# Patient Record
Sex: Male | Born: 1960
Health system: Southern US, Community
[De-identification: ages and names within clinical notes are randomized; demographics above are authoritative.]

## PROBLEM LIST (undated history)

## (undated) DIAGNOSIS — R188 Other ascites: Secondary | ICD-10-CM

## (undated) DIAGNOSIS — Z8619 Personal history of other infectious and parasitic diseases: Secondary | ICD-10-CM

## (undated) DIAGNOSIS — K449 Diaphragmatic hernia without obstruction or gangrene: Secondary | ICD-10-CM

## (undated) DIAGNOSIS — R0600 Dyspnea, unspecified: Secondary | ICD-10-CM

## (undated) DIAGNOSIS — I8500 Esophageal varices without bleeding: Secondary | ICD-10-CM

## (undated) DIAGNOSIS — K4090 Unilateral inguinal hernia, without obstruction or gangrene, not specified as recurrent: Secondary | ICD-10-CM

## (undated) DIAGNOSIS — F1010 Alcohol abuse, uncomplicated: Secondary | ICD-10-CM

## (undated) DIAGNOSIS — F1910 Other psychoactive substance abuse, uncomplicated: Secondary | ICD-10-CM

## (undated) DIAGNOSIS — F419 Anxiety disorder, unspecified: Secondary | ICD-10-CM

## (undated) DIAGNOSIS — M1990 Unspecified osteoarthritis, unspecified site: Secondary | ICD-10-CM

## (undated) DIAGNOSIS — F32A Depression, unspecified: Secondary | ICD-10-CM

## (undated) DIAGNOSIS — F329 Major depressive disorder, single episode, unspecified: Secondary | ICD-10-CM

## (undated) DIAGNOSIS — K3189 Other diseases of stomach and duodenum: Secondary | ICD-10-CM

## (undated) DIAGNOSIS — D696 Thrombocytopenia, unspecified: Secondary | ICD-10-CM

## (undated) DIAGNOSIS — K7460 Unspecified cirrhosis of liver: Secondary | ICD-10-CM

## (undated) DIAGNOSIS — K766 Portal hypertension: Secondary | ICD-10-CM

## (undated) DIAGNOSIS — I8290 Acute embolism and thrombosis of unspecified vein: Secondary | ICD-10-CM

## (undated) DIAGNOSIS — K922 Gastrointestinal hemorrhage, unspecified: Secondary | ICD-10-CM

## (undated) HISTORY — DX: Acute embolism and thrombosis of unspecified vein: I82.90

## (undated) HISTORY — DX: Major depressive disorder, single episode, unspecified: F32.9

## (undated) HISTORY — DX: Depression, unspecified: F32.A

## (undated) HISTORY — DX: Unspecified cirrhosis of liver: K74.60

## (undated) HISTORY — DX: Anxiety disorder, unspecified: F41.9

---
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
## 1999-01-10 ENCOUNTER — Encounter (HOSPITAL_COMMUNITY): Admission: RE | Admit: 1999-01-10 | Discharge: 1999-02-11 | Payer: Self-pay | Admitting: Psychiatry

## 2003-10-20 ENCOUNTER — Emergency Department (HOSPITAL_COMMUNITY): Admission: EM | Admit: 2003-10-20 | Discharge: 2003-10-20 | Payer: Self-pay | Admitting: *Deleted

## 2006-03-01 ENCOUNTER — Emergency Department (HOSPITAL_COMMUNITY): Admission: EM | Admit: 2006-03-01 | Discharge: 2006-03-02 | Payer: Self-pay | Admitting: Emergency Medicine

## 2006-03-02 ENCOUNTER — Emergency Department (HOSPITAL_COMMUNITY): Admission: EM | Admit: 2006-03-02 | Discharge: 2006-03-02 | Payer: Self-pay | Admitting: Emergency Medicine

## 2006-03-02 ENCOUNTER — Inpatient Hospital Stay (HOSPITAL_COMMUNITY): Admission: RE | Admit: 2006-03-02 | Discharge: 2006-03-06 | Payer: Self-pay | Admitting: Psychiatry

## 2006-03-03 ENCOUNTER — Ambulatory Visit: Payer: Self-pay | Admitting: Psychiatry

## 2009-04-08 ENCOUNTER — Inpatient Hospital Stay (HOSPITAL_COMMUNITY): Admission: EM | Admit: 2009-04-08 | Discharge: 2009-04-14 | Payer: Self-pay | Admitting: Internal Medicine

## 2009-04-08 ENCOUNTER — Ambulatory Visit: Payer: Self-pay | Admitting: Interventional Radiology

## 2009-04-08 ENCOUNTER — Encounter: Payer: Self-pay | Admitting: Emergency Medicine

## 2009-04-08 ENCOUNTER — Encounter: Payer: Self-pay | Admitting: Infectious Disease

## 2009-05-16 ENCOUNTER — Ambulatory Visit: Payer: Self-pay | Admitting: Internal Medicine

## 2011-02-03 LAB — URINE MICROSCOPIC-ADD ON

## 2011-02-03 LAB — COMPREHENSIVE METABOLIC PANEL
ALT: 195 U/L — ABNORMAL HIGH (ref 0–53)
ALT: 290 U/L — ABNORMAL HIGH (ref 0–53)
AST: 171 U/L — ABNORMAL HIGH (ref 0–37)
AST: 272 U/L — ABNORMAL HIGH (ref 0–37)
Albumin: 2.7 g/dL — ABNORMAL LOW (ref 3.5–5.2)
Albumin: 3 g/dL — ABNORMAL LOW (ref 3.5–5.2)
Alkaline Phosphatase: 278 U/L — ABNORMAL HIGH (ref 39–117)
Alkaline Phosphatase: 421 U/L — ABNORMAL HIGH (ref 39–117)
BUN: 9 mg/dL (ref 6–23)
BUN: 12 mg/dL (ref 6–23)
CO2: 22 mEq/L (ref 19–32)
Calcium: 8.3 mg/dL — ABNORMAL LOW (ref 8.4–10.5)
Calcium: 8.5 mg/dL (ref 8.4–10.5)
Calcium: 8.7 mg/dL (ref 8.4–10.5)
Chloride: 101 mEq/L (ref 96–112)
Creatinine, Ser: 0.36 mg/dL — ABNORMAL LOW (ref 0.4–1.5)
Creatinine, Ser: 0.42 mg/dL (ref 0.4–1.5)
Creatinine, Ser: 0.4 mg/dL (ref 0.4–1.5)
GFR calc Af Amer: >60 mL/min (ref >60)
GFR calc Af Amer: >60 mL/min (ref >60)
GFR calc non Af Amer: >60 mL/min (ref >60)
Glucose, Bld: 90 mg/dL (ref 70–99)
Glucose, Bld: 88 mg/dL (ref 70–99)
Potassium: 3.5 mEq/L (ref 3.5–5.1)
Potassium: 3.3 mEq/L — ABNORMAL LOW (ref 3.5–5.1)
Sodium: 132 mEq/L — ABNORMAL LOW (ref 135–145)
Sodium: 136 mEq/L (ref 135–145)
Total Bilirubin: 2.5 mg/dL — ABNORMAL HIGH (ref 0.3–1.2)
Total Protein: 6.6 g/dL (ref 6.0–8.3)
Total Protein: 7.5 g/dL (ref 6.0–8.3)
Total Protein: 8 g/dL (ref 6.0–8.3)

## 2011-02-03 LAB — BODY FLUID CELL COUNT WITH DIFFERENTIAL
Eos, Fluid: 1 %
Lymphs, Fluid: 25 %
Monocyte-Macrophage-Serous Fluid: 48 % — ABNORMAL LOW (ref 50–90)
Neutrophil Count, Fluid: 26 % — ABNORMAL HIGH (ref 0–25)
Total Nucleated Cell Count, Fluid: 84 cu mm (ref 0–1000)

## 2011-02-03 LAB — ANAEROBIC CULTURE

## 2011-02-03 LAB — GRAM STAIN

## 2011-02-03 LAB — DIFFERENTIAL
Basophils Absolute: 0 10*3/uL (ref 0.0–0.1)
Basophils Relative: 0 % (ref 0–1)
Basophils Relative: 0 % (ref 0–1)
Eosinophils Absolute: 0 10*3/uL (ref 0.0–0.7)
Eosinophils Relative: 0 % (ref 0–5)
Lymphocytes Relative: 7 % — ABNORMAL LOW (ref 12–46)
Lymphocytes Relative: 11 % — ABNORMAL LOW (ref 12–46)
Lymphs Abs: 0.8 10*3/uL (ref 0.7–4.0)
Monocytes Absolute: 1.4 10*3/uL — ABNORMAL HIGH (ref 0.1–1.0)
Monocytes Relative: 12 % (ref 3–12)
Monocytes Relative: 12 % (ref 3–12)
Neutro Abs: 9.5 10*3/uL — ABNORMAL HIGH (ref 1.7–7.7)
Neutro Abs: 5.1 10*3/uL (ref 1.7–7.7)
Neutrophils Relative %: 81 % — ABNORMAL HIGH (ref 43–77)
Neutrophils Relative %: 76 % (ref 43–77)

## 2011-02-03 LAB — PROTIME-INR
INR: 1.7 — ABNORMAL HIGH (ref 0.00–1.49)
Prothrombin Time: 20.3 seconds — ABNORMAL HIGH (ref 11.6–15.2)

## 2011-02-03 LAB — ALBUMIN, FLUID (OTHER): Albumin, Fluid: <1 g/dL

## 2011-02-03 LAB — FUNGUS CULTURE W SMEAR: Fungal Smear: NONE SEEN

## 2011-02-03 LAB — CBC
HCT: 39.8 % (ref 39.0–52.0)
HCT: 42.5 % (ref 39.0–52.0)
Hemoglobin: 13.6 g/dL (ref 13.0–17.0)
Hemoglobin: 14.3 g/dL (ref 13.0–17.0)
MCHC: 34.1 g/dL (ref 30.0–36.0)
MCHC: 34.1 g/dL (ref 30.0–36.0)
MCHC: 33.6 g/dL (ref 30.0–36.0)
MCV: 93.1 fL (ref 78.0–100.0)
MCV: 94.1 fL (ref 78.0–100.0)
MCV: 93.1 fL (ref 78.0–100.0)
Platelets: 138 10*3/uL — ABNORMAL LOW (ref 150–400)
Platelets: 96 10*3/uL — ABNORMAL LOW (ref 150–400)
RBC: 4.28 MIL/uL (ref 4.22–5.81)
RDW: 17.1 % — ABNORMAL HIGH (ref 11.5–15.5)
RDW: 16.3 % — ABNORMAL HIGH (ref 11.5–15.5)
WBC: 11.7 10*3/uL — ABNORMAL HIGH (ref 4.0–10.5)
WBC: 6.1 10*3/uL (ref 4.0–10.5)

## 2011-02-03 LAB — AFB CULTURE WITH SMEAR (NOT AT ARMC): Acid Fast Smear: NONE SEEN

## 2011-02-03 LAB — APTT: aPTT: 44 seconds — ABNORMAL HIGH (ref 24–37)

## 2011-02-03 LAB — BODY FLUID CULTURE: Culture: NO GROWTH

## 2011-02-03 LAB — URINALYSIS, ROUTINE W REFLEX MICROSCOPIC
Hgb urine dipstick: NEGATIVE
Nitrite: NEGATIVE
Specific Gravity, Urine: 1.032 — ABNORMAL HIGH (ref 1.005–1.030)
Urobilinogen, UA: 1 mg/dL (ref 0.0–1.0)
pH: 6 (ref 5.0–8.0)

## 2011-02-03 LAB — PROTEIN, BODY FLUID: Total protein, fluid: <3 g/dL

## 2011-02-03 LAB — PATHOLOGIST SMEAR REVIEW

## 2011-02-03 LAB — LACTATE DEHYDROGENASE, PLEURAL OR PERITONEAL FLUID: LD, Fluid: 59 U/L — ABNORMAL HIGH (ref 3–23)

## 2011-02-03 LAB — HEPATITIS PANEL, ACUTE
HCV Ab: NEGATIVE
Hep A IgM: NEGATIVE
Hep B C IgM: NEGATIVE
Hepatitis B Surface Ag: NEGATIVE

## 2011-02-03 LAB — AFP TUMOR MARKER: AFP-Tumor Marker: 3.8 ng/mL (ref 0.0–8.0)

## 2011-02-03 LAB — AMMONIA: Ammonia: 35 umol/L (ref 11–35)

## 2011-02-03 LAB — GLUCOSE, SEROUS FLUID: Glucose, Fluid: 94 mg/dL

## 2011-02-03 LAB — HIV ANTIBODY (ROUTINE TESTING W REFLEX): HIV: NONREACTIVE

## 2011-03-11 NOTE — Discharge Summary (Signed)
NAMETYLON, KEMMERLING NO.:  [id number]   MEDICAL RECORD NO.:  [id number]          PATIENT TYPE:  INP   LOCATION:  5125                         FACILITY:  [facility]   PHYSICIAN:  Ruthy Dick, MD    DATE OF BIRTH:  [birth year]   DATE OF ADMISSION:  04/08/2009  DATE OF DISCHARGE:  04/14/2009                               DISCHARGE SUMMARY   REASON FOR ADMISSION:  Abdominal distention and weakness.   FINAL DISCHARGE DIAGNOSES:  1. Symptomatic ascites secondary to cirrhosis.  2. Cirrhosis of the liver, decompensated.  3. Alcohol abuse.  4. Tobacco abuse.  5. Depression.  6. Grade 2 esophageal varices.   CONSULT DURING THIS ADMISSION:  Gastroenterology consult with Dr. Loreta Ave.   PROCEDURES DONE DURING THIS ADMISSION:  1. Abdominal paracentesis x3 with a total of 14.5 L removed on 3      different occasions.  No evidence of spontaneous bacterial      peritonitis noted in the fluid.  2. Abdominal ultrasound, which was read as having showing cirrhosis      with a primary evaluation not being able to be excluded in any      case, alpha-fetoprotein was obtained, which showed no increase.      Gallbladder wall thickening was said to be related to cirrhosis.      No stones seen.  3. Second-degree varices was noted on EGD.   BRIEF HISTORY OF PRESENT ILLNESS AND HOSPITAL COURSE:  This is a 50-year-  old Caucasian gentleman with a history of significant alcoholism, who  came into the hospital with generalized weakness and abdominal  distention.  He was noted to be hypoxic on room air, requiring 4 L via  nasal cannula to keep his oxygen well.  Because of this, he was admitted  and managed with therapeutic paracentesis.  As noted above on 3  different occasions, this was done on the night of admission, he does  have a liter taken out by the admitting physician.  Later on, on June  14, he had about 8 liters removed and on June 15, a repeat paracentesis  yielded 6 L.  After  this procedure, the patient had significant relief,  and his abdomen became soft.  Discharging the patient became a problem  as the patient required assistance at home or in the skilled nursing  facility.  The patient did not qualify for skilled nursing facility  placement, and the family is very reluctant to take him back.  After  going on for a few days, the mother and father came over today and told  me that they are willing to take him back home without the help of home  health.  The patient stated that he is doing very well.  He is able to  get around with physical therapy and with minimal assistance.  He says  he has no complaint whatsoever today, no chest pain, no shortness of  breath, no abdominal pain, no nausea, no vomiting, no diarrhea, and no  constipation.  We are going to decrease the dose of diuretic a little  bit since his blood pressure was 92/62 this morning.   PHYSICAL EXAMINATION:  In a nutshell his vitals today are temperature  98.1, pulse 76, respirations 18, blood pressure 92/52, and saturating  97% on room air.  CHEST:  Clear to auscultation bilaterally.  ABDOMEN:  Soft and nontender.  EXTREMITIES:  No clubbing, no cyanosis, and no edema.  CARDIOVASCULAR:  Some first and second heart sounds only.  CENTRAL NERVOUS SYSTEM:  Nonfocal.   The patient has been advised strongly to quit both alcohol and tobacco  abuse, and he voices understanding.   DISCHARGE MEDICATIONS:  1. Zoloft 100 mg p.o. daily.  2. Lasix 40 mg p.o. daily.  3. Lactulose 30 mL p.o. daily.  4. Nadolol 20 mg p.o. daily.  5. Aldactone 50 mg p.o. daily.   Followup for this patient is being arranged by this Social Service.  Because of this, we would not been able to make any free calls to the  nurse, to [facility], or to [facility].  The patient's  preference actually is to go to [facility], but they are  closed during this weekend and that call cannot be made.   Because of  this, we are going to provide the patient with the phone number to both  clinics, and we will try and see if our social workers would follow up  with also trying to make calls for this patient.  But the primary  assignment at this time as discussed with the patient and as instructed  is for the patient to call both [facility] or the [facility] to  make appointments on Monday.  The appointment in either place should be  in 2-3 weeks.  It is recommended that the patient go home with home  health with physical therapy, occupational therapy, and a registered  nurse, who will check the patient's blood pressures and evaluate him  generally.   Time used for discharge planning is greater than 30 minutes.      Ruthy Dick, MD  Electronically Signed     GU/MEDQ  D:  04/14/2009  T:  04/15/2009  Job:  [id number]   cc:   [facility]

## 2011-03-11 NOTE — H&P (Signed)
Kenneth [name], Kenneth [name] NO.:  [id number]   MEDICAL RECORD NO.:  [id number]          [name] TYPE:  INP   LOCATION:  5125                         FACILITY:  [facility]   PHYSICIAN:  Acey Lav, MD  DATE OF BIRTH:  April 05, 1961   DATE OF ADMISSION:  04/08/2009  DATE OF DISCHARGE:                              HISTORY & PHYSICAL   CHIEF COMPLAINTS:  Abdominal distention and weakness.   HISTORY OF PRESENT ILLNESS:  Kenneth [name] is a 50 year old Caucasian  gentleman with a past medical history significant for alcoholism and  cirrhosis of Kenneth liver having been followed by a physician in an Urgent  Care.  He has had abdominal distention now for several years he states.  He has never had fluid removed for analysis and never had spontaneous  bacterial peritonitis as far as he knows.  He did stop drinking alcohol  approximately 8 days ago and apparently went through alcohol withdrawal  having suffered some hallucinations.  He claims not to have drunk  recently.  He was too weak to get out of his bed today in his house  where he lives with his wife and children and was brought with family's  encouragement to [facility] emergency room.  In Kenneth  emergency room he was found to be hypoxic on room air requiring 4 liters  via nasal cannula to keep him oxygenating well.  He had massive ascites  and his chest x-ray showed low lung volumes with bilateral atelectasis  and large ascites and likely also pleural fluids.  His labs were  reviewed and are discussed below.  Arrangements were made to have him  transferred to [facility] to be admitted to [facility] for management of his massive ascites and cirrhosis of Kenneth  liver.   PAST MEDICAL HISTORY:  1. Alcoholism.  2. Cirrhosis secondary to alcoholism.  3. Depression.   PAST SURGICAL HISTORY:  None.   FAMILY HISTORY:  Noncontributory.   SOCIAL HISTORY:  Kenneth [name] was a heavy drinker, only  quit drinking  approximately 7 days ago.  He also smokes tobacco.  He denies other  recreational drug use.  He is married, has a child.   REVIEW OF SYSTEMS:  Described in Kenneth history of present illness,  otherwise 10-point review of systems negative.  Specifically negative  for fevers, chills and weight loss.  He has a weight gain as described  above.   PHYSICAL EXAMINATION:  Blood pressure on arrival at [facility] floor  was 123/80, respirations 18, pulse 98, temperature 98, pulse ox was 91%  on 4 liters via nasal cannula.  Weight was 73 kg.  GENERAL:  Kenneth [name] has wasting of his facial muscles and of muscles  of his arms and legs.  He has massively distended abdomen obvious on  exam.  He is alert and oriented x4.  HEENT: As mentioned he has loss of muscles and loss of fat in his face.  Pupils equal, round, react to light.  Sclerae anicteric.  Oropharynx:  His tongue has slight coating  to it.  There is no erythema in Kenneth  posterior oropharynx.  NECK:  Supple.  CARDIOVASCULAR:  Regular rate and rhythm.  No murmurs, gallops or rubs.  He was slight tachycardia.  LUNGS:  Diminished breath sounds at Kenneth bases.  ABDOMEN:  Massively distended.  Shifting dullness to percussion with a  fluid wave.  LOWER EXTREMITIES:  With 2+ peripheral edema.  NEUROLOGIC EXAM:  Cranial nerves were grossly intact.  His strength in  upper extremities 5/5, lower extremities 4/5.   LABORATORY DATA:  Chest x-ray showing atelectasis bilaterally suspicious  for pleural effusion, also likely due to massive ascites.  Ammonia level 35, PTT 46.  PT 19.5.  Urinalysis:  Specific gravity  1.032, glucose negative, bilirubin moderate, 40+ ketones, negative  blood, 30 protein, negative nitrite and leukocyte esterase.  CMP:  Sodium 137, potassium 3.3, chloride 103, bicarb 28, BUN and creatinine  were 12 and 0.4, glucose 88, bilirubin 2.2, alkaline phosphatase 421,  AST, ALT were 468 and 428.  Albumin was 3.4.  CBC  with differential:  White count 6.7, hemoglobin 14.3, platelets 158, ANC was 5.1.   ASSESSMENT/PLAN:  This is a 50 year old Caucasian gentleman with  alcoholic cirrhosis of Kenneth liver with massive ascites.  1. Massive ascites:  I performed a paracentesis at Kenneth bedside as      described below in procedure note.  Fluid is being sent for cell      count, differential, LDH, albumin, protein, stat Gram stain,      culture, anaerobic culture, AFB culture, fungal culture, cytology.      Should he have evidence of SBP by members, I will start him on      ceftriaxone.  I have talked to Dr. Aurther Loft with Gastroenterology and      Dr. Elnoria Howard will see Kenneth [name] tomorrow.  Kenneth [name] will likely be      started on Aldactone and Lasix but I will not do this tonight nor      tomorrow until after I have gotten some fluid off him.  I am going      to give him 4 grams of albumin since I removed approximately 500 mL      of fluid.  See below.  2. Cirrhosis of Kenneth liver.  I will check an alpha fetoprotein.  We      will check an ultrasound of Kenneth abdomen.  It is essential Kenneth      [name] stay clean from alcohol.  3. Alcoholism.  I will put him on CIWA protocol for possible      withdrawal.  I will check an alcohol level.  I will give him a      banana bag of vitamins.  I may give them orally actually since he      can swallow fine.  4. Depression.  I will continue him on Kenneth sertraline.  5. Prophylax Kenneth [name] on heparin.   PROCEDURE NOTE:  Procedure:  Informed consent was performed.  Risks and  benefits of Kenneth procedure were explained to Kenneth [name].  Abdomen was  percussed to determine area of ascitic fluid after positioning [name]  at 45 degrees and then put up even to about 60 degrees seated.  Area was  prepped and draped in Kenneth usual sterile manner.  Lidocaine was  infiltrated into Kenneth skin using small needle, then larger needle used to  infiltrate lidocaine deeper.  Small 18-gauge syringe used to  aspirate  for fluid and ascitic  fluid was obtained and sent for cell count  differential and Kenneth above-listed studies.  Initially hooked up this  tubing to vacuum to try and remove fluid but had difficulty removing  more than 250 mL in Kenneth first draw.  Then used larger Finder needle to  obtain further fluid and obtained approximately in total 500 mL of fluid  with three different approaches in Kenneth midline area just approximately 4  or 5 cm below Kenneth umbilicus.  Estimated blood loss:  Minimal.  Complications:  None.  Fluid was sent for Kenneth studies described above.     Acey Lav, MD  Electronically Signed    CV/MEDQ  D:  04/08/2009  T:  04/08/2009  Job:  [id number]   cc:   Jordan Hawks. Elnoria Howard, MD

## 2011-03-11 NOTE — Consult Note (Signed)
NAMEEASON, HOUSMAN NO.:  [id number]   MEDICAL RECORD NO.:  [id number]          PATIENT TYPE:  INP   LOCATION:  5125                         FACILITY:  [facility]   PHYSICIAN:  Anselmo Rod, M.D.  DATE OF BIRTH:  [birth year]   DATE OF CONSULTATION:  04/09/2009  DATE OF DISCHARGE:                                 CONSULTATION   Patient is unassigned.   REASON FOR CONSULTATION:  Ascites, ?Hepatic encephalopathy.   ASSESSMENT:  1. End-stage liver disease secondary to alcohol abuse with hepatic      encephalopathy, which seems to be improving.  2. Massive ascites, status post paracentesis of 80 liters of fluid      today.  3. Depression.  4. Malnutrition.  5. Fatty liver.  6. Gallbladder/sludge versus stones.   RECOMMENDATIONS:  1. Check pre-albumin levels.  2. Alcohol rehab.  3. EGD to rule out varices.  4. A low 2 gm sodium.  5. Diuretics to be titrated to the patient's response.   DISCUSSION:  Mr. Prudencio Velazco is a 50 year old white male admitted to  [facility] to the [facility] on 04/08/09 with  generalized abdominal distention and weakness.  He has a longstanding  history of alcoholism with cirrhosis of the liver referred by a  physician at the urgent care.  He had worsening abdominal distention for  several years but as his symptoms deteriorated over the last week, he  presented to the hospital over the weekend for further evaluation.  He  was seen at [facility] and then transferred to [facility] for treatment and followup.   PAST MEDICAL HISTORY:  See list above.   PAST SURGICAL HISTORY:  Patient has not had any surgeries in the past.   ALLERGIES:  No known drug allergies.   MEDICATIONS IN THE HOSPITAL:  1. Lactulose.  2. Ativan p.r.n., which is presently on hold.  3. KCL supplements.  4. Aldactone.  5. Vitamin B1.  6. Multivitamins.   SOCIAL HISTORY:  He is unemployed.  He has been drinking  heavily.  He  cannot quantify for me as to the amount of alcohol he has had in the  recent past.  He stopped drinking approximately 7 days ago.  He also  smokes cigarettes.  He denies the use of recreational drugs.  He is  married with 1 child.  He is presently unemployed.  He used to work as a  [employer]al architect.   FAMILY HISTORY:  Noncontributory.   REVIEW OF SYSTEMS:  1. Depression.  2. Abdominal distention.  3. Weakness and lethargy.  4. No nausea, vomiting, diarrhea, or constipation.   PHYSICAL EXAMINATION:  A middle-aged white male who looks older than his  stated age in no acute distress, sitting on the side of the bed, over  his dinner plate, not eating much.  Temperature 98.6, blood pressure 105/59, respiratory rate 20 per minute,  pulse 99 per minute.  HEENT:  Atraumatic and normocephalic head.  NECK:  Supple.  CHEST:  Clear to auscultation.  HEART:  S1 and S2  regular.  ABDOMEN:  Soft, nondistended, nontender, with decreased bowel sounds.  No hepatosplenomegaly.  There is mild edema of the extremities.   Laboratory evaluation revealed white count of 11.7 with a hemoglobin of  13.6 and platelet count of 138.  PT is elevated at 1.7 with a PTT of  20.3.  Sodium 132, potassium 3.5, chloride 101, CO2 22, glucose 90, BUN  9, creatinine 0.4, total bilirubin 2.5, alkaline phosphatase 278.  AST  272.  ALT 290.  Total protein 7.5.  Albumin 2.7.  Calcium 8.3.  The  sciatic fluid consists of a few benign mesothelial cells and  inflammatory background.  AFB studies are negative.  There is no yeast  or fungus noted.  Alpha fetoprotein level is 3.8 ng/ml with normal being  from 0 to 8.0.  Ammonia level drawn yesterday was normal at 35 mm/liter.   Ultrasound of the abdomen done earlier today revealed some sludge in the  gallbladder versus stones and splenomegaly.  There is a moderate amount  of ascites.  There is gallbladder thickening noted.   PLAN:  As above.  Further recommendations  to be made in followup.      Anselmo Rod, M.D.  Electronically Signed     JNM/MEDQ  D:  04/09/2009  T:  04/09/2009  Job:  [id number]

## 2011-03-14 NOTE — Discharge Summary (Signed)
[name], Kenneth NO.:  [id number]   MEDICAL RECORD NO.:  [id number]          PATIENT TYPE:  IPS   LOCATION:  0305                          [facility]ACILITY[facility]  [facility]   PHYSICIAN:  Anselm Jungling, MD  DATE OF BIRTH:  Dec 17, 1960   DATE OF ADMISSION:  03/02/2006  DATE OF DISCHARGE:  03/06/2006                                 DISCHARGE SUMMARY   IDENTIFYING DATA/REASON FOR ADMISSION:  The patient is a 50 year old  separated white male admitted for alcohol detoxification.  He had had  inpatient chemical dependency treatment at [facility] in the year 2000,  but remained sober only five weeks.  At this time, his primary care  physician urged him into alcohol detoxification and rehabilitation again.  He came to [place] without any significant psychiatric history.  Please refer to  the admission note for further details pertaining to the symptoms,  circumstances and history that led to his hospitalization.   INITIAL DIAGNOSTIC IMPRESSION:  He was given an initial AXIS I diagnosis of  alcohol dependence.   MEDICAL/LABORATORY:  The patient was medically and physically assessed by  the psychiatric nurse practitioner.  For symptoms of GERD, he was given  Protonix 40 mg daily.  There were no significant medical issues during this  brief inpatient psychiatric stay.   HOSPITAL COURSE:  The patient was admitted to the adult inpatient  psychiatric service and placed on a Librium withdrawal protocol.  He  tolerated this well, and had minimal symptoms during his stay.  He was a  good participant in the treatment program and attended therapeutic groups  and activities, including AA meetings.   A family meeting was offered the patient, but he indicated that he preferred  not to do this, as he saw his family as being the root of my problems.   At the time of discharge, the patient was not tremulous, was on minimal  doses of Librium, and indicated a strong commitment to attending AA  regular  meetings.   AFTERCARE:  The patient was to follow-up with Alcoholics Anonymous.  He had  met individuals from a local [place] [facility] and had made  contacts with them during his inpatient stay.   DISCHARGE MEDICATIONS:  He had been started on a trial of Campral 666 mg  t.i.d. which was well-tolerated.  He was given a 30-day prescription for  this.   FOLLOW UP:  He was to follow-up with his usual medical physician, his  primary care doctor regarding continued follow-up of Campral therapy.   DISCHARGE DIAGNOSES:  AXIS I:  Alcohol dependence, early remission.  AXIS II:  Deferred.  AXIS III:  History of gastroesophageal reflux disease.  AXIS IV:  Stressors:  Severe.  AXIS V:  GAF on discharge 65.           ______________________________  Anselm Jungling, MD  Electronically Signed     SPB/MEDQ  D:  03/06/2006  T:  03/07/2006  Job:  [id number]

## 2011-11-21 ENCOUNTER — Ambulatory Visit: Payer: Self-pay

## 2011-11-21 DIAGNOSIS — I10 Essential (primary) hypertension: Secondary | ICD-10-CM

## 2011-11-21 DIAGNOSIS — F1010 Alcohol abuse, uncomplicated: Secondary | ICD-10-CM

## 2011-12-23 ENCOUNTER — Encounter: Payer: Self-pay | Admitting: Physician Assistant

## 2011-12-23 ENCOUNTER — Telehealth: Payer: Self-pay

## 2011-12-23 NOTE — Telephone Encounter (Signed)
Called pt who stated letter should be fine for company instead of release form. Letter had been written and left to be faxed earlier today, but re-faxed letter and received confirmation that went through.

## 2011-12-23 NOTE — Telephone Encounter (Signed)
Below is an Armed forces technical officer the patient sent through our website.  The following email was submitted to your website from [place] a. 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  [phone].  This is for employment with [employer], [place], [place].  This needs to be done by 12pm on tuesday feb.26, 2013 to secure my employment.  Please call me early a.m. if there is any problem or delay with this request.  thank you, Kenneth [name]. [phone] Date of birth: 1961/04/20

## 2011-12-23 NOTE — Telephone Encounter (Signed)
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 Needs release ASAP, works for [employer] and is getting assignments at [employer].

## 2011-12-23 NOTE — Telephone Encounter (Signed)
Benny Lennert wrote letter, will fax

## 2012-01-15 ENCOUNTER — Ambulatory Visit: Payer: Self-pay | Admitting: Emergency Medicine

## 2012-01-15 VITALS — BP 133/84 | HR 60 | Temp 98.8°F | Resp 16 | Ht 67.0 in | Wt 186.0 lb

## 2012-01-15 DIAGNOSIS — R5383 Other fatigue: Secondary | ICD-10-CM

## 2012-01-15 DIAGNOSIS — R5381 Other malaise: Secondary | ICD-10-CM

## 2012-01-15 DIAGNOSIS — G4710 Hypersomnia, unspecified: Secondary | ICD-10-CM

## 2012-01-15 LAB — POCT CBC
MCH, POC: 31.2 pg (ref 27–31.2)
MCV: 93.7 fL (ref 80–97)
MID (cbc): 0.8 (ref 0–0.9)
POC LYMPH PERCENT: 16.9 %L (ref 10–50)
Platelet Count, POC: 106 10*3/uL — AB (ref 142–424)
RDW, POC: 15 %
WBC: 8.7 10*3/uL (ref 4.6–10.2)
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
## 2012-01-16 ENCOUNTER — Telehealth: Payer: Self-pay

## 2012-01-16 LAB — COMPREHENSIVE METABOLIC PANEL
CO2: 27 mEq/L (ref 19–32)
Creat: 0.76 mg/dL (ref 0.50–1.35)
Glucose, Bld: 89 mg/dL (ref 70–99)
Total Bilirubin: 0.7 mg/dL (ref 0.3–1.2)
Total Protein: 6.9 g/dL (ref 6.0–8.3)

## 2012-01-16 NOTE — Telephone Encounter (Signed)
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 WOULD LIKE TO HAVE [place] FAX TO [phone] ATTN: LYNN AND YOU MAY REACH PT AT [phone]

## 2012-01-17 NOTE — Telephone Encounter (Signed)
Advised pt that letter was faxed

## 2012-01-21 ENCOUNTER — Telehealth: Payer: Self-pay | Admitting: Family Medicine

## 2012-01-21 NOTE — Telephone Encounter (Signed)
Called patient per Dr. Deforest Hoyles request and [facility].    He would like patient to come in for EKG only at no charge.  Dr. Cleta Alberts wanted to rule out cause of his symptoms being related to Celexa dose.  Please let Dr. Cleta Alberts know if patient is willing to come in and he will put in order.

## 2012-01-23 NOTE — Telephone Encounter (Signed)
LMOM to CB. 

## 2012-01-26 NOTE — Telephone Encounter (Signed)
LMOM to RTC for EKG per Dr. Deforest Hoyles request

## 2012-01-28 ENCOUNTER — Ambulatory Visit (INDEPENDENT_AMBULATORY_CARE_PROVIDER_SITE_OTHER): Payer: Self-pay | Admitting: Emergency Medicine

## 2012-01-28 VITALS — BP 112/73 | HR 65 | Temp 98.0°F | Resp 16 | Ht 67.0 in | Wt 182.4 lb

## 2012-01-28 DIAGNOSIS — G4710 Hypersomnia, unspecified: Secondary | ICD-10-CM

## 2012-01-28 NOTE — Progress Notes (Signed)
  Subjective:    Patient ID: Kenneth [name], male    DOB: 1961/01/21, 51 y.o.   MRN: [id number]  HPI patient called and requested to come in for an EKG. He was asked to come in because he is on Celexa and has been having narcolepsy-type symptoms    Review of Systems     Objective:   Physical Exam  EKG shows a normal sinus rhythm with no evidence of QT prolongation      Assessment & Plan:  Advised to proceed with workup for narcolepsy

## 2012-03-06 ENCOUNTER — Telehealth: Payer: Self-pay

## 2012-03-06 NOTE — Telephone Encounter (Signed)
THIS MESSAGE PROBABLY NEEDS TO GO TO MAUDIA  PT SAYS THAT PPW WAS SENT OVER FROM HIS JOB TO COMPLETE FOR HIS BEING OUT OF WORK. HE CAME BY AND SIGNED A RELEASE. HE WANTS TO KNOW THE STATUS OF HIS PPW. HE WORKS FOR [employer].   [phone]

## 2012-05-27 ENCOUNTER — Other Ambulatory Visit: Payer: Self-pay | Admitting: Family Medicine

## 2012-09-17 ENCOUNTER — Telehealth: Payer: Self-pay

## 2012-09-17 NOTE — Telephone Encounter (Signed)
He has to see the sleep specialist first.

## 2012-09-17 NOTE — Telephone Encounter (Signed)
PT WOULD LIKE TO KNOW IF DR.DAUB WOULD APPROVE FOR HIM TO HAVE A 3 MONTH DOT CARD INSTEAD OF DENYING IT ALL TOGETHER. BEST# [phone]

## 2012-09-18 NOTE — Telephone Encounter (Signed)
Left message for patient to return call.

## 2012-09-20 NOTE — Telephone Encounter (Signed)
Pt called looking for status, left message in billing voicemail in error. Forwarding to clinical pool

## 2012-09-20 NOTE — Telephone Encounter (Signed)
LMOM to CB. 

## 2012-09-21 NOTE — Telephone Encounter (Signed)
Patient advised.

## 2012-12-14 ENCOUNTER — Other Ambulatory Visit: Payer: Self-pay | Admitting: Family Medicine

## 2014-08-31 ENCOUNTER — Emergency Department (HOSPITAL_COMMUNITY): Payer: Self-pay

## 2014-08-31 ENCOUNTER — Inpatient Hospital Stay (HOSPITAL_COMMUNITY)
Admission: EM | Admit: 2014-08-31 | Discharge: 2014-09-03 | DRG: 369 | Disposition: A | Discharge status: Home / Self Care | Payer: Self-pay | Attending: Internal Medicine | Admitting: Internal Medicine

## 2014-08-31 ENCOUNTER — Encounter (HOSPITAL_COMMUNITY): Payer: Self-pay | Admitting: *Deleted

## 2014-08-31 DIAGNOSIS — D72819 Decreased white blood cell count, unspecified: Secondary | ICD-10-CM | POA: Diagnosis present

## 2014-08-31 DIAGNOSIS — R7989 Other specified abnormal findings of blood chemistry: Secondary | ICD-10-CM | POA: Diagnosis present

## 2014-08-31 DIAGNOSIS — I8501 Esophageal varices with bleeding: Principal | ICD-10-CM | POA: Diagnosis present

## 2014-08-31 DIAGNOSIS — F17210 Nicotine dependence, cigarettes, uncomplicated: Secondary | ICD-10-CM | POA: Diagnosis present

## 2014-08-31 DIAGNOSIS — K7030 Alcoholic cirrhosis of liver without ascites: Secondary | ICD-10-CM

## 2014-08-31 DIAGNOSIS — R000 Tachycardia, unspecified: Secondary | ICD-10-CM | POA: Diagnosis present

## 2014-08-31 DIAGNOSIS — I8500 Esophageal varices without bleeding: Secondary | ICD-10-CM | POA: Diagnosis present

## 2014-08-31 DIAGNOSIS — D696 Thrombocytopenia, unspecified: Secondary | ICD-10-CM | POA: Diagnosis present

## 2014-08-31 DIAGNOSIS — Z79899 Other long term (current) drug therapy: Secondary | ICD-10-CM

## 2014-08-31 DIAGNOSIS — F10939 Alcohol use, unspecified with withdrawal, unspecified: Secondary | ICD-10-CM

## 2014-08-31 DIAGNOSIS — K766 Portal hypertension: Secondary | ICD-10-CM | POA: Diagnosis present

## 2014-08-31 DIAGNOSIS — I959 Hypotension, unspecified: Secondary | ICD-10-CM | POA: Diagnosis present

## 2014-08-31 DIAGNOSIS — R109 Unspecified abdominal pain: Secondary | ICD-10-CM

## 2014-08-31 DIAGNOSIS — F1290 Cannabis use, unspecified, uncomplicated: Secondary | ICD-10-CM | POA: Diagnosis present

## 2014-08-31 DIAGNOSIS — K449 Diaphragmatic hernia without obstruction or gangrene: Secondary | ICD-10-CM | POA: Diagnosis present

## 2014-08-31 DIAGNOSIS — K922 Gastrointestinal hemorrhage, unspecified: Secondary | ICD-10-CM

## 2014-08-31 DIAGNOSIS — F10239 Alcohol dependence with withdrawal, unspecified: Secondary | ICD-10-CM | POA: Diagnosis present

## 2014-08-31 DIAGNOSIS — F1010 Alcohol abuse, uncomplicated: Secondary | ICD-10-CM | POA: Diagnosis present

## 2014-08-31 DIAGNOSIS — D62 Acute posthemorrhagic anemia: Secondary | ICD-10-CM | POA: Diagnosis present

## 2014-08-31 DIAGNOSIS — K8020 Calculus of gallbladder without cholecystitis without obstruction: Secondary | ICD-10-CM | POA: Diagnosis present

## 2014-08-31 DIAGNOSIS — K7031 Alcoholic cirrhosis of liver with ascites: Secondary | ICD-10-CM | POA: Insufficient documentation

## 2014-08-31 DIAGNOSIS — K3189 Other diseases of stomach and duodenum: Secondary | ICD-10-CM | POA: Diagnosis present

## 2014-08-31 HISTORY — DX: Other psychoactive substance abuse, uncomplicated: F19.10

## 2014-08-31 HISTORY — DX: Other diseases of stomach and duodenum: K31.89

## 2014-08-31 HISTORY — DX: Gastrointestinal hemorrhage, unspecified: K92.2

## 2014-08-31 HISTORY — DX: Thrombocytopenia, unspecified: D69.6

## 2014-08-31 HISTORY — DX: Alcohol abuse, uncomplicated: F10.10

## 2014-08-31 HISTORY — DX: Esophageal varices without bleeding: I85.00

## 2014-08-31 HISTORY — DX: Personal history of other infectious and parasitic diseases: Z86.19

## 2014-08-31 HISTORY — DX: Diaphragmatic hernia without obstruction or gangrene: K44.9

## 2014-08-31 HISTORY — DX: Portal hypertension: K76.6

## 2014-08-31 LAB — COMPREHENSIVE METABOLIC PANEL
ALBUMIN: 2.8 g/dL — AB (ref 3.5–5.2)
ALT: 43 U/L (ref 0–53)
AST: 103 U/L — AB (ref 0–37)
Alkaline Phosphatase: 109 U/L (ref 39–117)
Anion gap: 12 (ref 5–15)
BUN: 18 mg/dL (ref 6–23)
CALCIUM: 7.9 mg/dL — AB (ref 8.4–10.5)
CHLORIDE: 107 meq/L (ref 96–112)
CO2: 23 mEq/L (ref 19–32)
CREATININE: 0.41 mg/dL — AB (ref 0.50–1.35)
GFR calc Af Amer: >90 mL/min (ref >90)
GFR calc non Af Amer: >90 mL/min (ref >90)
Glucose, Bld: 123 mg/dL — ABNORMAL HIGH (ref 70–99)
Potassium: 4.5 mEq/L (ref 3.7–5.3)
Sodium: 142 mEq/L (ref 137–147)
TOTAL PROTEIN: 6.3 g/dL (ref 6.0–8.3)
Total Bilirubin: 1.2 mg/dL (ref 0.3–1.2)

## 2014-08-31 LAB — BASIC METABOLIC PANEL
Anion gap: 20 — ABNORMAL HIGH (ref 5–15)
BUN: 20 mg/dL (ref 6–23)
CALCIUM: 8.6 mg/dL (ref 8.4–10.5)
CO2: 20 mEq/L (ref 19–32)
Chloride: 101 mEq/L (ref 96–112)
Creatinine, Ser: 0.41 mg/dL — ABNORMAL LOW (ref 0.50–1.35)
GLUCOSE: 124 mg/dL — AB (ref 70–99)
POTASSIUM: 4 meq/L (ref 3.7–5.3)
SODIUM: 141 meq/L (ref 137–147)

## 2014-08-31 LAB — CBC WITH DIFFERENTIAL/PLATELET
BASOS ABS: 0 10*3/uL (ref 0.0–0.1)
Basophils Relative: 0 % (ref 0–1)
EOS ABS: 0 10*3/uL (ref 0.0–0.7)
Eosinophils Relative: 0 % (ref 0–5)
HCT: 26.2 % — ABNORMAL LOW (ref 39.0–52.0)
Hemoglobin: 8.9 g/dL — ABNORMAL LOW (ref 13.0–17.0)
LYMPHS ABS: 0.4 10*3/uL — AB (ref 0.7–4.0)
Lymphocytes Relative: 10 % — ABNORMAL LOW (ref 12–46)
MCH: 35 pg — ABNORMAL HIGH (ref 26.0–34.0)
MCHC: 34 g/dL (ref 30.0–36.0)
MCV: 103.1 fL — AB (ref 78.0–100.0)
MONO ABS: 0.6 10*3/uL (ref 0.1–1.0)
MONOS PCT: 16 % — AB (ref 3–12)
Neutro Abs: 2.8 10*3/uL (ref 1.7–7.7)
Neutrophils Relative %: 74 % (ref 43–77)
PLATELETS: 31 10*3/uL — AB (ref 150–400)
RBC: 2.54 MIL/uL — ABNORMAL LOW (ref 4.22–5.81)
RDW: 12.3 % (ref 11.5–15.5)
WBC: 3.8 10*3/uL — AB (ref 4.0–10.5)

## 2014-08-31 LAB — PROTIME-INR
INR: 1.38 (ref 0.00–1.49)
INR: 1.48 (ref 0.00–1.49)
Prothrombin Time: 17.1 seconds — ABNORMAL HIGH (ref 11.6–15.2)
Prothrombin Time: 18.1 seconds — ABNORMAL HIGH (ref 11.6–15.2)

## 2014-08-31 LAB — ETHANOL: ALCOHOL ETHYL (B): 38 mg/dL — AB (ref 0–11)

## 2014-08-31 LAB — TYPE AND SCREEN
ABO/RH(D): A NEG
ANTIBODY SCREEN: NEGATIVE

## 2014-08-31 LAB — TROPONIN I: Troponin I: <0.3 ng/mL (ref <0.30)

## 2014-08-31 LAB — POC OCCULT BLOOD, ED: Fecal Occult Bld: POSITIVE — AB

## 2014-08-31 LAB — MAGNESIUM: Magnesium: 2 mg/dL (ref 1.5–2.5)

## 2014-08-31 LAB — HEPATIC FUNCTION PANEL
ALBUMIN: 3.2 g/dL — AB (ref 3.5–5.2)
ALT: 51 U/L (ref 0–53)
AST: 120 U/L — AB (ref 0–37)
Alkaline Phosphatase: 139 U/L — ABNORMAL HIGH (ref 39–117)
BILIRUBIN DIRECT: 0.4 mg/dL — AB (ref 0.0–0.3)
Indirect Bilirubin: 0.7 mg/dL (ref 0.3–0.9)
Total Bilirubin: 1.1 mg/dL (ref 0.3–1.2)
Total Protein: 7.5 g/dL (ref 6.0–8.3)

## 2014-08-31 LAB — CBC
HCT: 33.2 % — ABNORMAL LOW (ref 39.0–52.0)
HEMOGLOBIN: 11.2 g/dL — AB (ref 13.0–17.0)
MCH: 35 pg — AB (ref 26.0–34.0)
MCHC: 33.7 g/dL (ref 30.0–36.0)
MCV: 103.8 fL — ABNORMAL HIGH (ref 78.0–100.0)
Platelets: 74 10*3/uL — ABNORMAL LOW (ref 150–400)
RBC: 3.2 MIL/uL — ABNORMAL LOW (ref 4.22–5.81)
RDW: 12.3 % (ref 11.5–15.5)
WBC: 8.1 10*3/uL (ref 4.0–10.5)

## 2014-08-31 LAB — MRSA PCR SCREENING: MRSA by PCR: NEGATIVE

## 2014-08-31 LAB — PHOSPHORUS: Phosphorus: 3.5 mg/dL (ref 2.3–4.6)

## 2014-08-31 LAB — ABO/RH: ABO/RH(D): A NEG

## 2014-08-31 LAB — OCCULT BLOOD GASTRIC / DUODENUM (SPECIMEN CUP)
Occult Blood, Gastric: POSITIVE — AB
pH, Gastric: 1

## 2014-08-31 LAB — AMMONIA: Ammonia: 43 umol/L (ref 11–60)

## 2014-08-31 LAB — I-STAT CG4 LACTIC ACID, ED: LACTIC ACID, VENOUS: 3.99 mmol/L — AB (ref 0.5–2.2)

## 2014-08-31 MED ORDER — THIAMINE HCL 100 MG/ML IJ SOLN
100.0000 mg | Freq: Every day | INTRAMUSCULAR | Status: DC
  Administered 2014-08-31 – 2014-09-01 (×2): 100 mg via INTRAVENOUS
  Filled 2014-08-31 (×3): qty 2

## 2014-08-31 MED ORDER — SODIUM CHLORIDE 0.9 % IV SOLN
INTRAVENOUS | Status: DC
  Administered 2014-08-31: via INTRAVENOUS
  Administered 2014-09-01: 75 mL/h via INTRAVENOUS
  Administered 2014-09-02 (×2): via INTRAVENOUS
  Administered 2014-09-03: 75 mL/h via INTRAVENOUS

## 2014-08-31 MED ORDER — SODIUM CHLORIDE 0.9 % IV SOLN
8.0000 mg/h | INTRAVENOUS | Status: DC
  Administered 2014-08-31 – 2014-09-01 (×3): 8 mg/h via INTRAVENOUS
  Filled 2014-08-31 (×6): qty 80

## 2014-08-31 MED ORDER — PNEUMOCOCCAL VAC POLYVALENT 25 MCG/0.5ML IJ INJ
0.5000 mL | INJECTION | INTRAMUSCULAR | Status: AC
  Administered 2014-09-01: 0.5 mL via INTRAMUSCULAR
  Filled 2014-08-31 (×2): qty 0.5

## 2014-08-31 MED ORDER — PANTOPRAZOLE SODIUM 40 MG IV SOLR: 40.0000 mg | Freq: Two times a day (BID) | INTRAVENOUS | Status: DC

## 2014-08-31 MED ORDER — SODIUM CHLORIDE 0.9 % IV SOLN
80.0000 mg | Freq: Once | INTRAVENOUS | Status: AC
  Administered 2014-08-31: 80 mg via INTRAVENOUS
  Filled 2014-08-31: qty 80

## 2014-08-31 MED ORDER — INFLUENZA VAC SPLIT QUAD 0.5 ML IM SUSY
0.5000 mL | PREFILLED_SYRINGE | INTRAMUSCULAR | Status: DC
  Filled 2014-08-31 (×2): qty 0.5

## 2014-08-31 MED ORDER — LORAZEPAM 2 MG/ML IJ SOLN
2.0000 mg | INTRAMUSCULAR | Status: DC | PRN
  Administered 2014-08-31: 3 mg via INTRAVENOUS
  Administered 2014-09-01 – 2014-09-02 (×6): 2 mg via INTRAVENOUS
  Filled 2014-08-31 (×2): qty 1
  Filled 2014-08-31: qty 2
  Filled 2014-08-31 (×5): qty 1

## 2014-08-31 MED ORDER — SODIUM CHLORIDE 0.9 % IV BOLUS (SEPSIS)
1000.0000 mL | Freq: Once | INTRAVENOUS | Status: AC
  Administered 2014-08-31: 1000 mL via INTRAVENOUS

## 2014-08-31 MED ORDER — CETYLPYRIDINIUM CHLORIDE 0.05 % MT LIQD
7.0000 mL | Freq: Two times a day (BID) | OROMUCOSAL | Status: DC
  Administered 2014-09-01 – 2014-09-03 (×5): 7 mL via OROMUCOSAL

## 2014-08-31 MED ORDER — DEXTROSE 5 % IV SOLN
1.0000 g | INTRAVENOUS | Status: DC
  Administered 2014-08-31 – 2014-09-02 (×3): 1 g via INTRAVENOUS
  Filled 2014-08-31 (×4): qty 10

## 2014-08-31 MED ORDER — FOLIC ACID 5 MG/ML IJ SOLN
1.0000 mg | Freq: Every day | INTRAMUSCULAR | Status: DC
  Administered 2014-08-31 – 2014-09-01 (×2): 1 mg via INTRAVENOUS
  Filled 2014-08-31 (×4): qty 0.2

## 2014-08-31 MED ORDER — OCTREOTIDE LOAD VIA INFUSION
50.0000 ug | Freq: Once | INTRAVENOUS | Status: AC
  Administered 2014-08-31: 50 ug via INTRAVENOUS
  Filled 2014-08-31: qty 25

## 2014-08-31 MED ORDER — SODIUM CHLORIDE 0.9 % IV SOLN
50.0000 ug/h | INTRAVENOUS | Status: DC
  Administered 2014-08-31 – 2014-09-01 (×3): 50 ug/h via INTRAVENOUS
  Filled 2014-08-31 (×6): qty 1

## 2014-08-31 NOTE — ED Notes (Signed)
Patient placed on O2 Quinn due to admin of Ativan

## 2014-08-31 NOTE — ED Notes (Signed)
Patient is a heavy drinker--unable to stop Patient drank 10-12 beers yesterday, stopped drinking at 0200 Patient began to vomit around 1200--thinks about x 5 total Patient called EMS due to blood in vomit

## 2014-08-31 NOTE — ED Notes (Signed)
Patient back from radiology.

## 2014-08-31 NOTE — ED Provider Notes (Signed)
CSN: [id number]     Arrival date & time 08/31/14  1526 History   First MD Initiated Contact with Patient 08/31/14 1533     Chief Complaint  Patient presents with  . Hematemesis  . Abdominal Pain     (Consider location/radiation/quality/duration/timing/severity/associated sxs/prior Treatment) HPI Comments: Patient presents to the ER for evaluation of vomiting blood. Patient reports that he had onset of upper abdominal pain and discomfort with nausea and vomiting earlier today. He has had 4 or 5 episodes of vomiting blood. Patient reports a previous history of liver cirrhosis secondary to alcohol intake. He has never had endoscopy, unknown if he has varices. Patient denies chest pain and shortness of breath. He has not had any fever. Patient reports that the last time he drank was approximately 2 AM. He has started to feel very shaky, does report tremors with withdrawal, no history of seizures.  Patient is a 53 y.o. male presenting with abdominal pain.  Abdominal Pain Associated symptoms: nausea and vomiting     Past Medical History  Diagnosis Date  . Alcohol abuse   . Polysubstance abuse   . [facility]     age 53 or 89   Past Surgical History  Procedure Laterality Date  . Eye surgery     History reviewed. No pertinent family history. History  Substance Use Topics  . Smoking status: Current Every Day Smoker -- 1.00 packs/day for 2 years    Types: Cigarettes  . Smokeless tobacco: Never Used  . Alcohol Use: Yes     Comment: 14 beers per day    Review of Systems  Gastrointestinal: Positive for nausea, vomiting and abdominal pain.       Hematemesis  Neurological: Positive for tremors.  All other systems reviewed and are negative.     Allergies  Review of patient's allergies indicates no known allergies.  Home Medications   Prior to Admission medications   Medication Sig Start Date End Date Taking? Authorizing Provider  Multiple Vitamin  (MULTIVITAMIN WITH MINERALS) TABS tablet Take 0.5 tablets by mouth daily.   Yes Historical Provider, MD  citalopram (CELEXA) 40 MG tablet Take 40 mg by mouth daily.    Historical Provider, MD  furosemide (LASIX) 20 MG tablet TAKE 1/2 TABLET EVERY DAY 05/27/12   Chelle S Jeffery, PA-C  nadolol (CORGARD) 20 MG tablet Take 20 mg by mouth daily.    Historical Provider, MD   BP 114/70 mmHg  Pulse 96  Temp(Src) 98.1 F (36.7 C) (Oral)  Resp 17  SpO2 98% Physical Exam  Constitutional: He is oriented to person, place, and time. He appears well-developed and well-nourished. No distress.  HENT:  Head: Normocephalic and atraumatic.  Right Ear: Hearing normal.  Left Ear: Hearing normal.  Nose: Nose normal.  Mouth/Throat: Oropharynx is clear and moist and mucous membranes are normal.  Eyes: Conjunctivae and EOM are normal. Pupils are equal, round, and reactive to light.  Neck: Normal range of motion. Neck supple.  Cardiovascular: Regular rhythm, S1 normal and S2 normal.  Exam reveals no gallop and no friction rub.   No murmur heard. Pulmonary/Chest: Effort normal and breath sounds normal. No respiratory distress. He exhibits no tenderness.  Abdominal: Soft. Normal appearance and bowel sounds are normal. There is no hepatosplenomegaly. There is tenderness in the epigastric area. There is no rebound, no guarding, no tenderness at McBurney's point and negative Murphy's sign. No hernia.  Musculoskeletal: Normal range of motion.  Neurological: He is alert  and oriented to person, place, and time. He has normal strength. He displays tremor. No cranial nerve deficit or sensory deficit. Coordination normal. GCS eye subscore is 4. GCS verbal subscore is 5. GCS motor subscore is 6.  Skin: Skin is warm, dry and intact. No rash noted. No cyanosis.  Psychiatric: His speech is normal and behavior is normal. Thought content normal. His mood appears anxious. He expresses no suicidal plans.    ED Course  Procedures  (including critical care time) Labs Review Labs Reviewed  CBC - Abnormal; Notable for the following:    RBC 3.20 (*)    Hemoglobin 11.2 (*)    HCT 33.2 (*)    MCV 103.8 (*)    MCH 35.0 (*)    Platelets 74 (*)    All other components within normal limits  BASIC METABOLIC PANEL - Abnormal; Notable for the following:    Glucose, Bld 124 (*)    Creatinine, Ser 0.41 (*)    Anion gap 20 (*)    All other components within normal limits  ETHANOL - Abnormal; Notable for the following:    Alcohol, Ethyl (B) 38 (*)    All other components within normal limits  HEPATIC FUNCTION PANEL - Abnormal; Notable for the following:    Albumin 3.2 (*)    AST 120 (*)    Alkaline Phosphatase 139 (*)    Bilirubin, Direct 0.4 (*)    All other components within normal limits  PROTIME-INR - Abnormal; Notable for the following:    Prothrombin Time 17.1 (*)    All other components within normal limits  OCCULT BLOOD GASTRIC / DUODENUM (SPECIMEN CUP) - Abnormal; Notable for the following:    Occult Blood, Gastric POSITIVE (*)    All other components within normal limits  I-STAT CG4 LACTIC ACID, ED - Abnormal; Notable for the following:    Lactic Acid, Venous 3.99 (*)    All other components within normal limits  POC OCCULT BLOOD, ED - Abnormal; Notable for the following:    Fecal Occult Bld POSITIVE (*)    All other components within normal limits  TROPONIN I  AMMONIA  MAGNESIUM  PHOSPHORUS  TYPE AND SCREEN    Imaging Review No results found.   EKG Interpretation   Date/Time:  Thursday August 31 2014 15:37:22 EST Ventricular Rate:  127 PR Interval:  128 QRS Duration: 83 QT Interval:  356 QTC Calculation: 517 R Axis:   59 Text Interpretation:  Sinus tachycardia Paired ventricular premature  complexes Prolonged QT interval Baseline wander in lead(s) V2 V3 V4 V5 V6  No previous tracing Confirmed by Coraline Talwar  MD, Contina Strain [phone]) on  08/31/2014 3:49:49 PM      MDM   Final diagnoses:   Upper GI bleed  Alcoholic cirrhosis of liver without ascites  Alcohol withdrawal, with unspecified complication    Patient presents to the ER for evaluation of vomiting blood. Patient does have a history of alcoholic cirrhosis. Reports heavy alcohol intake, has never had a GI bleed according to him. He does not see a gastroenterologist and has not ever had endoscopy. There was a concern for variceal bleed based on his presentation. He had mild hypotension and tachycardia on arrival. This seems to be improving with IV fluid bolus. Patient does have mild anemia, but continues to have hematemesis here in the ER. Patient was initiated empirically on Protonix drip, octreotide drip, Rocephin for SBP prophylaxis. I do not suspect any significant ascites, however, based on examination.  Patient discussed with Dr. Loreta AveMann, gastroenterology. She will see the patient in consultation for further management.  She has not had any alcohol intake since 2 AM. Patient exhibiting signs of moderate to severe withdrawal. He does not report a history of seizures with withdrawal. Patient initiated on CIWA protocol, administered Ativan with some improvement of his agitation and tremor.  Patient discussed with Dr. Craige CottaSood, on call for critical care. Critical care will see the patient in evaluation for admission and further management.  CRITICAL CARE Performed by: Gilda CreasePOLLINA, Alan Riles J.   Total critical care time: 30min  Critical care time was exclusive of separately billable procedures and treating other patients.  Critical care was necessary to treat or prevent imminent or life-threatening deterioration.  Critical care was time spent personally by me on the following activities: development of treatment plan with patient and/or surrogate as well as nursing, discussions with consultants, evaluation of patient's response to treatment, examination of patient, obtaining history from patient or surrogate, ordering and  performing treatments and interventions, ordering and review of laboratory studies, ordering and review of radiographic studies, pulse oximetry and re-evaluation of patient's condition.     Gilda Creasehristopher J. Lailany Enoch, MD 09/01/14 [phone]

## 2014-08-31 NOTE — ED Notes (Signed)
Patient taken to radiology 

## 2014-08-31 NOTE — ED Notes (Signed)
Patient resting in position of comfort with eyes closed s/p admin of Ativan 3 mg for CIWA score of 22 RR WNL--even and unlabored with equal rise and fall of chest Patient on O2 [place] Patient in NAD Side rails up, call bell in reach

## 2014-08-31 NOTE — H&P (Addendum)
PULMONARY / CRITICAL CARE MEDICINE   Name: Kenneth [name] MRN: [id number] DOB: November 17, 1960    ADMISSION DATE:  08/31/2014  REFERRING MD :  ER  CHIEF COMPLAINT:  Hematemesis  INITIAL PRESENTATION:  53 yo male smoker with hx of ETOH presented with hematemesis, melena, and ETOH withdrawal.  STUDIES:    SIGNIFICANT EVENTS: 11/05 Admit, GI consulted   HISTORY OF PRESENT ILLNESS:   53 yo male began vomiting blood earlier today.  He drinks 10 to 12 beers per day, and last drink was at 2 am on 11/05.  He was also having abdominal pain and melanotic stool.  He was noted to have initial CIWA score of 22 in ER, improved significantly after benzos.  GI has been called.  He has been started on protonix gtt and octreotide.  He was given ativan with improvement in mental status.  He reports hx of cirrhosis with ascites.  PAST MEDICAL HISTORY :   has a past medical history of Alcohol abuse; Polysubstance abuse; and [facility]  has past surgical history that includes Eye surgery. Prior to Admission medications   Medication Sig Start Date End Date Taking? Authorizing Provider  Multiple Vitamin (MULTIVITAMIN WITH MINERALS) TABS tablet Take 0.5 tablets by mouth daily.   Yes Historical Provider, MD  citalopram (CELEXA) 40 MG tablet Take 40 mg by mouth daily.    Historical Provider, MD  furosemide (LASIX) 20 MG tablet TAKE 1/2 TABLET EVERY DAY 05/27/12   Chelle S Jeffery, PA-C  nadolol (CORGARD) 20 MG tablet Take 20 mg by mouth daily.    Historical Provider, MD   No Known Allergies  FAMILY HISTORY:  has no family status information on file.  SOCIAL HISTORY:  reports that he has been smoking Cigarettes.  He has a 2 pack-year smoking history. He has never used smokeless tobacco. He reports that he drinks alcohol. He reports that he uses illicit drugs (Marijuana).  REVIEW OF SYSTEMS:  Mid epigastric pain, improving. Hematemesis and melena, both inactive since ~ 1800.    SUBJECTIVE:  Wants to know if he can get out of bed, use the bathroom  VITAL SIGNS: Temp:  [98.1 F (36.7 C)] 98.1 F (36.7 C) (11/05 1533) Pulse Rate:  [87-110] 97 (11/05 1730) Resp:  [11-24] 16 (11/05 1730) BP: (102-127)/(66-75) 109/69 mmHg (11/05 1730) SpO2:  [90 %-100 %] 99 % (11/05 1730) INTAKE / OUTPUT: No intake or output data in the 24 hours ending 08/31/14 1801  PHYSICAL EXAMINATION: General:  Thin man, laying comfortably,  Neuro:  Sleepy but wakes to voice and interacts appropriately, no focal weakness HEENT:  OP clear, PERRL,  Cardiovascular:  Regular, no M Lungs:  Clear Bilaterally Abdomen:  Slightly distended but not tense or tender to palp Musculoskeletal:  No edema Skin:  No rash  LABS:  CBC  Recent Labs Lab 08/31/14 1533  WBC 8.1  HGB 11.2*  HCT 33.2*  PLT 74*   Coag's  Recent Labs Lab 08/31/14 1530  INR 1.38   BMET  Recent Labs Lab 08/31/14 1533  NA 141  K 4.0  CL 101  CO2 20  BUN 20  CREATININE 0.41*  GLUCOSE 124*   Electrolytes  Recent Labs Lab 08/31/14 1533 08/31/14 1605  CALCIUM 8.6  --   MG  --  2.0  PHOS  --  3.5   Sepsis Markers  Recent Labs Lab 08/31/14 1558  LATICACIDVEN 3.99*   ABG No results for input(s): PHART, PCO2ART, PO2ART in  the last 168 hours.   Liver Enzymes  Recent Labs Lab 08/31/14 1530  AST 120*  ALT 51  ALKPHOS 139*  BILITOT 1.1  ALBUMIN 3.2*   Cardiac Enzymes  Recent Labs Lab 08/31/14 1530  TROPONINI <0.30   Glucose No results for input(s): GLUCAP in the last 168 hours.  Imaging No results found.   ASSESSMENT / PLAN:  PULMONARY A: Hx of tobacco abuse. P:   Monitor oxygenation Will push cessation  CARDIOVASCULAR A:  Hypotension 2nd to GI bleed, improved with IVF P:  IV fluids and transfuse as needed  RENAL A:   No acute issues. P:   Monitor renal fx, urine outpt, electrolytes  GASTROINTESTINAL A:   Upper GI bleed with hx of ETOH with cirrhosis. P:    NPO Octreotide, protonix gtt GI to evaluate for possible EGD  HEMATOLOGIC A:   Acute GI bleeding with anemia, component of dilution as well Thrombocytopenia 2nd to ETOH. P:  F/u CBC Transfuse for Hb < 7 or bleeding SCD for DVT prevention  INFECTIOUS A:   GI prophylaxis At risk for SBP but no evidence to support at this time P:   Day 1 of rocephin  ENDOCRINE A:   No acute issues. P:   Monitor blood sugar on BMEt  NEUROLOGIC A:   ETOH withdrawal. P:   CIWA q4h with prn ativan >> might need precedex Thiamine, folic acid   FAMILY  - Updates: discussed with pt and family at bedside 11/5   - Inter-disciplinary family meet or Palliative Care meeting due by: 11/12    TODAY'S SUMMARY:  53 yo with hematemesis/melena with hx of ETOH cirrhosis.  Has ETOH withdrawal also.  Admit to ICU, monitor mental status, continue octreotide/protonix, GI to evaluate.    Independent CC time 45 minutes  Levy Pupaobert Byrum, MD, PhD 08/31/2014, 10:06 PM [facility] [phone] or if no answer [phone]

## 2014-08-31 NOTE — ED Notes (Signed)
MD at bedside. 

## 2014-08-31 NOTE — Consult Note (Signed)
Unassigned patient Reason for Consult: Vomiting blood/history of alcohol abuse.  Referring Physician: ER MD  Kenneth [name] is an 53 y.o. male.  HPI: 53 year old white male, with a long standing history of alcohol use, started vomiting blood about 2 AM this morning after he had some epigastric pain. He claims he has been drinking 13-14 beers per day for the last several years. Apparently he was hospitalized in 2010 for ascites when ?13 liters of fluid was removed on multiple occasions by paracentesis but I was not able able to find any old records on Epic. He denies having similar symptoms in the past. He has a good appetite and his weight has been stable. He has been very shaky and has been having withdrawal symptoms since admission. His ex-wife is at his bedside.    Past Medical History  Diagnosis Date  . Alcohol abuse   . Polysubstance abuse   . H/O [facility] spotted fever     age 47 or 44   Past Surgical History  Procedure Laterality Date  . Eye surgery     History reviewed. No pertinent family history.  Social History:  reports that he has been smoking Cigarettes.  He has a 3 pack-year smoking history. He has never used smokeless tobacco. He reports that he drinks alcohol-13 to 14 beers per day for several years. He reports that he uses illicit drugs (Marijuana). He does not work and lives with his parents. He is divorced.   Allergies: No Known Allergies  Medications: I have reviewed the patient's current medications.  Results for orders placed or performed during the hospital encounter of 08/31/14 (from the past 48 hour(s))  Hepatic function panel     Status: Abnormal   Collection Time: 08/31/14  3:30 PM  Result Value Ref Range   Total Protein 7.5 6.0 - 8.3 g/dL   Albumin 3.2 (L) 3.5 - 5.2 g/dL   AST 120 (H) 0 - 37 U/L   ALT 51 0 - 53 U/L   Alkaline Phosphatase 139 (H) 39 - 117 U/L   Total Bilirubin 1.1 0.3 - 1.2 mg/dL   Bilirubin, Direct 0.4 (H) 0.0 - 0.3 mg/dL    Indirect Bilirubin 0.7 0.3 - 0.9 mg/dL  Troponin I     Status: None   Collection Time: 08/31/14  3:30 PM  Result Value Ref Range   Troponin I <0.30 <0.30 ng/mL    Comment:        Due to the release kinetics of cTnI, a negative result within the first hours of the onset of symptoms does not rule out myocardial infarction with certainty. If myocardial infarction is still suspected, repeat the test at appropriate intervals.   Protime-INR     Status: Abnormal   Collection Time: 08/31/14  3:30 PM  Result Value Ref Range   Prothrombin Time 17.1 (H) 11.6 - 15.2 seconds   INR 1.38 0.00 - 1.49  CBC     Status: Abnormal   Collection Time: 08/31/14  3:33 PM  Result Value Ref Range   WBC 8.1 4.0 - 10.5 K/uL   RBC 3.20 (L) 4.22 - 5.81 MIL/uL   Hemoglobin 11.2 (L) 13.0 - 17.0 g/dL   HCT 33.2 (L) 39.0 - 52.0 %   MCV 103.8 (H) 78.0 - 100.0 fL   MCH 35.0 (H) 26.0 - 34.0 pg   MCHC 33.7 30.0 - 36.0 g/dL   RDW 12.3 11.5 - 15.5 %   Platelets 74 (L) 150 -  400 K/uL    Comment: REPEATED TO VERIFY SPECIMEN CHECKED FOR CLOTS PLATELET COUNT CONFIRMED BY SMEAR   Basic metabolic panel     Status: Abnormal   Collection Time: 08/31/14  3:33 PM  Result Value Ref Range   Sodium 141 137 - 147 mEq/L   Potassium 4.0 3.7 - 5.3 mEq/L   Chloride 101 96 - 112 mEq/L   CO2 20 19 - 32 mEq/L   Glucose, Bld 124 (H) 70 - 99 mg/dL   BUN 20 6 - 23 mg/dL   Creatinine, Ser 0.41 (L) 0.50 - 1.35 mg/dL   Calcium 8.6 8.4 - 10.5 mg/dL   GFR calc non Af Amer >90 >90 mL/[name]   GFR calc Af Amer >90 >90 mL/[name]    Comment: (NOTE) The eGFR has been calculated using the CKD EPI equation. This calculation has not been validated in all clinical situations. eGFR's persistently <90 mL/[name] signify possible Chronic Kidney Disease.    Anion gap 20 (H) 5 - 15  Ethanol     Status: Abnormal   Collection Time: 08/31/14  3:33 PM  Result Value Ref Range   Alcohol, Ethyl (B) 38 (H) 0 - 11 mg/dL    Comment:        LOWEST DETECTABLE  LIMIT FOR SERUM ALCOHOL IS 11 mg/dL FOR MEDICAL PURPOSES ONLY   Type and screen     Status: None   Collection Time: 08/31/14  3:42 PM  Result Value Ref Range   ABO/RH(D) A NEG    Antibody Screen NEG    Sample Expiration 09/03/2014   Ammonia     Status: None   Collection Time: 08/31/14  3:46 PM  Result Value Ref Range   Ammonia 43 11 - 60 umol/L  Occult bld gastric/duodenum (cup to lab)     Status: Abnormal   Collection Time: 08/31/14  3:55 PM  Result Value Ref Range   pH, Gastric 1    Occult Blood, Gastric POSITIVE (A) NEGATIVE  I-Stat CG4 Lactic Acid, ED     Status: Abnormal   Collection Time: 08/31/14  3:58 PM  Result Value Ref Range   Lactic Acid, Venous 3.99 (H) 0.5 - 2.2 mmol/L  POC occult blood, ED Provider will collect     Status: Abnormal   Collection Time: 08/31/14  4:05 PM  Result Value Ref Range   Fecal Occult Bld POSITIVE (A) NEGATIVE  Magnesium     Status: None   Collection Time: 08/31/14  4:05 PM  Result Value Ref Range   Magnesium 2.0 1.5 - 2.5 mg/dL  Phosphorus     Status: None   Collection Time: 08/31/14  4:05 PM  Result Value Ref Range   Phosphorus 3.5 2.3 - 4.6 mg/dL   Dg Abd Acute W/chest  08/31/2014   CLINICAL DATA:  Abdominal pain and nausea.  Vomiting blood.  EXAM: ACUTE ABDOMEN SERIES (ABDOMEN 2 VIEW & CHEST 1 VIEW)  COMPARISON:  Chest x-ray 04/08/2009  FINDINGS: There is no evidence for bowel obstruction or perforation. Small bowel loops in the central abdomen have apparent fold thickening on both supine and upright imaging. There is a moderate volume of formed stool, predominantly in the distal colon. Gallstones are noted.  Normal heart size and mediastinal contours. No acute infiltrate or edema. No effusion or pneumothorax. No acute osseous findings.  IMPRESSION: 1. Thickened small bowel folds suggesting enteritis. 2. No evidence of bowel obstruction. 3. Cholelithiasis.   Electronically Signed   By: Gilford Silvius.D.  On: 08/31/2014 18:00   Review  of Systems  Constitutional: Positive for malaise/fatigue. Negative for fever, chills, weight loss and diaphoresis.  HENT: Negative.   Eyes: Negative.   Respiratory: Negative.   Cardiovascular: Negative.   Gastrointestinal: Positive for blood in stool. Negative for nausea, vomiting, abdominal pain, diarrhea, constipation and melena.  Genitourinary: Negative.   Musculoskeletal: Negative.   Skin: Negative.   Neurological: Positive for weakness.  Endo/Heme/Allergies: Negative.   Psychiatric/Behavioral: Positive for substance abuse. Negative for depression, suicidal ideas, hallucinations and memory loss. The patient is not nervous/anxious and does not have insomnia.    Blood pressure 107/71, pulse 111, temperature 98.1 F (36.7 C), temperature source Oral, resp. rate 27, SpO2 100 %. Physical Exam  Constitutional: He is oriented to person, place, and time. He appears well-developed and well-nourished.  HENT:  Head: Normocephalic and atraumatic.  Eyes: Conjunctivae and EOM are normal. Pupils are equal, round, and reactive to light.  Neck: Normal range of motion. Neck supple.  Cardiovascular: Regular rhythm and normal heart sounds.  Tachycardia present.   Respiratory: Effort normal and breath sounds normal. No accessory muscle usage. No respiratory distress.  GI: Soft. Bowel sounds are normal. There is no hepatosplenomegaly. There is no tenderness. There is no rebound and no CVA tenderness.  Neurological: He is alert and oriented to person, place, and time.  Psychiatric: He has a normal mood and affect. His speech is normal. Judgment and thought content normal. He is agitated. Cognition and memory are normal.   Assessment/Plan: 1) Hematemesis with anemia: ?esophageal varices-on PPI's and Octerotide.   2) Alcoholic cirrhosis with history of ascites, abnormal LFT's and thrombocytopenia.  3) Alcohol withdrawal-on Ativan.  4) Cholelithiasis on X-rays.   Kenneth [name] 08/31/2014, 7:48 PM

## 2014-08-31 NOTE — ED Notes (Signed)
Patient asking for pain medication Patient offered pain medication by

## 2014-08-31 NOTE — ED Notes (Signed)
Clifton Custardaron (patient's son) [phone]

## 2014-08-31 NOTE — ED Notes (Signed)
CIWA score 22 Ativan given, see MAR

## 2014-08-31 NOTE — ED Notes (Signed)
Floor nurse to call back for report. 

## 2014-08-31 NOTE — Progress Notes (Signed)
  CARE MANAGEMENT ED NOTE 08/31/2014  Patient:  Meriam SpragueYOUNG,Conlee A   Account Number:  [id number]  Date Initiated:  08/31/2014  Documentation initiated by:  Radford PaxFERRERO,Alayasia Breeding  Subjective/Objective Assessment:   Patient presents to Ed with vomitting due to heavy alcohol consumption.  Abdominal pain     Subjective/Objective Assessment Detail:   Patient with pmhx of severe ETOH abuse.  Patient noted blood in vomit.  History of [place] spotted fever as a child.  Cirrhosis.     Action/Plan:   Action/Plan Detail:   Anticipated DC Date:       Status Recommendation to Physician:   Result of Recommendation:    Other ED Services  Consult Working Plan    DC [employer]  Other    Choice offered to / List presented to:            Status of service:  Completed, signed off  ED Comments:   ED Comments Detail:  [facility]EDCM consulted to speak to patient regarding medicaion assistance.  EDCM spoke to patient at bedside.  Patient is drowsy but arousable, taking patient to xray after [facility] entered [facility] room.  [facility] and services offered [facility].    [facility] with [facility], informed patient of services there and walk in times.  EDCM also provided patient with list of pcps who accept self pay patients, list of discount pharmacies and websites needymeds.org and GoodRX.com for medication assistance, phone number to inquire about the orange card, phone number to inquire about Mediciad, phone number to inquire about the Affordable Care Act, financial resources in the community such as local churches, [employer], urban ministries, and dental assistance for uninsured patients.  Patient thankfulf or resources.  No further EDCM needs at this time.

## 2014-09-01 ENCOUNTER — Encounter (HOSPITAL_COMMUNITY)
Admission: EM | Disposition: A | Discharge status: Home / Self Care | Payer: Self-pay | Source: Home / Self Care | Attending: Emergency Medicine

## 2014-09-01 DIAGNOSIS — F10230 Alcohol dependence with withdrawal, uncomplicated: Secondary | ICD-10-CM

## 2014-09-01 HISTORY — PX: ESOPHAGOGASTRODUODENOSCOPY: SHX5428

## 2014-09-01 LAB — COMPREHENSIVE METABOLIC PANEL WITH GFR
ALT: 42 U/L (ref 0–53)
AST: 96 U/L — ABNORMAL HIGH (ref 0–37)
Albumin: 2.7 g/dL — ABNORMAL LOW (ref 3.5–5.2)
Alkaline Phosphatase: 101 U/L (ref 39–117)
Anion gap: 11 (ref 5–15)
BUN: 18 mg/dL (ref 6–23)
CO2: 26 meq/L (ref 19–32)
Calcium: 7.9 mg/dL — ABNORMAL LOW (ref 8.4–10.5)
Chloride: 106 meq/L (ref 96–112)
Creatinine, Ser: 0.48 mg/dL — ABNORMAL LOW (ref 0.50–1.35)
GFR calc Af Amer: >90 mL/min
GFR calc non Af Amer: >90 mL/min
Glucose, Bld: 110 mg/dL — ABNORMAL HIGH (ref 70–99)
Potassium: 4 meq/L (ref 3.7–5.3)
Sodium: 143 meq/L (ref 137–147)
Total Bilirubin: 1.1 mg/dL (ref 0.3–1.2)
Total Protein: 6.1 g/dL (ref 6.0–8.3)

## 2014-09-01 LAB — CBC
HCT: 23.9 % — ABNORMAL LOW (ref 39.0–52.0)
Hemoglobin: 8.2 g/dL — ABNORMAL LOW (ref 13.0–17.0)
MCH: 35.2 pg — AB (ref 26.0–34.0)
MCHC: 34.3 g/dL (ref 30.0–36.0)
MCV: 102.6 fL — ABNORMAL HIGH (ref 78.0–100.0)
PLATELETS: 32 10*3/uL — AB (ref 150–400)
RBC: 2.33 MIL/uL — AB (ref 4.22–5.81)
RDW: 12.4 % (ref 11.5–15.5)
WBC: 2.6 10*3/uL — ABNORMAL LOW (ref 4.0–10.5)

## 2014-09-01 LAB — PHOSPHORUS: Phosphorus: 3.2 mg/dL (ref 2.3–4.6)

## 2014-09-01 LAB — MAGNESIUM: Magnesium: 2 mg/dL (ref 1.5–2.5)

## 2014-09-01 SURGERY — EGD (ESOPHAGOGASTRODUODENOSCOPY)
Anesthesia: Moderate Sedation | Laterality: Left

## 2014-09-01 SURGERY — EGD (ESOPHAGOGASTRODUODENOSCOPY)
Anesthesia: Moderate Sedation

## 2014-09-01 MED ORDER — MIDAZOLAM HCL 10 MG/2ML IJ SOLN
INTRAMUSCULAR | Status: AC
  Filled 2014-09-01: qty 4

## 2014-09-01 MED ORDER — DIPHENHYDRAMINE HCL 50 MG/ML IJ SOLN
INTRAMUSCULAR | Status: AC
  Filled 2014-09-01: qty 1

## 2014-09-01 MED ORDER — PANTOPRAZOLE SODIUM 40 MG PO TBEC
40.0000 mg | DELAYED_RELEASE_TABLET | Freq: Two times a day (BID) | ORAL | Status: DC
  Administered 2014-09-01 – 2014-09-03 (×4): 40 mg via ORAL
  Filled 2014-09-01 (×5): qty 1

## 2014-09-01 MED ORDER — NICOTINE 21 MG/24HR TD PT24
21.0000 mg | MEDICATED_PATCH | Freq: Every day | TRANSDERMAL | Status: DC
  Administered 2014-09-01 – 2014-09-03 (×3): 21 mg via TRANSDERMAL
  Filled 2014-09-01 (×3): qty 1

## 2014-09-01 MED ORDER — BUTAMBEN-TETRACAINE-BENZOCAINE 2-2-14 % EX AERO
INHALATION_SPRAY | CUTANEOUS | Status: DC | PRN
  Administered 2014-09-01: 2 via TOPICAL

## 2014-09-01 MED ORDER — MIDAZOLAM HCL 10 MG/2ML IJ SOLN
INTRAMUSCULAR | Status: DC | PRN
Start: 2014-09-01 — End: 2014-09-01
  Administered 2014-09-01 (×2): 2.5 mg via INTRAVENOUS

## 2014-09-01 MED ORDER — FENTANYL CITRATE 0.05 MG/ML IJ SOLN
INTRAMUSCULAR | Status: AC
  Filled 2014-09-01: qty 4

## 2014-09-01 MED ORDER — FENTANYL CITRATE 0.05 MG/ML IJ SOLN
INTRAMUSCULAR | Status: DC | PRN
  Administered 2014-09-01 (×2): 25 ug via INTRAVENOUS

## 2014-09-01 NOTE — Care Management Note (Signed)
    Page 1 of 2   09/01/2014     1:35:13 PM CARE MANAGEMENT NOTE 09/01/2014  Patient:  Meriam SpragueYOUNG,Estephan A   Account Number:  [id number]  Date Initiated:  09/01/2014  Documentation initiated by:  DAVIS,RHONDA  Subjective/Objective Assessment:   etoh gi bleed and withdrawal     Action/Plan:   tbd depending on psych clearance and med clearance   Anticipated DC Date:  09/04/2014   Anticipated DC Plan:    In-house referral  Clinical Social Worker      DC Planning Services  CM consult      [facility]  [facility]   Choice offered to / List presented to:  NA   DME arranged  NA      DME agency  NA     HH arranged  NA  NA      HH agency  NA   Status of service:  In process, will continue to follow Medicare Important Message given?   (If response is "NO", the following Medicare IM given date fields will be blank) Date Medicare IM given:   Medicare IM given by:   Date Additional Medicare IM given:   Additional Medicare IM given by:    Discharge Disposition:    Per UR Regulation:  Reviewed for med. necessity/level of care/duration of stay  If discussed at Long Length of Stay Meetings, dates discussed:    Comments:  [id number]/Rhonda Earlene Plateravis, RN, BSN, CCM Chart reviewed. Discharge needs and patient's stay to be reviewed and followed by case manager.

## 2014-09-01 NOTE — Op Note (Signed)
[facility] [address] [place], [id number]   OPERATIVE PROCEDURE REPORT  PATIENT :Kenneth [name], Kenneth [name]  MR#: [id number] BIRTHDATE :[birth year] GENDER: male ENDOSCOPIST: Lorenza BurtonJyothi N Adonica Fukushima, MD ASSISTANT:   Claudie ReveringBecky Vernon, RN and Oletha Blendavida Shoffner, technician. PROCEDURE DATE: 09/01/2014 PRE-PROCEDURE PREPERATION: Patient fasted for 4 hours prior to procedure. PRE-PROCEDURE PHYSICAL: Patient has stable vital signs.  Neck is supple.  There is no JVD, thyromegaly or LAD.  Chest clear to auscultation.  S1 and S2 regular.  Abdomen soft, non-distended, non-tender with NABS. PROCEDURE:     EGD with band ligation of esophageal varices ASA CLASS:     Class III INDICATIONS:     1) Hematemesis 2) Acute post hemorrhagic anemia 3) History of alcoholic cirrhosis. MEDICATIONS:     Fentanyl 50 mcg and Versed 4 mg IV TOPICAL ANESTHETIC:   Cetacaine Spray x 2.  DESCRIPTION OF PROCEDURE: After the risks benefits and alternatives of the procedure were thoroughly explained, informed consent was obtained. The Pentax Gastroscope [id number]  was introduced through the mouth and advanced to the second portion of the duodenum , without limitations. The instrument was slowly withdrawn as the mucosa was fully examined.      ESOPHAGUS: There were 2 columns of Grade II-III varices noted in the distal esophagus and 5 esophageal bands were placed.  STOMACH: Portal hypertensive gastropathy was noted throughout the gastric mucosa. [name] small hiatal hernia was noted on retroflexion.  DUODENUM: The duodenal mucosa showed no abnormalities.  [name]. The scope was then withdrawn from the patient and the procedure terminated. The patient tolerated the procedure without immediate complications.  IMPRESSION:  Esophageal varices-esophageal bands placed x 5. 2) Portal hypertensive gastropathy was found 3) Small hiatl hernia. 4) The duodenal mucosa showed no abnormalities.        RECOMMENDATIONS:     1.   Anti-reflux regimen to be followed 2.  Avoid all NSAIDS for now. 3.  Continue current medications 4.  Continue PPI 's. 5. Discontinue Octreotide. 6. Clear liquid diet for now.  REPEAT EXAM:  In 3 months.  DISCHARGE INSTRUCTIONS: standard discharge _______________________________ eSignedLorenza Burton:  Emri Sample N Lovell Nuttall, MD 09/01/2014 5:42 PM   CPT CODES:     43244@Upper  gastrointestinal endoscopy including esophagus, stomach, and either the duodenum and/or jejunum as appropriate; with band ligation of esophageal and/or gastric varices DIAGNOSIS CODES:     K92.0 Hematemesis D62 Acute posthemorrhagic anemia I86.00 Esophageal varices with bleeding  The ICD and CPT codes recommended by this software are interpretations from the data that the clinical staff has captured with the software.  The verification of the translation of this report to the ICD and CPT codes and modifiers is the sole responsibility of the health care institution and practicing physician where this report was generated.  [employer] will not be held responsible for the validity of the ICD and CPT codes included on this report.  AMA assumes no liability for data contained or not contained herein. CPT is [name] rights managerregistered trademark of the [employer].   CC: THP  PATIENT NAME:  Kenneth [name], Kenneth [name] MR#: [id number]

## 2014-09-01 NOTE — Progress Notes (Signed)
PULMONARY / CRITICAL CARE MEDICINE   Name: Kenneth [name] MRN: [id number] DOB: Apr 18, 1961    ADMISSION DATE:  08/31/2014  REFERRING MD :  ER  CHIEF COMPLAINT:  Hematemesis  INITIAL PRESENTATION:  53 yo male smoker with hx of ETOH presented with hematemesis, melena, and ETOH withdrawal.  STUDIES:    SIGNIFICANT EVENTS: 11/05 Admit, GI consulted  SUBJECTIVE:  No events overnight, EGD planned for today.  VITAL SIGNS: Temp:  [98.1 F (36.7 C)-98.8 F (37.1 C)] 98.8 F (37.1 C) (11/06 0800) Pulse Rate:  [80-111] 81 (11/06 0800) Resp:  [11-27] 18 (11/06 0800) BP: (85-127)/(49-75) 88/62 mmHg (11/06 0800) SpO2:  [90 %-100 %] 100 % (11/06 0801) Weight:  [67.8 kg (149 lb 7.6 oz)-69.4 kg (153 lb)] 69.4 kg (153 lb) (11/06 0441) INTAKE / OUTPUT:  Intake/Output Summary (Last 24 hours) at 09/01/14 1005 Last data filed at 09/01/14 0600  Gross per 24 hour  Intake 1221.67 ml  Output    750 ml  Net 471.67 ml    PHYSICAL EXAMINATION: General:  Thin man, laying comfortably,  Neuro:  Sleepy but wakes to voice and interacts appropriately, no focal weakness HEENT:  OP clear, PERRL,  Cardiovascular:  Regular, no M Lungs:  Clear Bilaterally Abdomen:  Slightly distended but not tense or tender to palp Musculoskeletal:  No edema Skin:  No rash  LABS:  CBC  Recent Labs Lab 08/31/14 1533 08/31/14 2033 09/01/14 0404  WBC 8.1 3.8* 2.6*  HGB 11.2* 8.9* 8.2*  HCT 33.2* 26.2* 23.9*  PLT 74* 31* 32*   Coag's  Recent Labs Lab 08/31/14 1530 08/31/14 2033  INR 1.38 1.48   BMET  Recent Labs Lab 08/31/14 1533 08/31/14 2033 09/01/14 0404  NA 141 142 143  K 4.0 4.5 4.0  CL 101 107 106  CO2 20 23 26   BUN 20 18 18   CREATININE 0.41* 0.41* 0.48*  GLUCOSE 124* 123* 110*   Electrolytes  Recent Labs Lab 08/31/14 1533 08/31/14 1605 08/31/14 2033 09/01/14 0404  CALCIUM 8.6  --  7.9* 7.9*  MG  --  2.0  --  2.0  PHOS  --  3.5  --  3.2   Sepsis Markers  Recent  Labs Lab 08/31/14 1558  LATICACIDVEN 3.99*   ABG No results for input(s): PHART, PCO2ART, PO2ART in the last 168 hours.   Liver Enzymes  Recent Labs Lab 08/31/14 1530 08/31/14 2033 09/01/14 0404  AST 120* 103* 96*  ALT 51 43 42  ALKPHOS 139* 109 101  BILITOT 1.1 1.2 1.1  ALBUMIN 3.2* 2.8* 2.7*   Cardiac Enzymes  Recent Labs Lab 08/31/14 1530  TROPONINI <0.30   Glucose No results for input(s): GLUCAP in the last 168 hours.  Imaging Dg Abd Acute W/chest  08/31/2014   CLINICAL DATA:  Abdominal pain and nausea.  Vomiting blood.  EXAM: ACUTE ABDOMEN SERIES (ABDOMEN 2 VIEW & CHEST 1 VIEW)  COMPARISON:  Chest x-ray 04/08/2009  FINDINGS: There is no evidence for bowel obstruction or perforation. Small bowel loops in the central abdomen have apparent fold thickening on both supine and upright imaging. There is a moderate volume of formed stool, predominantly in the distal colon. Gallstones are noted.  Normal heart size and mediastinal contours. No acute infiltrate or edema. No effusion or pneumothorax. No acute osseous findings.  IMPRESSION: 1. Thickened small bowel folds suggesting enteritis. 2. No evidence of bowel obstruction. 3. Cholelithiasis.   Electronically Signed   By: Tiburcio PeaJonathan  Watts M.D.   On:  08/31/2014 18:00     ASSESSMENT / PLAN:  PULMONARY A: Hx of tobacco abuse. P:   Monitor oxygenation Will push cessation  CARDIOVASCULAR A:  Hypotension 2nd to GI bleed, improved with IVF P:  IV fluids and transfuse as needed  RENAL A:   No acute issues. P:   Monitor renal fx, urine outpt, electrolytes  GASTROINTESTINAL A:   Upper GI bleed with hx of ETOH with cirrhosis. P:   NPO until cleared by GI Octreotide, protonix gtt EGD today  HEMATOLOGIC A:   Acute GI bleeding with anemia, component of dilution as well Thrombocytopenia 2nd to ETOH. P:  F/u CBC Transfuse for Hb < 7 or bleeding SCD for DVT prevention  INFECTIOUS A:   GI prophylaxis At risk  for SBP but no evidence to support at this time P:   Day 2 of rocephin  ENDOCRINE A:   No acute issues. P:   Monitor blood sugar on BMET  NEUROLOGIC A:   ETOH withdrawal. P:   CIWA q4h with prn ativan >> might need precedex Thiamine, folic acid  FAMILY  - Updates: discussed with pt and family at bedside 11/6  - Inter-disciplinary family meet or Palliative Care meeting due by: 11/12  TODAY'S SUMMARY:  53 yo with hematemesis/melena with hx of ETOH cirrhosis.  CIWA score low, little ativan needed, [facility]M will sign off, please call back if needed.  Alyson ReedyWesam G. Shaheen Star, M.D. [facility]. Pager: [phone]. After hours pager: [phone].

## 2014-09-02 ENCOUNTER — Encounter (HOSPITAL_COMMUNITY): Payer: Self-pay | Admitting: Internal Medicine

## 2014-09-02 DIAGNOSIS — D62 Acute posthemorrhagic anemia: Secondary | ICD-10-CM | POA: Diagnosis present

## 2014-09-02 DIAGNOSIS — D72819 Decreased white blood cell count, unspecified: Secondary | ICD-10-CM

## 2014-09-02 DIAGNOSIS — D696 Thrombocytopenia, unspecified: Secondary | ICD-10-CM

## 2014-09-02 DIAGNOSIS — K449 Diaphragmatic hernia without obstruction or gangrene: Secondary | ICD-10-CM

## 2014-09-02 DIAGNOSIS — K3189 Other diseases of stomach and duodenum: Secondary | ICD-10-CM

## 2014-09-02 DIAGNOSIS — I8500 Esophageal varices without bleeding: Secondary | ICD-10-CM | POA: Diagnosis present

## 2014-09-02 DIAGNOSIS — K766 Portal hypertension: Secondary | ICD-10-CM

## 2014-09-02 DIAGNOSIS — K7031 Alcoholic cirrhosis of liver with ascites: Secondary | ICD-10-CM | POA: Insufficient documentation

## 2014-09-02 DIAGNOSIS — K7030 Alcoholic cirrhosis of liver without ascites: Secondary | ICD-10-CM

## 2014-09-02 HISTORY — DX: Diaphragmatic hernia without obstruction or gangrene: K44.9

## 2014-09-02 HISTORY — DX: Thrombocytopenia, unspecified: D69.6

## 2014-09-02 HISTORY — DX: Portal hypertension: K76.6

## 2014-09-02 HISTORY — DX: Other diseases of stomach and duodenum: K31.89

## 2014-09-02 HISTORY — DX: Esophageal varices without bleeding: I85.00

## 2014-09-02 LAB — BASIC METABOLIC PANEL
Anion gap: 11 (ref 5–15)
BUN: 8 mg/dL (ref 6–23)
CO2: 24 meq/L (ref 19–32)
CREATININE: 0.47 mg/dL — AB (ref 0.50–1.35)
Calcium: 8.1 mg/dL — ABNORMAL LOW (ref 8.4–10.5)
Chloride: 107 mEq/L (ref 96–112)
GFR calc Af Amer: >90 mL/min (ref >90)
GFR calc non Af Amer: >90 mL/min (ref >90)
GLUCOSE: 141 mg/dL — AB (ref 70–99)
Potassium: 3.2 mEq/L — ABNORMAL LOW (ref 3.7–5.3)
Sodium: 142 mEq/L (ref 137–147)

## 2014-09-02 LAB — CBC
HEMATOCRIT: 24 % — AB (ref 39.0–52.0)
Hemoglobin: 8.2 g/dL — ABNORMAL LOW (ref 13.0–17.0)
MCH: 34.7 pg — ABNORMAL HIGH (ref 26.0–34.0)
MCHC: 34.2 g/dL (ref 30.0–36.0)
MCV: 101.7 fL — AB (ref 78.0–100.0)
Platelets: 38 10*3/uL — ABNORMAL LOW (ref 150–400)
RBC: 2.36 MIL/uL — ABNORMAL LOW (ref 4.22–5.81)
RDW: 12.3 % (ref 11.5–15.5)
WBC: 3.3 10*3/uL — AB (ref 4.0–10.5)

## 2014-09-02 LAB — HEMOGLOBIN AND HEMATOCRIT, BLOOD
HCT: 25.1 % — ABNORMAL LOW (ref 39.0–52.0)
Hemoglobin: 8.5 g/dL — ABNORMAL LOW (ref 13.0–17.0)

## 2014-09-02 MED ORDER — NADOLOL 20 MG PO TABS
20.0000 mg | ORAL_TABLET | Freq: Every day | ORAL | Status: DC
  Administered 2014-09-02 – 2014-09-03 (×2): 20 mg via ORAL
  Filled 2014-09-02 (×2): qty 1

## 2014-09-02 MED ORDER — POTASSIUM CHLORIDE 20 MEQ/15ML (10%) PO SOLN
40.0000 meq | Freq: Once | ORAL | Status: AC
  Administered 2014-09-02: 40 meq via ORAL
  Filled 2014-09-02: qty 30

## 2014-09-02 MED ORDER — FOLIC ACID 1 MG PO TABS
1.0000 mg | ORAL_TABLET | Freq: Every day | ORAL | Status: DC
  Administered 2014-09-02 – 2014-09-03 (×2): 1 mg via ORAL
  Filled 2014-09-02 (×2): qty 1

## 2014-09-02 MED ORDER — VITAMIN B-1 100 MG PO TABS
100.0000 mg | ORAL_TABLET | Freq: Every day | ORAL | Status: DC
  Administered 2014-09-02 – 2014-09-03 (×2): 100 mg via ORAL
  Filled 2014-09-02 (×2): qty 1

## 2014-09-02 MED ORDER — POTASSIUM CHLORIDE CRYS ER 20 MEQ PO TBCR
40.0000 meq | EXTENDED_RELEASE_TABLET | Freq: Once | ORAL | Status: DC
  Filled 2014-09-02: qty 2

## 2014-09-02 MED ORDER — CITALOPRAM HYDROBROMIDE 40 MG PO TABS
40.0000 mg | ORAL_TABLET | Freq: Every day | ORAL | Status: DC
  Administered 2014-09-02 – 2014-09-03 (×2): 40 mg via ORAL
  Filled 2014-09-02: qty 1
  Filled 2014-09-02: qty 2

## 2014-09-02 NOTE — Plan of Care (Signed)
Problem: Phase I Progression Outcomes Goal: Pain controlled with appropriate interventions Outcome: Completed/Met Date Met:  09/02/14

## 2014-09-02 NOTE — Plan of Care (Signed)
Problem: Phase I Progression Outcomes Goal: Hemodynamically stable Outcome: Progressing VSS.  Blackish red stools x 3.

## 2014-09-02 NOTE — Progress Notes (Signed)
Progress Note   Kenneth [name] ZOX:[id number] DOB: 04/29/1961 DOA: 08/31/2014 PCP: No primary care provider on file.   Brief Narrative:   Kenneth SpragueSamuel A Dowe is an 53 y.o. male with a PMH of alcohol abuse who team with hematemesis, and melena.  He underwent an EGD on 09/01/14 which showed esophageal varices, status post esophageal banding 5 and portal hypertensive gastropathy.  Assessment/Plan:   Principal Problem:   Upper GI bleed Secondary to portal hypertension with esophageal varices and portal hypertensive gastropathy / acute blood loss anemia  Status post EGD 09/01/14. Bleeding from varices. Status post banding 5.  Resume Nadolol.  Treated with octreotide which was discontinued 09/01/14.  Continue PPI therapy.  Continue empiric Rocephin.  Avoid NSAIDs.  Currently on a clear liquid diet.  Active Problems:   Leukopenia/thrombocytopenia  Likely secondary to the toxic effects of alcohol on bone marrow.    Tobacco abuse  Continue nicotine patch.    Alcohol abuse  Continue Ativan detox per CIWA protocol.  Continue thiamine and folic acid supplementation.  Resume Celexa, patient says he was abstinent when he was treated with this in the past.    Hiatal hernia  Continue PPI therapy.    DVT Prophylaxis  Continue SCDs.  Code Status: Full. Family Communication: Wife updated at the bedside. Disposition Plan: Home when stable.   IV Access:    Peripheral IV   Procedures and diagnostic studies:   Dg Abd Acute W/chest 08/31/2014: 1. Thickened small bowel folds suggesting enteritis. 2. No evidence of bowel obstruction. 3. Cholelithiasis.   EGD 09/01/14:  Esophageal varices, esophageal bands placed 5. Portal hypertensive gastropathy. Small hiatal hernia. No abnormalities of the duodenal mucosa.  Medical Consultants:    None.  Anti-Infectives:    Rocephin 08/31/14--->  Subjective:    Kenneth SpragueSamuel A Flannagan denies any nausea or vomiting.  No diarrhea.  No  significant pain, except for some stomach discomfort from his prior episodes of nausea and vomiting.    Objective:    Filed Vitals:   09/02/14 0200 09/02/14 0300 09/02/14 0400 09/02/14 0600  BP: 95/63 133/82 98/75 110/84  Pulse: 101  100 116  Temp:   99.1 F (37.3 C)   TempSrc:   Oral   Resp: 17 24 19 23   Height:      Weight:      SpO2: 92%  93% 96%    Intake/Output Summary (Last 24 hours) at 09/02/14 0709 Last data filed at 09/02/14 0600  Gross per 24 hour  Intake   2175 ml  Output   1825 ml  Net    350 ml    Exam: Gen:  NAD Cardiovascular:  RRR, No M/R/G Respiratory:  Lungs CTAB Gastrointestinal:  Abdomen soft, NT/ND, + BS Extremities:  No C/E/C   Data Reviewed:    Labs: Basic Metabolic Panel:  Recent Labs Lab 08/31/14 1533 08/31/14 1605 08/31/14 2033 09/01/14 0404  NA 141  --  142 143  K 4.0  --  4.5 4.0  CL 101  --  107 106  CO2 20  --  23 26  GLUCOSE 124*  --  123* 110*  BUN 20  --  18 18  CREATININE 0.41*  --  0.41* 0.48*  CALCIUM 8.6  --  7.9* 7.9*  MG  --  2.0  --  2.0  PHOS  --  3.5  --  3.2   GFR Estimated Creatinine Clearance: 99.8 mL/min (by C-G formula based on Cr of  0.48). Liver Function Tests:  Recent Labs Lab 08/31/14 1530 08/31/14 2033 09/01/14 0404  AST 120* 103* 96*  ALT 51 43 42  ALKPHOS 139* 109 101  BILITOT 1.1 1.2 1.1  PROT 7.5 6.3 6.1  ALBUMIN 3.2* 2.8* 2.7*    Recent Labs Lab 08/31/14 1546  AMMONIA 43   Coagulation profile  Recent Labs Lab 08/31/14 1530 08/31/14 2033  INR 1.38 1.48    CBC:  Recent Labs Lab 08/31/14 1533 08/31/14 2033 09/01/14 0404  WBC 8.1 3.8* 2.6*  NEUTROABS  --  2.8  --   HGB 11.2* 8.9* 8.2*  HCT 33.2* 26.2* 23.9*  MCV 103.8* 103.1* 102.6*  PLT 74* 31* 32*   Cardiac Enzymes:  Recent Labs Lab 08/31/14 1530  TROPONINI <0.30   Sepsis Labs:  Recent Labs Lab 08/31/14 1533 08/31/14 1558 08/31/14 2033 09/01/14 0404  WBC 8.1  --  3.8* 2.6*  LATICACIDVEN  --  3.99*   --   --    Microbiology Recent Results (from the past 240 hour(s))  MRSA PCR Screening     Status: None   Collection Time: 08/31/14  7:44 PM  Result Value Ref Range Status   MRSA by PCR NEGATIVE NEGATIVE Final    Comment:        The GeneXpert MRSA Assay (FDA approved for NASAL specimens only), is one component of a comprehensive MRSA colonization surveillance program. It is not intended to diagnose MRSA infection nor to guide or monitor treatment for MRSA infections.      Medications:   . antiseptic oral rinse  7 mL Mouth Rinse BID  . cefTRIAXone (ROCEPHIN)  IV  1 g Intravenous Q24H  . folic acid  1 mg Intravenous Daily  . Influenza vac split quadrivalent PF  0.5 mL Intramuscular Tomorrow-1000  . nicotine  21 mg Transdermal Daily  . pantoprazole  40 mg Oral BID  . thiamine  100 mg Intravenous Daily   Continuous Infusions: . sodium chloride 75 mL/hr at 09/02/14 0600    Time spent: 35 minutes with > 50% of time discussing current diagnostic test results, clinical impression and plan of care.    LOS: 2 days   Xcaret Morad  [facility] Pager [phone]. If unable to reach me by pager, please call my cell phone at [phone].  *Please refer to [email or web address], password [email or web address] to get updated schedule on who will round on this patient, as hospitalists switch teams weekly. If 7PM-7AM, please contact night-coverage at [email or web address], password [email or web address] for any overnight needs.  09/02/2014, 7:09 AM

## 2014-09-02 NOTE — Progress Notes (Signed)
[facility] Progress Note  Subjective:  No pain.  No further vomiting.  No BM as of yet.  Didn't eat much because he said that he didn't like what he had on the clear liquid diet.  Begin transferred out of SD/ICU today.  Objective:  Vital signs in last 24 hours: Temp:  [98.3 F (36.8 C)-99.1 F (37.3 C)] 99.1 F (37.3 C) (11/07 0400) Pulse Rate:  [77-116] 99 (11/07 0800) Resp:  [0-26] 19 (11/07 0800) BP: (91-133)/(46-84) 109/77 mmHg (11/07 0800) SpO2:  [89 %-100 %] 97 % (11/07 0800) Last BM Date: 08/31/14 General:  Alert, Well-developed, in NAD Heart:  Regular rate and rhythm; no murmurs Pulm:  CTAB.  N W/R/R. Abdomen:  Soft, non-distended. Normal bowel sounds.  Non-tender.  Extremities:  Without edema. Neurologic:  Alert and  oriented x4;  grossly normal neurologically. Psych:  Alert and cooperative. Normal mood and affect.  Intake/Output from previous day: 11/06 0701 - 11/07 0700 In: 2325 [I.V.:2275; IV Piggyback:50] Out: 1825 [Urine:1825] Intake/Output this shift: Total I/O In: 75 [I.V.:75] Out: -   Lab Results:  Recent Labs  08/31/14 2033 09/01/14 0404 09/02/14 0701  WBC 3.8* 2.6* 3.3*  HGB 8.9* 8.2* 8.2*  HCT 26.2* 23.9* 24.0*  PLT 31* 32* 38*   BMET  Recent Labs  08/31/14 2033 09/01/14 0404 09/02/14 0701  NA 142 143 142  K 4.5 4.0 3.2*  CL 107 106 107  CO2 23 26 24   GLUCOSE 123* 110* 141*  BUN 18 18 8   CREATININE 0.41* 0.48* 0.47*  CALCIUM 7.9* 7.9* 8.1*   LFT  Recent Labs  08/31/14 1530  09/01/14 0404  PROT 7.5  < > 6.1  ALBUMIN 3.2*  < > 2.7*  AST 120*  < > 96*  ALT 51  < > 42  ALKPHOS 139*  < > 101  BILITOT 1.1  < > 1.1  BILIDIR 0.4*  --   --   IBILI 0.7  --   --   < > = values in this interval not displayed. PT/INR  Recent Labs  08/31/14 1530 08/31/14 2033  LABPROT 17.1* 18.1*  INR 1.38 1.48   Dg Abd Acute W/chest  08/31/2014   CLINICAL DATA:  Abdominal pain and nausea.  Vomiting blood.  EXAM: ACUTE  ABDOMEN SERIES (ABDOMEN 2 VIEW & CHEST 1 VIEW)  COMPARISON:  Chest x-ray 04/08/2009  FINDINGS: There is no evidence for bowel obstruction or perforation. Small bowel loops in the central abdomen have apparent fold thickening on both supine and upright imaging. There is a moderate volume of formed stool, predominantly in the distal colon. Gallstones are noted.  Normal heart size and mediastinal contours. No acute infiltrate or edema. No effusion or pneumothorax. No acute osseous findings.  IMPRESSION: 1. Thickened small bowel folds suggesting enteritis. 2. No evidence of bowel obstruction. 3. Cholelithiasis.   Electronically Signed   By: Tiburcio PeaJonathan  Watts M.D.   On: 08/31/2014 18:00   Assessment / Plan: 1) Esophageal varices with bands placed x 5 on 11/6.  Also found to have portal hypertensive gastropathy. 2) Hematemesis with anemia:  Secondary to above.  Hgb stable from yesterday at 8.2 grams. 3) Alcoholic cirrhosis with history of ascites, abnormal LFT's, and thrombocytopenia.  4) Alcohol withdrawal-on Ativan.   *BID PPI and anti-reflux regimen.  Avoid NSAID's. *Nadolol 20 mg daily, may need to be increased eventually. *Needs to follow-up with Dr. Loreta AveMann with repeat EGD in 3 months as well. *Will get 3rd  dose of Rocephin today.  When discharged he should be switched to cipro 500 mg BID to complete total of 7 day course. *Needs ETOH abstinence. *Will advance to soft diet.    LOS: 2 days   Danial Hlavac D.  09/02/2014, 11:00 AM  Pager number [phone]

## 2014-09-03 DIAGNOSIS — K449 Diaphragmatic hernia without obstruction or gangrene: Secondary | ICD-10-CM

## 2014-09-03 LAB — BASIC METABOLIC PANEL
Anion gap: 11 (ref 5–15)
BUN: 7 mg/dL (ref 6–23)
CALCIUM: 8.5 mg/dL (ref 8.4–10.5)
CO2: 25 mEq/L (ref 19–32)
Chloride: 105 mEq/L (ref 96–112)
Creatinine, Ser: 0.58 mg/dL (ref 0.50–1.35)
GFR calc Af Amer: >90 mL/min (ref >90)
Glucose, Bld: 88 mg/dL (ref 70–99)
Potassium: 3.7 mEq/L (ref 3.7–5.3)
SODIUM: 141 meq/L (ref 137–147)

## 2014-09-03 LAB — CBC
HCT: 24.4 % — ABNORMAL LOW (ref 39.0–52.0)
HEMOGLOBIN: 8.5 g/dL — AB (ref 13.0–17.0)
MCH: 35.4 pg — AB (ref 26.0–34.0)
MCHC: 34.8 g/dL (ref 30.0–36.0)
MCV: 101.7 fL — AB (ref 78.0–100.0)
Platelets: 57 10*3/uL — ABNORMAL LOW (ref 150–400)
RBC: 2.4 MIL/uL — ABNORMAL LOW (ref 4.22–5.81)
RDW: 12.1 % (ref 11.5–15.5)
WBC: 5.3 10*3/uL (ref 4.0–10.5)

## 2014-09-03 LAB — MAGNESIUM: MAGNESIUM: 1.9 mg/dL (ref 1.5–2.5)

## 2014-09-03 MED ORDER — PANTOPRAZOLE SODIUM 40 MG PO TBEC: 40.0000 mg | DELAYED_RELEASE_TABLET | Freq: Two times a day (BID) | ORAL | Status: DC

## 2014-09-03 MED ORDER — CITALOPRAM HYDROBROMIDE 40 MG PO TABS: 40.0000 mg | ORAL_TABLET | Freq: Every day | ORAL | Status: DC

## 2014-09-03 MED ORDER — INFLUENZA VAC SPLIT QUAD 0.5 ML IM SUSY
0.5000 mL | PREFILLED_SYRINGE | INTRAMUSCULAR | Status: AC | PRN
  Administered 2014-09-03: 0.5 mL via INTRAMUSCULAR
  Filled 2014-09-03: qty 0.5

## 2014-09-03 MED ORDER — THIAMINE HCL 100 MG PO TABS: 100.0000 mg | ORAL_TABLET | Freq: Every day | ORAL | Status: DC

## 2014-09-03 MED ORDER — FOLIC ACID 1 MG PO TABS: 1.0000 mg | ORAL_TABLET | Freq: Every day | ORAL | Status: DC

## 2014-09-03 MED ORDER — CIPROFLOXACIN HCL 500 MG PO TABS: 500.0000 mg | ORAL_TABLET | Freq: Two times a day (BID) | ORAL | Status: DC

## 2014-09-03 MED ORDER — NADOLOL 20 MG PO TABS: 20.0000 mg | ORAL_TABLET | Freq: Every day | ORAL | Status: DC

## 2014-09-03 NOTE — Discharge Summary (Signed)
Physician Discharge Summary  [name]:[id number] DOB: 03-22-1961 DOA: 08/31/2014  PCP: No primary care provider on file.  Admit date: 08/31/2014 Discharge date: 09/03/2014   Recommendations for Outpatient Follow-Up:   1. The patient will F/U with Dr. Loreta Ave in 2 weeks.  Recommend checking a CBC at that visit to ensure stability of hemoglobin. 2. [facility] to [facility] care.   Discharge Diagnosis:   Principal Problem:    Upper GI bleed Active Problems:    Alcohol abuse    Esophageal varices    Portal hypertension with esophageal varices    Portal hypertensive gastropathy    Hiatal hernia    Acute blood loss anemia    Leukopenia    Thrombocytopenia    Alcoholic cirrhosis of liver without ascites   Discharge Condition: Improved.  Diet recommendation: Low sodium, heart healthy.     History of Present Illness:   Kenneth [name] is an 53 y.o. male with a PMH of alcohol abuse who team with hematemesis, and melena. He underwent an EGD on 09/01/14 which showed esophageal varices, status post esophageal banding 5 and portal hypertensive gastropathy.  Hospital Course by Problem:   Principal Problem:  Upper GI bleed Secondary to portal hypertension with esophageal varices and portal hypertensive gastropathy / acute blood loss anemia  Status post EGD 09/01/14. Bleeding from varices. Status post banding 5. Continue Nadolol.  Treated with octreotide which was discontinued 09/01/14.  Continue PPI therapy. Treated with empiric Rocephin. Discharged home on oral Cipro for total of a 7 day course of antibiotics.  Hemoglobin stable at discharge.  Advised to avoid NSAIDs.  Active Problems:  Leukopenia/thrombocytopenia  Likely secondary to the toxic effects of alcohol on bone marrow.  Counseled on cessation.   Tobacco abuse  Provided with a nicotine patch. Counseled on cessation.    Alcohol abuse  Treated with Ativan detox per [facility] protocol.  Continue thiamine and folic acid supplementation.  Resume Celexa, patient says he was abstinent when he was treated with this in the past.  Social worker consulted for alcohol abstinence counseling.   Hiatal hernia  Continue PPI therapy.    Medical Consultants:    Dr. Charna Elizabeth, GI.  Discharge Exam:   Filed Vitals:   09/03/14 1200  BP: 108/67  Pulse: 76  Temp:   Resp:    Filed Vitals:   09/02/14 2217 09/03/14 0616 09/03/14 0800 09/03/14 1200  BP: 112/78 104/67 104/64 108/67  Pulse: 68 72 81 76  Temp: 98.4 F (36.9 C) 99.9 F (37.7 C)    TempSrc: Oral Oral    Resp: 18 18    Height:      Weight:      SpO2: 96% 96%      Gen:  NAD Cardiovascular:  RRR, No M/R/G Respiratory: Lungs CTAB Gastrointestinal: Abdomen soft, NT/ND with normal active bowel sounds. Extremities: No C/E/C   The results of significant diagnostics from this hospitalization (including imaging, microbiology, ancillary and laboratory) are listed below for reference.     Procedures and Diagnostic Studies:   Dg Abd Acute W/chest 08/31/2014: 1. Thickened small bowel folds suggesting enteritis. 2. No evidence of bowel obstruction. 3. Cholelithiasis.   EGD 09/01/14: Esophageal varices, esophageal bands placed 5. Portal hypertensive gastropathy. Small hiatal hernia. No abnormalities of the duodenal mucosa.   Labs:   Basic Metabolic Panel:  Recent Labs Lab 08/31/14 1533 08/31/14 1605 08/31/14 2033 09/01/14 0404 09/02/14 0701  09/03/14 0522  NA 141  --  142 143 142 141  K 4.0  --  4.5 4.0 3.2* 3.7  CL 101  --  107 106 107 105  CO2 20  --  23 26 24 25   GLUCOSE 124*  --  123* 110* 141* 88  BUN 20  --  18 18 8 7   CREATININE 0.41*  --  0.41* 0.48* 0.47* 0.58  CALCIUM 8.6  --  7.9* 7.9* 8.1* 8.5  MG  --  2.0  --  2.0  --  1.9  PHOS  --  3.5  --  3.2  --   --    GFR Estimated Creatinine Clearance: 99.8 mL/min (by C-G  formula based on Cr of 0.58). Liver Function Tests:  Recent Labs Lab 08/31/14 1530 08/31/14 2033 09/01/14 0404  AST 120* 103* 96*  ALT 51 43 42  ALKPHOS 139* 109 101  BILITOT 1.1 1.2 1.1  PROT 7.5 6.3 6.1  ALBUMIN 3.2* 2.8* 2.7*    Recent Labs Lab 08/31/14 1546  AMMONIA 43   Coagulation profile  Recent Labs Lab 08/31/14 1530 08/31/14 2033  INR 1.38 1.48    CBC:  Recent Labs Lab 08/31/14 1533 08/31/14 2033 09/01/14 0404 09/02/14 0701 09/02/14 1717 09/03/14 0522  WBC 8.1 3.8* 2.6* 3.3*  --  5.3  NEUTROABS  --  2.8  --   --   --   --   HGB 11.2* 8.9* 8.2* 8.2* 8.5* 8.5*  HCT 33.2* 26.2* 23.9* 24.0* 25.1* 24.4*  MCV 103.8* 103.1* 102.6* 101.7*  --  101.7*  PLT 74* 31* 32* 38*  --  57*   Cardiac Enzymes:  Recent Labs Lab 08/31/14 1530  TROPONINI <0.30   Microbiology Recent Results (from the past 240 hour(s))  MRSA PCR Screening     Status: None   Collection Time: 08/31/14  7:44 PM  Result Value Ref Range Status   MRSA by PCR NEGATIVE NEGATIVE Final    Comment:        The GeneXpert MRSA Assay (FDA approved for NASAL specimens only), is one component of a comprehensive MRSA colonization surveillance program. It is not intended to diagnose MRSA infection nor to guide or monitor treatment for MRSA infections.      Discharge Instructions:   Discharge Instructions    Call MD for:  extreme fatigue    Complete by:  As directed      Call MD for:  persistant nausea and vomiting    Complete by:  As directed      Call MD for:    Complete by:  As directed   Persistent black or bloody stools.     Diet - low sodium heart healthy    Complete by:  As directed      Discharge instructions    Complete by:  As directed   Avoid alcohol use as this may further damage your liver resulting in liver failure.  You were cared for by Dr. Hillery Aldohristina Tallin Hart  (a hospitalist) during your hospital stay. If you have any questions about your discharge medications or the  care you received while you were in the hospital after you are discharged, you can call the unit and ask to speak with the hospitalist on call if the hospitalist that took care of you is not available. Once you are discharged, your primary care physician will handle any further medical issues. Please note that NO REFILLS for any discharge medications will be authorized once  you are discharged, as it is imperative that you return to your primary care physician (or establish a relationship with a primary care physician if you do not have one) for your aftercare needs so that they can reassess your need for medications and monitor your lab values.  Any outstanding tests can be reviewed by your PCP at your follow up visit.  It is also important to review any medicine changes with your PCP.  Please bring these d/c instructions with you to your next visit so your physician can review these changes with you.  If you do not have a primary care physician, you can call [phone] for a physician referral.  It is highly recommended that you obtain a PCP for hospital follow up.     Increase activity slowly    Complete by:  As directed             Medication List    STOP taking these medications        furosemide 20 MG tablet  Commonly known as:  LASIX      TAKE these medications        ciprofloxacin 500 MG tablet  Commonly known as:  CIPRO  Take 1 tablet (500 mg total) by mouth 2 (two) times daily.     citalopram 40 MG tablet  Commonly known as:  CELEXA  Take 1 tablet (40 mg total) by mouth daily.     folic acid 1 MG tablet  Commonly known as:  FOLVITE  Take 1 tablet (1 mg total) by mouth daily.     multivitamin with minerals Tabs tablet  Take 0.5 tablets by mouth daily.     nadolol 20 MG tablet  Commonly known as:  CORGARD  Take 1 tablet (20 mg total) by mouth daily.     pantoprazole 40 MG tablet  Commonly known as:  PROTONIX  Take 1 tablet (40 mg total) by mouth 2 (two) times daily.       thiamine 100 MG tablet  Take 1 tablet (100 mg total) by mouth daily.           Follow-up Information    Follow up with Charna ElizabethMANN,JYOTHI, MD.   Specialty:  Gastroenterology   Why:  Hospital follow up.   Contact information:   [address] [place] [address] [phone]       Follow up with  [facility].   Why:  in 2-3 weeks, to establish care.   Contact information:   [address] [place] [id number] [phone]       Time coordinating discharge: 25 minutes.  Signed:  Aziya Arena  Pager [phone] [facility] 09/03/2014, 12:17 PM

## 2014-09-03 NOTE — Plan of Care (Signed)
Problem: Phase I Progression Outcomes Goal: Voiding-avoid urinary catheter unless indicated Outcome: Completed/Met Date Met:  09/03/14

## 2014-09-03 NOTE — Progress Notes (Signed)
     [facility] Progress Note  Subjective:  Had 3 blackish, red colored stools last night (probably old blood).  Hgb remained stable this AM.  Feels good overall.  Objective:  Vital signs in last 24 hours: Temp:  [98.4 F (36.9 C)-99.9 F (37.7 C)] 99.9 F (37.7 C) (11/08 0616) Pulse Rate:  [68-81] 81 (11/08 0800) Resp:  [13-19] 18 (11/08 0616) BP: (99-112)/(64-78) 104/64 mmHg (11/08 0800) SpO2:  [95 %-97 %] 96 % (11/08 0616) Last BM Date: 09/02/14 General:  Alert, Well-developed, in NAD Heart:  Regular rate and rhythm; no murmurs Pulm:  CTAB.  No W/R/R. Abdomen:  Soft, non-distended. Normal bowel sounds.  Non-tender. Extremities:  Without edema. Neurologic:  Alert and  oriented x4;  grossly normal neurologically. Psych:  Alert and cooperative. Normal mood and affect.  Intake/Output from previous day: 11/07 0701 - 11/08 0700 In: 1775 [I.V.:1725; IV Piggyback:50] Out: 2700 [Urine:2700] Intake/Output this shift: Total I/O In: 620 [P.O.:320; I.V.:300] Out: 200 [Urine:200]  Lab Results:  Recent Labs  09/01/14 0404 09/02/14 0701 09/02/14 1717 09/03/14 0522  WBC 2.6* 3.3*  --  5.3  HGB 8.2* 8.2* 8.5* 8.5*  HCT 23.9* 24.0* 25.1* 24.4*  PLT 32* 38*  --  57*   BMET  Recent Labs  09/01/14 0404 09/02/14 0701 09/03/14 0522  NA 143 142 141  K 4.0 3.2* 3.7  CL 106 107 105  CO2 26 24 25   GLUCOSE 110* 141* 88  BUN 18 8 7   CREATININE 0.48* 0.47* 0.58  CALCIUM 7.9* 8.1* 8.5   LFT  Recent Labs  08/31/14 1530  09/01/14 0404  PROT 7.5  < > 6.1  ALBUMIN 3.2*  < > 2.7*  AST 120*  < > 96*  ALT 51  < > 42  ALKPHOS 139*  < > 101  BILITOT 1.1  < > 1.1  BILIDIR 0.4*  --   --   IBILI 0.7  --   --   < > = values in this interval not displayed. PT/INR  Recent Labs  08/31/14 1530 08/31/14 2033  LABPROT 17.1* 18.1*  INR 1.38 1.48   Assessment / Plan: 1) Esophageal varices with bands placed x 5 on 11/6. Also found to have portal hypertensive  gastropathy. 2) Hematemesis with anemia: Secondary to above. Hgb stable from yesterday at 8.5 grams. 3) Alcoholic cirrhosis with history of ascites, abnormal LFT's, and thrombocytopenia.  4) Alcohol withdrawal-on Ativan.   *BID PPI and anti-reflux regimen. Avoid NSAID's.  *Nadolol 20 mg daily, may need to be increased eventually. *Needs to follow-up with Dr. Loreta AveMann with repeat EGD in 3 months as well. *Will get 4th dose of Rocephin today. When discharged he should be switched to cipro 500 mg BID to complete total of 7 day course. *Needs ETOH abstinence. *Ok for discharge from GI standpoint.   LOS: 3 days   Geral Coker D.  09/03/2014, 10:58 AM  Pager number [phone]

## 2014-09-03 NOTE — Discharge Summary (Signed)
Reviewed discharge instructions with patient and son including follow-up appointments, medications, and s/s of complications.  Also provided smoking cessation materials and list of resources for ETOH abuse.  Pt/son without questions regarding discharge instructions.  Pt being d/c into care of son.

## 2014-09-03 NOTE — Discharge Instructions (Signed)
Alcohol Use Disorder °Alcohol use disorder is a mental disorder. It is not a one-time incident of heavy drinking. Alcohol use disorder is the excessive and uncontrollable use of alcohol over time that leads to problems with functioning in one or more areas of daily living. People with this disorder risk harming themselves and others when they drink to excess. Alcohol use disorder also can cause other mental disorders, such as mood and anxiety disorders, and serious physical problems. People with alcohol use disorder often misuse other drugs.  °Alcohol use disorder is common and widespread. Some people with this disorder drink alcohol to cope with or escape from negative life events. Others drink to relieve chronic pain or symptoms of mental illness. People with a family history of alcohol use disorder are at higher risk of losing control and using alcohol to excess.  °SYMPTOMS  °Signs and symptoms of alcohol use disorder may include the following:  °· Consumption of alcohol in larger amounts or over a longer period of time than intended. °· Multiple unsuccessful attempts to cut down or control alcohol use.   °· A great deal of time spent obtaining alcohol, using alcohol, or recovering from the effects of alcohol (hangover). °· A strong desire or urge to use alcohol (cravings).   °· Continued use of alcohol despite problems at work, school, or home because of alcohol use.   °· Continued use of alcohol despite problems in relationships because of alcohol use. °· Continued use of alcohol in situations when it is physically hazardous, such as driving a car. °· Continued use of alcohol despite awareness of a physical or psychological problem that is likely related to alcohol use. Physical problems related to alcohol use can involve the brain, heart, liver, stomach, and intestines. Psychological problems related to alcohol use include intoxication, depression, anxiety, psychosis, delirium, and dementia.   °· The need for  increased amounts of alcohol to achieve the same desired effect, or a decreased effect from the consumption of the same amount of alcohol (tolerance). °· Withdrawal symptoms upon reducing or stopping alcohol use, or alcohol use to reduce or avoid withdrawal symptoms. Withdrawal symptoms include: °· Racing heart. °· Hand tremor. °· Difficulty sleeping. °· Nausea. °· Vomiting. °· Hallucinations. °· Restlessness. °· Seizures. °DIAGNOSIS °Alcohol use disorder is diagnosed through an assessment by your health care provider. Your health care provider may start by asking three or four questions to screen for excessive or problematic alcohol use. To confirm a diagnosis of alcohol use disorder, at least two symptoms must be present within a 12-month period. The severity of alcohol use disorder depends on the number of symptoms: °· Mild--two or three. °· Moderate--four or five. °· Severe--six or more. °Your health care provider may perform a physical exam or use results from lab tests to see if you have physical problems resulting from alcohol use. Your health care provider may refer you to a mental health professional for evaluation. °TREATMENT  °Some people with alcohol use disorder are able to reduce their alcohol use to low-risk levels. Some people with alcohol use disorder need to quit drinking alcohol. When necessary, mental health professionals with specialized training in substance use treatment can help. Your health care provider can help you decide how severe your alcohol use disorder is and what type of treatment you need. The following forms of treatment are available:  °· Detoxification. Detoxification involves the use of prescription medicines to prevent alcohol withdrawal symptoms in the first week after quitting. This is important for people with a history of symptoms   of withdrawal and for heavy drinkers who are likely to have withdrawal symptoms. Alcohol withdrawal can be dangerous and, in severe cases, cause  death. Detoxification is usually provided in a hospital or in-patient substance use treatment facility.  Counseling or talk therapy. Talk therapy is provided by substance use treatment counselors. It addresses the reasons people use alcohol and ways to keep them from drinking again. The goals of talk therapy are to help people with alcohol use disorder find healthy activities and ways to cope with life stress, to identify and avoid triggers for alcohol use, and to handle cravings, which can cause relapse.  Medicines.Different medicines can help treat alcohol use disorder through the following actions:  Decrease alcohol cravings.  Decrease the positive reward response felt from alcohol use.  Produce an uncomfortable physical reaction when alcohol is used (aversion therapy).  Support groups. Support groups are run by people who have quit drinking. They provide emotional support, advice, and guidance. These forms of treatment are often combined. Some people with alcohol use disorder benefit from intensive combination treatment provided by specialized substance use treatment centers. Both inpatient and outpatient treatment programs are available. Document Released: 11/20/2004 Document Revised: 02/27/2014 Document Reviewed: 01/20/2013 [facility] Patient Information 2015 [place], [place]. This information is not intended to replace advice given to you by your health care provider. Make sure you discuss any questions you have with your health care provider.  Esophageal Varices Esophageal varices are blood vessels in the esophagus (the tube that carries food to the stomach). Under normal circumstances, these blood vessels carry very small amounts of blood. If the liver is damaged, and the main vein (portal vein) that carries blood is blocked, larger amounts of blood might back up into these esophageal varices. The esophageal varices are too fragile for this extra blood flow and pressure. They may swell and  then break, causing life-threatening bleeding (hemorrhage). CAUSES  Any kind of liver disease can cause esophageal varices. Cirrhosis of the liver, usually due to alcoholism, is the most common reason. Other reasons include:  Severe heart failure: When the heart cannot pump blood around the body effectively enough, pressure may rise in the portal vein.  A blood clot in the portal vein.  Sarcoidosis. This is an inflammatory disease that can affect the liver.  Schistosomiasis. A parasitic infection that can cause liver damage. SYMPTOMS  Symptoms may include:  Vomiting bright red or black coffee ground like material.  Black, tarry stools.  Low blood pressure.  Dizziness.  Loss of consciousness. DIAGNOSIS  When someone has known cirrhosis, their caregiver may screen them for the presence of esophageal varices. Tests that are used include:  Endoscopy (esophagogastroduodenoscopy or EGD). A thin, lighted tube is inserted through the mouth and into the esophagus. The caregiver will rate the varices according to their size and the presence of red streaks. These characteristics help determine the risk of bleeding.  Imaging tests. CT scans and MRI scans can both show esophageal varices. However, they cannot predict likelihood of bleeding. TREATMENT  There are different types of treatment used for esophageal varices. These include:  Variceal ligation. During EGD, the caregiver places a rubber band around the vein to prevent bleeding.  Injection therapy. During EGD, the caregiver can inject the veins with a solution that shrinks them and scars them closed.  Medications can decrease the pressure in the esophageal varices and prevent bleeding.  Balloon tamponade. A tube is put into the esophagus and a balloon is passed down it. When  the balloon is inflated, it puts pressures on the veins and stops the bleeding.  Shunt. A small tube is placed within the liver veins. This decreases the blood  flow and pressure to the varices, decreasing bleeding risk.  Liver transplant may be done as a last resort. HOME CARE INSTRUCTIONS   Take all medications exactly as directed.  Follow any prescribed diet. Avoid alcohol if recommended.  Follow instructions regarding both rest and physical activity.  Seek help to treat a drinking problem. SEEK IMMEDIATE MEDICAL CARE IF:  You vomit blood or coffee-ground material.  You pass black tarry stools or bright red blood in the stools.  You are dizzy, lightheaded or faint.  You are unable to eat or drink.  You experience chest pain. Document Released: 01/03/2004 Document Revised: 01/05/2012 Document Reviewed: 12/14/2008 [facility] Patient Information 2015 [place], [place]. This information is not intended to replace advice given to you by your health care provider. Make sure you discuss any questions you have with your health care provider.

## 2014-09-04 ENCOUNTER — Encounter (HOSPITAL_COMMUNITY): Payer: Self-pay | Admitting: Gastroenterology

## 2014-12-28 ENCOUNTER — Other Ambulatory Visit: Payer: Self-pay | Admitting: Internal Medicine

## 2015-01-01 ENCOUNTER — Other Ambulatory Visit: Payer: Self-pay | Admitting: Internal Medicine

## 2016-01-24 ENCOUNTER — Encounter (HOSPITAL_COMMUNITY): Payer: Self-pay | Admitting: Emergency Medicine

## 2016-01-24 ENCOUNTER — Inpatient Hospital Stay (HOSPITAL_COMMUNITY)
Admission: EM | Admit: 2016-01-24 | Discharge: 2016-01-29 | DRG: 371 | Disposition: A | Discharge status: Home / Self Care | Payer: Self-pay | Attending: Internal Medicine | Admitting: Internal Medicine

## 2016-01-24 ENCOUNTER — Emergency Department (HOSPITAL_COMMUNITY): Payer: Self-pay

## 2016-01-24 ENCOUNTER — Inpatient Hospital Stay (HOSPITAL_COMMUNITY): Payer: Self-pay

## 2016-01-24 DIAGNOSIS — E876 Hypokalemia: Secondary | ICD-10-CM | POA: Diagnosis not present

## 2016-01-24 DIAGNOSIS — K559 Vascular disorder of intestine, unspecified: Secondary | ICD-10-CM | POA: Diagnosis present

## 2016-01-24 DIAGNOSIS — Z720 Tobacco use: Secondary | ICD-10-CM | POA: Diagnosis present

## 2016-01-24 DIAGNOSIS — I8500 Esophageal varices without bleeding: Secondary | ICD-10-CM | POA: Diagnosis present

## 2016-01-24 DIAGNOSIS — I81 Portal vein thrombosis: Secondary | ICD-10-CM | POA: Diagnosis present

## 2016-01-24 DIAGNOSIS — D696 Thrombocytopenia, unspecified: Secondary | ICD-10-CM | POA: Diagnosis present

## 2016-01-24 DIAGNOSIS — I8510 Secondary esophageal varices without bleeding: Secondary | ICD-10-CM | POA: Diagnosis present

## 2016-01-24 DIAGNOSIS — K652 Spontaneous bacterial peritonitis: Principal | ICD-10-CM | POA: Diagnosis present

## 2016-01-24 DIAGNOSIS — Z91040 Latex allergy status: Secondary | ICD-10-CM

## 2016-01-24 DIAGNOSIS — K921 Melena: Secondary | ICD-10-CM | POA: Diagnosis present

## 2016-01-24 DIAGNOSIS — K922 Gastrointestinal hemorrhage, unspecified: Secondary | ICD-10-CM

## 2016-01-24 DIAGNOSIS — K7030 Alcoholic cirrhosis of liver without ascites: Secondary | ICD-10-CM | POA: Diagnosis present

## 2016-01-24 DIAGNOSIS — N62 Hypertrophy of breast: Secondary | ICD-10-CM | POA: Diagnosis present

## 2016-01-24 DIAGNOSIS — R188 Other ascites: Secondary | ICD-10-CM | POA: Diagnosis present

## 2016-01-24 DIAGNOSIS — F17210 Nicotine dependence, cigarettes, uncomplicated: Secondary | ICD-10-CM | POA: Diagnosis present

## 2016-01-24 DIAGNOSIS — K7031 Alcoholic cirrhosis of liver with ascites: Secondary | ICD-10-CM | POA: Diagnosis present

## 2016-01-24 DIAGNOSIS — K766 Portal hypertension: Secondary | ICD-10-CM | POA: Diagnosis present

## 2016-01-24 DIAGNOSIS — K429 Umbilical hernia without obstruction or gangrene: Secondary | ICD-10-CM | POA: Diagnosis present

## 2016-01-24 DIAGNOSIS — R1084 Generalized abdominal pain: Secondary | ICD-10-CM

## 2016-01-24 DIAGNOSIS — K625 Hemorrhage of anus and rectum: Secondary | ICD-10-CM | POA: Diagnosis present

## 2016-01-24 LAB — RAPID URINE DRUG SCREEN, HOSP PERFORMED
Amphetamines: NOT DETECTED
BARBITURATES: NOT DETECTED
Benzodiazepines: NOT DETECTED
Cocaine: NOT DETECTED
Opiates: POSITIVE — AB
TETRAHYDROCANNABINOL: NOT DETECTED

## 2016-01-24 LAB — COMPREHENSIVE METABOLIC PANEL
ALBUMIN: 3.8 g/dL (ref 3.5–5.0)
ALK PHOS: 125 U/L (ref 38–126)
ALT: 19 U/L (ref 17–63)
ANION GAP: 11 (ref 5–15)
AST: 39 U/L (ref 15–41)
BILIRUBIN TOTAL: 2 mg/dL — AB (ref 0.3–1.2)
BUN: 5 mg/dL — ABNORMAL LOW (ref 6–20)
CALCIUM: 8.6 mg/dL — AB (ref 8.9–10.3)
CO2: 20 mmol/L — ABNORMAL LOW (ref 22–32)
Chloride: 105 mmol/L (ref 101–111)
Creatinine, Ser: 0.56 mg/dL — ABNORMAL LOW (ref 0.61–1.24)
GFR calc Af Amer: >60 mL/min (ref >60)
GLUCOSE: 111 mg/dL — AB (ref 65–99)
Potassium: 3.5 mmol/L (ref 3.5–5.1)
Sodium: 136 mmol/L (ref 135–145)
TOTAL PROTEIN: 7.8 g/dL (ref 6.5–8.1)

## 2016-01-24 LAB — CBC WITH DIFFERENTIAL/PLATELET
BASOS PCT: 0 %
Basophils Absolute: 0 10*3/uL (ref 0.0–0.1)
Basophils Absolute: 0 10*3/uL (ref 0.0–0.1)
Basophils Relative: 0 %
EOS ABS: 0 10*3/uL (ref 0.0–0.7)
EOS ABS: 0.1 10*3/uL (ref 0.0–0.7)
EOS PCT: 0 %
EOS PCT: 1 %
HCT: 40.9 % (ref 39.0–52.0)
HCT: 41.4 % (ref 39.0–52.0)
HEMOGLOBIN: 14 g/dL (ref 13.0–17.0)
Hemoglobin: 13.9 g/dL (ref 13.0–17.0)
LYMPHS ABS: 0.7 10*3/uL (ref 0.7–4.0)
Lymphocytes Relative: 9 %
Lymphocytes Relative: 8 %
Lymphs Abs: 0.6 10*3/uL — ABNORMAL LOW (ref 0.7–4.0)
MCH: 31.4 pg (ref 26.0–34.0)
MCH: 31.3 pg (ref 26.0–34.0)
MCHC: 34 g/dL (ref 30.0–36.0)
MCHC: 33.8 g/dL (ref 30.0–36.0)
MCV: 92.3 fL (ref 78.0–100.0)
MCV: 92.4 fL (ref 78.0–100.0)
MONO ABS: 0.4 10*3/uL (ref 0.1–1.0)
MONO ABS: 1.1 10*3/uL — AB (ref 0.1–1.0)
MONOS PCT: 5 %
MONOS PCT: 15 %
Neutro Abs: 6.3 10*3/uL (ref 1.7–7.7)
Neutro Abs: 5.2 10*3/uL (ref 1.7–7.7)
Neutrophils Relative %: 85 %
Neutrophils Relative %: 75 %
Platelets: 58 10*3/uL — ABNORMAL LOW (ref 150–400)
Platelets: 55 10*3/uL — ABNORMAL LOW (ref 150–400)
RBC: 4.43 MIL/uL (ref 4.22–5.81)
RBC: 4.48 MIL/uL (ref 4.22–5.81)
RDW: 14.4 % (ref 11.5–15.5)
RDW: 14.5 % (ref 11.5–15.5)
WBC: 7.3 10*3/uL (ref 4.0–10.5)
WBC: 6.9 10*3/uL (ref 4.0–10.5)

## 2016-01-24 LAB — POC OCCULT BLOOD, ED: Fecal Occult Bld: POSITIVE — AB

## 2016-01-24 LAB — RETICULOCYTES
RBC.: 4.52 MIL/uL (ref 4.22–5.81)
RETIC CT PCT: 1.3 % (ref 0.4–3.1)
Retic Count, Absolute: 58.8 10*3/uL (ref 19.0–186.0)

## 2016-01-24 LAB — LIPASE, BLOOD: LIPASE: 39 U/L (ref 11–51)

## 2016-01-24 LAB — LACTIC ACID, PLASMA
LACTIC ACID, VENOUS: 1.2 mmol/L (ref 0.5–2.0)
Lactic Acid, Venous: 1.3 mmol/L (ref 0.5–2.0)
Lactic Acid, Venous: 1 mmol/L (ref 0.5–2.0)
Lactic Acid, Venous: 1.1 mmol/L (ref 0.5–2.0)

## 2016-01-24 LAB — VITAMIN B12: Vitamin B-12: 246 pg/mL (ref 180–914)

## 2016-01-24 LAB — FOLATE: Folate: 16.6 ng/mL (ref >5.9)

## 2016-01-24 LAB — HEPARIN LEVEL (UNFRACTIONATED): HEPARIN UNFRACTIONATED: 0.25 [IU]/mL — AB (ref 0.30–0.70)

## 2016-01-24 LAB — MAGNESIUM: Magnesium: 1.8 mg/dL (ref 1.7–2.4)

## 2016-01-24 LAB — AMMONIA: Ammonia: 47 umol/L — ABNORMAL HIGH (ref 9–35)

## 2016-01-24 LAB — IRON AND TIBC
Iron: 50 ug/dL (ref 45–182)
Saturation Ratios: 15 % — ABNORMAL LOW (ref 17.9–39.5)
TIBC: 342 ug/dL (ref 250–450)
UIBC: 292 ug/dL

## 2016-01-24 LAB — CBC
HEMATOCRIT: 39.6 % (ref 39.0–52.0)
Hemoglobin: 13.4 g/dL (ref 13.0–17.0)
MCH: 31.2 pg (ref 26.0–34.0)
MCHC: 33.8 g/dL (ref 30.0–36.0)
MCV: 92.1 fL (ref 78.0–100.0)
PLATELETS: 58 10*3/uL — AB (ref 150–400)
RBC: 4.3 MIL/uL (ref 4.22–5.81)
RDW: 14.6 % (ref 11.5–15.5)
WBC: 7.5 10*3/uL (ref 4.0–10.5)

## 2016-01-24 LAB — FERRITIN: FERRITIN: 97 ng/mL (ref 24–336)

## 2016-01-24 LAB — PROTIME-INR
INR: 1.52 — AB (ref 0.00–1.49)
Prothrombin Time: 18.4 seconds — ABNORMAL HIGH (ref 11.6–15.2)

## 2016-01-24 LAB — MRSA PCR SCREENING: MRSA BY PCR: NEGATIVE

## 2016-01-24 LAB — TSH: TSH: 2.963 u[IU]/mL (ref 0.350–4.500)

## 2016-01-24 LAB — ETHANOL

## 2016-01-24 MED ORDER — ONDANSETRON HCL 4 MG/2ML IJ SOLN
4.0000 mg | Freq: Four times a day (QID) | INTRAMUSCULAR | Status: DC | PRN
  Administered 2016-01-24 – 2016-01-28 (×4): 4 mg via INTRAVENOUS
  Filled 2016-01-24 (×4): qty 2

## 2016-01-24 MED ORDER — LORAZEPAM 1 MG PO TABS
1.0000 mg | ORAL_TABLET | Freq: Four times a day (QID) | ORAL | Status: AC | PRN
  Administered 2016-01-26 – 2016-01-27 (×3): 1 mg via ORAL
  Filled 2016-01-24 (×3): qty 1

## 2016-01-24 MED ORDER — PIPERACILLIN-TAZOBACTAM 3.375 G IVPB
3.3750 g | Freq: Three times a day (TID) | INTRAVENOUS | Status: DC
  Administered 2016-01-24 – 2016-01-25 (×3): 3.375 g via INTRAVENOUS
  Filled 2016-01-24 (×5): qty 50

## 2016-01-24 MED ORDER — HEPARIN (PORCINE) IN NACL 100-0.45 UNIT/ML-% IJ SOLN
1600.0000 [IU]/h | INTRAMUSCULAR | Status: DC
  Administered 2016-01-24: 1200 [IU]/h via INTRAVENOUS
  Administered 2016-01-25 – 2016-01-29 (×6): 1600 [IU]/h via INTRAVENOUS
  Filled 2016-01-24 (×11): qty 250

## 2016-01-24 MED ORDER — IOPAMIDOL (ISOVUE-300) INJECTION 61%
INTRAVENOUS | Status: AC
  Filled 2016-01-24: qty 100

## 2016-01-24 MED ORDER — MORPHINE SULFATE (PF) 2 MG/ML IV SOLN
1.0000 mg | INTRAVENOUS | Status: DC | PRN
  Administered 2016-01-24 (×2): 1 mg via INTRAVENOUS
  Filled 2016-01-24 (×2): qty 1

## 2016-01-24 MED ORDER — PIPERACILLIN-TAZOBACTAM 3.375 G IVPB 30 MIN
3.3750 g | Freq: Once | INTRAVENOUS | Status: AC
  Administered 2016-01-24: 3.375 g via INTRAVENOUS
  Filled 2016-01-24: qty 50

## 2016-01-24 MED ORDER — FOLIC ACID 1 MG PO TABS
1.0000 mg | ORAL_TABLET | Freq: Every day | ORAL | Status: DC
  Administered 2016-01-24 – 2016-01-29 (×6): 1 mg via ORAL
  Filled 2016-01-24 (×6): qty 1

## 2016-01-24 MED ORDER — ONDANSETRON HCL 4 MG/2ML IJ SOLN: 4.0000 mg | Freq: Once | INTRAMUSCULAR | Status: DC

## 2016-01-24 MED ORDER — HEPARIN BOLUS VIA INFUSION
4000.0000 [IU] | Freq: Once | INTRAVENOUS | Status: AC
  Administered 2016-01-24: 4000 [IU] via INTRAVENOUS
  Filled 2016-01-24: qty 4000

## 2016-01-24 MED ORDER — LORAZEPAM 2 MG/ML IJ SOLN: 1.0000 mg | Freq: Four times a day (QID) | INTRAMUSCULAR | Status: AC | PRN

## 2016-01-24 MED ORDER — IOPAMIDOL (ISOVUE-300) INJECTION 61%
100.0000 mL | Freq: Once | INTRAVENOUS | Status: AC | PRN
  Administered 2016-01-24: 100 mL via INTRAVENOUS

## 2016-01-24 MED ORDER — ADULT MULTIVITAMIN W/MINERALS CH: 1.0000 | ORAL_TABLET | Freq: Every day | ORAL | Status: DC

## 2016-01-24 MED ORDER — DEXTROSE 5 % IV SOLN
1.0000 g | INTRAVENOUS | Status: DC
  Administered 2016-01-24: 1 g via INTRAVENOUS
  Filled 2016-01-24: qty 10

## 2016-01-24 MED ORDER — VITAMIN B-1 100 MG PO TABS
100.0000 mg | ORAL_TABLET | Freq: Every day | ORAL | Status: DC
  Administered 2016-01-24 – 2016-01-29 (×6): 100 mg via ORAL
  Filled 2016-01-24 (×6): qty 1

## 2016-01-24 MED ORDER — NICOTINE 14 MG/24HR TD PT24
14.0000 mg | MEDICATED_PATCH | Freq: Once | TRANSDERMAL | Status: AC
  Administered 2016-01-24: 14 mg via TRANSDERMAL
  Filled 2016-01-24: qty 1

## 2016-01-24 MED ORDER — PANTOPRAZOLE SODIUM 40 MG PO TBEC
40.0000 mg | DELAYED_RELEASE_TABLET | Freq: Once | ORAL | Status: AC
  Administered 2016-01-24: 40 mg via ORAL
  Filled 2016-01-24: qty 1

## 2016-01-24 MED ORDER — THIAMINE HCL 100 MG/ML IJ SOLN: 100.0000 mg | Freq: Every day | INTRAMUSCULAR | Status: DC

## 2016-01-24 MED ORDER — SODIUM CHLORIDE 0.9 % IV SOLN
INTRAVENOUS | Status: DC
  Administered 2016-01-24 – 2016-01-25 (×2): via INTRAVENOUS

## 2016-01-24 MED ORDER — LORAZEPAM 2 MG/ML IJ SOLN: 0.0000 mg | Freq: Four times a day (QID) | INTRAMUSCULAR | Status: AC

## 2016-01-24 MED ORDER — HYDROMORPHONE HCL 1 MG/ML IJ SOLN
1.0000 mg | Freq: Once | INTRAMUSCULAR | Status: AC
  Administered 2016-01-24: 1 mg via INTRAVENOUS
  Filled 2016-01-24: qty 1

## 2016-01-24 MED ORDER — PROCHLORPERAZINE EDISYLATE 5 MG/ML IJ SOLN
10.0000 mg | Freq: Four times a day (QID) | INTRAMUSCULAR | Status: DC | PRN
  Filled 2016-01-24: qty 2

## 2016-01-24 MED ORDER — ACETAMINOPHEN 325 MG PO TABS: 650.0000 mg | ORAL_TABLET | Freq: Four times a day (QID) | ORAL | Status: DC | PRN

## 2016-01-24 MED ORDER — LORAZEPAM 2 MG/ML IJ SOLN
0.0000 mg | Freq: Two times a day (BID) | INTRAMUSCULAR | Status: AC
Start: 2016-01-26 — End: 2016-01-28
  Administered 2016-01-28: 2 mg via INTRAVENOUS
  Filled 2016-01-24: qty 1

## 2016-01-24 NOTE — Progress Notes (Signed)
Pharmacy Antibiotic Note  Meriam SpragueSamuel A Godlewski is a 55 y.o. male admitted on 01/24/2016 with intra-abdominal infection.  Pharmacy has been consulted for zosyn dosing.  Plan: Zosyn 3.375g IV q8h (4 hour infusion).  Monitor culture data, renal function and clinical course     Temp (24hrs), Avg:98.5 F (36.9 C), Min:98.5 F (36.9 C), Max:98.5 F (36.9 C)   Recent Labs Lab 01/24/16 1016 01/24/16 1336  WBC 7.3  --   CREATININE 0.56*  --   LATICACIDVEN  --  1.2    CrCl cannot be calculated (Unknown ideal weight.).    Allergies  Allergen Reactions  . Latex Swelling and Rash    Antimicrobials this admission: Zosyn 3/30 >>   Dose adjustments this admission: n/a  Microbiology results: 3/30 BCx: sent  UCx:    Sputum:    MRSA PCR:    Arlean Hoppingorey M. Newman PiesBall, PharmD, BCPS Clinical Pharmacist Pager [phone] 01/24/2016 4:37 PM

## 2016-01-24 NOTE — Consult Note (Addendum)
Referring Provider: Dr. Konrad Dolores Primary Care Physician:  No primary care provider on file. Primary Gastroenterologist:  Gentry Fitz  Reason for Consultation:  Cirrhosis; GI bleed  HPI: Kenneth [name] is a 55 y.o. male with alcoholic cirrhosis and history esophageal variceal bleeding in 2015 when he had band ligation by Dr. Loreta Ave. Reports stopping alcohol at that time until 02/05/17when his father died and he started back drinking 7 beers per day. Has been having diffuse abdominal pain that started last night without any associated fevers or chills. Has been having loose nonbloody stools for 3 weeks and then this morning saw a small amount of red tinge with his BM with wiping and also in the toilet. Denies rectal pain. Denies any N/V/hematemesis. Reports that his last paracentesis was in 2010. Hemodynamically stable on arrival. Oriented to person and place; time: "March twentieth, 2017." Has been noncompliant with diuretics he was prescribed in the past. Never f/u for repeat variceal banding. Denies NSAIDs. Reports trying to follow a low sodium diet.   Past Medical History  Diagnosis Date  . Alcohol abuse   . Polysubstance abuse   . H/O [facility] spotted fever     age 72 or 64  . Upper GI bleed 08/31/2014  . Esophageal varices (HCC) 09/02/2014  . Portal hypertension with esophageal varices (HCC) 09/02/2014  . Portal hypertensive gastropathy 09/02/2014  . Hiatal hernia 09/02/2014  . Thrombocytopenia (HCC) 09/02/2014    Past Surgical History  Procedure Laterality Date  . Eye surgery    . Esophagogastroduodenoscopy Left 09/01/2014    Procedure: ESOPHAGOGASTRODUODENOSCOPY (EGD);  Surgeon: Charna Elizabeth, MD;  Location: [facility];  Service: Endoscopy;  Laterality: Left;    Prior to Admission medications   Medication Sig Start Date End Date Taking? Authorizing Provider  Multiple Vitamin (MULTIVITAMIN WITH MINERALS) TABS tablet Take 0.5 tablets by mouth daily.    Historical Provider, MD     Scheduled Meds: . folic acid  1 mg Oral Daily  . iopamidol      . LORazepam  0-4 mg Intravenous Q6H   Followed by  . [START ON 01/26/2016] LORazepam  0-4 mg Intravenous Q12H  . nicotine  14 mg Transdermal Once  . ondansetron (ZOFRAN) IV  4 mg Intravenous Once  . thiamine  100 mg Oral Daily   Or  . thiamine  100 mg Intravenous Daily   Continuous Infusions: . sodium chloride 50 mL/hr at 01/24/16 1402  . cefTRIAXone (ROCEPHIN)  IV 1 g (01/24/16 1402)   PRN Meds:.LORazepam **OR** LORazepam, morphine injection, ondansetron (ZOFRAN) IV, prochlorperazine  Allergies as of 01/24/2016 - Review Complete 01/24/2016  Allergen Reaction Noted  . Latex Swelling and Rash 01/24/2016    No family history on file.  Social History   Social History  . Marital Status: Single    Spouse Name: N/A  . Number of Children: N/A  . Years of Education: N/A   Occupational History  . Not on file.   Social History Main Topics  . Smoking status: Current Every Day Smoker -- 1.00 packs/day for 2 years    Types: Cigarettes  . Smokeless tobacco: Never Used  . Alcohol Use: Yes     Comment: 7 beers per day  . Drug Use: Yes    Special: Marijuana     Comment: occasional marijuana  . Sexual Activity: Not on file   Other Topics Concern  . Not on file   Social History Narrative    Review of Systems: All negative  except as stated above in HPI.  Physical Exam: Vital signs: Filed Vitals:   01/24/16 1330 01/24/16 1332  BP: 129/85 129/85  Pulse: 84 78  Temp:  98.5  Resp: 24      General:   Lethargic, thin, no acute distress Head: atraumatic Eyes: +scleral icterus ENT: oropharynx clear Neck: supple, nontender Lungs:  Clear throughout to auscultation.   No wheezes, crackles, or rhonchi. No acute distress. Heart:  Regular rate and rhythm; no murmurs, clicks, rubs,  or gallops. Abdomen: distended; right-sided tenderness with guarding otherwise nontender, +BS  Rectal:  Deferred Ext: no  edema Skin: +spider angiomas Neuro: oriented to person and place; no asterixis  GI:  Lab Results:  Recent Labs  01/24/16 1016  WBC 7.3  HGB 13.9  HCT 40.9  PLT 58*   BMET  Recent Labs  01/24/16 1016  NA 136  K 3.5  CL 105  CO2 20*  GLUCOSE 111*  BUN 5*  CREATININE 0.56*  CALCIUM 8.6*   LFT  Recent Labs  01/24/16 1016  PROT 7.8  ALBUMIN 3.8  AST 39  ALT 19  ALKPHOS 125  BILITOT 2.0*   PT/INR  Recent Labs  01/24/16 1016  LABPROT 18.4*  INR 1.52*     Studies/Results: Dg Chest 2 View  01/24/2016  CLINICAL DATA:  55 year old male with abdominal pain and distention for 4 days with nausea vomiting diarrhea. Intermittent cough. Initial encounter. Smoker. EXAM: CHEST  2 VIEW COMPARISON:  Abdominal series [id number] 08/31/2014 and earlier FINDINGS: Upper limits of normal to mildly hyperinflated lungs. Normal cardiac size and mediastinal contours. Visualized tracheal air column is within normal limits. No pneumothorax or pneumoperitoneum. No pulmonary edema, pleural effusion or confluent pulmonary opacity. Mildly increased interstitial markings bilaterally. No acute osseous abnormality identified. Calcified aortic atherosclerosis. IMPRESSION: Mild hyperinflation. Bilateral increased interstitial opacity could reflect viral/atypical respiratory infection, or be chronic. No other acute cardiopulmonary abnormality. Electronically Signed   By: Odessa FlemingH  Hall M.D.   On: 01/24/2016 10:12    Impression/Plan: 55 yo with decompensated cirrhosis and CT showing thrombosis of the SMV with small bowel ischemia in the right abdomen. Nonocclusive thrombus seen in the portal vein. Rectal bleeding that he is having could be related to this finding or could be a rectal outlet source. Has ascites on CT as well. In light of CT findings agree with surgical consult, empiric broad-spectrum Abx for SBP and hold off on a paracentesis at this time. Clinically he is not exhibiting signs of sepsis or SIRS but  at increased risk for progression. Agree with anticoagulation for thrombosis per surgery's recs. NPO. Supportive care. [facility] will follow.     LOS: 0 days   Davida Falconi C.  01/24/2016, 3:55 PM  Pager [phone]  If no answer or after 5 PM call [phone]

## 2016-01-24 NOTE — Progress Notes (Addendum)
ANTICOAGULATION CONSULT NOTE - Follow Up Consult  Pharmacy Consult for Heparin  Indication: SMV thrombosis, Non-occlusive portal vein thrombus  Allergies  Allergen Reactions  . Latex Swelling and Rash   Patient Measurements: Height: 5' 6.93" (170 cm) Weight: 160 lb (72.576 kg) IBW/kg (Calculated) : 65.94  Vital Signs: Temp: 99.1 F (37.3 C) (03/30 1946) Temp Source: Oral (03/30 1946) BP: 117/76 mmHg (03/30 2100) Pulse Rate: 85 (03/30 2100)  Labs:  Recent Labs  01/24/16 1016 01/24/16 1628 01/24/16 2159 01/24/16 2217  HGB 13.9 14.0 13.4  --   HCT 40.9 41.4 39.6  --   PLT 58* 55* 58*  --   LABPROT 18.4*  --   --   --   INR 1.52*  --   --   --   HEPARINUNFRC  --   --   --  0.25*  CREATININE 0.56*  --   --   --     Estimated Creatinine Clearance: 98.4 mL/min (by C-G formula based on Cr of 0.56).    Assessment: Initial heparin level drawn a little early but still low, Hgb good, plts low at 58 likely secondary to extensive liver disease/chronic alcohol abuse, no bleeding issues per RN.   Goal of Therapy:  Heparin level 0.3-0.7 units/ml Monitor platelets by anticoagulation protocol: Yes   Plan:  -Increase heparin to 1450 units/hr -HL with AM labs  Abran DukeLedford, Kipling Graser 01/24/2016,11:13 PM  ============================== Addendum 4:11 AM HL remains low, was drawn around 4 hours after rate change, but will still increase some -Increase heparin to 1600 units/hr -1100 HL  Abran DukeJames Eugen Jeansonne, PharmD Clinical Pharmacist

## 2016-01-24 NOTE — ED Provider Notes (Signed)
Patient presents to the emergency room for evaluation of abdominal pain and gi bleed.  Denies any recent history of trauma. Patient states he has a history of liver disease, ascites and esophageal bleeding. Patient still does drink alcohol but not as much as he used to. He called EMS this morning and was brought into the emergency room  Physical Exam  There were no vitals taken for this visit.  Physical Exam  Constitutional: No distress.  HENT:  Head: Normocephalic and atraumatic.  Right Ear: External ear normal.  Left Ear: External ear normal.  Eyes: Conjunctivae are normal. Right eye exhibits no discharge. Left eye exhibits no discharge. No scleral icterus.  Neck: Neck supple. No tracheal deviation present.  Cardiovascular: Normal rate.   gynecomastia  Pulmonary/Chest: Effort normal. No stridor. No respiratory distress.  Abdominal: He exhibits distension. He exhibits no mass. There is tenderness. There is no rebound and no guarding.  Musculoskeletal: He exhibits no edema.  Neurological: He is alert. Cranial nerve deficit: no gross deficits.  Skin: Skin is warm and dry. No rash noted. He is not diaphoretic. There is pallor.  Psychiatric: He has a normal mood and affect.  Nursing note and vitals reviewed.   ED Course  Procedures Bedside fast performed by Dr. Festus AloeGoebel. Positive for free fluid which is consistent with ascites. Aortic diameter is less than 3 cm  MDM patient presents with abdominal pain . Differential includes bowel obstruction, colitis diverticulitis, peritonitis associated with ascites.  We'll need to monitor closely for persistent gi bleeding.  He does have a history of esophageal varices (according to the patient) .  Vitals stable at this point.  May require paracentesis for symptomatic ascites.     Linwood DibblesJon Grayce Budden, MD 01/24/16 [phone]

## 2016-01-24 NOTE — Progress Notes (Signed)
Received call from radiologist Dr. Sunday Spillersalessio. CT abdomen with contrast revealed extensive thrombus arising from the superior mesenteric vein extending to the portal vein causing associated small bowel ischemia. There was no SMA involvement. General surgery notified and consultation requested given the bowel ischemia. Note lactic acid is normal currently at 1.6 and patient with ongoing abdominal pain. Discussed with pharmacist and plan is to pursue continuous IV heparin with frequent monitoring of platelet counts noting if platelet count drops to 30,000 or less likely will need to discontinue heparin. We'll also follow serial CBC every 8-12 hours to monitor for bleeding. We'll also need serial lactates to determine if bowel ischemia is increasing. Also need to follow patient for increasing abdominal pain other signs of decompensation such as hypotension etc. Due to bowel ischemia and potential translocate gram-negative bacteria we will change previously ordered Rocephin for SBP to Zosyn.  Clarification: Rectal exam completed at time of admission was not documented in the physical exam-patient without any visible blood externally, DRE deferred since previously done by ER physician-note one visible external hemorrhoid and no other abnormal findings  Junious SilkAllison Raistlin Gum, ANP

## 2016-01-24 NOTE — ED Notes (Signed)
Patient transported to CT 

## 2016-01-24 NOTE — ED Provider Notes (Signed)
CSN: [id number]     Arrival date & time 01/24/16  0920 History   First MD Initiated Contact with Patient 01/24/16 [phone]     Chief Complaint  Patient presents with  . Abdominal Pain     (Consider location/radiation/quality/duration/timing/severity/associated sxs/prior Treatment) Patient is a 55 y.o. male presenting with abdominal pain. The history is provided by the patient and the EMS personnel.  Abdominal Pain Pain location:  Generalized Pain quality: pressure   Pain radiates to:  Does not radiate Pain severity:  Severe Onset quality:  Gradual Duration: For 1 week but much worse over past 24 hours. Timing:  Constant Progression:  Worsening Chronicity:  Recurrent Context: alcohol use   Context: not trauma   Context comment:  Hx of alcohol abuse, acities, esophageal varacies, and upper GI bleed Relieved by:  Nothing Worsened by:  Nothing tried Associated symptoms: diarrhea, hematochezia (blood in diarrhea this morning) and nausea   Associated symptoms: no chest pain, no constipation, no dysuria, no fever, no hematemesis, no hematuria, no shortness of breath, no sore throat and no vomiting     Past Medical History  Diagnosis Date  . Alcohol abuse   . Polysubstance abuse   . H/O [facility] spotted fever     age 15 or 7  . Upper GI bleed 08/31/2014  . Esophageal varices (HCC) 09/02/2014  . Portal hypertension with esophageal varices (HCC) 09/02/2014  . Portal hypertensive gastropathy 09/02/2014  . Hiatal hernia 09/02/2014  . Thrombocytopenia (HCC) 09/02/2014   Past Surgical History  Procedure Laterality Date  . Eye surgery    . Esophagogastroduodenoscopy Left 09/01/2014    Procedure: ESOPHAGOGASTRODUODENOSCOPY (EGD);  Surgeon: Charna Elizabeth, MD;  Location: [facility];  Service: Endoscopy;  Laterality: Left;   No family history on file. Social History  Substance Use Topics  . Smoking status: Current Every Day Smoker -- 1.00 packs/day for 2 years    Types: Cigarettes  .  Smokeless tobacco: Never Used  . Alcohol Use: Yes     Comment: 7 beers per day    Review of Systems  Constitutional: Negative for fever, diaphoresis, activity change and appetite change.  HENT: Negative for facial swelling, sore throat, tinnitus, trouble swallowing and voice change.   Eyes: Negative for pain, redness and visual disturbance.  Respiratory: Negative for chest tightness, shortness of breath and wheezing.   Cardiovascular: Negative for chest pain, palpitations and leg swelling.  Gastrointestinal: Positive for nausea, abdominal pain, diarrhea, blood in stool, hematochezia (blood in diarrhea this morning) and abdominal distention. Negative for vomiting, constipation and hematemesis.  Endocrine: Negative.   Genitourinary: Negative.  Negative for dysuria, hematuria, decreased urine volume, scrotal swelling and testicular pain.  Musculoskeletal: Negative for myalgias, back pain and gait problem.  Skin: Negative.  Negative for rash.  Neurological: Negative.  Negative for dizziness, tremors, weakness and headaches.  Psychiatric/Behavioral: Negative for suicidal ideas, hallucinations and self-injury. The patient is not nervous/anxious.       Allergies  Latex  Home Medications   Prior to Admission medications   Medication Sig Start Date End Date Taking? Authorizing Provider  Multiple Vitamin (MULTIVITAMIN WITH MINERALS) TABS tablet Take 0.5 tablets by mouth daily.    Historical Provider, MD   BP 129/85 mmHg  Pulse 78  Temp(Src) 98.5 F (36.9 C)  Resp 24  SpO2 94% Physical Exam  Constitutional: He is oriented to person, place, and time. He appears well-developed and well-nourished. No distress.  HENT:  Head: Normocephalic and atraumatic.  Right Ear:  External ear normal.  Left Ear: External ear normal.  Nose: Nose normal.  Eyes: Conjunctivae and EOM are normal. Pupils are equal, round, and reactive to light. No scleral icterus.  Neck: Normal range of motion. Neck supple.  No JVD present. No tracheal deviation present. No thyromegaly present.  Cardiovascular: Normal rate and intact distal pulses.   Pulmonary/Chest: Effort normal and breath sounds normal. No stridor. No respiratory distress. He has no wheezes. He has no rales.  Abdominal: Soft. Bowel sounds are normal. He exhibits distension and mass (reducible hernia). There is tenderness (diffuse). There is no rebound and no guarding.  Genitourinary: Penis normal. Rectal exam shows anal tone normal. Guaiac positive stool. No discharge found.  Musculoskeletal: Normal range of motion. He exhibits no edema or tenderness.  Neurological: He is alert and oriented to person, place, and time. No cranial nerve deficit. He exhibits normal muscle tone. Coordination normal.  5/5 strength in all 4 extremities. GCS 15.  Skin: Skin is warm and dry. No rash noted. He is not diaphoretic.  Psychiatric: He has a normal mood and affect. His behavior is normal.  Nursing note and vitals reviewed.   ED Course  Procedures (including critical care time) Labs Review Labs Reviewed  CBC WITH DIFFERENTIAL/PLATELET - Abnormal; Notable for the following:    Platelets 58 (*)    All other components within normal limits  COMPREHENSIVE METABOLIC PANEL - Abnormal; Notable for the following:    CO2 20 (*)    Glucose, Bld 111 (*)    BUN 5 (*)    Creatinine, Ser 0.56 (*)    Calcium 8.6 (*)    Total Bilirubin 2.0 (*)    All other components within normal limits  AMMONIA - Abnormal; Notable for the following:    Ammonia 47 (*)    All other components within normal limits  PROTIME-INR - Abnormal; Notable for the following:    Prothrombin Time 18.4 (*)    INR 1.52 (*)    All other components within normal limits  POC OCCULT BLOOD, ED - Abnormal; Notable for the following:    Fecal Occult Bld POSITIVE (*)    All other components within normal limits  CULTURE, BLOOD (ROUTINE X 2)  CULTURE, BLOOD (ROUTINE X 2)  GASTROINTESTINAL PANEL BY  PCR, STOOL (REPLACES STOOL CULTURE)  VITAMIN B12  FOLATE  IRON AND TIBC  FERRITIN  RETICULOCYTES  URINE RAPID DRUG SCREEN, HOSP PERFORMED  ETHANOL  TSH  LIPASE, BLOOD  LACTIC ACID, PLASMA  LACTIC ACID, PLASMA  CBC WITH DIFFERENTIAL/PLATELET  H PYLORI, IGM, IGG, IGA AB  MAGNESIUM  HIV ANTIBODY (ROUTINE TESTING)  HEPATITIS PANEL, ACUTE    Imaging Review Dg Chest 2 View  01/24/2016  CLINICAL DATA:  55 year old male with abdominal pain and distention for 4 days with nausea vomiting diarrhea. Intermittent cough. Initial encounter. Smoker. EXAM: CHEST  2 VIEW COMPARISON:  Abdominal series 16109 08/31/2014 and earlier FINDINGS: Upper limits of normal to mildly hyperinflated lungs. Normal cardiac size and mediastinal contours. Visualized tracheal air column is within normal limits. No pneumothorax or pneumoperitoneum. No pulmonary edema, pleural effusion or confluent pulmonary opacity. Mildly increased interstitial markings bilaterally. No acute osseous abnormality identified. Calcified aortic atherosclerosis. IMPRESSION: Mild hyperinflation. Bilateral increased interstitial opacity could reflect viral/atypical respiratory infection, or be chronic. No other acute cardiopulmonary abnormality. Electronically Signed   By: Odessa Fleming M.D.   On: 01/24/2016 10:12   I have personally reviewed and evaluated these images and lab results as part  of my medical decision-making.   EKG Interpretation   Date/Time:  Thursday January 24 2016 09:22:24 EDT Ventricular Rate:  68 PR Interval:  133 QRS Duration: 88 QT Interval:  434 QTC Calculation: 462 R Axis:   78 Text Interpretation:  Sinus rhythm Since last tracing rate faster  Confirmed by KNAPP  MD-J, JON ([id number]) on 01/24/2016 9:28:12 AM      MDM   Final diagnoses:  Lower GI bleed    The patient is a 55 year old male with a history of alcohol abuse and upper GI bleed who presents with 1 week of worsening abdominal distention as well as 1 day of  bloody diarrhea. Patient denies any emesis or hematemesis. Physical exam as above. Patient denies any fevers, has a normal white blood cell count of 7.3, does not appear toxic, and denies any infectious symptoms do not suspect acute spontaneous bacterial peritonitis at this time. Rectal exam reveals grossly positive bright red blood that is strongly guaiac positive. GI's consultation patient is admitted to hospitalist service for further management. Patient expresses understanding and agreement with this plan.  Patient seen with attending, Dr. Lynelle DoctorKnapp, who oversaw clinical decision making.     Lula OlszewskiMike Oluwatobi Ruppe, MD 01/24/16 1413  Linwood DibblesJon Knapp, MD 01/24/16 [phone]

## 2016-01-24 NOTE — H&P (Signed)
[facility] History and Physical                                                                                    Kenneth [name], is a 55 y.o. male  MRN: [id number]   DOB - [birth year]  Admit Date - 01/24/2016  Outpatient Primary MD for the patient is No primary care provider on file.  Referring MD: Lynelle Doctor / ER  Consulting M.D; GI called by EDP  PMH: Past Medical History  Diagnosis Date  . Alcohol abuse   . Polysubstance abuse   . H/O [facility] spotted fever     age 70 or 46  . Upper GI bleed 08/31/2014  . Esophageal varices (HCC) 09/02/2014  . Portal hypertension with esophageal varices (HCC) 09/02/2014  . Portal hypertensive gastropathy 09/02/2014  . Hiatal hernia 09/02/2014  . Thrombocytopenia (HCC) 09/02/2014      PSH: Past Surgical History  Procedure Laterality Date  . Eye surgery    . Esophagogastroduodenoscopy Left 09/01/2014    Procedure: ESOPHAGOGASTRODUODENOSCOPY (EGD);  Surgeon: Charna Elizabeth, MD;  Location: [facility];  Service: Endoscopy;  Laterality: Left;     CC:  Chief Complaint  Patient presents with  . Abdominal Pain     HPI: 55 year old male patient with known alcoholic cirrhosis with associated portal gastropathy, varices as well as chronic thrombocytopenia; also ongoing alcohol abuse and tobacco abuse, history of prior GI bleeding. Patient presents to the ER with initial complaints of abdominal discomfort with acute onset overnight between 3 and 4 AM that awakened him from sleep. He has not had any fevers or chills. For about 3 weeks he has noticed mushy nonbloody stools i.e. nonsolid stools. This morning prior to presenting to the ER he had a clear liquid redtinged BM without any clots. He did not have any rectal pain with passage of this bowel movement but continued to have increasing abdominal pain. He has not had a paracentesis since 2010, he has been around a family members with recent influenza but has not been around anyone with  enteritis-type symptoms. He admits to continued alcohol and tobacco abuse. States he has not been to the doctor in several years. He previously has been prescribed furosemide as well as beta blockers but when the prescriptions ran out he did not follow-up to obtain refills.  ER Evaluation and treatment: Afebrile-BP 133/80-pulse 65-respirations 17-room air saturations 98% EKG: Sinus rhythm with ventricular rate 60 bpm, QTC 462, no ischemic changes; elevated J point in inferolateral leads 2 View CXR: Mild hyperinflation with increased bilateral interstitial opacity in setting of abdominal distention and ascites likely mechanical in nature secondary to limited inspiratory effort Lab data: Na 136, K 3.5, BUN 5, Cr 0.56, glucose 111, LFTs normal except for total bilirubin 2.0, ammonia 47, WBC 7300 with elevated neutrophils 85% but absolute neutrophils normal at 6.3%, platelets 58,000 Dilaudid 1 mg IV 1 dose Nicotine patch 14 mg 1 dose Protonix 40 mg by mouth 1  Review of Systems   In addition to the HPI above,  No Fever-chills, myalgias or other constitutional symptoms No Headache, changes with Vision or hearing, new weakness, tingling, numbness in any extremity,  No problems swallowing food or Liquids, indigestion/reflux No Chest pain, Cough or Shortness of Breath, palpitations, orthopnea or DOE No melena or hematochezia, no dark tarry stools but has had one episode of clear red tinged diarrheal stool prior to presentation, Bowel movements have been mushy and nonbloody 3 weeks No dysuria, hematuria or flank pain No new skin rashes, lesions, masses or bruises, No new joints pains-aches No recent weight gain or loss No polyuria, polydypsia or polyphagia,  *A full 10 point Review of Systems was done, except as stated above, all other Review of Systems were negative.  Social History Social History  Substance Use Topics  . Smoking status: Current Every Day Smoker -- 1.00 packs/day for 2 years      Types: Cigarettes  . Smokeless tobacco: Never Used  . Alcohol Use: Yes; 6-8  12 ounce beers daily          Resides at: Private residence  Lives with: Alone  Ambulatory status: Without assistive devices   Family History No family history on file. No significant history such as heart disease, hypertension, diabetes or thyroid disease reported by patient   Prior to Admission medications   Medication Sig Start Date End Date Taking? Authorizing Provider  Multiple Vitamin (MULTIVITAMIN WITH MINERALS) TABS tablet Take 0.5 tablets by mouth daily.    Historical Provider, MD    Allergies  Allergen Reactions  . Latex Swelling and Rash    Physical Exam  Vitals  Blood pressure 129/85, pulse 78, temperature 98.5 F (36.9 C), resp. rate 17, SpO2 90 %.   General:  In no acute distress, appears chronically ill  Psych:  Normal affect, Denies Suicidal or Homicidal ideations, Awake Alert, Oriented X 3. Speech and thought patterns are clear and appropriate, no apparent short term memory deficits Note: patient's father recently died 2023-01-22 Neuro:   No focal neurological deficits, CN II through XII intact, Strength 5/5 all 4 extremities, Sensation intact all 4 extremities.  ENT:  Ears and Eyes appear Normal, Conjunctivae clear, PER. Moist oral mucosa without erythema or exudates.  Neck:  Supple, No lymphadenopathy appreciated  Respiratory:  Symmetrical chest wall movement, Good air movement bilaterally although somewhat diminished at bases, CTAB. Room Air  Breast: Bilateral gynecomastia without any nodules or firmness, no drainage from nipples  Cardiac:  RRR, No Murmurs, no LE edema noted, no JVD, No carotid bruits, peripheral pulses palpable at 2+  Abdomen:  Positive bowel sounds, Soft, diffusely tender with more focal tenderness over right upper quadrant-no guarding or rebounding, distended and consistent with ascites,  No masses appreciated; umbilical hernia is not incarcerated  and easily reducible although spontaneously reverts to protruding  Skin:  No Cyanosis, Normal Skin Turgor, No Skin Rash or Bruise.  Extremities: Symmetrical without obvious trauma or injury,  no effusions.  Data Review  CBC  Recent Labs Lab 01/24/16 1016  WBC 7.3  HGB 13.9  HCT 40.9  PLT 58*  MCV 92.3  MCH 31.4  MCHC 34.0  RDW 14.4  LYMPHSABS 0.7  MONOABS 0.4  EOSABS 0.0  BASOSABS 0.0    Chemistries   Recent Labs Lab 01/24/16 1016  NA 136  K 3.5  CL 105  CO2 20*  GLUCOSE 111*  BUN 5*  CREATININE 0.56*  CALCIUM 8.6*  AST 39  ALT 19  ALKPHOS 125  BILITOT 2.0*    CrCl cannot be calculated (Unknown ideal weight.).  No results for input(s): TSH, T4TOTAL, T3FREE, THYROIDAB in the last 72 hours.  Invalid input(s): FREET3  Coagulation profile  Recent Labs Lab 01/24/16 1016  INR 1.52*    No results for input(s): DDIMER in the last 72 hours.  Cardiac Enzymes No results for input(s): CKMB, TROPONINI, MYOGLOBIN in the last 168 hours.  Invalid input(s): CK  Invalid input(s): POCBNP  Urinalysis    Component Value Date/Time   COLORURINE ORANGE BIOCHEMICALS MAY BE AFFECTED BY COLOR* 04/08/2009 1430   APPEARANCEUR CLEAR 04/08/2009 1430   LABSPEC 1.032* 04/08/2009 1430   PHURINE 6.0 04/08/2009 1430   GLUCOSEU NEGATIVE 04/08/2009 1430   HGBUR NEGATIVE 04/08/2009 1430   BILIRUBINUR MODERATE* 04/08/2009 1430   KETONESUR 40* 04/08/2009 1430   PROTEINUR 30* 04/08/2009 1430   UROBILINOGEN 1.0 04/08/2009 1430   NITRITE NEGATIVE 04/08/2009 1430   LEUKOCYTESUR NEGATIVE 04/08/2009 1430    Imaging results:   Dg Chest 2 View  01/24/2016  CLINICAL DATA:  55 year old male with abdominal pain and distention for 4 days with nausea vomiting diarrhea. Intermittent cough. Initial encounter. Smoker. EXAM: CHEST  2 VIEW COMPARISON:  Abdominal series 69629 08/31/2014 and earlier FINDINGS: Upper limits of normal to mildly hyperinflated lungs. Normal cardiac size and  mediastinal contours. Visualized tracheal air column is within normal limits. No pneumothorax or pneumoperitoneum. No pulmonary edema, pleural effusion or confluent pulmonary opacity. Mildly increased interstitial markings bilaterally. No acute osseous abnormality identified. Calcified aortic atherosclerosis. IMPRESSION: Mild hyperinflation. Bilateral increased interstitial opacity could reflect viral/atypical respiratory infection, or be chronic. No other acute cardiopulmonary abnormality. Electronically Signed   By: Odessa Fleming M.D.   On: 01/24/2016 10:12     EKG: (Independently reviewed) Sinus rhythm with ventricular rate 60 bpm, QTC 462 bili 7, skin changes the elevated J point in inferolateral leads   Assessment & Plan  Principal Problem:   SBP / Ascites -Abrupt onset of abdominal pain in patient with known ascites so suspect infectious etiology -Admit to floor/Inpatient; he has no established primary care physician or gastroenterologist so reluctant to treat as an outpatient -IV Rocephin daily -Blood cultures -Check lipase and lactic acid -CT with contrast of abdomen and pelvis -Clear liquids only-if developed significant nausea and vomiting will need to make NPO -GI consultation (see below) -May need IR consultation for paracentesis; patient reports last paracentesis was in 2010  Active Problems:   Rectal bleed -Initially presented as GI bleeding with EDP noting frank red blood on DRE -On inspection of rectum does have hemorrhoidal tags but no evidence of clots or any fissures and has not had any rectal pain with defecation -Suspect may have a degree of constipation and/or enteritis noting abrupt onset of abdominal pain with 1 episode of clear red diarrheal stool; patient reports at least 3 weeks of mushy nonbloody stools and postprandial bloating -Checked gastrointestinal PCR     Alcoholic cirrhosis of liver without ascites /Portal hypertension with esophageal varices/ Thrombocytopenia   -Patient continues to drink 6-8 twelve ounce beers daily -CIWA -Patient previously had been on diuretics as well as beta blockers but one prescriptions ran out and did not seek refills nor additional medical attention -Check acute hepatitis panel as well as HIV -Check magnesium level, TSH, serum alcohol, anemia panel and urine drug screen -Platelet stable and at baseline less than 100,000 but greater than 35,000    Umbilical hernia -Currently not incarcerated on abdominal exam    Tobacco abuse -Nicotine patch    Gynecomastia, male -Patient reports that he typically has bilateral breast swelling and ascites are increased -Has not been on any  offensive medication such as psych meds or thiazide diuretics recently -Monitor for improvement after ascites decreased or resolved    DVT Prophylaxis: SCDs  Family Communication:   No family at bedside  Code Status:  Full code  Condition:  Stable  Discharge disposition: Anticipate discharge back to previous home environment once medically stable  Time spent in minutes : 60      Antrone [place] L. ANP on 01/24/2016 at 1:40 PM  You may contact me by going to [email or web address] - password [email or web address]  I am available from 7a-7p but please confirm I am on the schedule by going to Amion as above.   After 7p please contact night coverage person covering me after hours  [facility]

## 2016-01-24 NOTE — ED Notes (Signed)
Pt reports abd pain since yesterday at noon. Pt reports hx of ascites. Pt alert x4.

## 2016-01-24 NOTE — Progress Notes (Signed)
01/24/2016 Patient came from emergency room to 2central  At 1845. He is alert, oriented and ambulatory. Patient patient was admitted with small bowel ischemia. Patient have tenderness in abdomen. He is on heparin. MRSA swab and CHG bath was complete, place on enteric precaution when arrive on unit and he was place on telemetry. Patient have no wound. Bottom pink, but blanches. [facility] LLC[facility]adine Patriciaann Rabanal RN.

## 2016-01-24 NOTE — ED Notes (Signed)
Attempted report x1. 

## 2016-01-24 NOTE — Consult Note (Signed)
This patient with advanced liver disease and SMV and portal thrombosis is unfortunate, but there is no role for surgery unless he perforates and develops peritonitis.  He just needs to be adequately anticoagulated. Call us if he develops free air and peritonitis.  Marta LamasJames O. Gae BonWyatt, III, MD, FACS [phone]--pager [phone]--office [facility]

## 2016-01-24 NOTE — Progress Notes (Signed)
ANTICOAGULATION CONSULT NOTE - Initial Consult  Pharmacy Consult for Heparin Indication: thrombus  Allergies  Allergen Reactions  . Latex Swelling and Rash    Patient Measurements: Height: 5' 6.93" (170 cm) Weight: 160 lb (72.576 kg) IBW/kg (Calculated) : 65.94 Heparin Dosing Weight: 72.5 kg  Vital Signs: Temp: 98.5 F (36.9 C) (03/30 0920) BP: 129/85 mmHg (03/30 1332) Pulse Rate: 78 (03/30 1332)  Labs:  Recent Labs  01/24/16 1016  HGB 13.9  HCT 40.9  PLT 58*  LABPROT 18.4*  INR 1.52*  CREATININE 0.56*    CrCl cannot be calculated (Unknown ideal weight.).   Medical History: Past Medical History  Diagnosis Date  . Alcohol abuse   . Polysubstance abuse   . [facility]     [facility] 55 or [age]  . Upper GI bleed 08/31/2014  . Esophageal varices (HCC) 09/02/2014  . Portal hypertension with esophageal varices (HCC) 09/02/2014  . Portal hypertensive gastropathy 09/02/2014  . Hiatal hernia 09/02/2014  . Thrombocytopenia (HCC) 09/02/2014    Medications:   (Not in a hospital admission) Scheduled:  . folic acid  1 mg Oral Daily  . iopamidol      . LORazepam  0-4 mg Intravenous Q6H   Followed by  . [START ON 01/26/2016] LORazepam  0-4 mg Intravenous Q12H  . nicotine  14 mg Transdermal Once  . ondansetron (ZOFRAN) IV  4 mg Intravenous Once  . thiamine  100 mg Oral Daily   Or  . thiamine  100 mg Intravenous Daily   Infusions:  . sodium chloride 50 mL/hr at 01/24/16 1402  . piperacillin-tazobactam      Assessment: 55yo male with history of EtOH abuse, upper GIB, portal HTN with esophageal varices presents with abdominal pain. Pharmacy is consulted to dose heparin for extensive thrombus from superior mesenteric vein extending to portal vein.  Goal of Therapy:  Heparin level 0.3-0.7 units/ml Monitor platelets by anticoagulation protocol: Yes   Plan:  Give 4000 units bolus x 1 Start heparin infusion at 1200 units/hr Check anti-Xa level in 6 hours  and daily while on heparin Continue to monitor H&H and platelets  Arlean Hoppingorey M. Newman PiesBall, PharmD, BCPS Clinical Pharmacist Pager [phone] 01/24/2016,4:38 PM

## 2016-01-25 DIAGNOSIS — K652 Spontaneous bacterial peritonitis: Principal | ICD-10-CM

## 2016-01-25 LAB — APTT: aPTT: 81 seconds — ABNORMAL HIGH (ref 24–37)

## 2016-01-25 LAB — CBC
HEMATOCRIT: 40 % (ref 39.0–52.0)
Hemoglobin: 13.5 g/dL (ref 13.0–17.0)
MCH: 31.3 pg (ref 26.0–34.0)
MCHC: 33.8 g/dL (ref 30.0–36.0)
MCV: 92.8 fL (ref 78.0–100.0)
Platelets: 58 10*3/uL — ABNORMAL LOW (ref 150–400)
RBC: 4.31 MIL/uL (ref 4.22–5.81)
RDW: 14.5 % (ref 11.5–15.5)
WBC: 6.4 10*3/uL (ref 4.0–10.5)

## 2016-01-25 LAB — COMPREHENSIVE METABOLIC PANEL
ALBUMIN: 3.3 g/dL — AB (ref 3.5–5.0)
ALT: 17 U/L (ref 17–63)
AST: 31 U/L (ref 15–41)
Alkaline Phosphatase: 107 U/L (ref 38–126)
Anion gap: 11 (ref 5–15)
BILIRUBIN TOTAL: 2 mg/dL — AB (ref 0.3–1.2)
BUN: 5 mg/dL — AB (ref 6–20)
CHLORIDE: 103 mmol/L (ref 101–111)
CO2: 21 mmol/L — AB (ref 22–32)
Calcium: 8.4 mg/dL — ABNORMAL LOW (ref 8.9–10.3)
Creatinine, Ser: 0.58 mg/dL — ABNORMAL LOW (ref 0.61–1.24)
GFR calc Af Amer: >60 mL/min (ref >60)
GFR calc non Af Amer: >60 mL/min (ref >60)
GLUCOSE: 86 mg/dL (ref 65–99)
POTASSIUM: 3.5 mmol/L (ref 3.5–5.1)
SODIUM: 135 mmol/L (ref 135–145)
TOTAL PROTEIN: 7.1 g/dL (ref 6.5–8.1)

## 2016-01-25 LAB — HEPARIN LEVEL (UNFRACTIONATED)
HEPARIN UNFRACTIONATED: 0.26 [IU]/mL — AB (ref 0.30–0.70)
HEPARIN UNFRACTIONATED: 0.36 [IU]/mL (ref 0.30–0.70)

## 2016-01-25 LAB — H PYLORI, IGM, IGG, IGA AB: H. Pylogi, Iga Abs: <9 units (ref 0.0–8.9)

## 2016-01-25 LAB — PROTIME-INR
INR: 1.59 — ABNORMAL HIGH (ref 0.00–1.49)
Prothrombin Time: 19 seconds — ABNORMAL HIGH (ref 11.6–15.2)

## 2016-01-25 LAB — HEPATITIS PANEL, ACUTE
HEP B S AG: NEGATIVE
Hep A IgM: NEGATIVE
Hep B C IgM: NEGATIVE

## 2016-01-25 LAB — HIV ANTIBODY (ROUTINE TESTING W REFLEX): HIV Screen 4th Generation wRfx: NONREACTIVE

## 2016-01-25 LAB — LACTIC ACID, PLASMA: Lactic Acid, Venous: 1 mmol/L (ref 0.5–2.0)

## 2016-01-25 MED ORDER — NICOTINE 14 MG/24HR TD PT24
14.0000 mg | MEDICATED_PATCH | Freq: Once | TRANSDERMAL | Status: AC
  Administered 2016-01-25: 14 mg via TRANSDERMAL
  Filled 2016-01-25: qty 1

## 2016-01-25 MED ORDER — PROPRANOLOL HCL 10 MG PO TABS
5.0000 mg | ORAL_TABLET | Freq: Three times a day (TID) | ORAL | Status: DC
  Administered 2016-01-25 – 2016-01-29 (×13): 5 mg via ORAL
  Filled 2016-01-25: qty 0.5
  Filled 2016-01-25: qty 1
  Filled 2016-01-25: qty 0.5
  Filled 2016-01-25: qty 1
  Filled 2016-01-25: qty 0.5
  Filled 2016-01-25: qty 1
  Filled 2016-01-25: qty 0.5
  Filled 2016-01-25: qty 1
  Filled 2016-01-25: qty 0.5
  Filled 2016-01-25 (×3): qty 1
  Filled 2016-01-25: qty 0.5
  Filled 2016-01-25: qty 1

## 2016-01-25 MED ORDER — SPIRONOLACTONE 25 MG PO TABS
12.5000 mg | ORAL_TABLET | Freq: Every day | ORAL | Status: DC
  Administered 2016-01-25 – 2016-01-26 (×2): 12.5 mg via ORAL
  Filled 2016-01-25 (×2): qty 1

## 2016-01-25 MED ORDER — ACETAMINOPHEN 325 MG PO TABS: 325.0000 mg | ORAL_TABLET | Freq: Four times a day (QID) | ORAL | Status: DC | PRN

## 2016-01-25 MED ORDER — CITALOPRAM HYDROBROMIDE 20 MG PO TABS
20.0000 mg | ORAL_TABLET | Freq: Every day | ORAL | Status: DC
  Administered 2016-01-25 – 2016-01-29 (×5): 20 mg via ORAL
  Filled 2016-01-25 (×5): qty 1

## 2016-01-25 MED ORDER — FUROSEMIDE 10 MG/ML IJ SOLN
40.0000 mg | Freq: Every day | INTRAMUSCULAR | Status: AC
  Administered 2016-01-25 – 2016-01-26 (×2): 40 mg via INTRAVENOUS
  Filled 2016-01-25 (×2): qty 4

## 2016-01-25 MED ORDER — DEXTROSE 5 % IV SOLN
1.0000 g | INTRAVENOUS | Status: DC
  Administered 2016-01-25 – 2016-01-27 (×3): 1 g via INTRAVENOUS
  Filled 2016-01-25 (×4): qty 10

## 2016-01-25 NOTE — Care Management Note (Signed)
Case Management Note  Patient Details  Name: Kenneth [name] MRN: [id number] Date of Birth: [birth year]  Subjective/Objective:       Adm w abd pain, on iv hep drip             Action/Plan: lives at home   Expected Discharge Date:                  Expected Discharge Plan:  Home w Home Health Services  In-House Referral:     Discharge planning Services     Post Acute Care Choice:    Choice offered to:     DME Arranged:    DME Agency:     HH Arranged:    HH Agency:     Status of Service:     Medicare Important Message Given:    Date Medicare IM Given:    Medicare IM give by:    Date Additional Medicare IM Given:    Additional Medicare Important Message give by:     If discussed at Long Length of Stay Meetings, dates discussed:    Additional Comments: ur review done  Hanley HaysDowell, Malic Rosten T, RN 01/25/2016, 8:29 AM

## 2016-01-25 NOTE — Progress Notes (Signed)
[place] TEAM 1 - Stepdown/ICU TEAM PROGRESS NOTE  Kenneth [name] OZH:[id number] DOB: 1961/02/05 DOA: 01/24/2016 PCP: No primary care provider on file.  Admit HPI / Brief Narrative: 55 year old male patient with known alcoholic cirrhosis with associated portal gastropathy, varices as well as chronic thrombocytopenia; also ongoing alcohol abuse and tobacco abuse, history of prior GI bleeding who presented to the ER with complaints of abdominal discomfort. Pprior to presenting to the ER he had a clear liquid red tinged BM without any clots. He did not have any rectal pain with passage of this bowel movement but continued to have increasing abdominal pain.   HPI/Subjective: The patient states he is feeling much better in general.  He denies current abdominal pain diarrhea chest pain or shortness of breath.  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 in the right abdomen + nonocclusive portal vein thrombus Continue anticoagulation with IV heparin - if remains stable initiate Coumadin therapy 01/26/2016 - outpatient treatment will be challenging/risky given ongoing alcohol abuse - plan to discontinue antibiotics after 5 day course  Ascites Appreciable on exam but presently not troublesome - follow clinically with addition of diuretics  Rectal bleed Appears to been a self-limited issue - no recurrence despite anticoagulation - follow  Alcoholic cirrhosis of liver with ascites / Portal hypertension with esophageal varices / Thrombocytopenia  continues to drink 6-8 twelve ounce beers daily - counseled patient on absolute need to discontinue alcohol abuse permanently - he reported that he drinks culture depression and that he has responded well to Celexa in the past - initiate Celexa  Umbilical hernia Currently not incarcerated on abdominal exam  Tobacco abuse Nicotine patch  Gynecomastia, male  Code Status: FULL Family Communication: no family present at time of  exam Disposition Plan: possible transfer to medical bed in AM if Hgb stable   Consultants: [facility]   Procedures: none  Antibiotics: Zosyn 3/30 > 3/31 Rocephin 3/31 >  DVT prophylaxis: SCDs  Objective: Blood pressure 108/76, pulse 72, temperature 98.7 F (37.1 C), temperature source Oral, resp. rate 16, height 5' 6.93" (1.7 m), weight 72.485 kg (159 lb 12.8 oz), SpO2 95 %.  Intake/Output Summary (Last 24 hours) at 01/25/16 1517 Last data filed at 01/25/16 0725  Gross per 24 hour  Intake 1136.33 ml  Output   1275 ml  Net -138.67 ml   Exam: General: No acute respiratory distress Lungs: Clear to auscultation bilaterally without wheezes or crackles Cardiovascular: Regular rate and rhythm without murmur gallop or rub normal S1 and S2 Abdomen: Nontender, distended w/ obvious ascites, soft, bowel sounds positive, no rebound Extremities: No significant cyanosis, clubbing, or edema bilateral lower extremities  Data Reviewed:  Basic Metabolic Panel:  Recent Labs Lab 01/24/16 1016 01/24/16 1336 01/25/16 0257  NA 136  --  135  K 3.5  --  3.5  CL 105  --  103  CO2 20*  --  21*  GLUCOSE 111*  --  86  BUN 5*  --  5*  CREATININE 0.56*  --  0.58*  CALCIUM 8.6*  --  8.4*  MG  --  1.8  --     CBC:  Recent Labs Lab 01/24/16 1016 01/24/16 1628 01/24/16 2159 01/25/16 0257  WBC 7.3 6.9 7.5 6.4  NEUTROABS 6.3 5.2  --   --   HGB 13.9 14.0 13.4 13.5  HCT 40.9 41.4 39.6 40.0  MCV 92.3 92.4 92.1 92.8  PLT 58* 55* 58* 58*  Liver Function Tests:  Recent Labs Lab 01/24/16 1016 01/25/16 0257  AST 39 31  ALT 19 17  ALKPHOS 125 107  BILITOT 2.0* 2.0*  PROT 7.8 7.1  ALBUMIN 3.8 3.3*    Recent Labs Lab 01/24/16 1336  LIPASE 39    Recent Labs Lab 01/24/16 1011  AMMONIA 47*    Coags:  Recent Labs Lab 01/24/16 1016 01/25/16 0257  INR 1.52* 1.59*    Recent Labs Lab 01/25/16 0257  APTT 81*    Recent Results (from the past 240  hour(s))  Culture, blood (Routine X 2) w Reflex to ID Panel     Status: None (Preliminary result)   Collection Time: 01/24/16  1:48 PM  Result Value Ref Range Status   Specimen Description BLOOD LEFT FOREARM  Final   Special Requests BOTTLES DRAWN AEROBIC ONLY 5CC  Final   Culture NO GROWTH < 24 HOURS  Final   Report Status PENDING  Incomplete  Culture, blood (Routine X 2) w Reflex to ID Panel     Status: None (Preliminary result)   Collection Time: 01/24/16  1:53 PM  Result Value Ref Range Status   Specimen Description BLOOD LEFT HAND  Final   Special Requests BOTTLES DRAWN AEROBIC ONLY 5CC  Final   Culture NO GROWTH < 24 HOURS  Final   Report Status PENDING  Incomplete  MRSA PCR Screening     Status: None   Collection Time: 01/24/16  7:17 PM  Result Value Ref Range Status   MRSA by PCR NEGATIVE NEGATIVE Final    Comment:        The GeneXpert MRSA Assay (FDA approved for NASAL specimens only), is one component of a comprehensive MRSA colonization surveillance program. It is not intended to diagnose MRSA infection nor to guide or monitor treatment for MRSA infections.      Studies:   Recent x-ray studies have been reviewed in detail by the Attending Physician  Scheduled Meds:  Scheduled Meds: . folic acid  1 mg Oral Daily  . LORazepam  0-4 mg Intravenous Q6H   Followed by  . [START ON 01/26/2016] LORazepam  0-4 mg Intravenous Q12H  . nicotine  14 mg Transdermal Once  . ondansetron (ZOFRAN) IV  4 mg Intravenous Once  . piperacillin-tazobactam (ZOSYN)  IV  3.375 g Intravenous Q8H  . thiamine  100 mg Oral Daily    Time spent on care of this patient: 35 mins   Hadja Harral T , MD   [facility] Office  [phone] Pager - Text Page per Loretha [email or web address] as per below:  On-Call/Text Page:      [email or web address]      password [email or web address]  If 7PM-7AM, please contact night-coverage [email or web address] Password [email or web address] 01/25/2016, 3:17 PM   LOS: 1 day

## 2016-01-25 NOTE — Progress Notes (Signed)
Patient ID: Kenneth [name], male   DOB: [birth year], 55 y.o.   MRN: [id number] [facility]  Kenneth [name] 55 y.o. [birth year]   Subjective: Feels good. Denies abdominal pain. No further bleeding. Feels slightly bloated.  Objective: Vital signs in last 24 hours: Filed Vitals:   01/25/16 0600 01/25/16 0856  BP: 108/65 108/76  Pulse: 79 72  Temp:  98.7 F (37.1 C)  Resp: 19 16    Physical Exam: Gen: alert, no acute distress HEENT: anicteric sclera CV: RRR Chest: CTA B Abd: soft, nontender, nondistended, +BS Ext: no edema  Lab Results:  Recent Labs  01/24/16 1016 01/24/16 1336 01/25/16 0257  NA 136  --  135  K 3.5  --  3.5  CL 105  --  103  CO2 20*  --  21*  GLUCOSE 111*  --  86  BUN 5*  --  5*  CREATININE 0.56*  --  0.58*  CALCIUM 8.6*  --  8.4*  MG  --  1.8  --     Recent Labs  01/24/16 1016 01/25/16 0257  AST 39 31  ALT 19 17  ALKPHOS 125 107  BILITOT 2.0* 2.0*  PROT 7.8 7.1  ALBUMIN 3.8 3.3*    Recent Labs  01/24/16 1016 01/24/16 1628 01/24/16 2159 01/25/16 0257  WBC 7.3 6.9 7.5 6.4  NEUTROABS 6.3 5.2  --   --   HGB 13.9 14.0 13.4 13.5  HCT 40.9 41.4 39.6 40.0  MCV 92.3 92.4 92.1 92.8  PLT 58* 55* 58* 58*    Recent Labs  01/24/16 1016 01/25/16 0257  LABPROT 18.4* 19.0*  INR 1.52* 1.59*      Assessment/Plan: 55 yo with decompensated alcoholic cirrhosis who had a CT that showed thrombosis of the SMV with extension into the portal vein concerning for small bowel ischemia. Rectal bleeding was minimal and has resolved and was likely due to a rectal outlet source. On broad spectrum Abx for possible SBP. On Heparin drip for his thrombosis. Tolerating clears. Would slowly advance diet tomorrow if stable. Consider transition to Coumadin in next 1-2 days if stable. Strongly advised to stop drinking. Dr. Dulce Sellarutlaw available to see this weekend if necessary. Will sign off. Call if questions.  Kenneth [name] C. 01/25/2016,  3:16 PM  Pager [phone]  If no answer or after 5 PM call [phone]

## 2016-01-25 NOTE — Progress Notes (Signed)
ANTICOAGULATION CONSULT NOTE  Pharmacy Consult for Heparin Indication: thrombus  Allergies  Allergen Reactions  . Latex Swelling and Rash    Patient Measurements: Height: 5' 6.93" (170 cm) Weight: 159 lb 12.8 oz (72.485 kg) IBW/kg (Calculated) : 65.94 Heparin Dosing Weight: 72.5 kg  Vital Signs: Temp: 98.7 F (37.1 C) (03/31 0856) Temp Source: Oral (03/31 0856) BP: 108/76 mmHg (03/31 0856) Pulse Rate: 72 (03/31 0856)  Labs:  Recent Labs  01/24/16 1016 01/24/16 1628 01/24/16 2159 01/24/16 2217 01/25/16 0257 01/25/16 0332 01/25/16 1044  HGB 13.9 14.0 13.4  --  13.5  --   --   HCT 40.9 41.4 39.6  --  40.0  --   --   PLT 58* 55* 58*  --  58*  --   --   APTT  --   --   --   --  81*  --   --   LABPROT 18.4*  --   --   --  19.0*  --   --   INR 1.52*  --   --   --  1.59*  --   --   HEPARINUNFRC  --   --   --  0.25*  --  0.26* 0.36  CREATININE 0.56*  --   --   --  0.58*  --   --     Estimated Creatinine Clearance: 98.4 mL/min (by C-G formula based on Cr of 0.58).    Assessment: 55yo male with history of EtOH abuse, upper GIB, portal HTN with esophageal varices presents with abdominal pain. Pharmacy is consulted to dose heparin for extensive thrombus from superior mesenteric vein extending to portal vein. HL therapeutic at 0.36 after rate increased to 1600 units/hr,  Hg WNL at 13.5, low pltc stable at 58 x 3 checks, his PLTC was 106 in 2013, so likely chronic thrombocytopenia.  No bleeding reported.   Goal of Therapy:  Heparin level 0.3-0.7 units/ml Monitor platelets by anticoagulation protocol: Yes   Plan:  Continue heparin drip at 1600 units/hr Daily HL/CBC F/u transition to oral agent  Herby AbrahamMichelle T. Bow Buntyn, Pharm.D. [phone] 01/25/2016 1:01 PM

## 2016-01-25 NOTE — Progress Notes (Signed)
Patient without abdominal pain.  Not indication for surgery.  Marta LamasJames O. Gae BonWyatt, III, MD, FACS [phone]--pager [phone]--office [facility]

## 2016-01-26 LAB — CBC
HCT: 37.2 % — ABNORMAL LOW (ref 39.0–52.0)
Hemoglobin: 12.4 g/dL — ABNORMAL LOW (ref 13.0–17.0)
MCH: 30.9 pg (ref 26.0–34.0)
MCHC: 33.3 g/dL (ref 30.0–36.0)
MCV: 92.8 fL (ref 78.0–100.0)
PLATELETS: 62 10*3/uL — AB (ref 150–400)
RBC: 4.01 MIL/uL — ABNORMAL LOW (ref 4.22–5.81)
RDW: 14.4 % (ref 11.5–15.5)
WBC: 5.6 10*3/uL (ref 4.0–10.5)

## 2016-01-26 LAB — COMPREHENSIVE METABOLIC PANEL
ALK PHOS: 89 U/L (ref 38–126)
ALT: 16 U/L — ABNORMAL LOW (ref 17–63)
ANION GAP: 8 (ref 5–15)
AST: 28 U/L (ref 15–41)
Albumin: 3.1 g/dL — ABNORMAL LOW (ref 3.5–5.0)
BUN: <5 mg/dL — ABNORMAL LOW (ref 6–20)
CALCIUM: 8.3 mg/dL — AB (ref 8.9–10.3)
CO2: 25 mmol/L (ref 22–32)
Chloride: 104 mmol/L (ref 101–111)
Creatinine, Ser: 0.67 mg/dL (ref 0.61–1.24)
GFR calc non Af Amer: >60 mL/min (ref >60)
Glucose, Bld: 84 mg/dL (ref 65–99)
Potassium: 3.2 mmol/L — ABNORMAL LOW (ref 3.5–5.1)
SODIUM: 137 mmol/L (ref 135–145)
Total Bilirubin: 1.3 mg/dL — ABNORMAL HIGH (ref 0.3–1.2)
Total Protein: 6.6 g/dL (ref 6.5–8.1)

## 2016-01-26 LAB — HEPARIN LEVEL (UNFRACTIONATED): Heparin Unfractionated: 0.41 IU/mL (ref 0.30–0.70)

## 2016-01-26 LAB — PROTIME-INR
INR: 1.52 — AB (ref 0.00–1.49)
PROTHROMBIN TIME: 18.4 s — AB (ref 11.6–15.2)

## 2016-01-26 MED ORDER — NICOTINE 14 MG/24HR TD PT24
14.0000 mg | MEDICATED_PATCH | Freq: Every day | TRANSDERMAL | Status: DC
  Administered 2016-01-26 – 2016-01-29 (×4): 14 mg via TRANSDERMAL
  Filled 2016-01-26 (×4): qty 1

## 2016-01-26 MED ORDER — WARFARIN - PHARMACIST DOSING INPATIENT: Freq: Every day | Status: DC

## 2016-01-26 MED ORDER — SPIRONOLACTONE 25 MG PO TABS
25.0000 mg | ORAL_TABLET | Freq: Every day | ORAL | Status: DC
  Administered 2016-01-27 – 2016-01-29 (×3): 25 mg via ORAL
  Filled 2016-01-26 (×3): qty 1

## 2016-01-26 MED ORDER — ACETAMINOPHEN 325 MG PO TABS: 325.0000 mg | ORAL_TABLET | Freq: Three times a day (TID) | ORAL | Status: DC | PRN

## 2016-01-26 MED ORDER — FUROSEMIDE 40 MG PO TABS
40.0000 mg | ORAL_TABLET | Freq: Two times a day (BID) | ORAL | Status: DC
  Administered 2016-01-27 – 2016-01-29 (×5): 40 mg via ORAL
  Filled 2016-01-26 (×5): qty 1

## 2016-01-26 MED ORDER — POTASSIUM CHLORIDE CRYS ER 20 MEQ PO TBCR
40.0000 meq | EXTENDED_RELEASE_TABLET | Freq: Once | ORAL | Status: AC
  Administered 2016-01-26: 40 meq via ORAL
  Filled 2016-01-26: qty 2

## 2016-01-26 MED ORDER — WARFARIN SODIUM 3 MG PO TABS
3.0000 mg | ORAL_TABLET | Freq: Once | ORAL | Status: AC
  Administered 2016-01-26: 3 mg via ORAL
  Filled 2016-01-26 (×2): qty 1

## 2016-01-26 NOTE — Progress Notes (Addendum)
ANTICOAGULATION CONSULT NOTE  Pharmacy Consult for Heparin Indication: thrombus  Allergies  Allergen Reactions  . Latex Swelling and Rash    Patient Measurements: Height: 5' 6.93" (170 cm) Weight: 157 lb 12.8 oz (71.578 kg) IBW/kg (Calculated) : 65.94 Heparin Dosing Weight: 72.5 kg  Vital Signs: Temp: 98.2 F (36.8 C) (04/01 1300) Temp Source: Oral (04/01 1300) BP: 107/69 mmHg (04/01 1300) Pulse Rate: 61 (04/01 1300)  Labs:  Recent Labs  01/24/16 1016  01/24/16 2159  01/25/16 0257 01/25/16 0332 01/25/16 1044 01/26/16 0311  HGB 13.9  < > 13.4  --  13.5  --   --  12.4*  HCT 40.9  < > 39.6  --  40.0  --   --  37.2*  PLT 58*  < > 58*  --  58*  --   --  62*  APTT  --   --   --   --  81*  --   --   --   LABPROT 18.4*  --   --   --  19.0*  --   --  18.4*  INR 1.52*  --   --   --  1.59*  --   --  1.52*  HEPARINUNFRC  --   --   --   < >  --  0.26* 0.36 0.41  CREATININE 0.56*  --   --   --  0.58*  --   --  0.67  < > = values in this interval not displayed.  Estimated Creatinine Clearance: 98.4 mL/min (by C-G formula based on Cr of 0.67).    Assessment: 55yo male with history of EtOH abuse, upper GIB, portal HTN with esophageal varices presents with abdominal pain. Pharmacy is consulted to dose heparin for extensive thrombus from superior mesenteric vein extending to portal vein.  HL therapeutic at 0.41 on rate of 1600 units/hr,  Hg OK, low pltc stable at 62, PLTC was 106 in 2013, so likely chronic thrombocytopenia.  No bleeding reported.  Pharmacy consulted to dose warfarin to start 4/1 per GI recommendations. INR 1.52. Will start lower dose due to baseline INR>1.4.  Goal of Therapy:  Heparin level 0.3-0.7 units/ml Monitor platelets by anticoagulation protocol: Yes   Plan:  Continue heparin drip at 1600 units/h Warfarin 3mg  x1 dose Daily HL/INR/CBC Monitor s/sx bleeding  Babs BertinHaley Wataru Mccowen, PharmD, [facility]intage[facility] Clinical Pharmacist Pager [phone] 01/26/2016 4:48 PM

## 2016-01-26 NOTE — Progress Notes (Signed)
[place] TEAM 1 - Stepdown/ICU TEAM PROGRESS NOTE  Kenneth [name] ZOX:[id number] DOB: 12/11/1960 DOA: 01/24/2016 PCP: No primary care provider on file.  Admit HPI / Brief Narrative: 55 year old male patient with known alcoholic cirrhosis with associated portal gastropathy, varices as well as chronic thrombocytopenia; also ongoing alcohol abuse and tobacco abuse, history of prior GI bleeding who presented to the ER with complaints of abdominal discomfort. Pprior to presenting to the ER he had a clear liquid red tinged BM without any clots. He did not have any rectal pain with passage of this bowel movement but continued to have increasing abdominal pain.   HPI/Subjective: The patient is doing well at the present time.  He denies abdominal pain shortness of breath nausea vomiting fevers or chills.  He and I had an extensive discussion concerning his liver disease during which I explained to him that he absolutely must discontinue alcohol abuse completely and entirely permanently.  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 in the right abdomen + nonocclusive portal vein thrombus Transition IV heparin to Coumadin w/ first does coumadin 01/26/2016 - outpatient treatment will be challenging/risky given ongoing alcohol abuse - plan to discontinue antibiotics after 5 day course  Ascites Appreciable on exam but presently not troublesome - follow clinically with addition of diuretics - titrate lasix and aldactone upward today Filed Weights   01/24/16 1652 01/25/16 0426 01/26/16 0417  Weight: 72.576 kg (160 lb) 72.485 kg (159 lb 12.8 oz) 71.578 kg (157 lb 12.8 oz)   Rectal bleed Appears to have been a self-limited issue - no recurrence despite anticoagulation - follow  Alcoholic cirrhosis of liver with ascites / Portal hypertension with esophageal varices / Thrombocytopenia  continues to drink 6-8 twelve ounce beers daily - counseled patient on absolute need to discontinue  alcohol abuse permanently - he reported that he drinks to counter depression and that he has responded well to Celexa in the past - initiated Celexa - MELD ~10 at present - discussed w/ pt that he may avoid fulminant hepatic failure if he stops drinking completely, but that should he continue he will definitely progress to the point of dying from cirrhosis, and that he will not be a candidate for transplant if he is drinking - also advised avoidance of APAP as possible, and use only in very low doses if required   Mild hypokalemia Worsened by diuresis - cont to replace and follow   Umbilical hernia Currently not incarcerated on abdominal exam  Tobacco abuse Nicotine patch  Gynecomastia, male  Code Status: FULL Family Communication: no family present at time of exam Disposition Plan: transfer to medical bed - begin coumadin - slowly advance diet - titrate diuretics   Consultants: [facility]   Procedures: none  Antibiotics: Zosyn 3/30 > 3/31 Rocephin 3/31 >  DVT prophylaxis: IV heparin   Objective: Blood pressure 107/69, pulse 65, temperature 98.2 F (36.8 C), temperature source Oral, resp. rate 20, height 5' 6.93" (1.7 m), weight 71.578 kg (157 lb 12.8 oz), SpO2 93 %.  Intake/Output Summary (Last 24 hours) at 01/26/16 1519 Last data filed at 01/26/16 0400  Gross per 24 hour  Intake    606 ml  Output   1110 ml  Net   -504 ml   Exam: General: No acute respiratory distress Lungs: Clear to auscultation bilaterally Cardiovascular: Regular rate and rhythm without murmur gallop or rub Abdomen: Nontender, distended w/ obvious ascites, soft, bowel sounds positive, no rebound  Extremities: No significant cyanosis, clubbing, edema bilateral lower extremities  Data Reviewed:  Basic Metabolic Panel:  Recent Labs Lab 01/24/16 1016 01/24/16 1336 01/25/16 0257 01/26/16 0311  NA 136  --  135 137  K 3.5  --  3.5 3.2*  CL 105  --  103 104  CO2 20*  --  21* 25    GLUCOSE 111*  --  86 84  BUN 5*  --  5* <5*  CREATININE 0.56*  --  0.58* 0.67  CALCIUM 8.6*  --  8.4* 8.3*  MG  --  1.8  --   --     CBC:  Recent Labs Lab 01/24/16 1016 01/24/16 1628 01/24/16 2159 01/25/16 0257 01/26/16 0311  WBC 7.3 6.9 7.5 6.4 5.6  NEUTROABS 6.3 5.2  --   --   --   HGB 13.9 14.0 13.4 13.5 12.4*  HCT 40.9 41.4 39.6 40.0 37.2*  MCV 92.3 92.4 92.1 92.8 92.8  PLT 58* 55* 58* 58* 62*    Liver Function Tests:  Recent Labs Lab 01/24/16 1016 01/25/16 0257 01/26/16 0311  AST 39 31 28  ALT 19 17 16*  ALKPHOS 125 107 89  BILITOT 2.0* 2.0* 1.3*  PROT 7.8 7.1 6.6  ALBUMIN 3.8 3.3* 3.1*    Recent Labs Lab 01/24/16 1336  LIPASE 39    Recent Labs Lab 01/24/16 1011  AMMONIA 47*    Coags:  Recent Labs Lab 01/24/16 1016 01/25/16 0257 01/26/16 0311  INR 1.52* 1.59* 1.52*    Recent Labs Lab 01/25/16 0257  APTT 81*    Recent Results (from the past 240 hour(s))  Culture, blood (Routine X 2) w Reflex to ID Panel     Status: None (Preliminary result)   Collection Time: 01/24/16  1:48 PM  Result Value Ref Range Status   Specimen Description BLOOD LEFT FOREARM  Final   Special Requests BOTTLES DRAWN AEROBIC ONLY 5CC  Final   Culture NO GROWTH 2 DAYS  Final   Report Status PENDING  Incomplete  Culture, blood (Routine X 2) w Reflex to ID Panel     Status: None (Preliminary result)   Collection Time: 01/24/16  1:53 PM  Result Value Ref Range Status   Specimen Description BLOOD LEFT HAND  Final   Special Requests BOTTLES DRAWN AEROBIC ONLY 5CC  Final   Culture NO GROWTH 2 DAYS  Final   Report Status PENDING  Incomplete  MRSA PCR Screening     Status: None   Collection Time: 01/24/16  7:17 PM  Result Value Ref Range Status   MRSA by PCR NEGATIVE NEGATIVE Final    Comment:        The GeneXpert MRSA Assay (FDA approved for NASAL specimens only), is one component of a comprehensive MRSA colonization surveillance program. It is not intended  to diagnose MRSA infection nor to guide or monitor treatment for MRSA infections.      Studies:   Recent x-ray studies have been reviewed in detail by the Attending Physician  Scheduled Meds:  Scheduled Meds: . cefTRIAXone (ROCEPHIN)  IV  1 g Intravenous Q24H  . citalopram  20 mg Oral Daily  . folic acid  1 mg Oral Daily  . furosemide  40 mg Intravenous Daily  . LORazepam  0-4 mg Intravenous Q12H  . propranolol  5 mg Oral TID  . spironolactone  12.5 mg Oral Daily  . thiamine  100 mg Oral Daily    Time spent on care of  this patient: 35 mins   Kenneth [name] , MD   [facility] Office  [phone] Pager - Text Page per Amion as per below:  On-Call/Text Page:      [email or web address]      password [email or web address]  If 7PM-7AM, please contact night-coverage [email or web address] Password [email or web address] 01/26/2016, 3:19 PM   LOS: 2 days

## 2016-01-26 NOTE — Progress Notes (Signed)
Pt transferred to 6N16 at 2105 off telemetry to MedSurg. Tech was in the room when this RN left the pt.

## 2016-01-27 DIAGNOSIS — K766 Portal hypertension: Secondary | ICD-10-CM

## 2016-01-27 DIAGNOSIS — I81 Portal vein thrombosis: Secondary | ICD-10-CM

## 2016-01-27 DIAGNOSIS — I8500 Esophageal varices without bleeding: Secondary | ICD-10-CM

## 2016-01-27 DIAGNOSIS — K559 Vascular disorder of intestine, unspecified: Secondary | ICD-10-CM

## 2016-01-27 DIAGNOSIS — D696 Thrombocytopenia, unspecified: Secondary | ICD-10-CM

## 2016-01-27 LAB — COMPREHENSIVE METABOLIC PANEL
ALBUMIN: 3 g/dL — AB (ref 3.5–5.0)
ALK PHOS: 88 U/L (ref 38–126)
ALT: 20 U/L (ref 17–63)
ANION GAP: 10 (ref 5–15)
AST: 36 U/L (ref 15–41)
BUN: <5 mg/dL — ABNORMAL LOW (ref 6–20)
CALCIUM: 8.6 mg/dL — AB (ref 8.9–10.3)
CO2: 22 mmol/L (ref 22–32)
Chloride: 106 mmol/L (ref 101–111)
Creatinine, Ser: 0.62 mg/dL (ref 0.61–1.24)
GFR calc Af Amer: >60 mL/min (ref >60)
GFR calc non Af Amer: >60 mL/min (ref >60)
GLUCOSE: 88 mg/dL (ref 65–99)
POTASSIUM: 3.6 mmol/L (ref 3.5–5.1)
SODIUM: 138 mmol/L (ref 135–145)
Total Bilirubin: 1.1 mg/dL (ref 0.3–1.2)
Total Protein: 6.4 g/dL — ABNORMAL LOW (ref 6.5–8.1)

## 2016-01-27 LAB — CBC
HEMATOCRIT: 34.9 % — AB (ref 39.0–52.0)
Hemoglobin: 11.9 g/dL — ABNORMAL LOW (ref 13.0–17.0)
MCH: 31.8 pg (ref 26.0–34.0)
MCHC: 34.1 g/dL (ref 30.0–36.0)
MCV: 93.3 fL (ref 78.0–100.0)
Platelets: 62 10*3/uL — ABNORMAL LOW (ref 150–400)
RBC: 3.74 MIL/uL — AB (ref 4.22–5.81)
RDW: 14.5 % (ref 11.5–15.5)
WBC: 4.7 10*3/uL (ref 4.0–10.5)

## 2016-01-27 LAB — PROTIME-INR
INR: 1.55 — ABNORMAL HIGH (ref 0.00–1.49)
Prothrombin Time: 18.6 seconds — ABNORMAL HIGH (ref 11.6–15.2)

## 2016-01-27 LAB — HEPARIN LEVEL (UNFRACTIONATED): HEPARIN UNFRACTIONATED: 0.49 [IU]/mL (ref 0.30–0.70)

## 2016-01-27 LAB — MAGNESIUM: Magnesium: 1.8 mg/dL (ref 1.7–2.4)

## 2016-01-27 MED ORDER — WARFARIN SODIUM 3 MG PO TABS
3.0000 mg | ORAL_TABLET | Freq: Once | ORAL | Status: AC
  Administered 2016-01-27: 3 mg via ORAL
  Filled 2016-01-27: qty 1

## 2016-01-27 MED ORDER — WARFARIN VIDEO
Freq: Once | Status: AC
  Administered 2016-01-27: 14:00:00

## 2016-01-27 MED ORDER — PATIENT'S GUIDE TO USING COUMADIN BOOK
Freq: Once | Status: AC
  Administered 2016-01-27: 14:00:00
  Filled 2016-01-27: qty 1

## 2016-01-27 NOTE — Discharge Instructions (Signed)

## 2016-01-27 NOTE — Progress Notes (Signed)
[place] TEAM 1 - Stepdown/ICU TEAM PROGRESS NOTE  Kenneth [name] AVW:[id number] DOB: [birth year] DOA: 01/24/2016 PCP: No primary care provider on file.  Admit HPI / Brief Narrative: 55 year old male patient with known alcoholic cirrhosis with associated portal gastropathy, varices as well as chronic thrombocytopenia; also ongoing alcohol abuse and tobacco abuse, history of prior GI bleeding who presented to the ER with complaints of abdominal discomfort. Pprior to presenting to the ER he had a clear liquid red tinged BM without any clots. He did not have any rectal pain with passage of this bowel movement but continued to have increasing abdominal pain.   HPI/Subjective: The patient is doing well at the present time.  He denies abdominal pain shortness of breath nausea vomiting fevers or chills.  He and I had an extensive discussion concerning his liver disease during which I explained to him that he absolutely must discontinue alcohol abuse completely and entirely permanently.  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 Nonocclusive thrombus extending into the portal vein. -He was started on anticoagulation with IV heparin, started on Coumadin receive his first dose on 01/26/2016. -A.m. labs on 01/27/2016 showing INR of 1.55 with PT of 18.6. Pharmacy consulted for warfarin dosing. Meanwhile he remains unhappy heparin -This is a difficult situation for Kenneth [name] having a history of advanced liver disease with esophageal varices and thrombocytopenia.  -We spoke about the risks and benefits of anticoagulation  Ascites Appreciable on exam but presently not troublesome - follow clinically with addition of diuretics - titrate  lasix and aldactone upward today Filed Weights   01/26/16 0417 01/26/16 2119 01/27/16 0621  Weight: 71.578 kg (157 lb 12.8 oz) 71 kg (156 lb 8.4 oz) 70.7 kg (155 lb 13.8 oz)   Rectal bleed Appears to have been a self-limited issue - no recurrence despite anticoagulation - follow  Alcoholic cirrhosis of liver with ascites / Portal hypertension with esophageal varices / Thrombocytopenia  continues to drink 6-8 twelve ounce beers daily - counseled patient on absolute need to discontinue alcohol abuse permanently - he reported that he drinks to counter depression and that he has responded well to Celexa in the past - initiated Celexa - MELD ~10 at present - discussed w/ pt that he may avoid fulminant hepatic failure if he stops drinking completely, but that should he continue he will definitely progress to the point of dying from cirrhosis, and that he will not be a candidate for transplant if he is drinking - also advised avoidance of APAP as possible, and use only in very low doses if required   Mild hypokalemia Worsened by diuresis - cont to replace and follow   Umbilical hernia Currently not incarcerated on abdominal exam  Tobacco abuse Nicotine patch  Gynecomastia, male  Code Status: FULL Family Communication: no family present at time of exam Disposition Plan: Anticipate discharge home when medically stable  Consultants: [facility]   Procedures: none  Antibiotics: Zosyn 3/30 > 3/31 Rocephin 3/31 >  DVT prophylaxis: Fully anticoagulated  Objective: Blood pressure 102/59, pulse 59, temperature 98.5 F (36.9 C), temperature source Oral, resp. rate 16, height 5\' 7"  (1.702 m), weight 70.7 kg (155 lb 13.8 oz), SpO2 97 %. No intake or output data  in the 24 hours ending 01/27/16 1247 Exam: General: No acute respiratory distress, thin appearing, cachectic, chronically ill-appearing Lungs: Clear to auscultation bilaterally Cardiovascular: Regular rate and rhythm  without murmur gallop or rub Abdomen: Nontender, distended w/ obvious ascites, soft, bowel sounds positive, no rebound Extremities: No significant cyanosis, clubbing, edema bilateral lower extremities  Data Reviewed:  Basic Metabolic Panel:  Recent Labs Lab 01/24/16 1016 01/24/16 1336 01/25/16 0257 01/26/16 0311 01/27/16 0702  NA 136  --  135 137 138  K 3.5  --  3.5 3.2* 3.6  CL 105  --  103 104 106  CO2 20*  --  21* 25 22  GLUCOSE 111*  --  86 84 88  BUN 5*  --  5* <5* <5*  CREATININE 0.56*  --  0.58* 0.67 0.62  CALCIUM 8.6*  --  8.4* 8.3* 8.6*  MG  --  1.8  --   --  1.8    CBC:  Recent Labs Lab 01/24/16 1016 01/24/16 1628 01/24/16 2159 01/25/16 0257 01/26/16 0311 01/27/16 0702  WBC 7.3 6.9 7.5 6.4 5.6 4.7  NEUTROABS 6.3 5.2  --   --   --   --   HGB 13.9 14.0 13.4 13.5 12.4* 11.9*  HCT 40.9 41.4 39.6 40.0 37.2* 34.9*  MCV 92.3 92.4 92.1 92.8 92.8 93.3  PLT 58* 55* 58* 58* 62* 62*    Liver Function Tests:  Recent Labs Lab 01/24/16 1016 01/25/16 0257 01/26/16 0311 01/27/16 0702  AST 39 31 28 36  ALT 19 17 16* 20  ALKPHOS 125 107 89 88  BILITOT 2.0* 2.0* 1.3* 1.1  PROT 7.8 7.1 6.6 6.4*  ALBUMIN 3.8 3.3* 3.1* 3.0*    Recent Labs Lab 01/24/16 1336  LIPASE 39    Recent Labs Lab 01/24/16 1011  AMMONIA 47*    Coags:  Recent Labs Lab 01/24/16 1016 01/25/16 0257 01/26/16 0311 01/27/16 0702  INR 1.52* 1.59* 1.52* 1.55*    Recent Labs Lab 01/25/16 0257  APTT 81*    Recent Results (from the past 240 hour(s))  Culture, blood (Routine X 2) w Reflex to ID Panel     Status: None (Preliminary result)   Collection Time: 01/24/16  1:48 PM  Result Value Ref Range Status   Specimen Description BLOOD LEFT FOREARM  Final   Special Requests BOTTLES DRAWN AEROBIC ONLY 5CC  Final   Culture NO GROWTH 2 DAYS  Final   Report Status PENDING  Incomplete  Culture, blood (Routine X 2) w Reflex to ID Panel     Status: None (Preliminary result)    Collection Time: 01/24/16  1:53 PM  Result Value Ref Range Status   Specimen Description BLOOD LEFT HAND  Final   Special Requests BOTTLES DRAWN AEROBIC ONLY 5CC  Final   Culture NO GROWTH 2 DAYS  Final   Report Status PENDING  Incomplete  MRSA PCR Screening     Status: None   Collection Time: 01/24/16  7:17 PM  Result Value Ref Range Status   MRSA by PCR NEGATIVE NEGATIVE Final    Comment:        The GeneXpert MRSA Assay (FDA approved for NASAL specimens only), is one component of a comprehensive MRSA colonization surveillance program. It is not intended to diagnose MRSA infection nor to guide or monitor treatment for MRSA infections.      Studies:   Recent x-ray studies have been reviewed in detail by the Attending Physician  Scheduled Meds:  Scheduled  Meds: . cefTRIAXone (ROCEPHIN)  IV  1 g Intravenous Q24H  . citalopram  20 mg Oral Daily  . folic acid  1 mg Oral Daily  . furosemide  40 mg Oral BID  . LORazepam  0-4 mg Intravenous Q12H  . nicotine  14 mg Transdermal Daily  . patient's guide to using coumadin book   Does not apply Once  . propranolol  5 mg Oral TID  . spironolactone  25 mg Oral Daily  . thiamine  100 mg Oral Daily  . warfarin  3 mg Oral ONCE-1800  . warfarin   Does not apply Once  . Warfarin - Pharmacist Dosing Inpatient   Does not apply q1800    Time spent on care of this patient: 25 mins   Jeralyn Bennett , MD   [facility] Office  [phone] Pager - Text Page per Amion as per below:  On-Call/Text Page:      [email or web address]      password [email or web address]  If 7PM-7AM, please contact night-coverage [email or web address] Password [email or web address] 01/27/2016, 12:47 PM   LOS: 3 days

## 2016-01-27 NOTE — Progress Notes (Signed)
ANTICOAGULATION CONSULT NOTE  Pharmacy Consult for Heparin and coumadin Indication: thrombus  Allergies  Allergen Reactions  . Latex Swelling and Rash    Patient Measurements: Height: 5\' 7"  (170.2 cm) Weight: 155 lb 13.8 oz (70.7 kg) IBW/kg (Calculated) : 66.1 Heparin Dosing Weight: 72.5 kg  Vital Signs: Temp: 98.5 F (36.9 C) (04/02 0621) Temp Source: Oral (04/02 0621) BP: 102/59 mmHg (04/02 0621) Pulse Rate: 59 (04/02 0621)  Labs:  Recent Labs  01/24/16 1016  01/25/16 0257  01/25/16 1044 01/26/16 0311 01/27/16 0702  HGB 13.9  < > 13.5  --   --  12.4* 11.9*  HCT 40.9  < > 40.0  --   --  37.2* 34.9*  PLT 58*  < > 58*  --   --  62* 62*  APTT  --   --  81*  --   --   --   --   LABPROT 18.4*  --  19.0*  --   --  18.4* 18.6*  INR 1.52*  --  1.59*  --   --  1.52* 1.55*  HEPARINUNFRC  --   < >  --   < > 0.36 0.41 0.49  CREATININE 0.56*  --  0.58*  --   --  0.67  --   < > = values in this interval not displayed.  Estimated Creatinine Clearance: 98.7 mL/min (by C-G formula based on Cr of 0.67).    Assessment: 55yo male with history of EtOH abuse, upper GIB, portal HTN with esophageal varices presents with abdominal pain. Pharmacy is consulted to dose heparin for extensive thrombus from superior mesenteric vein extending to portal vein. Coumadin started 4/1.  Baseline INR elevated at 1.52/1.59  HL therapeutic at 0.49 on rate of 1600 units/hr, INR 1.55 after 1 dose of coumadin 3 mg - this is same as his baseline INR.  Hg 11.9,  low pltc stable at 62, PLTC was 106 in 2013, so likely chronic thrombocytopenia.  No bleeding reported.  Pt with elevated baseline INR and PMH Etoh, GIB, esophageal varicies - agree with Dr. Sharon SellerMcClung that outpt anticoag will be challenging/riskly 2nd Etoh abuse.   Goal of Therapy:  Heparin level 0.3-0.7 units/ml Monitor platelets by anticoagulation protocol: Yes   Plan:  Continue heparin drip at 1600 units/hr Repeat Warfarin 3mg  x1 dose Coumadin  video and book for education Daily HL/INR/CBC Monitor s/sx bleeding  Herby AbrahamMichelle T. Sybol Morre, Pharm.D. [phone] 01/27/2016 8:33 AM

## 2016-01-28 LAB — BASIC METABOLIC PANEL
ANION GAP: 8 (ref 5–15)
BUN: <5 mg/dL — ABNORMAL LOW (ref 6–20)
CALCIUM: 8.5 mg/dL — AB (ref 8.9–10.3)
CO2: 22 mmol/L (ref 22–32)
CREATININE: 0.5 mg/dL — AB (ref 0.61–1.24)
Chloride: 109 mmol/L (ref 101–111)
Glucose, Bld: 108 mg/dL — ABNORMAL HIGH (ref 65–99)
Potassium: 3.4 mmol/L — ABNORMAL LOW (ref 3.5–5.1)
Sodium: 139 mmol/L (ref 135–145)

## 2016-01-28 LAB — CBC
HCT: 37.8 % — ABNORMAL LOW (ref 39.0–52.0)
Hemoglobin: 12.8 g/dL — ABNORMAL LOW (ref 13.0–17.0)
MCH: 31.7 pg (ref 26.0–34.0)
MCHC: 33.9 g/dL (ref 30.0–36.0)
MCV: 93.6 fL (ref 78.0–100.0)
PLATELETS: 60 10*3/uL — AB (ref 150–400)
RBC: 4.04 MIL/uL — ABNORMAL LOW (ref 4.22–5.81)
RDW: 14.5 % (ref 11.5–15.5)
WBC: 4 10*3/uL (ref 4.0–10.5)

## 2016-01-28 LAB — PROTIME-INR
INR: 1.47 (ref 0.00–1.49)
Prothrombin Time: 17.9 seconds — ABNORMAL HIGH (ref 11.6–15.2)

## 2016-01-28 LAB — HEPARIN LEVEL (UNFRACTIONATED): HEPARIN UNFRACTIONATED: 0.55 [IU]/mL (ref 0.30–0.70)

## 2016-01-28 MED ORDER — CIPROFLOXACIN HCL 500 MG PO TABS
500.0000 mg | ORAL_TABLET | Freq: Two times a day (BID) | ORAL | Status: DC
  Administered 2016-01-28 – 2016-01-29 (×2): 500 mg via ORAL
  Filled 2016-01-28 (×2): qty 1

## 2016-01-28 MED ORDER — LORAZEPAM 1 MG PO TABS
1.0000 mg | ORAL_TABLET | Freq: Once | ORAL | Status: AC
  Administered 2016-01-28: 1 mg via ORAL
  Filled 2016-01-28: qty 1

## 2016-01-28 MED ORDER — WARFARIN SODIUM 5 MG PO TABS
5.0000 mg | ORAL_TABLET | Freq: Once | ORAL | Status: AC
  Administered 2016-01-28: 5 mg via ORAL
  Filled 2016-01-28: qty 1

## 2016-01-28 MED ORDER — POTASSIUM CHLORIDE CRYS ER 20 MEQ PO TBCR
40.0000 meq | EXTENDED_RELEASE_TABLET | Freq: Once | ORAL | Status: AC
  Administered 2016-01-28: 40 meq via ORAL
  Filled 2016-01-28: qty 2

## 2016-01-28 NOTE — Progress Notes (Signed)
ANTICOAGULATION CONSULT NOTE - Follow Up Consult  Pharmacy Consult for Heparin/Coumadin Indication: thrombus  Allergies  Allergen Reactions  . Latex Swelling and Rash    Patient Measurements: Height: 5\' 7"  (170.2 cm) Weight: 155 lb 13.8 oz (70.7 kg) IBW/kg (Calculated) : 66.1 Heparin Dosing Weight: 72.5 kg  Vital Signs: Temp: 98.7 F (37.1 C) (04/03 0549) Temp Source: Oral (04/03 0549) BP: 100/65 mmHg (04/03 0549) Pulse Rate: 68 (04/03 0549)  Labs:  Recent Labs  01/26/16 0311 01/27/16 0702 01/28/16 0730  HGB 12.4* 11.9* 12.8*  HCT 37.2* 34.9* 37.8*  PLT 62* 62* 60*  LABPROT 18.4* 18.6* 17.9*  INR 1.52* 1.55* 1.47  HEPARINUNFRC 0.41 0.49 0.55  CREATININE 0.67 0.62 0.50*    Estimated Creatinine Clearance: 98.7 mL/min (by C-G formula based on Cr of 0.5).  Assessment:  Anticoag: hep/coum #3 rx: extensive thrombus from superior mesenteric vein ext to portal vein; Note h/o esoph varicies. Plts 60 chronic. Hgb 12.8. HL 0.55 in goal. INR 1.47 today. - Note baseline INR 1.52 (h/o Etoh, portal HTN)   Goal of Therapy:  Heparin level 0.3-0.7 units/ml  INR 2.3 Monitor platelets by anticoagulation protocol: Yes   Plan:  Continue heparin drip at 1600 units/hr Coumadin 5mg  po x 1 tonight. Daily HL/INR/CBC  Karstyn Birkey S. Merilynn Finlandobertson, PharmD, BCPS Clinical Staff Pharmacist Pager [phone]  Misty Stanleyobertson, Shelbee Apgar Stillinger 01/28/2016,8:48 AM

## 2016-01-28 NOTE — Progress Notes (Signed)
[place] TEAM 1 - Stepdown/ICU TEAM PROGRESS NOTE  Kenneth SpragueSamuel A Ehrich ZOX:[id number] DOB: 1961-05-26 DOA: 01/24/2016 PCP: No primary care provider on file.  Admit HPI / Brief Narrative: 55 year old male patient with known alcoholic cirrhosis with associated portal gastropathy, varices as well as chronic thrombocytopenia; also ongoing alcohol abuse and tobacco abuse, history of prior GI bleeding who presented to the ER with complaints of abdominal discomfort. Pprior to presenting to the ER he had a clear liquid red tinged BM without any clots. He did not have any rectal pain with passage of this bowel movement but continued to have increasing abdominal pain.   HPI/Subjective: The patient is doing well at the present time.  He denies abdominal pain shortness of breath nausea vomiting fevers or chills.  He and I had an extensive discussion concerning his liver disease during which I explained to him that he absolutely must discontinue alcohol abuse completely and entirely permanently.  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 Nonocclusive thrombus extending into the portal vein. -He was started on anticoagulation with IV heparin, started on Coumadin receive his first dose on 01/26/2016. -A.m. labs on 01/27/2016 showing INR of 1.55 with PT of 18.6. Pharmacy consulted for warfarin dosing. Meanwhile he remains unhappy heparin -This is a difficult situation for Mr. Graham having a history of advanced liver disease with esophageal varices and thrombocytopenia.  -We spoke about the risks and benefits of anticoagulation -On 01/28/2016 he remains subtherapeutic with INR 1.47, pharmacy consulted who is assisting with warfarin dosing. Thus  far he has not had evidence of bleeding complications while on IV heparin  Ascites Appreciable on exam but presently not troublesome - follow clinically with addition of diuretics - titrate lasix and aldactone upward today Filed Weights   01/26/16 0417 01/26/16 2119 01/27/16 0621  Weight: 71.578 kg (157 lb 12.8 oz) 71 kg (156 lb 8.4 oz) 70.7 kg (155 lb 13.8 oz)   Rectal bleed Appears to have been a self-limited issue - no recurrence despite anticoagulation - follow  Alcoholic cirrhosis of liver with ascites / Portal hypertension with esophageal varices / Thrombocytopenia  continues to drink 6-8 twelve ounce beers daily - counseled patient on absolute need to discontinue alcohol abuse permanently - he reported that he drinks to counter depression and that he has responded well to Celexa in the past - initiated Celexa - MELD ~10 at present - discussed w/ pt that he may avoid fulminant hepatic failure if he stops drinking completely, but that should he continue he will definitely progress to the point of dying from cirrhosis, and that he will not be a candidate for transplant if he is drinking - also advised avoidance of APAP as possible, and use only in very low doses if required   Mild hypokalemia Worsened by diuresis - cont to replace and follow   Umbilical hernia Currently not incarcerated on abdominal exam  Tobacco abuse Nicotine patch  Gynecomastia, male  Code Status: FULL Family Communication: no family present at time of exam Disposition Plan: Anticipate discharge home when medically stable  Consultants: [facility]   Procedures: none  Antibiotics: Zosyn 3/30 > 3/31 Rocephin 3/31 >  DVT prophylaxis: Fully anticoagulated  Objective: Blood pressure 100/65, pulse 68, temperature  98.7 F (37.1 C), temperature source Oral, resp. rate 17, height  (1.702 m), weight 70.7 kg (155 lb 13.8 oz), SpO2 98 %.  Intake/Output Summary (Last 24 hours) at 01/28/16  1546 Last data filed at 01/28/16 0550  Gross per 24 hour  Intake    360 ml  Output      0 ml  Net    360 ml   Exam: General: No acute respiratory distress, thin appearing, cachectic, chronically ill-appearing Lungs: Clear to auscultation bilaterally Cardiovascular: Regular rate and rhythm without murmur gallop or rub Abdomen: Nontender, distended w/ obvious ascites, soft, bowel sounds positive, no rebound Extremities: No significant cyanosis, clubbing, edema bilateral lower extremities  Data Reviewed:  Basic Metabolic Panel:  Recent Labs Lab 01/24/16 1016 01/24/16 1336 01/25/16 0257 01/26/16 0311 01/27/16 0702 01/28/16 0730  NA 136  --  135 137 138 139  K 3.5  --  3.5 3.2* 3.6 3.4*  CL 105  --  103 104 106 109  CO2 20*  --  21* GLUCOSE 111*  --  86 84 88 108*  BUN 5*  --  5* <5* <5* <5*  CREATININE 0.56*  --  0.58* 0.67 0.62 0.50*  CALCIUM 8.6*  --  8.4* 8.3* 8.6* 8.5*  MG  --  1.8  --   --  1.8  --     CBC:  Recent Labs Lab 01/24/16 1016 01/24/16 1628 01/24/16 2159 01/25/16 0257 01/26/16 0311 01/27/16 0702 01/28/16 0730  WBC 7.3 6.9 7.5 6.4 5.6 4.7 4.0  NEUTROABS 6.3 5.2  --   --   --   --   --   HGB 13.9 14.0 13.4 13.5 12.4* 11.9* 12.8*  HCT 40.9 41.4 39.6 40.0 37.2* 34.9* 37.8*  MCV 92.3 92.4 92.1 92.8 92.8 93.3 93.6  PLT 58* 55* 58* 58* 62* 62* 60*    Liver Function Tests:  Recent Labs Lab 01/24/16 1016 01/25/16 0257 01/26/16 0311 01/27/16 0702  AST 39 31 28 36  ALT 19 17 16* 20  ALKPHOS 125 107 89 88  BILITOT 2.0* 2.0* 1.3* 1.1  PROT 7.8 7.1 6.6 6.4*  ALBUMIN 3.8 3.3* 3.1* 3.0*    Recent Labs Lab 01/24/16 1336  LIPASE 39    Recent Labs Lab 01/24/16 1011  AMMONIA 47*    Coags:  Recent Labs Lab 01/24/16 1016 01/25/16 0257 01/26/16 0311 01/27/16 0702 01/28/16 0730  INR 1.52* 1.59* 1.52* 1.55* 1.47    Recent Labs Lab 01/25/16 0257  APTT 81*    Recent Results (from the past 240 hour(s))  Culture, blood  (Routine X 2) w Reflex to ID Panel     Status: None (Preliminary result)   Collection Time: 01/24/16  1:48 PM  Result Value Ref Range Status   Specimen Description BLOOD LEFT FOREARM  Final   Special Requests BOTTLES DRAWN AEROBIC ONLY 5CC  Final   Culture NO GROWTH 4 DAYS  Final   Report Status PENDING  Incomplete  Culture, blood (Routine X 2) w Reflex to ID Panel     Status: None (Preliminary result)   Collection Time: 01/24/16  1:53 PM  Result Value Ref Range Status   Specimen Description BLOOD LEFT HAND  Final   Special Requests BOTTLES DRAWN AEROBIC ONLY 5CC  Final   Culture NO GROWTH 4 DAYS  Final   Report Status PENDING  Incomplete  MRSA PCR Screening     Status: None   Collection Time: 01/24/16  7:17 PM  Result Value Ref Range Status   MRSA by PCR NEGATIVE NEGATIVE Final    Comment:        The GeneXpert MRSA Assay (FDA approved for NASAL specimens only), is one component of a comprehensive MRSA colonization surveillance program. It is not intended to diagnose MRSA infection nor to guide or monitor treatment for MRSA infections.      Studies:   Recent x-ray studies have been reviewed in detail by the Attending Physician  Scheduled Meds:  Scheduled Meds: . cefTRIAXone (ROCEPHIN)  IV  1 g Intravenous Q24H  . citalopram  20 mg Oral Daily  . folic acid  1 mg Oral Daily  . furosemide  40 mg Oral BID  . nicotine  14 mg Transdermal Daily  . propranolol  5 mg Oral TID  . spironolactone  25 mg Oral Daily  . thiamine  100 mg Oral Daily  . warfarin  5 mg Oral ONCE-1800  . Warfarin - Pharmacist Dosing Inpatient   Does not apply q1800    Time spent on care of this patient: 15 mins   Jeralyn Bennett , MD   [facility] Office  [phone] Pager - Text Page per Amion as per below:  On-Call/Text Page:      [email or web address]      password [email or web address]  If 7PM-7AM, please contact night-coverage [email or web address] Password [email or web address] 01/28/2016, 3:46 PM   LOS: 4 days

## 2016-01-29 DIAGNOSIS — K7030 Alcoholic cirrhosis of liver without ascites: Secondary | ICD-10-CM

## 2016-01-29 LAB — CBC
HCT: 40.8 % (ref 39.0–52.0)
Hemoglobin: 13.8 g/dL (ref 13.0–17.0)
MCH: 31.3 pg (ref 26.0–34.0)
MCHC: 33.8 g/dL (ref 30.0–36.0)
MCV: 92.5 fL (ref 78.0–100.0)
PLATELETS: 65 10*3/uL — AB (ref 150–400)
RBC: 4.41 MIL/uL (ref 4.22–5.81)
RDW: 14.4 % (ref 11.5–15.5)
WBC: 5.5 10*3/uL (ref 4.0–10.5)

## 2016-01-29 LAB — CULTURE, BLOOD (ROUTINE X 2)
CULTURE: NO GROWTH
Culture: NO GROWTH

## 2016-01-29 LAB — HEPARIN LEVEL (UNFRACTIONATED): Heparin Unfractionated: 0.61 IU/mL (ref 0.30–0.70)

## 2016-01-29 LAB — PROTIME-INR
INR: 1.51 — ABNORMAL HIGH (ref 0.00–1.49)
PROTHROMBIN TIME: 18.3 s — AB (ref 11.6–15.2)

## 2016-01-29 MED ORDER — CLONAZEPAM 0.5 MG PO TABS: 0.5000 mg | ORAL_TABLET | Freq: Every evening | ORAL | Status: DC | PRN

## 2016-01-29 MED ORDER — ENOXAPARIN SODIUM 100 MG/ML ~~LOC~~ SOLN
100.0000 mg | SUBCUTANEOUS | Status: DC
Start: 2016-01-29 — End: 2016-01-29
  Administered 2016-01-29: 100 mg via SUBCUTANEOUS
  Filled 2016-01-29: qty 1

## 2016-01-29 MED ORDER — LORAZEPAM 0.5 MG PO TABS: 0.5000 mg | ORAL_TABLET | Freq: Every evening | ORAL | Status: DC | PRN

## 2016-01-29 MED ORDER — ENOXAPARIN (LOVENOX) PATIENT EDUCATION KIT
PACK | Freq: Once | Status: DC
  Filled 2016-01-29: qty 1

## 2016-01-29 MED ORDER — WARFARIN SODIUM 5 MG PO TABS
5.0000 mg | ORAL_TABLET | Freq: Once | ORAL | Status: DC
  Filled 2016-01-29: qty 1

## 2016-01-29 MED ORDER — SPIRONOLACTONE 25 MG PO TABS: 25.0000 mg | ORAL_TABLET | Freq: Every day | ORAL | Status: DC

## 2016-01-29 MED ORDER — FUROSEMIDE 40 MG PO TABS
40.0000 mg | ORAL_TABLET | Freq: Two times a day (BID) | ORAL | Status: DC
Start: 2016-01-29 — End: 2016-03-06

## 2016-01-29 MED ORDER — CITALOPRAM HYDROBROMIDE 20 MG PO TABS: 20.0000 mg | ORAL_TABLET | Freq: Every day | ORAL | Status: DC

## 2016-01-29 MED ORDER — PROPRANOLOL HCL 10 MG PO TABS: 5.0000 mg | ORAL_TABLET | Freq: Three times a day (TID) | ORAL | Status: DC

## 2016-01-29 MED ORDER — ENOXAPARIN SODIUM 100 MG/ML ~~LOC~~ SOLN: 100.0000 mg | SUBCUTANEOUS | Status: DC

## 2016-01-29 MED ORDER — WARFARIN SODIUM 5 MG PO TABS: 5.0000 mg | ORAL_TABLET | Freq: Once | ORAL | Status: DC

## 2016-01-29 NOTE — Care Management Note (Signed)
Case Management Note  Patient Details  Name: Kenneth [name] MRN: [id number] Date of Birth: 1960/11/21  Subjective/Objective:                    Action/Plan:  Patient discharging on Lovenox 100 mg SQ daily x 5 doses and Coumadin.   Confirmed with patient no insurance and no PCP.  Confirmed with Nita with pharmacy MATCH will cover Lovenox . MATCH letter given to patient.  [facility] January [facility], 2016 at 1030 for MD follow up and PT/INR check.   Patti RN will teach patient to given SQ injection before discharge.  Patient aware of above and voices understanding . Patient plans to use [facility] on [address] , called same they have Lovenox in stock. Patient aware. Expected Discharge Date:                  Expected Discharge Plan:  Home/Self Care  In-House Referral:     Discharge planning Services  CM Consult, [facility], Medication Assistance  Post Acute Care Choice:    Choice offered to:  Patient  DME Arranged:    DME Agency:     HH Arranged:    HH Agency:     Status of Service:  Completed, signed off  Medicare Important Message Given:    Date Medicare IM Given:    Medicare IM give by:    Date Additional Medicare IM Given:    Additional Medicare Important Message give by:     If discussed at Long Length of Stay Meetings, dates discussed:    Additional Comments:  Kingsley PlanWile, Jazier Mcglamery Marie, RN 01/29/2016, 2:57 PM

## 2016-01-29 NOTE — Care Management Note (Addendum)
Case Management Note  Patient Details  Name: Meriam SpragueSamuel A Demarco MRN: [id number] Date of Birth: [birth year]  Subjective/Objective:                    Action/Plan: Will need to know how much Lovenox patient will need at discharge to see if medication assistance letter will cover cost . [facility] . Will set patient up for follow up [facility] ( [facility] ) once it is known when PT/INR needed. Ronny FlurryHeather Staphany Ditton RN BSN [phone]   Expected Discharge Date:                  Expected Discharge Plan:  Home/Self Care  In-House Referral:     Discharge planning Services     Post Acute Care Choice:    Choice offered to:     DME Arranged:    DME Agency:     HH Arranged:    HH Agency:     Status of Service:  In process, will continue to follow  Medicare Important Message Given:    Date Medicare IM Given:    Medicare IM give by:    Date Additional Medicare IM Given:    Additional Medicare Important Message give by:     If discussed at Long Length of Stay Meetings, dates discussed:  01-29-16 Ur updated   Additional Comments:  Kingsley PlanWile, Weber Monnier Marie, RN 01/29/2016, 11:05 AM

## 2016-01-29 NOTE — Discharge Summary (Signed)
Physician Discharge Summary  Kenneth [name]:[id number] DOB: July 22, 1961 DOA: 01/24/2016  PCP: No primary care provider on file.  Admit date: 01/24/2016 Discharge date: 01/29/2016  Time spent: 35 minutes  Recommendations for Outpatient Follow-up:  1. Please follow-up on a PT/INR, he was discharged on Coumadin with subcutaneous Lovenox bridge for treatment of SMA thrombosis. He has history of alcoholic liver disease, thrombocytopenia, esophageal varices and anticoagulation will need to be closely monitored. He was instructed to have PT/INR checked on 01/31/2016. 2. On 01/29/2016 INR subtherapeutic at 1.5. 3. Recommend checking CBC and BMP on hospital follow-up visit, on day of discharge had hemoglobin of 13.8 with hematocrit of 40.8. He was discharged on Lasix and spironolactone and will need to monitor kidney function and electrolytes.   Discharge Diagnoses:  Principal Problem:   SBP (spontaneous bacterial peritonitis) (HCC) Active Problems:   Portal hypertension with esophageal varices (HCC)   Thrombocytopenia (HCC)   Alcoholic cirrhosis of liver without ascites (HCC)   Rectal bleed   Umbilical hernia   Ascites   Tobacco abuse   Gynecomastia, male   Small bowel ischemia (HCC)   Discharge Condition: Stable  Diet recommendation: Low sodium diet  Filed Weights   01/26/16 2119 01/27/16 0621 01/29/16 0733  Weight: 71 kg (156 lb 8.4 oz) 70.7 kg (155 lb 13.8 oz) 70.6 kg (155 lb 10.3 oz)    History of present illness:  55 year old male patient with known alcoholic cirrhosis with associated portal gastropathy, varices as well as chronic thrombocytopenia; also ongoing alcohol abuse and tobacco abuse, history of prior GI bleeding. Patient presents to the ER with initial complaints of abdominal discomfort with acute onset overnight between 3 and 4 AM that awakened him from sleep. He has not had any fevers or chills. For about 3 weeks he has noticed mushy nonbloody stools i.e. nonsolid stools.  This morning prior to presenting to the ER he had a clear liquid redtinged BM without any clots. He did not have any rectal pain with passage of this bowel movement but continued to have increasing abdominal pain. He has not had a paracentesis since 2010, he has been around a family members with recent influenza but has not been around anyone with enteritis-type symptoms. He admits to continued alcohol and tobacco abuse. States he has not been to the doctor in several years. He previously has been prescribed furosemide as well as beta blockers but when the prescriptions ran out he did not follow-up to obtain refills.  Hospital Course:  [name] is a 55 year old woman with alcoholic liver disease, portal gastropathy, thrombocytopenia, esophageal varices admitted to the medicine service on 01/24/2016 when he presented with complaints of abdominal pain. Workup included a CT scan of abdomen and pelvis with IV contrast performed on 01/24/2016 that showed thrombosis of the superior mesenteric vein with findings compatible with small bowel ischemia of the right abdomen. He also had nonocclusive thrombus extending into the portal vein. He was started on anticoagulation with IV heparin. Pharmacy was consulted for Coumadin dosing. Overall this reflects a difficult situation for Mr. Hegner having history of advanced liver disease with esophageal varices and thrombocytopenia having increased risk of bleed. The risks and benefits of anticoagulation were discussed with Mr. Kerkhoff. On 01/29/2016 he remained stable, labs showing stable hemoglobin of 13.8, without clinical evidence of bleeding complications. INR remains subtherapeutic at 1.5. Case discussed with pharmacy, will be discharged on Lovenox 100 mg subcutaneous every daily 5 days with warfarin 5 mg by mouth daily. He  was struck to follow-up with the wellness clinic on 01/31/2016 for a PT/INR check. Prior to discharge he was given Lovenox teaching. He was discharged to  his home in stable condition on 01/29/2016.  Consultations:  Pharmacy  Discharge Exam: Filed Vitals:   01/29/16 0733 01/29/16 1419  BP: 94/59 97/58  Pulse: 71 66  Temp: 98.2 F (36.8 C) 98.7 F (37.1 C)  Resp: 18 16   General: No acute respiratory distress, thin appearing, cachectic, chronically ill-appearing Lungs: Clear to auscultation bilaterally Cardiovascular: Regular rate and rhythm without murmur gallop or rub Abdomen: Nontender, distended w/ obvious ascites, soft, bowel sounds positive, no rebound Extremities: No significant cyanosis, clubbing, edema bilateral lower extremities   Discharge Instructions   Discharge Instructions    Call MD for:  difficulty breathing, headache or visual disturbances    Complete by:  As directed      Call MD for:  extreme fatigue    Complete by:  As directed      Call MD for:  hives    Complete by:  As directed      Call MD for:  persistant dizziness or light-headedness    Complete by:  As directed      Call MD for:  persistant nausea and vomiting    Complete by:  As directed      Call MD for:  redness, tenderness, or signs of infection (pain, swelling, redness, odor or green/yellow discharge around incision site)    Complete by:  As directed      Call MD for:  severe uncontrolled pain    Complete by:  As directed      Call MD for:  temperature >100.4    Complete by:  As directed      Call MD for:    Complete by:  As directed      Diet - low sodium heart healthy    Complete by:  As directed      Increase activity slowly    Complete by:  As directed           Current Discharge Medication List    START taking these medications   Details  citalopram (CELEXA) 20 MG tablet Take 1 tablet (20 mg total) by mouth daily. Qty: 30 tablet, Refills: 1    clonazePAM (KLONOPIN) 0.5 MG tablet Take 1 tablet (0.5 mg total) by mouth at bedtime as needed for anxiety. Qty: 12 tablet, Refills: 0    enoxaparin (LOVENOX) 100 MG/ML injection  Inject 1 mL (100 mg total) into the skin daily. Qty: 4 Syringe, Refills: 0    furosemide (LASIX) 40 MG tablet Take 1 tablet (40 mg total) by mouth 2 (two) times daily. Qty: 60 tablet, Refills: 1    propranolol (INDERAL) 10 MG tablet Take 0.5 tablets (5 mg total) by mouth 3 (three) times daily. Qty: 90 tablet, Refills: 1    spironolactone (ALDACTONE) 25 MG tablet Take 1 tablet (25 mg total) by mouth daily. Qty: 30 tablet, Refills: 1    warfarin (COUMADIN) 5 MG tablet Take 1 tablet (5 mg total) by mouth one time only at 6 PM. Qty: 30 tablet, Refills: 1      CONTINUE these medications which have NOT CHANGED   Details  Multiple Vitamin (MULTIVITAMIN WITH MINERALS) TABS tablet Take 0.5 tablets by mouth daily.       Allergies  Allergen Reactions  . Latex Swelling and Rash   Follow-up Information    Follow up with [facility].   Why:  Thursday January 31, 2016 at 1030   Contact information:   [address] [place] [id number] [phone]       The results of significant diagnostics from this hospitalization (including imaging, microbiology, ancillary and laboratory) are listed below for reference.    Significant Diagnostic Studies: Dg Chest 2 View  01/24/2016  CLINICAL DATA:  55 year old male with abdominal pain and distention for 4 days with nausea vomiting diarrhea. Intermittent cough. Initial encounter. Smoker. EXAM: CHEST  2 VIEW COMPARISON:  Abdominal series [id number] 08/31/2014 and earlier FINDINGS: Upper limits of normal to mildly hyperinflated lungs. Normal cardiac size and mediastinal contours. Visualized tracheal air column is within normal limits. No pneumothorax or pneumoperitoneum. No pulmonary edema, pleural effusion or confluent pulmonary opacity. Mildly increased interstitial markings bilaterally. No acute osseous abnormality identified. Calcified aortic atherosclerosis. IMPRESSION: Mild hyperinflation. Bilateral  increased interstitial opacity could reflect viral/atypical respiratory infection, or be chronic. No other acute cardiopulmonary abnormality. Electronically Signed   By: Odessa FlemingH  Hall M.D.   On: 01/24/2016 10:12   Ct Abdomen Pelvis W Contrast  01/24/2016  CLINICAL DATA:  Right upper quadrant tenderness with nausea and vomiting for 2-3 days. Abdominal distention and tightness. History of cirrhosis. Initial encounter. EXAM: CT ABDOMEN AND PELVIS WITH CONTRAST TECHNIQUE: Multidetector CT imaging of the abdomen and pelvis was performed using the standard protocol following bolus administration of intravenous contrast. CONTRAST:  100 ml ISOVUE-300 IOPAMIDOL (ISOVUE-300) INJECTION 61% COMPARISON:  None. FINDINGS: Dependent atelectasis is seen in the lung bases. No pleural or pericardial effusion. The liver is shrunken with a nodular border consistent with cirrhosis. No focal liver lesion is identified. A moderate volume of ascites is seen. The spleen is at the upper limits of normal in size at 14 cm craniocaudal. There is recanalization of the umbilical vein. The superior mesenteric vein is nearly completely occluded with thrombus. Nonocclusive thrombus is seen extending into the portal vein. Wall thickening of small bowel loops in the right mid abdomen is worrisome for venous congestion and ischemia. No pneumatosis, portal venous gas or free intraperitoneal air is identified. The small bowel is otherwise unremarkable. A few small stones are seen within the gallbladder. The adrenal glands and pancreas are unremarkable. The left kidney appears normal. A few small low attenuating lesions in the right kidney are likely cysts. The stomach and colon appear normal. The appendix is not visualized but no evidence of appendicitis is seen. There are some small retroperitoneal lymph nodes but no pathologic lymphadenopathy by CT size criteria is identified. No focal bony lesion is seen. Degenerative disc disease L5-S1 noted. IMPRESSION:  Thrombosis of the superior mesenteric vein with findings compatible with small bowel ischemia in the right abdomen. Nonocclusive thrombus extends into the portal vein. Cirrhotic liver with a moderate volume of abdominal ascites. Gallstones without evidence of cholecystitis. Critical Value/emergent results were called by telephone at the time of interpretation on 01/24/2016 at 4:14 pm to Junious SilkALLISON ELLIS, N.P., who verbally acknowledged these results. Electronically Signed   By: Drusilla Kannerhomas  Dalessio M.D.   On: 01/24/2016 16:16    Microbiology: Recent Results (from the past 240 hour(s))  Culture, blood (Routine X 2) w Reflex to ID Panel     Status: None   Collection Time: 01/24/16  1:48 PM  Result Value Ref Range Status   Specimen Description BLOOD LEFT FOREARM  Final   Special Requests BOTTLES DRAWN AEROBIC ONLY 5CC  Final   Culture NO  GROWTH 5 DAYS  Final   Report Status 01/29/2016 FINAL  Final  Culture, blood (Routine X 2) w Reflex to ID Panel     Status: None   Collection Time: 01/24/16  1:53 PM  Result Value Ref Range Status   Specimen Description BLOOD LEFT HAND  Final   Special Requests BOTTLES DRAWN AEROBIC ONLY 5CC  Final   Culture NO GROWTH 5 DAYS  Final   Report Status 01/29/2016 FINAL  Final  MRSA PCR Screening     Status: None   Collection Time: 01/24/16  7:17 PM  Result Value Ref Range Status   MRSA by PCR NEGATIVE NEGATIVE Final    Comment:        The GeneXpert MRSA Assay (FDA approved for NASAL specimens only), is one component of a comprehensive MRSA colonization surveillance program. It is not intended to diagnose MRSA infection nor to guide or monitor treatment for MRSA infections.      Labs: Basic Metabolic Panel:  Recent Labs Lab 01/24/16 1016 01/24/16 1336 01/25/16 0257 01/26/16 0311 01/27/16 0702 01/28/16 0730  NA 136  --  135 137 138 139  K 3.5  --  3.5 3.2* 3.6 3.4*  CL 105  --  103 104 106 109  CO2 20*  --  21* 25 22 22   GLUCOSE 111*  --  86 84 88  108*  BUN 5*  --  5* <5* <5* <5*  CREATININE 0.56*  --  0.58* 0.67 0.62 0.50*  CALCIUM 8.6*  --  8.4* 8.3* 8.6* 8.5*  MG  --  1.8  --   --  1.8  --    Liver Function Tests:  Recent Labs Lab 01/24/16 1016 01/25/16 0257 01/26/16 0311 01/27/16 0702  AST 39 31 28 36  ALT 19 17 16* 20  ALKPHOS 125 107 89 88  BILITOT 2.0* 2.0* 1.3* 1.1  PROT 7.8 7.1 6.6 6.4*  ALBUMIN 3.8 3.3* 3.1* 3.0*    Recent Labs Lab 01/24/16 1336  LIPASE 39    Recent Labs Lab 01/24/16 1011  AMMONIA 47*   CBC:  Recent Labs Lab 01/24/16 1016 01/24/16 1628  01/25/16 0257 01/26/16 0311 01/27/16 0702 01/28/16 0730 01/29/16 0418  WBC 7.3 6.9  < > 6.4 5.6 4.7 4.0 5.5  NEUTROABS 6.3 5.2  --   --   --   --   --   --   HGB 13.9 14.0  < > 13.5 12.4* 11.9* 12.8* 13.8  HCT 40.9 41.4  < > 40.0 37.2* 34.9* 37.8* 40.8  MCV 92.3 92.4  < > 92.8 92.8 93.3 93.6 92.5  PLT 58* 55*  < > 58* 62* 62* 60* 65*  < > = values in this interval not displayed. Cardiac Enzymes: No results for input(s): CKTOTAL, CKMB, CKMBINDEX, TROPONINI in the last 168 hours. BNP: BNP (last 3 results) No results for input(s): BNP in the last 8760 hours.  ProBNP (last 3 results) No results for input(s): PROBNP in the last 8760 hours.  CBG: No results for input(s): GLUCAP in the last 168 hours.     Signed:  Jeralyn Bennett MD.  [facility] 01/29/2016, 2:49 PM

## 2016-01-29 NOTE — Progress Notes (Signed)
ANTICOAGULATION CONSULT NOTE - Follow Up Consult  Pharmacy Consult for Heparin/Coumadin Indication: thrombus  Allergies  Allergen Reactions  . Latex Swelling and Rash    Patient Measurements: Height: 5\' 7"  (170.2 cm) Weight: 155 lb 10.3 oz (70.6 kg) IBW/kg (Calculated) : 66.1 Heparin Dosing Weight: 72.5 kg  Vital Signs: Temp: 98.2 F (36.8 C) (04/04 0733) Temp Source: Oral (04/04 0733) BP: 94/59 mmHg (04/04 0733) Pulse Rate: 71 (04/04 0733)  Labs:  Recent Labs  01/27/16 0702 01/28/16 0730 01/29/16 0418  HGB 11.9* 12.8* 13.8  HCT 34.9* 37.8* 40.8  PLT 62* 60* 65*  LABPROT 18.6* 17.9* 18.3*  INR 1.55* 1.47 1.51*  HEPARINUNFRC 0.49 0.55 0.61  CREATININE 0.62 0.50*  --     Estimated Creatinine Clearance: 98.7 mL/min (by C-G formula based on Cr of 0.5).  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 Plts 65 chronic. Hgb 13.8. HL 0.61 in goal. INR 1.51 today. RN reports no s/s of bleeding - Note baseline INR 1.52 (h/o Etoh, portal HTN)   Goal of Therapy:  Heparin level 0.3-0.7 units/ml  INR 2-3 Monitor platelets by anticoagulation protocol: Yes   Plan:  Continue heparin drip at 1600 units/hr Coumadin 5mg  po x 1 tonight. Daily HL/INR/CBC  Vinnie LevelBenjamin Kennady Zimmerle, PharmD., BCPS Clinical Pharmacist Pager [phone]

## 2016-01-31 ENCOUNTER — Ambulatory Visit: Payer: Self-pay | Attending: Family Medicine | Admitting: Family Medicine

## 2016-01-31 ENCOUNTER — Encounter: Payer: Self-pay | Admitting: Family Medicine

## 2016-01-31 VITALS — BP 100/64 | HR 55 | Temp 98.5°F | Resp 15 | Ht 67.0 in

## 2016-01-31 DIAGNOSIS — K766 Portal hypertension: Secondary | ICD-10-CM

## 2016-01-31 DIAGNOSIS — Z7901 Long term (current) use of anticoagulants: Secondary | ICD-10-CM

## 2016-01-31 DIAGNOSIS — I81 Portal vein thrombosis: Secondary | ICD-10-CM

## 2016-01-31 DIAGNOSIS — Z5181 Encounter for therapeutic drug level monitoring: Secondary | ICD-10-CM

## 2016-01-31 DIAGNOSIS — I8500 Esophageal varices without bleeding: Secondary | ICD-10-CM

## 2016-01-31 DIAGNOSIS — F17210 Nicotine dependence, cigarettes, uncomplicated: Secondary | ICD-10-CM | POA: Insufficient documentation

## 2016-01-31 DIAGNOSIS — K7030 Alcoholic cirrhosis of liver without ascites: Secondary | ICD-10-CM | POA: Insufficient documentation

## 2016-01-31 DIAGNOSIS — K55069 Acute infarction of intestine, part and extent unspecified: Secondary | ICD-10-CM | POA: Insufficient documentation

## 2016-01-31 DIAGNOSIS — Z79899 Other long term (current) drug therapy: Secondary | ICD-10-CM | POA: Insufficient documentation

## 2016-01-31 DIAGNOSIS — IMO0002 Reserved for concepts with insufficient information to code with codable children: Secondary | ICD-10-CM

## 2016-01-31 DIAGNOSIS — D696 Thrombocytopenia, unspecified: Secondary | ICD-10-CM | POA: Insufficient documentation

## 2016-01-31 LAB — CBC WITH DIFFERENTIAL/PLATELET
BASOS ABS: 61 {cells}/uL (ref 0–200)
Basophils Relative: 1 %
EOS PCT: 4 %
Eosinophils Absolute: 244 cells/uL (ref 15–500)
HCT: 42.6 % (ref 38.5–50.0)
Hemoglobin: 14.8 g/dL (ref 13.2–17.1)
Lymphocytes Relative: 11 %
Lymphs Abs: 671 cells/uL — ABNORMAL LOW (ref 850–3900)
MCH: 32.2 pg (ref 27.0–33.0)
MCHC: 34.7 g/dL (ref 32.0–36.0)
MCV: 92.6 fL (ref 80.0–100.0)
MONOS PCT: 21 %
MPV: 12.2 fL (ref 7.5–12.5)
Monocytes Absolute: 1281 cells/uL — ABNORMAL HIGH (ref 200–950)
NEUTROS PCT: 63 %
Neutro Abs: 3843 cells/uL (ref 1500–7800)
PLATELETS: 104 10*3/uL — AB (ref 140–400)
RBC: 4.6 MIL/uL (ref 4.20–5.80)
RDW: 14.8 % (ref 11.0–15.0)
WBC: 6.1 10*3/uL (ref 3.8–10.8)

## 2016-01-31 NOTE — Progress Notes (Signed)
Hospital follow up for clot Takes 5 mg coumadin and lovonox daily

## 2016-01-31 NOTE — Patient Instructions (Signed)
Cirrhosis °Cirrhosis is long-term (chronic) liver injury. The liver is your largest internal organ, and it performs many functions. The liver converts food into energy, removes toxic material from your blood, makes important proteins, and absorbs necessary vitamins from your diet. °If you have cirrhosis, it means many of your healthy liver cells have been replaced by scar tissue. This prevents blood from flowing through your liver, which makes it difficult for your liver to function. This scarring is not reversible, but treatment can prevent it from getting worse.  °CAUSES  °Hepatitis C and long-term alcohol abuse are the most common causes of cirrhosis. Other causes include: °· Nonalcoholic fatty liver disease. °· Hepatitis B infection. °· Autoimmune hepatitis. °· Diseases that cause blockage of ducts inside the liver. °· Inherited liver diseases. °· Reactions to certain long-term medicines. °· Parasitic infections. °· Long-term exposure to certain toxins. °RISK FACTORS °You may have a higher risk of cirrhosis if you: °· Have certain hepatitis viruses. °· Abuse alcohol, especially if you are male. °· Are overweight. °· Share needles. °· Have unprotected sex with someone who has hepatitis. °SYMPTOMS  °You may not have any signs and symptoms at first. Symptoms may not develop until the damage to your liver starts to get worse. Signs and symptoms of cirrhosis may include:  °· Tenderness in the right-upper part of your abdomen. °· Weakness and tiredness (fatigue). °· Loss of appetite. °· Nausea. °· Weight loss and muscle loss. °· Itchiness. °· Yellow skin and eyes (jaundice). °· Buildup of fluid in the abdomen (ascites). °· Swelling of the feet and ankles (edema). °· Appearance of tiny blood vessels under the skin. °· Mental confusion. °· Easy bruising and bleeding. °DIAGNOSIS  °Your health care provider may suspect cirrhosis based on your symptoms and medical history, especially if you have other medical conditions  or a history of alcohol abuse. Your health care provider will do a physical exam to feel your liver and check for signs of cirrhosis. Your health care provider may perform other tests, including:  °· Blood tests to check:   °¨ Whether you have hepatitis B or C.   °¨ Kidney function. °¨ Liver function. °· Imaging tests such as: °¨ MRI or CT scan to look for changes seen in advanced cirrhosis. °¨ Ultrasound to see if normal liver tissue is being replaced by scar tissue. °· A procedure using a long needle to take a sample of liver tissue (biopsy) for examination under a microscope. Liver biopsy can confirm the diagnosis of cirrhosis.   °TREATMENT  °Treatment depends on how damaged your liver is and what caused the damage. Treatment may include treating cirrhosis symptoms or treating the underlying causes of the condition to try to slow the progression of the damage. Treatment may include: °· Making lifestyle changes, such as:   °¨ Eating a healthy diet. °¨ Restricting salt intake.  °¨ Maintaining a healthy weight.   °¨ Not abusing drugs or alcohol. °· Taking medicines to: °¨ Treat liver infections or other infections. °¨ Control itching. °¨ Reduce fluid buildup. °¨ Reduce certain blood toxins. °¨ Reduce risk of bleeding from enlarged blood vessels in the stomach or esophagus (varices). °· If varices are causing bleeding problems, you may need treatment with a procedure that ties up the vessels causing them to fall off (band ligation). °· If cirrhosis is causing your liver to fail, your health care provider may recommend a liver transplant. °· Other treatments may be recommended depending on any complications of cirrhosis, such as liver-related kidney failure (hepatorenal   syndrome). °HOME CARE INSTRUCTIONS  °· Take medicines only as directed by your health care provider. Do not use drugs that are toxic to your liver. Ask your health care provider before taking any new medicines, including over-the-counter medicines.    °· Rest as needed. °· Eat a well-balanced diet. Ask your health care provider or dietitian for more information.   °· You may have to follow a low-salt diet or restrict your water intake as directed. °· Do not drink alcohol. This is especially important if you are taking acetaminophen. °· Keep all follow-up visits as directed by your health care provider. This is important. °SEEK MEDICAL CARE IF: °· You have fatigue or weakness that is getting worse. °· You develop swelling of the hands, feet, legs, or face. °· You have a fever. °· You develop loss of appetite. °· You have nausea or vomiting. °· You develop jaundice. °· You develop easy bruising or bleeding. °SEEK IMMEDIATE MEDICAL CARE IF: °· You vomit bright red blood or a material that looks like coffee grounds. °· You have blood in your stools. °· Your stools appear black and tarry. °· You become confused. °· You have chest pain or trouble breathing. °  °This information is not intended to replace advice given to you by your health care provider. Make sure you discuss any questions you have with your health care provider. °  °Document Released: 10/13/2005 Document Revised: 11/03/2014 Document Reviewed: 06/21/2014 °Elsevier Interactive Patient Education ©2016 [employer] ° °

## 2016-01-31 NOTE — Progress Notes (Signed)
CC: Follow-up from hospitalization  HPI: Kenneth [name] is a 55 y.o. male with history of alcohol abuse, alcoholic liver cirrhosis, thrombocytopenia who was recently hospitalized at [facility] from 01/24/16 through 01/29/16 after he had presented with abdominal pain and hematochezia.   Workup included a CT scan of abdomen and pelvis with IV contrast performed on 01/24/2016 that showed thrombosis of the superior mesenteric vein with findings compatible with small bowel ischemia of the right abdomen. He also had nonocclusive thrombus extending into the portal vein. He was started on anticoagulation with IV heparin which was later switched to Coumadin and Lovenox prior to discharge. Hemoglobin was stable at 13.8 but he did have thrombocytopenia with a discharge platelet of 65, INR was 1.5.  He is currently on Lovenox and Coumadin and denies any bleeding. Patient has No headache, No chest pain, No abdominal pain - No Nausea, No new weakness tingling or numbness, No Cough - SOB.  Allergies  Allergen Reactions  . Latex Swelling and Rash   Past Medical History  Diagnosis Date  . Alcohol abuse   . Polysubstance abuse   . H/O [facility] spotted fever     age 39 or 23  . Upper GI bleed 08/31/2014  . Esophageal varices (HCC) 09/02/2014  . Portal hypertension with esophageal varices (HCC) 09/02/2014  . Portal hypertensive gastropathy 09/02/2014  . Hiatal hernia 09/02/2014  . Thrombocytopenia (HCC) 09/02/2014   Current Outpatient Prescriptions on File Prior to Visit  Medication Sig Dispense Refill  . citalopram (CELEXA) 20 MG tablet Take 1 tablet (20 mg total) by mouth daily. 30 tablet 1  . clonazePAM (KLONOPIN) 0.5 MG tablet Take 1 tablet (0.5 mg total) by mouth at bedtime as needed for anxiety. 12 tablet 0  . enoxaparin (LOVENOX) 100 MG/ML injection Inject 1 mL (100 mg total) into the skin daily. 4 Syringe 0  . furosemide (LASIX) 40 MG tablet Take 1 tablet (40 mg total) by mouth 2 (two) times  daily. 60 tablet 1  . Multiple Vitamin (MULTIVITAMIN WITH MINERALS) TABS tablet Take 0.5 tablets by mouth daily.    . propranolol (INDERAL) 10 MG tablet Take 0.5 tablets (5 mg total) by mouth 3 (three) times daily. 90 tablet 1  . spironolactone (ALDACTONE) 25 MG tablet Take 1 tablet (25 mg total) by mouth daily. 30 tablet 1  . warfarin (COUMADIN) 5 MG tablet Take 1 tablet (5 mg total) by mouth one time only at 6 PM. 30 tablet 1   No current facility-administered medications on file prior to visit.   No family history on file. Social History   Social History  . Marital Status: Single    Spouse Name: N/A  . Number of Children: N/A  . Years of Education: N/A   Occupational History  . Not on file.   Social History Main Topics  . Smoking status: Current Every Day Smoker -- 1.00 packs/day for 2 years    Types: Cigarettes  . Smokeless tobacco: Never Used  . Alcohol Use: Yes     Comment: 7 beers per day  . Drug Use: Yes    Special: Marijuana     Comment: occasional marijuana  . Sexual Activity: Not on file   Other Topics Concern  . Not on file   Social History Narrative    Review of Systems: Constitutional: Negative for fever, chills, diaphoresis, activity change, appetite change and fatigue. HENT: Negative for ear pain, nosebleeds, congestion, facial swelling, rhinorrhea, neck pain, neck stiffness and ear  discharge.  Eyes: Negative for pain, discharge, redness, itching and visual disturbance. Respiratory: Negative for cough, choking, chest tightness, shortness of breath, wheezing and stridor.  Cardiovascular: Negative for chest pain, palpitations and leg swelling. Gastrointestinal: Negative for abdominal distention. Genitourinary: Negative for dysuria, urgency, frequency, hematuria, flank pain, decreased urine volume, difficulty urinating and dyspareunia.  Musculoskeletal: Negative for back pain, joint swelling, arthralgias and gait problem. Neurological: Negative for  dizziness, tremors, seizures, syncope, facial asymmetry, speech difficulty, weakness, light-headedness, numbness and headaches.  Hematological: Negative for adenopathy. Does not bruise/bleed easily. Psychiatric/Behavioral: Negative for hallucinations, behavioral problems, confusion, dysphoric mood, decreased concentration and agitation.    Objective: Filed Vitals:   01/31/16 1100  BP: 100/64  Pulse: 55  Temp: 98.5 F (36.9 C)  Resp: 15  Height: 5\' 7"  (1.702 m)  SpO2: 97%      Physical Exam: Constitutional: Patient appears well-developed and well-nourished. No distress. HENT: Normocephalic, atraumatic, External right and left ear normal. Oropharynx is clear and moist.  Eyes: Conjunctivae and EOM are normal. PERRLA, no scleral icterus. Neck: Normal ROM. Neck supple. No JVD. No tracheal deviation. No thyromegaly. CVS: RRR, S1/S2 +, no murmurs, no gallops, no carotid bruit.  Pulmonary: Effort and breath sounds normal, no stridor, rhonchi, wheezes, rales.  Abdominal: Soft. BS +,  no distension, tenderness, rebound or guarding.  Musculoskeletal: Normal range of motion. No edema and no tenderness.  Lymphadenopathy: No lymphadenopathy noted, cervical, inguinal or axillary Neuro: Alert. Normal reflexes, muscle tone coordination. No cranial nerve deficit. Skin: Skin is warm and dry. No rash noted. Not diaphoretic. No erythema. No pallor. Psychiatric: Normal mood and affect. Behavior, judgment, thought content normal.  Lab Results  Component Value Date   WBC 5.5 01/29/2016   HGB 13.8 01/29/2016   HCT 40.8 01/29/2016   MCV 92.5 01/29/2016   PLT 65* 01/29/2016   Lab Results  Component Value Date   CREATININE 0.50* 01/28/2016   BUN <5* 01/28/2016   NA 139 01/28/2016   K 3.4* 01/28/2016   CL 109 01/28/2016   CO2 22 01/28/2016    No results found for: HGBA1C Lipid Panel  No results found for: CHOL, TRIG, HDL, CHOLHDL, VLDL, LDLCALC     Assessment and plan:  Alcoholic cirrhosis  of the liver without ascites: MELD score of 5 Alcohol cessation discussed Continue spironolactone, Lasix and propranolol  SMA thrombosis: INR today is therapeutic at 2.1 Discontinue Lovenox Continue Coumadin 5mg  daily Anticoagulation will probably be for the next 3 months (3/17-6/17)  Thrombocytopenia: He is at high risk of bleeding given chronic liver disease, thrombocytopenia and the fact that he is on an anticoagulant. Repeat CBC today.          Jaclyn ShaggyEnobong, Amao, MD. [facility] [phone] 01/31/2016, 10:50 AM

## 2016-02-05 ENCOUNTER — Encounter: Payer: Self-pay | Admitting: Family Medicine

## 2016-02-05 ENCOUNTER — Encounter: Payer: Self-pay | Admitting: Clinical

## 2016-02-05 ENCOUNTER — Ambulatory Visit: Payer: Self-pay | Attending: Family Medicine | Admitting: Family Medicine

## 2016-02-05 VITALS — BP 96/62 | HR 60 | Temp 98.5°F | Resp 16 | Ht 67.0 in | Wt 151.6 lb

## 2016-02-05 DIAGNOSIS — K55069 Acute infarction of intestine, part and extent unspecified: Secondary | ICD-10-CM

## 2016-02-05 DIAGNOSIS — I81 Portal vein thrombosis: Secondary | ICD-10-CM

## 2016-02-05 DIAGNOSIS — D696 Thrombocytopenia, unspecified: Secondary | ICD-10-CM

## 2016-02-05 DIAGNOSIS — IMO0002 Reserved for concepts with insufficient information to code with codable children: Principal | ICD-10-CM

## 2016-02-05 DIAGNOSIS — K7030 Alcoholic cirrhosis of liver without ascites: Secondary | ICD-10-CM

## 2016-02-05 LAB — POCT INR: INR: 2.1

## 2016-02-05 NOTE — Progress Notes (Signed)
Subjective:    Patient ID: Kenneth [name], male    DOB: Jun 07, 1961, 55 y.o.   MRN: [id number]  HPI  55 year old male with a history of alcoholic cirrhosis of the liver , thrombocytopenia, SMA thrombosis (currently on anticoagulation with Coumadin) who comes into the clinic for follow-up visit. At his last office visit his INR was 2.1  and Lovenox was discontinued. Today his INR is 3.6. He is currently working on the application for [employer] He has no other complaints today.   Past Medical History  Diagnosis Date  . Alcohol abuse   . Polysubstance abuse   . H/O [facility] spotted fever     age 41 or 62  . Upper GI bleed 08/31/2014  . Esophageal varices (HCC) 09/02/2014  . Portal hypertension with esophageal varices (HCC) 09/02/2014  . Portal hypertensive gastropathy 09/02/2014  . Hiatal hernia 09/02/2014  . Thrombocytopenia (HCC) 09/02/2014    Past Surgical History  Procedure Laterality Date  . Eye surgery    . Esophagogastroduodenoscopy Left 09/01/2014    Procedure: ESOPHAGOGASTRODUODENOSCOPY (EGD);  Surgeon: Charna Elizabeth, MD;  Location: [facility] ENDOSCOPY;  Service: Endoscopy;  Laterality: Left;    Current Outpatient Prescriptions on File Prior to Visit  Medication Sig Dispense Refill  . citalopram (CELEXA) 20 MG tablet Take 1 tablet (20 mg total) by mouth daily. 30 tablet 1  . clonazePAM (KLONOPIN) 0.5 MG tablet Take 1 tablet (0.5 mg total) by mouth at bedtime as needed for anxiety. 12 tablet 0  . furosemide (LASIX) 40 MG tablet Take 1 tablet (40 mg total) by mouth 2 (two) times daily. 60 tablet 1  . Multiple Vitamin (MULTIVITAMIN WITH MINERALS) TABS tablet Take 0.5 tablets by mouth daily.    . propranolol (INDERAL) 10 MG tablet Take 0.5 tablets (5 mg total) by mouth 3 (three) times daily. 90 tablet 1  . spironolactone (ALDACTONE) 25 MG tablet Take 1 tablet (25 mg total) by mouth daily. 30 tablet 1  . warfarin (COUMADIN) 5 MG tablet Take 1 tablet (5 mg total) by mouth  one time only at 6 PM. 30 tablet 1   No current facility-administered medications on file prior to visit.      Review of Systems  Constitutional: Negative for activity change and appetite change.  HENT: Negative for sinus pressure and sore throat.   Eyes: Negative for visual disturbance.  Respiratory: Negative for cough, chest tightness and shortness of breath.   Cardiovascular: Negative for chest pain and leg swelling.  Gastrointestinal: Negative for abdominal pain, diarrhea, constipation and abdominal distention.  Endocrine: Negative.   Genitourinary: Negative for dysuria.  Musculoskeletal: Negative for myalgias and joint swelling.  Skin: Negative for rash.  Allergic/Immunologic: Negative.   Neurological: Negative for weakness, light-headedness and numbness.  Psychiatric/Behavioral: Negative for suicidal ideas and dysphoric mood.       Objective: Filed Vitals:   02/05/16 1131  BP: 96/62  Pulse: 60  Temp: 98.5 F (36.9 C)  Resp: 16      Physical Exam Constitutional: Patient appears well-developed and well-nourished. No distress. CVS: RRR, S1/S2 +, no murmurs, no gallops, no carotid bruit.  Pulmonary: Effort and breath sounds normal, no stridor, rhonchi, wheezes, rales.  Abdominal: Soft. BS +,  Mild abdominal distension, no tenderness, rebound or guarding.  Musculoskeletal: Normal range of motion. No edema and no tenderness.  Lymphadenopathy: No lymphadenopathy noted, cervical, inguinal or axillary Neuro: Alert. Normal reflexes, muscle tone coordination. No cranial nerve deficit. Skin: Skin is warm and  dry. No rash noted. Not diaphoretic. No erythema. No pallor. Psychiatric: Normal mood and affect. Behavior, judgment, thought content normal.    CMP Latest Ref Rng 01/28/2016 01/27/2016 01/26/2016  Glucose 65 - 99 mg/dL 161(W108(H) 88 84  BUN 6 - 20 mg/dL <9(U<5(L) <0(A<5(L) <5(W<5(L)  Creatinine 0.61 - 1.24 mg/dL 0.98(J0.50(L) 1.910.62 4.780.67  Sodium 135 - 145 mmol/L 139 138 137  Potassium 3.5 - 5.1  mmol/L 3.4(L) 3.6 3.2(L)  Chloride 101 - 111 mmol/L 109 106 104  CO2 22 - 32 mmol/L 22 22 25   Calcium 8.9 - 10.3 mg/dL 2.9(F8.5(L) 6.2(Z8.6(L) 8.3(L)  Total Protein 6.5 - 8.1 g/dL - 6.4(L) 6.6  Total Bilirubin 0.3 - 1.2 mg/dL - 1.1 3.0(Q1.3(H)  Alkaline Phos 38 - 126 U/L - 88 89  AST 15 - 41 U/L - 36 28  ALT 17 - 63 U/L - 20 16(L)    CBC Latest Ref Rng 01/31/2016 01/29/2016 01/28/2016  WBC 3.8 - 10.8 K/uL 6.1 5.5 4.0  Hemoglobin 13.2 - 17.1 g/dL 65.714.8 84.613.8 12.8(L)  Hematocrit 38.5 - 50.0 % 42.6 40.8 37.8(L)  Platelets 140 - 400 K/uL 104(L) 65(L) 60(L)        Assessment & Plan:  Alcoholic cirrhosis of the liver without ascites: MELD score of 5  advised to taper off clonazepam ; 1 tablet every other night until completed. Continue spironolactone, Lasix and propranolol  SMA thrombosis: INR today  supratherapeutic at 3.6  Hold Coumadin for 2 days then resume 5 mg daily starting on Thursday and take 2.5 mg on Saturday and Sunday. Repeat INR in 1 week Anticoagulation will probably be for the next 3 months (3/17-6/17)  Thrombocytopenia : Likely alcohol related. Improved

## 2016-02-05 NOTE — Progress Notes (Signed)
[facility] 02/05/2016 02/05/2016  Decreased Interest 3 3  Down, Depressed, Hopeless 3 3  PHQ - 2 Score 6 6  Altered sleeping 1 -  Tired, decreased energy 3 -  Change in appetite 1 -  Feeling bad or failure about yourself  3 -  Trouble concentrating 3 -  Moving slowly or fidgety/restless 3 -  Suicidal thoughts 0 -  PHQ-9 Score 20 -   *[facility]  GAD 7 : Generalized Anxiety Score 02/05/2016  Nervous, Anxious, on Edge 1  Control/stop worrying 2  Worry too much - different things 2  Trouble relaxing 2  Restless 1  Easily annoyed or irritable 1  Afraid - awful might happen 2  Total GAD 7 Score 11

## 2016-02-05 NOTE — Progress Notes (Signed)
Patient's here for f/up for liver cirrhosis and inr.  Patient requesting med refill.

## 2016-02-05 NOTE — Patient Instructions (Signed)
Hold Coumadin for 2 days - today and tomorrow; resume 5 mg  daily except on Saturday and Sunday when you will take 2.5 mg on each day . Keep your appointment in 1 week to have your INR repeated.

## 2016-02-12 ENCOUNTER — Ambulatory Visit: Payer: Self-pay | Attending: Family Medicine | Admitting: Family Medicine

## 2016-02-12 ENCOUNTER — Encounter: Payer: Self-pay | Admitting: Family Medicine

## 2016-02-12 DIAGNOSIS — Z9889 Other specified postprocedural states: Secondary | ICD-10-CM | POA: Insufficient documentation

## 2016-02-12 DIAGNOSIS — K55069 Acute infarction of intestine, part and extent unspecified: Secondary | ICD-10-CM | POA: Insufficient documentation

## 2016-02-12 DIAGNOSIS — G47419 Narcolepsy without cataplexy: Secondary | ICD-10-CM | POA: Insufficient documentation

## 2016-02-12 DIAGNOSIS — Z7901 Long term (current) use of anticoagulants: Secondary | ICD-10-CM | POA: Insufficient documentation

## 2016-02-12 DIAGNOSIS — Z79899 Other long term (current) drug therapy: Secondary | ICD-10-CM | POA: Insufficient documentation

## 2016-02-12 DIAGNOSIS — I81 Portal vein thrombosis: Secondary | ICD-10-CM

## 2016-02-12 DIAGNOSIS — D696 Thrombocytopenia, unspecified: Secondary | ICD-10-CM | POA: Insufficient documentation

## 2016-02-12 DIAGNOSIS — K766 Portal hypertension: Secondary | ICD-10-CM | POA: Insufficient documentation

## 2016-02-12 DIAGNOSIS — IMO0002 Reserved for concepts with insufficient information to code with codable children: Secondary | ICD-10-CM

## 2016-02-12 DIAGNOSIS — F17210 Nicotine dependence, cigarettes, uncomplicated: Secondary | ICD-10-CM | POA: Insufficient documentation

## 2016-02-12 DIAGNOSIS — G4700 Insomnia, unspecified: Secondary | ICD-10-CM | POA: Insufficient documentation

## 2016-02-12 DIAGNOSIS — K7030 Alcoholic cirrhosis of liver without ascites: Secondary | ICD-10-CM | POA: Insufficient documentation

## 2016-02-12 DIAGNOSIS — K3189 Other diseases of stomach and duodenum: Secondary | ICD-10-CM | POA: Insufficient documentation

## 2016-02-12 DIAGNOSIS — K449 Diaphragmatic hernia without obstruction or gangrene: Secondary | ICD-10-CM | POA: Insufficient documentation

## 2016-02-12 MED ORDER — WARFARIN SODIUM 5 MG PO TABS: 5.0000 mg | ORAL_TABLET | Freq: Once | ORAL | Status: DC

## 2016-02-12 MED ORDER — TRAZODONE HCL 100 MG PO TABS: 100.0000 mg | ORAL_TABLET | Freq: Every evening | ORAL | Status: DC | PRN

## 2016-02-12 MED FILL — traZODone HCL 100 MG TABS: 100 | 30 days supply | Qty: 30 | Fill #0

## 2016-02-12 NOTE — Progress Notes (Signed)
Subjective:    Patient ID: Kenneth [name], male    DOB: 1961/01/15, 55 y.o.   MRN: [id number]  HPI 55 year old male with a history of alcoholic cirrhosis of the liver , thrombocytopenia, SMA thrombosis (currently on anticoagulation with Coumadin) who comes into the clinic for a follow-up visit. At his last office visit his INR was 3.6 and he Coumadin for 2 days after which he resumed 2.5 mg on Saturday and Sunday and 5 mg on other days. Today his INR is 2.3. He is currently working on the application for [employer].  He is requesting a refill of Klonopin which he said helped him sleep as he had frequent nighttime awakenings every 2 hours. 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 He was informed he would need a sleep study.  Past Medical History  Diagnosis Date  . Alcohol abuse   . Polysubstance abuse   . H/O [facility] spotted fever     age 76 or 18  . Upper GI bleed 08/31/2014  . Esophageal varices (HCC) 09/02/2014  . Portal hypertension with esophageal varices (HCC) 09/02/2014  . Portal hypertensive gastropathy 09/02/2014  . Hiatal hernia 09/02/2014  . Thrombocytopenia (HCC) 09/02/2014    Past Surgical History  Procedure Laterality Date  . Eye surgery    . Esophagogastroduodenoscopy Left 09/01/2014    Procedure: ESOPHAGOGASTRODUODENOSCOPY (EGD);  Surgeon: Charna Elizabeth, MD;  Location: [facility];  Service: Endoscopy;  Laterality: Left;    Social History   Social History  . Marital Status: Single    Spouse Name: N/A  . Number of Children: N/A  . Years of Education: N/A   Occupational History  . Not on file.   Social History Main Topics  . Smoking status: Current Every Day Smoker -- 1.00 packs/day for 2 years    Types: Cigarettes  . Smokeless tobacco: Never Used  . Alcohol Use: Yes     Comment: 7 beers per day  .  Drug Use: Yes    Special: Marijuana     Comment: occasional marijuana  . Sexual Activity: Not on file   Other Topics Concern  . Not on file   Social History Narrative    Allergies  Allergen Reactions  . Latex Swelling and Rash    Current Outpatient Prescriptions on File Prior to Visit  Medication Sig Dispense Refill  . citalopram (CELEXA) 20 MG tablet Take 1 tablet (20 mg total) by mouth daily. 30 tablet 1  . furosemide (LASIX) 40 MG tablet Take 1 tablet (40 mg total) by mouth 2 (two) times daily. 60 tablet 1  . Multiple Vitamin (MULTIVITAMIN WITH MINERALS) TABS tablet Take 0.5 tablets by mouth daily.    . propranolol (INDERAL) 10 MG tablet Take 0.5 tablets (5 mg total) by mouth 3 (three) times daily. 90 tablet 1  . spironolactone (ALDACTONE) 25 MG tablet Take 1 tablet (25 mg total) by mouth daily. 30 tablet 1   No current facility-administered medications on file prior to visit.      Review of Systems  Constitutional: Negative for activity change and appetite change.  HENT: Negative for sinus pressure and sore throat.   Respiratory: Negative for chest tightness, shortness of breath and wheezing.   Cardiovascular: Negative for chest pain and palpitations.  Gastrointestinal: Negative for abdominal pain, constipation and abdominal distention.  Genitourinary: Negative.   Musculoskeletal: Negative.   Psychiatric/Behavioral: Positive for sleep disturbance. Negative for behavioral problems and dysphoric mood.       Objective: Filed Vitals:   02/12/16 1445  BP: 108/68  Pulse: 60  Temp: 98 F (36.7 C)  TempSrc: Oral  Resp: 16  Height: 5\' 7"  (1.702 m)  Weight: 153 lb 12.8 oz (69.763 kg)  SpO2: 99%      Physical Exam  Constitutional: Patient appears well-developed and well-nourished. No distress. CVS: RRR, S1/S2 +, no murmurs, no gallops, no carotid bruit.  Pulmonary: Effort and breath sounds normal, no stridor, rhonchi, wheezes, rales.  Abdominal: Soft. BS +,  Mild  abdominal distension, no tenderness, rebound or guarding.  Musculoskeletal: Normal range of motion. No edema and no tenderness.  Lymphadenopathy: No lymphadenopathy noted, cervical, inguinal or axillary Neuro: Alert. Normal reflexes, muscle tone coordination. No cranial nerve deficit. Skin: Skin is warm and dry. No rash noted. Not diaphoretic. No erythema. No pallor. Psychiatric: Normal mood and affect. Behavior, judgment, thought content normal.       Assessment & Plan:  Alcoholic cirrhosis of the liver without ascites: MELD score of 5 Continue spironolactone, Lasix and propranolol Gi referral awaiting his obtaining the [place] discount  SMA thrombosis: INR today  Is therapeutic at 2.3 Continue Coumadin 5 mg daily and 2.5 mg on Saturdays and Sundays Repeat INR in 1 week Anticoagulation will probably be for the next 3 months (3/17-6/17)  Insomnia He was on Klonopin which he said helped him sleep and I have discussed the habit forming nature of Klonopin We'll place on trazodone and reassess at next visit for improvement in his symptoms He informs me he was diagnosed with Narcolepsy by a previous doctor   This note has been created with Education officer, environmentalDragon speech recognition software and smart phrase technology. Any transcriptional errors are unintentional.

## 2016-02-12 NOTE — Patient Instructions (Signed)
Insomnia Insomnia is a sleep disorder that makes it difficult to fall asleep or to stay asleep. Insomnia can cause tiredness (fatigue), low energy, difficulty concentrating, mood swings, and poor performance at work or school.  There are three different ways to classify insomnia:  Difficulty falling asleep.  Difficulty staying asleep.  Waking up too early in the morning. Any type of insomnia can be long-term (chronic) or short-term (acute). Both are common. Short-term insomnia usually lasts for three months or less. Chronic insomnia occurs at least three times a week for longer than three months. CAUSES  Insomnia may be caused by another condition, situation, or substance, such as:  Anxiety.  Certain medicines.  Gastroesophageal reflux disease (GERD) or other gastrointestinal conditions.  Asthma or other breathing conditions.  Restless legs syndrome, sleep apnea, or other sleep disorders.  Chronic pain.  Menopause. This may include hot flashes.  Stroke.  Abuse of alcohol, tobacco, or illegal drugs.  Depression.  Caffeine.   Neurological disorders, such as Alzheimer disease.  An overactive thyroid (hyperthyroidism). The cause of insomnia may not be known. RISK FACTORS Risk factors for insomnia include:  Gender. Women are more commonly affected than men.  Age. Insomnia is more common as you get older.  Stress. This may involve your professional or personal life.  Income. Insomnia is more common in people with lower income.  Lack of exercise.   Irregular work schedule or night shifts.  Traveling between different time zones. SIGNS AND SYMPTOMS If you have insomnia, trouble falling asleep or trouble staying asleep is the main symptom. This may lead to other symptoms, such as:  Feeling fatigued.  Feeling nervous about going to sleep.  Not feeling rested in the morning.  Having trouble concentrating.  Feeling irritable, anxious, or depressed. TREATMENT   Treatment for insomnia depends on the cause. If your insomnia is caused by an underlying condition, treatment will focus on addressing the condition. Treatment may also include:   Medicines to help you sleep.  Counseling or therapy.  Lifestyle adjustments. HOME CARE INSTRUCTIONS   Take medicines only as directed by your health care provider.  Keep regular sleeping and waking hours. Avoid naps.  Keep a sleep diary to help you and your health care provider figure out what could be causing your insomnia. Include:   When you sleep.  When you wake up during the night.  How well you sleep.   How rested you feel the next day.  Any side effects of medicines you are taking.  What you eat and drink.   Make your bedroom a comfortable place where it is easy to fall asleep:  Put up shades or special blackout curtains to block light from outside.  Use a white noise machine to block noise.  Keep the temperature cool.   Exercise regularly as directed by your health care provider. Avoid exercising right before bedtime.  Use relaxation techniques to manage stress. Ask your health care provider to suggest some techniques that may work well for you. These may include:  Breathing exercises.  Routines to release muscle tension.  Visualizing peaceful scenes.  Cut back on alcohol, caffeinated beverages, and cigarettes, especially close to bedtime. These can disrupt your sleep.  Do not overeat or eat spicy foods right before bedtime. This can lead to digestive discomfort that can make it hard for you to sleep.  Limit screen use before bedtime. This includes:  Watching TV.  Using your smartphone, tablet, and computer.  Stick to a routine. This   can help you fall asleep faster. Try to do a quiet activity, brush your teeth, and go to bed at the same time each night.  Get out of bed if you are still awake after 15 minutes of trying to sleep. Keep the lights down, but try reading or  doing a quiet activity. When you feel sleepy, go back to bed.  Make sure that you drive carefully. Avoid driving if you feel very sleepy.  Keep all follow-up appointments as directed by your health care provider. This is important. SEEK MEDICAL CARE IF:   You are tired throughout the day or have trouble in your daily routine due to sleepiness.  You continue to have sleep problems or your sleep problems get worse. SEEK IMMEDIATE MEDICAL CARE IF:   You have serious thoughts about hurting yourself or someone else.   This information is not intended to replace advice given to you by your health care provider. Make sure you discuss any questions you have with your health care provider.   Document Released: 10/10/2000 Document Revised: 07/04/2015 Document Reviewed: 07/14/2014 Elsevier Interactive Patient Education 2016 Elsevier Inc.  

## 2016-02-12 NOTE — Progress Notes (Signed)
Patient's here for 1wk f/up and INR level.  Patient requesting rx for clonazepam.

## 2016-02-21 ENCOUNTER — Ambulatory Visit: Payer: Self-pay | Attending: Family Medicine | Admitting: Family Medicine

## 2016-02-21 ENCOUNTER — Encounter: Payer: Self-pay | Admitting: Family Medicine

## 2016-02-21 VITALS — BP 94/61 | HR 56 | Temp 98.0°F | Resp 16 | Ht 67.0 in | Wt 151.0 lb

## 2016-02-21 DIAGNOSIS — I81 Portal vein thrombosis: Secondary | ICD-10-CM

## 2016-02-21 DIAGNOSIS — G4700 Insomnia, unspecified: Secondary | ICD-10-CM | POA: Insufficient documentation

## 2016-02-21 DIAGNOSIS — K55069 Acute infarction of intestine, part and extent unspecified: Secondary | ICD-10-CM | POA: Insufficient documentation

## 2016-02-21 DIAGNOSIS — Z79899 Other long term (current) drug therapy: Secondary | ICD-10-CM | POA: Insufficient documentation

## 2016-02-21 DIAGNOSIS — IMO0002 Reserved for concepts with insufficient information to code with codable children: Secondary | ICD-10-CM

## 2016-02-21 DIAGNOSIS — K7031 Alcoholic cirrhosis of liver with ascites: Secondary | ICD-10-CM | POA: Insufficient documentation

## 2016-02-21 NOTE — Patient Instructions (Signed)
Cirrhosis °Cirrhosis is long-term (chronic) liver injury. The liver is your largest internal organ, and it performs many functions. The liver converts food into energy, removes toxic material from your blood, makes important proteins, and absorbs necessary vitamins from your diet. °If you have cirrhosis, it means many of your healthy liver cells have been replaced by scar tissue. This prevents blood from flowing through your liver, which makes it difficult for your liver to function. This scarring is not reversible, but treatment can prevent it from getting worse.  °CAUSES  °Hepatitis C and long-term alcohol abuse are the most common causes of cirrhosis. Other causes include: °· Nonalcoholic fatty liver disease. °· Hepatitis B infection. °· Autoimmune hepatitis. °· Diseases that cause blockage of ducts inside the liver. °· Inherited liver diseases. °· Reactions to certain long-term medicines. °· Parasitic infections. °· Long-term exposure to certain toxins. °RISK FACTORS °You may have a higher risk of cirrhosis if you: °· Have certain hepatitis viruses. °· Abuse alcohol, especially if you are male. °· Are overweight. °· Share needles. °· Have unprotected sex with someone who has hepatitis. °SYMPTOMS  °You may not have any signs and symptoms at first. Symptoms may not develop until the damage to your liver starts to get worse. Signs and symptoms of cirrhosis may include:  °· Tenderness in the right-upper part of your abdomen. °· Weakness and tiredness (fatigue). °· Loss of appetite. °· Nausea. °· Weight loss and muscle loss. °· Itchiness. °· Yellow skin and eyes (jaundice). °· Buildup of fluid in the abdomen (ascites). °· Swelling of the feet and ankles (edema). °· Appearance of tiny blood vessels under the skin. °· Mental confusion. °· Easy bruising and bleeding. °DIAGNOSIS  °Your health care provider may suspect cirrhosis based on your symptoms and medical history, especially if you have other medical conditions  or a history of alcohol abuse. Your health care provider will do a physical exam to feel your liver and check for signs of cirrhosis. Your health care provider may perform other tests, including:  °· Blood tests to check:   °¨ Whether you have hepatitis B or C.   °¨ Kidney function. °¨ Liver function. °· Imaging tests such as: °¨ MRI or CT scan to look for changes seen in advanced cirrhosis. °¨ Ultrasound to see if normal liver tissue is being replaced by scar tissue. °· A procedure using a long needle to take a sample of liver tissue (biopsy) for examination under a microscope. Liver biopsy can confirm the diagnosis of cirrhosis.   °TREATMENT  °Treatment depends on how damaged your liver is and what caused the damage. Treatment may include treating cirrhosis symptoms or treating the underlying causes of the condition to try to slow the progression of the damage. Treatment may include: °· Making lifestyle changes, such as:   °¨ Eating a healthy diet. °¨ Restricting salt intake.  °¨ Maintaining a healthy weight.   °¨ Not abusing drugs or alcohol. °· Taking medicines to: °¨ Treat liver infections or other infections. °¨ Control itching. °¨ Reduce fluid buildup. °¨ Reduce certain blood toxins. °¨ Reduce risk of bleeding from enlarged blood vessels in the stomach or esophagus (varices). °· If varices are causing bleeding problems, you may need treatment with a procedure that ties up the vessels causing them to fall off (band ligation). °· If cirrhosis is causing your liver to fail, your health care provider may recommend a liver transplant. °· Other treatments may be recommended depending on any complications of cirrhosis, such as liver-related kidney failure (hepatorenal   syndrome). °HOME CARE INSTRUCTIONS  °· Take medicines only as directed by your health care provider. Do not use drugs that are toxic to your liver. Ask your health care provider before taking any new medicines, including over-the-counter medicines.    °· Rest as needed. °· Eat a well-balanced diet. Ask your health care provider or dietitian for more information.   °· You may have to follow a low-salt diet or restrict your water intake as directed. °· Do not drink alcohol. This is especially important if you are taking acetaminophen. °· Keep all follow-up visits as directed by your health care provider. This is important. °SEEK MEDICAL CARE IF: °· You have fatigue or weakness that is getting worse. °· You develop swelling of the hands, feet, legs, or face. °· You have a fever. °· You develop loss of appetite. °· You have nausea or vomiting. °· You develop jaundice. °· You develop easy bruising or bleeding. °SEEK IMMEDIATE MEDICAL CARE IF: °· You vomit bright red blood or a material that looks like coffee grounds. °· You have blood in your stools. °· Your stools appear black and tarry. °· You become confused. °· You have chest pain or trouble breathing. °  °This information is not intended to replace advice given to you by your health care provider. Make sure you discuss any questions you have with your health care provider. °  °Document Released: 10/13/2005 Document Revised: 11/03/2014 Document Reviewed: 06/21/2014 °Elsevier Interactive Patient Education ©2016 [employer] ° °

## 2016-02-21 NOTE — Progress Notes (Signed)
Subjective:    Patient ID: Kenneth SpragueSamuel A [name], male    DOB: 1961/07/22, 55 y.o.   MRN: [id number]  HPI 55 year old male with a history of alcoholic cirrhosis of the liver , thrombocytopenia, SMA thrombosis (currently on anticoagulation with Coumadin) who comes into the clinic for a follow-up visit. At his last office visit his INR was 2.3 and he has been on Coumadin 2.5 mg on Saturday and Sunday and 5 mg on other days. Today his INR is 2.1  He was placed on trazodone at his last visit for insomnia and reports improvement in insomnia and decreased daytime somnolence. He has no new concerns today. He is currently working on the application for [employer] discount.  Past Medical History  Diagnosis Date  . Alcohol abuse   . Polysubstance abuse   [facility]     [facility] 55 [facility] [age]  . Upper GI bleed 08/31/2014  . Esophageal varices (HCC) 09/02/2014  . Portal hypertension with esophageal varices (HCC) 09/02/2014  . Portal hypertensive gastropathy 09/02/2014  . Hiatal hernia 09/02/2014  . Thrombocytopenia (HCC) 09/02/2014    Past Surgical History  Procedure Laterality Date  . Eye surgery    . Esophagogastroduodenoscopy Left 09/01/2014    Procedure: ESOPHAGOGASTRODUODENOSCOPY (EGD);  Surgeon: Charna ElizabethJyothi Mann, MD;  Location: [facility];  Service: Endoscopy;  Laterality: Left;    Allergies  Allergen Reactions  . Latex Swelling and Rash    Current Outpatient Prescriptions on File Prior to Visit  Medication Sig Dispense Refill  . citalopram (CELEXA) 20 MG tablet Take 1 tablet (20 mg total) by mouth daily. 30 tablet 1  . furosemide (LASIX) 40 MG tablet Take 1 tablet (40 mg total) by mouth 2 (two) times daily. 60 tablet 1  . Multiple Vitamin (MULTIVITAMIN WITH MINERALS) TABS tablet Take 0.5 tablets by mouth daily.    . propranolol (INDERAL) 10 MG tablet Take 0.5 tablets (5 mg total) by mouth 3 (three) times daily. 90 tablet 1  . spironolactone (ALDACTONE) 25 MG tablet Take 1  tablet (25 mg total) by mouth daily. 30 tablet 1  . traZODone (DESYREL) 100 MG tablet Take 1 tablet (100 mg total) by mouth at bedtime as needed for sleep. 30 tablet 0  . warfarin (COUMADIN) 5 MG tablet Take 1 tablet (5 mg total) by mouth one time only at 6 PM. Mon- Fri and 2.5mg  on saturdays and sundays 30 tablet 1   No current facility-administered medications on file prior to visit.      Review of Systems Constitutional: Negative for activity change and appetite change.  HENT: Negative for sinus pressure and sore throat.   Respiratory: Negative for chest tightness, shortness of breath and wheezing.   Cardiovascular: Negative for chest pain and palpitations,  Gastrointestinal: Negative for abdominal pain, constipation and positive for abdominal distention.  Genitourinary: Negative.   Musculoskeletal: Negative.   Psychiatric/Behavioral: . Negative for behavioral problems and dysphoric mood    Objective: Filed Vitals:   02/21/16 1005  BP: 94/61  Pulse: 56  Temp: 98 F (36.7 C)  TempSrc: Oral  Resp: 16  Height: 5\' 7"  (1.702 m)  Weight: 151 lb (68.493 kg)  SpO2: 95%      Physical Exam Constitutional: Patient appears well-developed and well-nourished. No distress. CVS: Bradycardic rate, regular rhythm, S1/S2 +, no murmurs, no gallops, no carotid bruit.  Pulmonary: Effort and breath sounds normal, no stridor, rhonchi, wheezes, rales.  Abdominal: Soft. BS +,  Mild abdominal distension, no  tenderness, rebound or guarding.  Musculoskeletal: Normal range of motion. No edema and no tenderness.  Lymphadenopathy: No lymphadenopathy noted, cervical, inguinal or axillary Neuro: Alert. Normal reflexes, muscle tone coordination. No cranial nerve deficit. Skin: Skin is warm and dry. No rash noted. Not diaphoretic. No erythema. No pallor. Psychiatric: Normal mood and affect. Behavior, judgment, thought content normal.        Assessment & Plan:  Alcoholic cirrhosis of the liver with  ascites: MELD score of 5 Continue spironolactone, Lasix and propranolol Gi referral pending his obtaining the [place] discount  SMA thrombosis: INR today  Is therapeutic at 2.1 Continue Coumadin 5 mg daily and 2.5 mg on Saturdays and Sundays Repeat INR in 1 week Anticoagulation will probably be for the next 3 months (3/17-6/17)  Insomnia Improved with use of Trazodone Decreased daytime somnolence He informs me he was diagnosed with Narcolepsy by a previous doctor Will still need a sleep study prior to being able to obtain DOT licensce  This note has been created with Education officer, environmental. Any transcriptional errors are unintentional.

## 2016-02-21 NOTE — Progress Notes (Signed)
Patient's here for f/up insomnia and INR.  Patient denies any pain today.

## 2016-02-22 ENCOUNTER — Encounter: Payer: Self-pay | Admitting: Clinical

## 2016-02-22 NOTE — Progress Notes (Signed)
[facility] 02/21/2016 02/12/2016 02/05/2016 02/05/2016  Decreased Interest 1 0 3 3  Down, Depressed, Hopeless 1 0 3 3  PHQ - 2 Score 2 0 6 6  Altered sleeping 1 - 1 -  Tired, decreased energy 2 - 3 -  Change in appetite 1 - 1 -  Feeling bad or failure about yourself  1 - 3 -  Trouble concentrating 1 - 3 -  Moving slowly or fidgety/restless 2 - 3 -  Suicidal thoughts 0 - 0 -  PHQ-9 Score 10 - 20 -    GAD 7 : Generalized Anxiety Score 02/21/2016 02/05/2016  Nervous, Anxious, on Edge 2 1  Control/stop worrying 2 2  Worry too much - different things 2 2  Trouble relaxing 2 2  Restless 1 1  Easily annoyed or irritable 1 1  Afraid - awful might happen 2 2  Total GAD 7 Score 12 11

## 2016-02-28 ENCOUNTER — Ambulatory Visit: Payer: Self-pay | Attending: Family Medicine | Admitting: Family Medicine

## 2016-02-28 ENCOUNTER — Encounter: Payer: Self-pay | Admitting: Family Medicine

## 2016-02-28 VITALS — BP 105/68 | HR 61 | Temp 98.1°F | Resp 16 | Ht 67.0 in | Wt 156.0 lb

## 2016-02-28 DIAGNOSIS — Z79899 Other long term (current) drug therapy: Secondary | ICD-10-CM | POA: Insufficient documentation

## 2016-02-28 DIAGNOSIS — IMO0002 Reserved for concepts with insufficient information to code with codable children: Secondary | ICD-10-CM

## 2016-02-28 DIAGNOSIS — K4090 Unilateral inguinal hernia, without obstruction or gangrene, not specified as recurrent: Secondary | ICD-10-CM | POA: Insufficient documentation

## 2016-02-28 DIAGNOSIS — D696 Thrombocytopenia, unspecified: Secondary | ICD-10-CM | POA: Insufficient documentation

## 2016-02-28 DIAGNOSIS — Z7901 Long term (current) use of anticoagulants: Secondary | ICD-10-CM | POA: Insufficient documentation

## 2016-02-28 DIAGNOSIS — I81 Portal vein thrombosis: Secondary | ICD-10-CM | POA: Insufficient documentation

## 2016-02-28 DIAGNOSIS — K55069 Acute infarction of intestine, part and extent unspecified: Secondary | ICD-10-CM

## 2016-02-28 DIAGNOSIS — K7031 Alcoholic cirrhosis of liver with ascites: Secondary | ICD-10-CM | POA: Insufficient documentation

## 2016-02-28 LAB — POCT INR: INR: 3.5

## 2016-02-28 NOTE — Progress Notes (Signed)
Patient's here for f/up 1 week for Cirrhosis and INR.  Patient denies any pain today.  Patient requesting med refills

## 2016-02-28 NOTE — Progress Notes (Signed)
Subjective:  Patient ID: Kenneth [name], male    DOB: [birth year]  Age: 55 y.o. MRN: [id number]  CC: Cirrhosis; Coagulation Disorder; and Follow-up   HPI Kenneth [name] is a 55 year old male with a history of alcoholic cirrhosis of the liver , thrombocytopenia, SMA thrombosis (currently on anticoagulation with Coumadin) who comes into the clinic for a follow-up visit. At his last office visit his INR was 2.1 and he has been on Coumadin 2.5 mg on Saturday and Sunday and 5 mg on other days. Today his INR is 3.5 and he has had no new medications added to his list and his diet has remained the same.  He is concerned about a possible right inguinal hernia which he noticed not long ago but denies any pain;  also complains of a palpable non tender lump in his right inguinal area which she has had for several years but thinks has increased in size lately. Denies abdominal pain, nausea or vomiting.  Outpatient Prescriptions Prior to Visit  Medication Sig Dispense Refill  . citalopram (CELEXA) 20 MG tablet Take 1 tablet (20 mg total) by mouth daily. 30 tablet 1  . furosemide (LASIX) 40 MG tablet Take 1 tablet (40 mg total) by mouth 2 (two) times daily. 60 tablet 1  . Multiple Vitamin (MULTIVITAMIN WITH MINERALS) TABS tablet Take 0.5 tablets by mouth daily.    . propranolol (INDERAL) 10 MG tablet Take 0.5 tablets (5 mg total) by mouth 3 (three) times daily. 90 tablet 1  . spironolactone (ALDACTONE) 25 MG tablet Take 1 tablet (25 mg total) by mouth daily. 30 tablet 1  . traZODone (DESYREL) 100 MG tablet Take 1 tablet (100 mg total) by mouth at bedtime as needed for sleep. 30 tablet 0  . warfarin (COUMADIN) 5 MG tablet Take 1 tablet (5 mg total) by mouth one time only at 6 PM. Mon- Fri and 2.5mg  on saturdays and sundays 30 tablet 1   No facility-administered medications prior to visit.    ROS Review of Systems  Constitutional: Negative for activity change and appetite change.  HENT: Negative for  sinus pressure and sore throat.   Respiratory: Negative for chest tightness, shortness of breath and wheezing.   Cardiovascular: Negative for chest pain and palpitations.  Gastrointestinal: Negative for abdominal pain, constipation, blood in stool and abdominal distention.  Genitourinary:       Inguinal hernia  Musculoskeletal: Negative.   Psychiatric/Behavioral: Negative for behavioral problems and dysphoric mood.    Objective:  BP 105/68 mmHg  Pulse 61  Temp(Src) 98.1 F (36.7 C) (Oral)  Resp 16  Ht  (1.702 m)  Wt 156 lb (70.761 kg)  BMI 24.43 kg/m2  SpO2 96%  BP/Weight 02/28/2016 02/21/2016 02/12/2016  Systolic BP 105 94 108  Diastolic BP 68 61 68  Wt. (Lbs) 156 151 153.8  BMI 24.43 23.64 24.08      Physical Exam Constitutional: Patient appears well-developed and well-nourished. No distress. CVS: normal rate, regular rhythm, S1/S2 +, no murmurs, no gallops, no carotid bruit.  Pulmonary: Effort and breath sounds normal, no stridor, rhonchi, wheezes, rales.  Abdominal: Soft. BS +,  Mild abdominal distension, no tenderness, rebound or guarding.  Musculoskeletal: Normal range of motion. No edema and no tenderness.  Lymphadenopathy: No lymphadenopathy noted, cervical, inguinal or axillary Genitourinary: Right inguinal hernia, possible 2 x 2 centimeter nontender lump attached to the spermatic cord  Neuro: Alert. Normal reflexes, muscle tone coordination. No cranial nerve deficit. Skin: Skin is warm  and dry. No rash noted. Not diaphoretic. No erythema. No pallor. Psychiatric: Normal mood and affect. Behavior, judgment, thought content normal  Assessment & Plan:   1. Alcoholic cirrhosis of liver with ascites  Continue spironolactone, Lasix and propranolol Gi referral pending his obtaining the [place] discount    2. Unilateral inguinal hernia without obstruction or gangrene, recurrence not specified Explained to the patient that this is likely secondary to his  ascites. He also has an inguinal lump that has been present for years and I would love to place an ultrasound ordered. Advised to expedite [place] discomfort process to facilitate these.  3. Superior mesenteric vein thrombosis (HCC) INR is 3.5 which is supratherapeutic (goal is 2.0-3.0) Hold Coumadin today and resume 2.5 mg on Fridays sad today Sunday and Wednesday and 5 mg on other days We'll check INR next week - POCT INR     No orders of the defined types were placed in this encounter.    Follow-up: Return in about 1 week (around 03/06/2016) for follow up of SMA thrombosis.   Jaclyn ShaggyEnobong Amao MD

## 2016-02-28 NOTE — Patient Instructions (Signed)
Change Coumadin dose to 2.5mg  on Friday, Saturday, Sunday and Wednesday and 5mg  on other days.

## 2016-02-29 ENCOUNTER — Other Ambulatory Visit: Payer: Self-pay

## 2016-02-29 DIAGNOSIS — G4700 Insomnia, unspecified: Secondary | ICD-10-CM

## 2016-02-29 DIAGNOSIS — IMO0002 Reserved for concepts with insufficient information to code with codable children: Secondary | ICD-10-CM

## 2016-02-29 DIAGNOSIS — K55069 Acute infarction of intestine, part and extent unspecified: Secondary | ICD-10-CM

## 2016-02-29 MED ORDER — TRAZODONE HCL 100 MG PO TABS: 100.0000 mg | ORAL_TABLET | Freq: Every evening | ORAL | Status: DC | PRN

## 2016-02-29 MED ORDER — WARFARIN SODIUM 5 MG PO TABS: 5.0000 mg | ORAL_TABLET | Freq: Once | ORAL | Status: DC

## 2016-02-29 MED FILL — WARFARIN SODIUM 5 MG TABLET: 5 | 30 days supply | Qty: 30 | Fill #0

## 2016-02-29 MED FILL — ?CITALOPRAM HBR 20 MG TABLE: 20 | 30 days supply | Qty: 30 | Fill #0

## 2016-02-29 MED FILL — PROPRANOLOL 10 MG TABLET: 10 | 90 days supply | Qty: 125 | Fill #0

## 2016-02-29 MED FILL — ?FUROSEMIDE 40 MG TABLET: 40 | 30 days supply | Qty: 60 | Fill #0

## 2016-02-29 NOTE — Progress Notes (Signed)
Patient came in the clinic today, 02/29/16 stating that Dr. Venetia NightAmao was suppose to refill all his medications. After looking into patient current meds, patient Celexa, Lasix, Propranolol, Spironolactone was rx'd by another provider and sent over to [facility] on [address] with 1 refill left.   Patient's warfarin had 1refill left. Per Sajel she couldn't see the 1 refill left in the system, so I reordered it with 0 refills. Per protocol patient Trazodone was ordered with 2 refills

## 2016-03-06 ENCOUNTER — Encounter: Payer: Self-pay | Admitting: Family Medicine

## 2016-03-06 ENCOUNTER — Ambulatory Visit: Payer: Self-pay | Attending: Family Medicine | Admitting: Family Medicine

## 2016-03-06 ENCOUNTER — Other Ambulatory Visit: Payer: Self-pay | Admitting: Family Medicine

## 2016-03-06 VITALS — BP 99/65 | HR 58 | Temp 98.1°F | Resp 14 | Ht 67.0 in | Wt 157.4 lb

## 2016-03-06 DIAGNOSIS — Z79899 Other long term (current) drug therapy: Secondary | ICD-10-CM | POA: Insufficient documentation

## 2016-03-06 DIAGNOSIS — K7031 Alcoholic cirrhosis of liver with ascites: Secondary | ICD-10-CM | POA: Insufficient documentation

## 2016-03-06 DIAGNOSIS — I81 Portal vein thrombosis: Secondary | ICD-10-CM | POA: Insufficient documentation

## 2016-03-06 DIAGNOSIS — K55069 Acute infarction of intestine, part and extent unspecified: Secondary | ICD-10-CM

## 2016-03-06 DIAGNOSIS — IMO0002 Reserved for concepts with insufficient information to code with codable children: Secondary | ICD-10-CM

## 2016-03-06 LAB — POCT INR: INR: 1.8

## 2016-03-06 MED ORDER — WARFARIN SODIUM 5 MG PO TABS: ORAL_TABLET | ORAL | Status: DC

## 2016-03-06 MED ORDER — FUROSEMIDE 40 MG PO TABS: 40.0000 mg | ORAL_TABLET | Freq: Two times a day (BID) | ORAL | Status: DC

## 2016-03-06 MED ORDER — PROPRANOLOL HCL 10 MG PO TABS: 5.0000 mg | ORAL_TABLET | Freq: Three times a day (TID) | ORAL | Status: DC

## 2016-03-06 MED ORDER — SPIRONOLACTONE 25 MG PO TABS: 25.0000 mg | ORAL_TABLET | Freq: Every day | ORAL | Status: DC

## 2016-03-06 MED FILL — SPIRONOLACTONE 25 MG TABLET: 25 | 30 days supply | Qty: 30 | Fill #0

## 2016-03-06 NOTE — Progress Notes (Signed)
Subjective:  Patient ID: Kenneth [name], male    DOB: [birth year]  Age: 55 y.o. MRN: [id number]  CC: Follow-up and Coagulation Disorder   HPI Kenneth [name] is a 55 year old male with a history of alcoholic cirrhosis of the liver , thrombocytopenia, SMA thrombosis (currently on anticoagulation with Coumadin) who comes into the clinic for a follow-up visit. At his last office visit his INR was 3.5 and Coumadin was held for 1 day and resumed at 2.5 mg on Wednesday, Friday, Saturday and Sunday .Today his INR is 1.8 and he endorses missing his Monday's dose of Coumadin.   Outpatient Prescriptions Prior to Visit  Medication Sig Dispense Refill  . citalopram (CELEXA) 20 MG tablet Take 1 tablet (20 mg total) by mouth daily. 30 tablet 1  . Multiple Vitamin (MULTIVITAMIN WITH MINERALS) TABS tablet Take 0.5 tablets by mouth daily.    . traZODone (DESYREL) 100 MG tablet Take 1 tablet (100 mg total) by mouth at bedtime as needed for sleep. 30 tablet 2  . furosemide (LASIX) 40 MG tablet Take 1 tablet (40 mg total) by mouth 2 (two) times daily. 60 tablet 1  . propranolol (INDERAL) 10 MG tablet Take 0.5 tablets (5 mg total) by mouth 3 (three) times daily. 90 tablet 1  . spironolactone (ALDACTONE) 25 MG tablet Take 1 tablet (25 mg total) by mouth daily. 30 tablet 1  . warfarin (COUMADIN) 5 MG tablet Take 1 tablet (5 mg total) by mouth one time only at 6 PM. Mon- Fri and 2.5mg  on saturdays and sundays 30 tablet 0  . traZODone (DESYREL) 100 MG tablet Take 1 tablet (100 mg total) by mouth at bedtime as needed for sleep. 30 tablet 0  . traZODone (DESYREL) 100 MG tablet Take 1 tablet (100 mg total) by mouth at bedtime as needed for sleep. 30 tablet 3  . warfarin (COUMADIN) 5 MG tablet Take 1 tablet (5 mg total) by mouth one time only at 6 PM. Mon- Fri and 2.5mg  on saturdays and sundays 30 tablet 1   No facility-administered medications prior to visit.    ROS Review of Systems Constitutional: Negative for  activity change and appetite change.  HENT: Negative for sinus pressure and sore throat.   Respiratory: Negative for chest tightness, shortness of breath and wheezing.   Cardiovascular: Negative for chest pain and palpitations.  Gastrointestinal: Negative for abdominal pain, constipation, blood in stool and abdominal distention.  Genitourinary:       Inguinal hernia  Musculoskeletal: Negative.   Psychiatric/Behavioral: Negative for behavioral problems and dysphoric mood.   Objective:  BP 99/65 mmHg  Pulse 58  Temp(Src) 98.1 F (36.7 C) (Oral)  Resp 14  Ht  (1.702 m)  Wt 157 lb 6.4 oz (71.396 kg)  BMI 24.65 kg/m2  SpO2 96%  BP/Weight 03/06/2016 02/28/2016 02/21/2016  Systolic BP 99 105 94  Diastolic BP 65 68 61  Wt. (Lbs) 157.4 156 151  BMI 24.65 24.43 23.64      Physical Exam Constitutional: Patient appears well-developed and well-nourished. No distress. CVS: normal rate, regular rhythm, S1/S2 +, no murmurs, no gallops, no carotid bruit.  Pulmonary: Effort and breath sounds normal, no stridor, rhonchi, wheezes, rales.  Abdominal: Soft. BS +,  Mild abdominal distension, no tenderness, rebound or guarding.  Musculoskeletal: Normal range of motion. No edema and no tenderness.  Lymphadenopathy: No lymphadenopathy noted, cervical, inguinal or axillary Genitourinary: Right inguinal hernia, possible 2 x 2 centimeter nontender lump attached to the spermatic  cord  Neuro: Alert. Normal reflexes, muscle tone coordination. No cranial nerve deficit. Skin: Skin is warm and dry. No rash noted. Not diaphoretic. No erythema. No pallor. Psychiatric: Normal mood and affect. Behavior, judgment, thought content normal  Assessment & Plan:   1. Superior mesenteric vein thrombosis (HCC) Subtherapeutic INR 1.8 due to missed pills No change in regimen, continue 2.5 mg on Wednesday, Friday,Saturday and Sunday and 5 mg on other days. Discussed reminders to help with medication adherence - POCT  INR - warfarin (COUMADIN) 5 MG tablet; 2.5mg  on Wednesdays, Fridays, Saturdays and Sundays and 5mg  on other days.  Dispense: 30 tablet; Refill: 0  2. Alcoholic cirrhosis of liver with ascites (HCC) Asymptomatic at this time - spironolactone (ALDACTONE) 25 MG tablet; Take 1 tablet (25 mg total) by mouth daily.  Dispense: 30 tablet; Refill: 1 - furosemide (LASIX) 40 MG tablet; Take 1 tablet (40 mg total) by mouth 2 (two) times daily.  Dispense: 60 tablet; Refill: 1 - propranolol (INDERAL) 10 MG tablet; Take 0.5 tablets (5 mg total) by mouth 3 (three) times daily.  Dispense: 90 tablet; Refill: 1   Meds ordered this encounter  Medications  . spironolactone (ALDACTONE) 25 MG tablet    Sig: Take 1 tablet (25 mg total) by mouth daily.    Dispense:  30 tablet    Refill:  1  . furosemide (LASIX) 40 MG tablet    Sig: Take 1 tablet (40 mg total) by mouth 2 (two) times daily.    Dispense:  60 tablet    Refill:  1  . propranolol (INDERAL) 10 MG tablet    Sig: Take 0.5 tablets (5 mg total) by mouth 3 (three) times daily.    Dispense:  90 tablet    Refill:  1  . warfarin (COUMADIN) 5 MG tablet    Sig: 2.5mg  on Wednesdays, Fridays, Saturdays and Sundays and 5mg  on other days.    Dispense:  30 tablet    Refill:  0    Follow-up: Return in about 2 weeks (around 03/20/2016) for follow up on SMA thrombosis.   Jaclyn ShaggyEnobong Amao MD

## 2016-03-06 NOTE — Progress Notes (Signed)
Patient's here for f/up SMA thrombosis.  Patient denies pain today.

## 2016-03-06 NOTE — Patient Instructions (Signed)
Continue Coumadin 2.5mg  on Wednesday, Friday, Saturday, Sunday and 5mg  on other days.

## 2016-03-10 ENCOUNTER — Encounter: Payer: Self-pay | Admitting: Family Medicine

## 2016-03-10 ENCOUNTER — Other Ambulatory Visit: Payer: Self-pay | Admitting: Family Medicine

## 2016-03-10 DIAGNOSIS — G4700 Insomnia, unspecified: Secondary | ICD-10-CM

## 2016-03-10 MED ORDER — TRAZODONE HCL 100 MG PO TABS: 100.0000 mg | ORAL_TABLET | Freq: Every evening | ORAL | Status: DC | PRN

## 2016-03-10 MED FILL — traZODone HCL 100 MG TABS: 100 | 30 days supply | Qty: 30 | Fill #0

## 2016-03-20 ENCOUNTER — Encounter: Payer: Self-pay | Admitting: Family Medicine

## 2016-03-20 ENCOUNTER — Ambulatory Visit: Payer: Self-pay | Attending: Family Medicine | Admitting: Family Medicine

## 2016-03-20 VITALS — BP 109/69 | HR 69 | Temp 97.8°F | Resp 16 | Ht 67.0 in | Wt 156.2 lb

## 2016-03-20 DIAGNOSIS — I81 Portal vein thrombosis: Secondary | ICD-10-CM | POA: Insufficient documentation

## 2016-03-20 DIAGNOSIS — K449 Diaphragmatic hernia without obstruction or gangrene: Secondary | ICD-10-CM | POA: Insufficient documentation

## 2016-03-20 DIAGNOSIS — K7031 Alcoholic cirrhosis of liver with ascites: Secondary | ICD-10-CM | POA: Insufficient documentation

## 2016-03-20 DIAGNOSIS — K3189 Other diseases of stomach and duodenum: Secondary | ICD-10-CM | POA: Insufficient documentation

## 2016-03-20 DIAGNOSIS — K766 Portal hypertension: Secondary | ICD-10-CM | POA: Insufficient documentation

## 2016-03-20 DIAGNOSIS — K55069 Acute infarction of intestine, part and extent unspecified: Secondary | ICD-10-CM

## 2016-03-20 DIAGNOSIS — IMO0002 Reserved for concepts with insufficient information to code with codable children: Secondary | ICD-10-CM

## 2016-03-20 DIAGNOSIS — Z79899 Other long term (current) drug therapy: Secondary | ICD-10-CM | POA: Insufficient documentation

## 2016-03-20 LAB — POCT INR: INR: 2

## 2016-03-20 MED ORDER — CITALOPRAM HYDROBROMIDE 20 MG PO TABS: 20.0000 mg | ORAL_TABLET | Freq: Every day | ORAL | Status: DC

## 2016-03-20 NOTE — Progress Notes (Signed)
Subjective:    Patient ID: Kenneth SpragueSamuel A Harder, male    DOB: [birth year], 55 y.o.   MRN: [id number]  HPI He is a 55 year old male with a history of alcoholic cirrhosis of the liver , thrombocytopenia, SMA thrombosis (Dignosed on 01/25/2016 by CT abdomen and pelvis ,currently on anticoagulation with Coumadin) who comes into the clinic for a follow-up visit. At his last office visit his INR was 1.8 and Coumadin Dose was not changed and maintained at  2.5 mg on Wednesday, Friday, Saturday and Sunday .Today his INR is 2.0  Past Medical History  Diagnosis Date  . Alcohol abuse   . Polysubstance abuse   . [facility]     age 55 or [facility]  . Upper GI bleed 08/31/2014  . Esophageal varices (HCC) 09/02/2014  . Portal hypertension with esophageal varices (HCC) 09/02/2014  . Portal hypertensive gastropathy 09/02/2014  . Hiatal hernia 09/02/2014  . Thrombocytopenia (HCC) 09/02/2014    Current Outpatient Prescriptions on File Prior to Visit  Medication Sig Dispense Refill  . furosemide (LASIX) 40 MG tablet Take 1 tablet (40 mg total) by mouth 2 (two) times daily. 60 tablet 3  . Multiple Vitamin (MULTIVITAMIN WITH MINERALS) TABS tablet Take 0.5 tablets by mouth daily.    . propranolol (INDERAL) 10 MG tablet Take 0.5 tablets (5 mg total) by mouth 3 (three) times daily. 90 tablet 3  . spironolactone (ALDACTONE) 25 MG tablet Take 1 tablet (25 mg total) by mouth daily. 30 tablet 3  . traZODone (DESYREL) 100 MG tablet Take 1 tablet (100 mg total) by mouth at bedtime as needed for sleep. 30 tablet 2  . warfarin (COUMADIN) 5 MG tablet 2.5mg  on Wednesdays, Fridays, Saturdays and Sundays and 5mg  on other days. 30 tablet 0   No current facility-administered medications on file prior to visit.      .Review of Systems  Constitutional: Negative for activity change and appetite change.  HENT: Negative for sinus pressure and sore throat.   Respiratory: Negative for chest tightness, shortness of  breath and wheezing.   Cardiovascular: Negative for chest pain and palpitations.  Gastrointestinal: Negative for abdominal pain, constipation and abdominal distention.  Genitourinary: Negative.   Musculoskeletal: Negative.   Psychiatric/Behavioral: Negative for behavioral problems and dysphoric mood.       Objective: Filed Vitals:   03/20/16 1011  BP: 109/69  Pulse: 69  Temp: 97.8 F (36.6 C)  TempSrc: Oral  Resp: 16  Height: 5\' 7"  (1.702 m)  Weight: 156 lb 3.2 oz (70.852 kg)  SpO2: 98%      Physical Exam  Constitutional: Patient appears well-developed and well-nourished. No distress. CVS: normal rate, regular rhythm, S1/S2 +, no murmurs, no gallops, no carotid bruit.  Pulmonary: Effort and breath sounds normal, no stridor, rhonchi, wheezes, rales.  Abdominal: Soft. BS +,  Mild abdominal distension, no tenderness, rebound or guarding.  Musculoskeletal: Normal range of motion. No edema and no tenderness.  Lymphadenopathy: No lymphadenopathy noted, cervical, inguinal or axillary Skin: Skin is warm and dry. No rash noted. Not diaphoretic. No erythema. No pallor. Psychiatric: Normal mood and affect. Behavior, judgment, thought content normal      Assessment & Plan:  1. Superior mesenteric vein thrombosis (HCC) INR is therapeutic at 2 No change in regimen, continue 2.5 mg on Wednesday, Friday,Saturday and Sunday and 5 mg on other days. Will remain on anticoagulation till 04/25/16 - POCT INR - warfarin (COUMADIN) 5 MG tablet; 2.5mg  on Wednesdays, Fridays, Saturdays  and Sundays and  on other days.  Dispense: 30 tablet; Refill: 0  2. Alcoholic cirrhosis of liver with ascites (HCC) Asymptomatic at this time - spironolactone (ALDACTONE) 25 MG tablet; Take 1 tablet (25 mg total) by mouth daily.  Dispense: 30 tablet; Refill: 1 - furosemide (LASIX) 40 MG tablet; Take 1 tablet (40 mg total) by mouth 2 (two) times daily.  Dispense: 60 tablet; Refill: 1 - propranolol (INDERAL) 10 MG  tablet; Take 0.5 tablets (5 mg total) by mouth 3 (three) times daily.  Dispense: 90 tablet; Refill: 1    This note has been created with Education officer, environmental. Any transcriptional errors are unintentional.

## 2016-03-20 NOTE — Progress Notes (Signed)
Patient here for follow up from thrombosis. Denies pain. Refill citalopram.  Taken morning meds. Ready to quit smoking.

## 2016-03-31 MED FILL — ?SPIRONOLACTONE 25 MG TABLE: 25 | 30 days supply | Qty: 30 | Fill #1

## 2016-03-31 MED FILL — ?CITALOPRAM HBR 20 MG TABLE: 20 | 30 days supply | Qty: 30 | Fill #0

## 2016-04-01 MED FILL — FUROSEMIDE 40 MG TABLET: 40 | 30 days supply | Qty: 60 | Fill #0

## 2016-04-22 MED FILL — ?CITALOPRAM HBR 20 MG TABLE: 20 | 30 days supply | Qty: 30 | Fill #1

## 2016-04-22 MED FILL — traZODone HCL 100 MG TABS: 100 | 30 days supply | Qty: 30 | Fill #1

## 2016-04-23 ENCOUNTER — Ambulatory Visit: Payer: Self-pay | Attending: Family Medicine | Admitting: Family Medicine

## 2016-04-23 ENCOUNTER — Encounter: Payer: Self-pay | Admitting: Family Medicine

## 2016-04-23 DIAGNOSIS — I8500 Esophageal varices without bleeding: Secondary | ICD-10-CM

## 2016-04-23 DIAGNOSIS — IMO0002 Reserved for concepts with insufficient information to code with codable children: Secondary | ICD-10-CM

## 2016-04-23 DIAGNOSIS — I81 Portal vein thrombosis: Secondary | ICD-10-CM

## 2016-04-23 DIAGNOSIS — K7031 Alcoholic cirrhosis of liver with ascites: Secondary | ICD-10-CM

## 2016-04-23 DIAGNOSIS — G4700 Insomnia, unspecified: Secondary | ICD-10-CM

## 2016-04-23 DIAGNOSIS — K55069 Acute infarction of intestine, part and extent unspecified: Secondary | ICD-10-CM

## 2016-04-23 LAB — POCT INR: INR: 2

## 2016-04-23 MED ORDER — FUROSEMIDE 40 MG PO TABS: 40.0000 mg | ORAL_TABLET | Freq: Two times a day (BID) | ORAL | Status: DC

## 2016-04-23 MED ORDER — TRAZODONE HCL 100 MG PO TABS: 100.0000 mg | ORAL_TABLET | Freq: Every evening | ORAL | Status: DC | PRN

## 2016-04-23 MED ORDER — SPIRONOLACTONE 25 MG PO TABS: 25.0000 mg | ORAL_TABLET | Freq: Every day | ORAL | Status: DC

## 2016-04-23 MED ORDER — PROPRANOLOL HCL 10 MG PO TABS: 5.0000 mg | ORAL_TABLET | Freq: Three times a day (TID) | ORAL | Status: DC

## 2016-04-23 MED ORDER — CITALOPRAM HYDROBROMIDE 20 MG PO TABS: 20.0000 mg | ORAL_TABLET | Freq: Every day | ORAL | Status: DC

## 2016-04-23 NOTE — Progress Notes (Signed)
INR visit Follow up

## 2016-04-23 NOTE — Patient Instructions (Signed)
Cirrhosis °Cirrhosis is long-term (chronic) liver injury. The liver is your largest internal organ, and it performs many functions. The liver converts food into energy, removes toxic material from your blood, makes important proteins, and absorbs necessary vitamins from your diet. °If you have cirrhosis, it means many of your healthy liver cells have been replaced by scar tissue. This prevents blood from flowing through your liver, which makes it difficult for your liver to function. This scarring is not reversible, but treatment can prevent it from getting worse.  °CAUSES  °Hepatitis C and long-term alcohol abuse are the most common causes of cirrhosis. Other causes include: °· Nonalcoholic fatty liver disease. °· Hepatitis B infection. °· Autoimmune hepatitis. °· Diseases that cause blockage of ducts inside the liver. °· Inherited liver diseases. °· Reactions to certain long-term medicines. °· Parasitic infections. °· Long-term exposure to certain toxins. °RISK FACTORS °You may have a higher risk of cirrhosis if you: °· Have certain hepatitis viruses. °· Abuse alcohol, especially if you are male. °· Are overweight. °· Share needles. °· Have unprotected sex with someone who has hepatitis. °SYMPTOMS  °You may not have any signs and symptoms at first. Symptoms may not develop until the damage to your liver starts to get worse. Signs and symptoms of cirrhosis may include:  °· Tenderness in the right-upper part of your abdomen. °· Weakness and tiredness (fatigue). °· Loss of appetite. °· Nausea. °· Weight loss and muscle loss. °· Itchiness. °· Yellow skin and eyes (jaundice). °· Buildup of fluid in the abdomen (ascites). °· Swelling of the feet and ankles (edema). °· Appearance of tiny blood vessels under the skin. °· Mental confusion. °· Easy bruising and bleeding. °DIAGNOSIS  °Your health care provider may suspect cirrhosis based on your symptoms and medical history, especially if you have other medical conditions  or a history of alcohol abuse. Your health care provider will do a physical exam to feel your liver and check for signs of cirrhosis. Your health care provider may perform other tests, including:  °· Blood tests to check:   °¨ Whether you have hepatitis B or C.   °¨ Kidney function. °¨ Liver function. °· Imaging tests such as: °¨ MRI or CT scan to look for changes seen in advanced cirrhosis. °¨ Ultrasound to see if normal liver tissue is being replaced by scar tissue. °· A procedure using a long needle to take a sample of liver tissue (biopsy) for examination under a microscope. Liver biopsy can confirm the diagnosis of cirrhosis.   °TREATMENT  °Treatment depends on how damaged your liver is and what caused the damage. Treatment may include treating cirrhosis symptoms or treating the underlying causes of the condition to try to slow the progression of the damage. Treatment may include: °· Making lifestyle changes, such as:   °¨ Eating a healthy diet. °¨ Restricting salt intake.  °¨ Maintaining a healthy weight.   °¨ Not abusing drugs or alcohol. °· Taking medicines to: °¨ Treat liver infections or other infections. °¨ Control itching. °¨ Reduce fluid buildup. °¨ Reduce certain blood toxins. °¨ Reduce risk of bleeding from enlarged blood vessels in the stomach or esophagus (varices). °· If varices are causing bleeding problems, you may need treatment with a procedure that ties up the vessels causing them to fall off (band ligation). °· If cirrhosis is causing your liver to fail, your health care provider may recommend a liver transplant. °· Other treatments may be recommended depending on any complications of cirrhosis, such as liver-related kidney failure (hepatorenal   syndrome). °HOME CARE INSTRUCTIONS  °· Take medicines only as directed by your health care provider. Do not use drugs that are toxic to your liver. Ask your health care provider before taking any new medicines, including over-the-counter medicines.    °· Rest as needed. °· Eat a well-balanced diet. Ask your health care provider or dietitian for more information.   °· You may have to follow a low-salt diet or restrict your water intake as directed. °· Do not drink alcohol. This is especially important if you are taking acetaminophen. °· Keep all follow-up visits as directed by your health care provider. This is important. °SEEK MEDICAL CARE IF: °· You have fatigue or weakness that is getting worse. °· You develop swelling of the hands, feet, legs, or face. °· You have a fever. °· You develop loss of appetite. °· You have nausea or vomiting. °· You develop jaundice. °· You develop easy bruising or bleeding. °SEEK IMMEDIATE MEDICAL CARE IF: °· You vomit bright red blood or a material that looks like coffee grounds. °· You have blood in your stools. °· Your stools appear black and tarry. °· You become confused. °· You have chest pain or trouble breathing. °  °This information is not intended to replace advice given to you by your health care provider. Make sure you discuss any questions you have with your health care provider. °  °Document Released: 10/13/2005 Document Revised: 11/03/2014 Document Reviewed: 06/21/2014 °Elsevier Interactive Patient Education ©2016 [employer] ° °

## 2016-04-23 NOTE — Progress Notes (Signed)
Subjective:  Patient ID: Kenneth [name], male    DOB: 12/30/1960  Age: 55 y.o. MRN: [id number]  CC: Coagulation Disorder and Follow-up   HPI Kenneth [name] s a 55 year old male with a history of alcoholic cirrhosis of the liver , thrombocytopenia, SMA thrombosis (Dignosed on 01/25/2016 by CT abdomen and pelvis currently on anticoagulation with Coumadin) who comes into the clinic for a follow-up visit. At his last office visit his INR was 2.0 and his dose of Coumadin was continued.  He has completed his 3 month course of anticoagulation with Coumadin and INR is 2.0 today. He noticed a 5 pound weight gain since his last office visit but denies pedal edema and has been compliant with all his other medications.  Insomnia is controlled on trazodone and he is doing well on Celexa. 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  Outpatient Prescriptions Prior to Visit  Medication Sig Dispense Refill  . Multiple Vitamin (MULTIVITAMIN WITH MINERALS) TABS tablet Take 0.5 tablets by mouth daily.    . citalopram (CELEXA) 20 MG tablet Take 1 tablet (20 mg total) by mouth daily. 30 tablet 2  . furosemide (LASIX) 40 MG tablet Take 1 tablet (40 mg total) by mouth 2 (two) times daily. 60 tablet 3  . propranolol (INDERAL) 10 MG tablet Take 0.5 tablets (5 mg total) by mouth 3 (three) times daily. 90 tablet 3  . spironolactone (ALDACTONE) 25 MG tablet Take 1 tablet (25 mg total) by mouth daily. 30 tablet 3  . traZODone (DESYREL) 100 MG tablet Take 1 tablet (100 mg total) by mouth at bedtime as needed for sleep. 30 tablet 2  . warfarin (COUMADIN) 5 MG tablet 2.5mg  on Wednesdays, Fridays, Saturdays and Sundays and 5mg  on other days. 30 tablet 0   No facility-administered medications prior to visit.    ROS Review of Systems  Constitutional: Positive for  unexpected weight change (gained 5 pounds since the last visit). Negative for activity change and appetite change.  HENT: Negative for sinus pressure and sore throat.   Eyes: Negative for visual disturbance.  Respiratory: Negative for cough, chest tightness and shortness of breath.   Cardiovascular: Negative for chest pain and leg swelling.  Gastrointestinal: Negative for abdominal pain, diarrhea, constipation and abdominal distention.  Endocrine: Negative.   Genitourinary: Negative for dysuria.  Musculoskeletal: Negative for myalgias and joint swelling.  Skin: Negative for rash.  Allergic/Immunologic: Negative.   Neurological: Negative for weakness, light-headedness and numbness.  Psychiatric/Behavioral: Negative for suicidal ideas and dysphoric mood.    Objective:  BP 102/69 mmHg  Pulse 67  Temp(Src) 98 F (36.7 C) (Oral)  Ht 5\' 7"  (1.702 m)  Wt 162 lb (73.483 kg)  BMI 25.37 kg/m2  SpO2 96%  BP/Weight 04/23/2016 03/20/2016 03/06/2016  Systolic BP 102 109 99  Diastolic BP 69 69 65  Wt. (Lbs) 162 156.2 157.4  BMI 25.37 24.46 24.65      Physical Exam  Constitutional: He is oriented to person, place, and time. He appears well-developed and well-nourished.  Cardiovascular: Normal rate, normal heart sounds and intact distal pulses.   No murmur heard. Pulmonary/Chest: Effort normal and breath sounds normal. He has no wheezes. He has no rales. He exhibits no tenderness.  Abdominal: Soft. Bowel sounds are normal. He exhibits distension (Mild  ascites) and mass. There is no tenderness. Guarding: Umbilical hernia.  Musculoskeletal: Normal range of motion.  Neurological: He is alert and oriented to person, place, and time.     Assessment & Plan:   1. Esophageal varices without bleeding, unspecified esophageal varices type (HCC) No evidence of bleeding  2. Superior mesenteric vein thrombosis (HCC) Completed 3 month course of anticoagulation with Coumadin Discontinue Coumadin -  INR  3. Alcoholic cirrhosis of liver with ascites [facility](HCC) Patient will still need to see GI but [facility] which I have advised him to expedite. - furosemide (LASIX) 40 MG tablet; Take 1 tablet (40 mg total) by mouth 2 (two) times daily.  Dispense: 60 tablet; Refill: 3 - propranolol (INDERAL) 10 MG tablet; Take 0.5 tablets (5 mg total) by mouth 3 (three) times daily.  Dispense: 90 tablet; Refill: 3 - spironolactone (ALDACTONE) 25 MG tablet; Take 1 tablet (25 mg total) by mouth daily.  Dispense: 30 tablet; Refill: 3 - COMPLETE METABOLIC PANEL WITH GFR  4. Insomnia Doing well on trazodone He will need a sleep study given his previous history of daytime somnolence and the fact that he will need to obtain his QT physical. He has been advised to contact the clinic once he is approved so the referral for sleep study, replaced. - traZODone (DESYREL) 100 MG tablet; Take 1 tablet (100 mg total) by mouth at bedtime as needed for sleep.  Dispense: 30 tablet; Refill: 2   Meds ordered this encounter  Medications  . citalopram (CELEXA) 20 MG tablet    Sig: Take 1 tablet (20 mg total) by mouth daily.    Dispense:  30 tablet    Refill:  3  . furosemide (LASIX) 40 MG tablet    Sig: Take 1 tablet (40 mg total) by mouth 2 (two) times daily.    Dispense:  60 tablet    Refill:  3  . propranolol (INDERAL) 10 MG tablet    Sig: Take 0.5 tablets (5 mg total) by mouth 3 (three) times daily.    Dispense:  90 tablet    Refill:  3  . spironolactone (ALDACTONE) 25 MG tablet    Sig: Take 1 tablet (25 mg total) by mouth daily.    Dispense:  30 tablet    Refill:  3  . traZODone (DESYREL) 100 MG tablet    Sig: Take 1 tablet (100 mg total) by mouth at bedtime as needed for sleep.    Dispense:  30 tablet    Refill:  2    Follow-up: Return in about 3 months (around 07/24/2016) for Follow-up on liver cirrhosis.   Jaclyn ShaggyEnobong Amao MD

## 2016-04-24 LAB — COMPLETE METABOLIC PANEL WITH GFR
ALK PHOS: 87 U/L (ref 40–115)
ALT: 26 U/L (ref 9–46)
AST: 33 U/L (ref 10–35)
Albumin: 4.2 g/dL (ref 3.6–5.1)
BILIRUBIN TOTAL: 0.9 mg/dL (ref 0.2–1.2)
BUN: 10 mg/dL (ref 7–25)
CO2: 25 mmol/L (ref 20–31)
Calcium: 9 mg/dL (ref 8.6–10.3)
Chloride: 101 mmol/L (ref 98–110)
Creat: 0.69 mg/dL — ABNORMAL LOW (ref 0.70–1.33)
GFR, Est African American: >89 mL/min (ref >=60)
Glucose, Bld: 102 mg/dL — ABNORMAL HIGH (ref 65–99)
Potassium: 3.8 mmol/L (ref 3.5–5.3)
SODIUM: 137 mmol/L (ref 135–146)
TOTAL PROTEIN: 7.5 g/dL (ref 6.1–8.1)

## 2016-04-30 ENCOUNTER — Ambulatory Visit: Payer: Self-pay | Attending: Family Medicine

## 2016-05-05 MED FILL — SPIRONOLACTONE 25 MG TABLET: 25 | 30 days supply | Qty: 30 | Fill #2

## 2016-05-05 MED FILL — FUROSEMIDE 40 MG TABLET: 40 | 30 days supply | Qty: 60 | Fill #1

## 2016-05-21 ENCOUNTER — Encounter: Payer: Self-pay | Admitting: Family Medicine

## 2016-05-26 MED FILL — CITALOPRAM HBR 20 MG TABLET: 20 | 30 days supply | Qty: 30 | Fill #2

## 2016-05-26 MED FILL — PROPRANOLOL 10 MG TABLET: 10 | 18 days supply | Qty: 25 | Fill #1

## 2016-05-26 MED FILL — ?TRAZODONE 100 MG TABLET: 100 | 30 days supply | Qty: 30 | Fill #2

## 2016-05-28 ENCOUNTER — Other Ambulatory Visit: Payer: Self-pay | Admitting: Family Medicine

## 2016-05-28 ENCOUNTER — Encounter: Payer: Self-pay | Admitting: Gastroenterology

## 2016-05-28 DIAGNOSIS — K7031 Alcoholic cirrhosis of liver with ascites: Secondary | ICD-10-CM

## 2016-05-28 DIAGNOSIS — G4700 Insomnia, unspecified: Secondary | ICD-10-CM

## 2016-06-09 MED FILL — ?FUROSEMIDE 40 MG TABLET: 40 | 30 days supply | Qty: 60 | Fill #2

## 2016-06-09 MED FILL — ?SPIRONOLACTONE 25 MG TABLE: 25 | 30 days supply | Qty: 30 | Fill #3

## 2016-06-23 MED FILL — PROPRANOLOL 10 MG TABLET: 10 | 60 days supply | Qty: 90 | Fill #0

## 2016-06-23 MED FILL — traZODone HCL 100 MG TABS: 100 | 30 days supply | Qty: 30 | Fill #0

## 2016-06-26 MED FILL — ?CITALOPRAM HBR 20 MG TABLE: 20 | 30 days supply | Qty: 30 | Fill #0

## 2016-07-09 MED FILL — SPIRONOLACTONE 25 MG TABLET: 25 | 30 days supply | Qty: 30 | Fill #0

## 2016-07-09 MED FILL — ?FUROSEMIDE 40 MG TABLET: 40 | 30 days supply | Qty: 60 | Fill #3

## 2016-07-15 ENCOUNTER — Ambulatory Visit (HOSPITAL_BASED_OUTPATIENT_CLINIC_OR_DEPARTMENT_OTHER): Payer: Self-pay | Attending: Family Medicine | Admitting: Internal Medicine

## 2016-07-15 DIAGNOSIS — G4700 Insomnia, unspecified: Secondary | ICD-10-CM | POA: Insufficient documentation

## 2016-07-19 DIAGNOSIS — G4700 Insomnia, unspecified: Secondary | ICD-10-CM

## 2016-07-19 NOTE — Procedures (Signed)
   Patient Name: Kenneth [name], Kenneth [name] Study Date: 07/15/2016 Gender: Male [name].O.B: [birth year] Age (years): [age] Referring Provider: Jaclyn ShaggyEnobong Amao Height (inches): 67 Interpreting Physician: Jetty Duhamellinton Bruinsma MD, ABSM Weight (lbs): 152 RPSGT: Armen PickupFord, Evelyn BMI: 24 MRN: [id number] Neck Size: 13.50 CLINICAL INFORMATION Sleep Study Type: NPSG Indication for sleep study: Excessive Daytime Sleepiness Epworth Sleepiness Score: 17  SLEEP STUDY TECHNIQUE As per the AASM Manual for the Scoring of Sleep and Associated Events v2.3 (April 2016) with a hypopnea requiring 4% desaturations. The channels recorded and monitored were frontal, central and occipital EEG, electrooculogram (EOG), submentalis EMG (chin), nasal and oral airflow, thoracic and abdominal wall motion, anterior tibialis EMG, snore microphone, electrocardiogram, and pulse oximetry.  MEDICATIONS Patient's medications include: charted for review. Medications self-administered by patient during sleep study : Sleep medicine administered - TRAZODONE at 09:30:34 PM  SLEEP ARCHITECTURE The study was initiated at 9:41:27 PM and ended at 3:43:00 AM. Sleep onset time was 25.3 minutes and the sleep efficiency was 91.6%. The total sleep time was 331.0 minutes. Stage REM latency was 129.5 minutes. The patient spent 1.36% of the night in stage N1 sleep, 67.67% in stage N2 sleep, 2.11% in stage N3 and 28.85% in REM. Alpha intrusion was absent. Supine sleep was 69.41%. Wake after sleep onset 5 minutes  RESPIRATORY PARAMETERS The overall apnea/hypopnea index (AHI) was 1.1 per hour. There were 0 total apneas, including 0 obstructive, 0 central and 0 mixed apneas. There were 6 hypopneas and 12 RERAs. The AHI during Stage REM sleep was 3.8 per hour. AHI while supine was 1.6 per hour. The mean oxygen saturation was 90.51%. The minimum SpO2 during sleep was 87.00%. Soft snoring was noted during this study.  CARDIAC DATA The 2 lead EKG demonstrated sinus  rhythm. The mean heart rate was 57.64 beats per minute. Other EKG findings include: None.  LEG MOVEMENT DATA The total PLMS were 76 with a resulting PLMS index of 13.78. Associated arousal with leg movement index was 0.0 .  IMPRESSIONS - No significant obstructive sleep apnea occurred during this study (AHI = 1.1/h). - No significant central sleep apnea occurred during this study (CAI = 0.0/h). - Mild oxygen desaturation was noted during this study (Min O2 = 87.00%). - The patient snored with Soft snoring volume. - Normal sleep architecture. Ptient took trazodone. - No cardiac abnormalities were noted during this study. - Mild periodic limb movements of sleep occurred during the study. No significant associated arousals.  DIAGNOSIS - Normal study  RECOMMENDATIONS - Avoid alcohol, sedatives and other CNS depressants that may worsen sleep apnea and disrupt normal sleep architecture. - Sleep hygiene should be reviewed to assess factors that may improve sleep quality. - Weight management and regular exercise should be initiated or continued if appropriate.  [Electronically signed] 07/19/2016 02:02 PM  Jetty Duhamellinton Stagner MD, ABSM Diplomate, American Board of Sleep Medicine   NPI: [id number]  Kenneth [name],Kenneth [name] Diplomate, American Board of Sleep Medicine  ELECTRONICALLY SIGNED ON:  07/19/2016, 1:59 PM [facility] PH: [phone]   FX: [phone] [id number] ACCREDITED BY THE AMERICAN ACADEMY OF SLEEP MEDICINE

## 2016-07-28 ENCOUNTER — Encounter: Payer: Self-pay | Admitting: Family Medicine

## 2016-07-28 ENCOUNTER — Ambulatory Visit: Payer: Self-pay | Attending: Family Medicine | Admitting: Family Medicine

## 2016-07-28 VITALS — BP 85/54 | HR 57 | Temp 98.0°F | Ht 67.0 in | Wt 159.2 lb

## 2016-07-28 DIAGNOSIS — G4700 Insomnia, unspecified: Secondary | ICD-10-CM | POA: Insufficient documentation

## 2016-07-28 DIAGNOSIS — K7031 Alcoholic cirrhosis of liver with ascites: Secondary | ICD-10-CM

## 2016-07-28 DIAGNOSIS — R110 Nausea: Secondary | ICD-10-CM

## 2016-07-28 DIAGNOSIS — H527 Unspecified disorder of refraction: Secondary | ICD-10-CM

## 2016-07-28 DIAGNOSIS — Z86718 Personal history of other venous thrombosis and embolism: Secondary | ICD-10-CM | POA: Insufficient documentation

## 2016-07-28 DIAGNOSIS — G4709 Other insomnia: Secondary | ICD-10-CM

## 2016-07-28 DIAGNOSIS — Z79899 Other long term (current) drug therapy: Secondary | ICD-10-CM | POA: Insufficient documentation

## 2016-07-28 DIAGNOSIS — I9589 Other hypotension: Secondary | ICD-10-CM

## 2016-07-28 DIAGNOSIS — I959 Hypotension, unspecified: Secondary | ICD-10-CM | POA: Insufficient documentation

## 2016-07-28 DIAGNOSIS — D696 Thrombocytopenia, unspecified: Secondary | ICD-10-CM | POA: Insufficient documentation

## 2016-07-28 DIAGNOSIS — R5383 Other fatigue: Secondary | ICD-10-CM

## 2016-07-28 LAB — COMPLETE METABOLIC PANEL WITH GFR
ALBUMIN: 4.2 g/dL (ref 3.6–5.1)
ALK PHOS: 79 U/L (ref 40–115)
ALT: 23 U/L (ref 9–46)
AST: 32 U/L (ref 10–35)
BUN: 10 mg/dL (ref 7–25)
CALCIUM: 9.4 mg/dL (ref 8.6–10.3)
CO2: 28 mmol/L (ref 20–31)
Chloride: 102 mmol/L (ref 98–110)
Creat: 0.67 mg/dL — ABNORMAL LOW (ref 0.70–1.33)
GFR, Est African American: >89 mL/min (ref >=60)
Glucose, Bld: 69 mg/dL (ref 65–99)
POTASSIUM: 4.2 mmol/L (ref 3.5–5.3)
Sodium: 139 mmol/L (ref 135–146)
Total Bilirubin: 0.8 mg/dL (ref 0.2–1.2)
Total Protein: 7.4 g/dL (ref 6.1–8.1)

## 2016-07-28 LAB — CBC WITH DIFFERENTIAL/PLATELET
BASOS ABS: 59 {cells}/uL (ref 0–200)
Basophils Relative: 1 %
Eosinophils Absolute: 295 cells/uL (ref 15–500)
Eosinophils Relative: 5 %
HEMATOCRIT: 41.2 % (ref 38.5–50.0)
HEMOGLOBIN: 14.4 g/dL (ref 13.2–17.1)
LYMPHS ABS: 944 {cells}/uL (ref 850–3900)
Lymphocytes Relative: 16 %
MCH: 31.6 pg (ref 27.0–33.0)
MCHC: 35 g/dL (ref 32.0–36.0)
MCV: 90.4 fL (ref 80.0–100.0)
MONO ABS: 826 {cells}/uL (ref 200–950)
MPV: 11 fL (ref 7.5–12.5)
Monocytes Relative: 14 %
NEUTROS PCT: 64 %
Neutro Abs: 3776 cells/uL (ref 1500–7800)
Platelets: 80 10*3/uL — ABNORMAL LOW (ref 140–400)
RBC: 4.56 MIL/uL (ref 4.20–5.80)
RDW: 13.7 % (ref 11.0–15.0)
WBC: 5.9 10*3/uL (ref 3.8–10.8)

## 2016-07-28 MED ORDER — ONDANSETRON HCL 4 MG PO TABS: 4.0000 mg | ORAL_TABLET | Freq: Two times a day (BID) | ORAL | 1 refills | Status: DC | PRN

## 2016-07-28 MED ORDER — FUROSEMIDE 40 MG PO TABS: 40.0000 mg | ORAL_TABLET | Freq: Every day | ORAL | 3 refills | Status: DC

## 2016-07-28 MED FILL — ONDANSETRON HCL 4 MG TABLET: 4 | 15 days supply | Qty: 30 | Fill #0

## 2016-07-28 MED FILL — ?CITALOPRAM HBR 20 MG TABLE: 20 | 30 days supply | Qty: 30 | Fill #1

## 2016-07-28 MED FILL — traZODone HCL 100 MG TABS: 100 | 30 days supply | Qty: 30 | Fill #1

## 2016-07-28 NOTE — Progress Notes (Signed)
Subjective:  Patient ID: Kenneth [name], male    DOB: 1960/12/12  Age: 55 y.o. MRN: [id number]  CC: Follow-up (cirrhosis of liver)   HPI Kenneth [name] is [name] 55 year old male with [name] history of alcoholic cirrhosis of the liver , thrombocytopenia, SMA thrombosis (who has completed [name] course of anticoagulation with Coumadin) who comes into the clinic for [name] follow-up visit.  He has been compliant with his spironolactone, Lasix and has maintained abstinence from alcohol. He complains of fatigue which occurs later in the day with associated lightheadedness and lethargy. 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 Denies shortness of breath, chest pains or pedal edema.  Thyroid function from 12/2015 was normal. He also has occasional nausea which he thinks is related to his spironolactone and he used some marijuana reported improvement in symptoms. Also complains "he thinks he is going blind and his glasses do not work"   Past Medical History:  Diagnosis Date  . Alcohol abuse   . Esophageal varices (HCC) 09/02/2014  . [facility]    age 55 or [age]  . Hiatal hernia 09/02/2014  . Polysubstance abuse   . Portal hypertension with esophageal varices (HCC) 09/02/2014  . Portal hypertensive gastropathy 09/02/2014  . Thrombocytopenia (HCC) 09/02/2014  . Upper GI bleed 08/31/2014    Past Surgical History:  Procedure Laterality Date  . ESOPHAGOGASTRODUODENOSCOPY Left 09/01/2014   Procedure: ESOPHAGOGASTRODUODENOSCOPY (EGD);  Surgeon: Charna ElizabethJyothi Mann, MD;  Location: [facility];  Service: Endoscopy;  Laterality: Left;  . EYE SURGERY      Allergies  Allergen Reactions  . Latex Swelling and Rash     Outpatient Medications Prior to Visit  Medication Sig Dispense Refill  . citalopram (CELEXA) 20 MG tablet Take 1 tablet (20 mg total) by mouth daily. 30 tablet 3  . Multiple Vitamin (MULTIVITAMIN WITH MINERALS)  TABS tablet Take 0.5 tablets by mouth daily.    . propranolol (INDERAL) 10 MG tablet Take 0.5 tablets (5 mg total) by mouth 3 (three) times daily. 90 tablet 3  . spironolactone (ALDACTONE) 25 MG tablet Take 1 tablet (25 mg total) by mouth daily. 30 tablet 3  . traZODone (DESYREL) 100 MG tablet Take 1 tablet (100 mg total) by mouth at bedtime as needed for sleep. 30 tablet 2  . furosemide (LASIX) 40 MG tablet Take 1 tablet (40 mg total) by mouth 2 (two) times daily. 60 tablet 3   No facility-administered medications prior to visit.     ROS Review of Systems  Constitutional: Positive for fatigue. Negative for activity change and appetite change.  HENT: Negative for sinus pressure and sore throat.   Eyes: Positive for visual disturbance.  Respiratory: Negative for cough, chest tightness and shortness of breath.   Cardiovascular: Negative for chest pain and leg swelling.  Gastrointestinal: Positive for nausea. Negative for abdominal distention, abdominal pain, constipation and diarrhea.  Endocrine: Negative.   Genitourinary: Negative for dysuria.  Musculoskeletal: Negative for joint swelling and myalgias.  Skin: Negative for rash.  Allergic/Immunologic: Negative.   Neurological: Positive for dizziness. Negative for weakness, light-headedness and numbness.  Psychiatric/Behavioral: Negative for dysphoric mood and suicidal ideas.    Objective:  BP (!) 85/54 (BP Location: Right Arm, Patient Position: Sitting, Cuff Size: Large)   Pulse (!) 57   Temp 98 F (36.7 C) (Oral)   Ht 5\' 7"  (1.702 m)  Wt 159 lb 3.2 oz (72.2 kg)   SpO2 98%   BMI 24.93 kg/m   BP/Weight 07/28/2016 07/15/2016 04/23/2016  Systolic BP 85 - 161  Diastolic BP 54 - 69  Wt. (Lbs) 159.2 152 162  BMI 24.93 23.81 25.37      Physical Exam Constitutional: He is oriented to person, place, and time. He appears well-developed and well-nourished.  Cardiovascular: Bradycardic rate, normal heart sounds and intact distal  pulses.   No murmur heard. Pulmonary/Chest: Effort normal and breath sounds normal. He has no wheezes. He has no rales. He exhibits no tenderness.  Abdominal: Soft. Bowel sounds are normal. He exhibits distension (Mild ascites)  There is no tenderness. Musculoskeletal: Normal range of motion.  Neurological: He is alert and oriented to person, place, and time.    Assessment & Plan:   1. Alcoholic cirrhosis of liver with ascites (HCC) Stable - furosemide (LASIX) 40 MG tablet; Take 1 tablet (40 mg total) by mouth daily.  Dispense: 60 tablet; Refill: 3 - COMPLETE METABOLIC PANEL WITH GFR  2. Other insomnia Controlled  3. Refractive error Poor vision I have educated him about community resources for eye exams as he has no medical cough which to be seen by an ophthalmologist  4. Nausea without vomiting Placed and Zofran  5. Other fatigue Could be multifactorial: Due to the fact that his job is demanding and very physical, his age and the fact that he has to do heavy lifting - Vitamin D, 25-hydroxy - Testosterone Total,Free,Bio, Males - CBC with Differential/Platelet  6. Other specified hypotension Reduced dose of Lasix from 40 mg twice daily to 40 mg once daily We'll reassess at next visit for improvement   Meds ordered this encounter  Medications  . ondansetron (ZOFRAN) 4 MG tablet    Sig: Take 1 tablet (4 mg total) by mouth 2 (two) times daily as needed for nausea or vomiting.    Dispense:  30 tablet    Refill:  1  . furosemide (LASIX) 40 MG tablet    Sig: Take 1 tablet (40 mg total) by mouth daily.    Dispense:  60 tablet    Refill:  3    Discontinue previous dose    Follow-up: Return in about 2 weeks (around 08/11/2016) for Follow-up on hypotension.   Jaclyn Shaggy MD

## 2016-07-28 NOTE — Progress Notes (Signed)
BP very low- lethargic and light headed

## 2016-07-29 LAB — VITAMIN D 25 HYDROXY (VIT D DEFICIENCY, FRACTURES): VIT D 25 HYDROXY: 41 ng/mL (ref 30–100)

## 2016-07-29 LAB — TESTOSTERONE TOTAL,FREE,BIO, MALES
ALBUMIN: 4.2 g/dL (ref 3.6–5.1)
SEX HORMONE BINDING: 66 nmol/L — AB (ref 10–50)
Testosterone, Bioavailable: 46 ng/dL — ABNORMAL LOW (ref 110.0–575.0)
Testosterone, Free: 23.9 pg/mL — ABNORMAL LOW (ref 46.0–224.0)
Testosterone: 337 ng/dL (ref 250–827)

## 2016-08-01 ENCOUNTER — Ambulatory Visit (INDEPENDENT_AMBULATORY_CARE_PROVIDER_SITE_OTHER): Payer: Self-pay | Admitting: Gastroenterology

## 2016-08-01 ENCOUNTER — Encounter: Payer: Self-pay | Admitting: Gastroenterology

## 2016-08-01 ENCOUNTER — Other Ambulatory Visit (INDEPENDENT_AMBULATORY_CARE_PROVIDER_SITE_OTHER): Payer: Self-pay

## 2016-08-01 VITALS — BP 90/64 | HR 64 | Ht 66.75 in | Wt 159.4 lb

## 2016-08-01 DIAGNOSIS — K766 Portal hypertension: Secondary | ICD-10-CM

## 2016-08-01 DIAGNOSIS — K7031 Alcoholic cirrhosis of liver with ascites: Secondary | ICD-10-CM

## 2016-08-01 DIAGNOSIS — I8500 Esophageal varices without bleeding: Secondary | ICD-10-CM

## 2016-08-01 DIAGNOSIS — D696 Thrombocytopenia, unspecified: Secondary | ICD-10-CM

## 2016-08-01 DIAGNOSIS — Z23 Encounter for immunization: Secondary | ICD-10-CM

## 2016-08-01 LAB — PROTIME-INR
INR: 1.4 ratio — ABNORMAL HIGH (ref 0.8–1.0)
Prothrombin Time: 15.2 s — ABNORMAL HIGH (ref 9.6–13.1)

## 2016-08-01 NOTE — Progress Notes (Signed)
[facility] Consult Note:  History: Kenneth [name] 08/01/2016  Referring physician: Jaclyn Shaggy, MD  Reason for consult/chief complaint: Cirrhosis and esophageal varices   Subjective  HPI:  Kenneth [name] was referred to [place] for alcoholic cirrhosis and a history of esophageal varices. 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 He does not seem to recall very much about his liver condition, and I didn't extensive chart review of hospitalizations in 2015 and 2017. In 2015 he had a variceal bleed requiring banding by Dr. Loreta Ave, and it appears he was put on beta blocker prophylaxis afterwards. He has had no further upper endoscopies performed. He continued to drink alcohol heavily until March 2017, when he was admitted with abdominal pain and vomiting and found to have an SMV thrombosis causing edema of right sided small bowel loops. It was noted GI bleeding at that time. He was managed conservatively with bowel rest and antibiotics, and it seems he was also treated 3 months of Coumadin. He has had no further GI bleeding since the 2015 episode, he denies episodes of confusion or gait unsteadiness or forgetfulness. As near as I can tell, he has not had any documented episodes of hepatic encephalopathy. Thankfully, he also has been abstinent from alcohol since the March 2017 admission.   Review of Systems  Constitutional: Negative for appetite change and unexpected weight change.  HENT: Negative for mouth sores and voice change.   Eyes: Negative for pain and redness.  Respiratory: Negative for cough and shortness of breath.   Cardiovascular: Negative for chest pain and palpitations.  Genitourinary: Negative for dysuria and hematuria.  Musculoskeletal: Negative for arthralgias and myalgias.  Skin: Negative for pallor and rash.  Neurological: Negative for weakness and headaches.  Hematological: Negative  for adenopathy.     Past Medical History: Past Medical History:  Diagnosis Date  . Alcohol abuse   . Anxiety   . Blood clot in vein   . Cirrhosis (HCC)   . Depression   . Esophageal varices (HCC) 09/02/2014  . H/O [facility] spotted fever    age 55 or 55  . Hiatal hernia 09/02/2014  . Polysubstance abuse   . Portal hypertension with esophageal varices (HCC) 09/02/2014  . Portal hypertensive gastropathy 09/02/2014  . Thrombocytopenia (HCC) 09/02/2014  . Upper GI bleed 08/31/2014     Past Surgical History: Past Surgical History:  Procedure Laterality Date  . ESOPHAGOGASTRODUODENOSCOPY Left 09/01/2014   Procedure: ESOPHAGOGASTRODUODENOSCOPY (EGD);  Surgeon: Charna Elizabeth, MD;  Location: [facility];  Service: Endoscopy;  Laterality: Left;  . EYE SURGERY Left    age 55     Family History: Family History  Problem Relation Age of Onset  . Leukemia Father     acute myloid   . Hyperlipidemia Father   . Hypertension Father     Social History: Social History   Social History  . Marital status: Single    Spouse name: N/A  . Number of children: 1  . Years of education: N/A   Occupational History  . landscaper    Social History Main Topics  . Smoking status: Current Every Day Smoker    Packs/day: 0.50    Years: 2.00    Types: Cigarettes  . Smokeless tobacco: Never Used  . Alcohol use No     Comment: no alcohol since 01/23/16  . Drug use:     Types: Marijuana  Comment: last night/ usually nightly  . Sexual activity: Not Asked   Other Topics Concern  . None   Social History Narrative  . None    Allergies: Allergies  Allergen Reactions  . Latex Swelling and Rash    Outpatient Meds: Current Outpatient Prescriptions  Medication Sig Dispense Refill  . citalopram (CELEXA) 20 MG tablet Take 1 tablet (20 mg total) by mouth daily. 30 tablet 3  . furosemide (LASIX) 40 MG tablet Take 1 tablet (40 mg total) by mouth daily. 60 tablet 3  . Multiple Vitamin  (MULTIVITAMIN WITH MINERALS) TABS tablet Take 0.5 tablets by mouth as needed.     . ondansetron (ZOFRAN) 4 MG tablet Take 1 tablet (4 mg total) by mouth 2 (two) times daily as needed for nausea or vomiting. 30 tablet 1  . propranolol (INDERAL) 10 MG tablet Take 0.5 tablets (5 mg total) by mouth 3 (three) times daily. 90 tablet 3  . spironolactone (ALDACTONE) 25 MG tablet Take 1 tablet (25 mg total) by mouth daily. 30 tablet 3  . traZODone (DESYREL) 100 MG tablet Take 1 tablet (100 mg total) by mouth at bedtime as needed for sleep. 30 tablet 2   No current facility-administered medications for this visit.       ___________________________________________________________________ Objective   Exam:  BP 90/64 (BP Location: Left Arm, Patient Position: Sitting, Cuff Size: Normal)   Pulse 64   Ht 5' 6.75" (1.695 m) Comment: height measured without shoes  Wt 159 lb 6 oz (72.3 kg)   BMI 25.15 kg/m    General: this is a(n) Chronically ill-appearing middle-aged man who is alert, conversational and appropriate with fluent speech and a steady gait.   Eyes: sclera anicteric, no redness  ENT: oral mucosa moist without lesions, no cervical or supraclavicular lymphadenopathy, good dentition  CV: RRR without murmur, S1/S2, no JVD, no peripheral edema  Resp: clear to auscultation bilaterally, normal RR and effort noted  GI: no tenderness, with active bowel sounds. The left lobe of the liver is markedly enlarged and felt well below the costal margin and across the midline. No spleen tip is palpable. He has mildly bulging flanks, no shifting dullness and his abdomen is soft. No caput medusae.  Skin; warm and dry, no rash or jaundice noted  Neuro: awake, alert and oriented x 3. Normal gross motor function and fluent speech, no asterixis  Labs:  CMP Latest Ref Rng & Units 07/28/2016 04/23/2016 01/28/2016  Glucose 65 - 99 mg/dL 69 409(W102(H) 119(J108(H)  BUN 7 - 25 mg/dL 10 10 <4(N<5(L)  Creatinine 0.70 - 1.33  mg/dL 8.29(F0.67(L) 6.21(H0.69(L) 0.86(V0.50(L)  Sodium 135 - 146 mmol/L 139 137 139  Potassium 3.5 - 5.3 mmol/L 4.2 3.8 3.4(L)  Chloride 98 - 110 mmol/L 102 101 109  CO2 20 - 31 mmol/L 28 25 22   Calcium 8.6 - 10.3 mg/dL 9.4 9.0 7.8(I8.5(L)  Total Protein 6.1 - 8.1 g/dL 7.4 7.5 -  Total Bilirubin 0.2 - 1.2 mg/dL 0.8 0.9 -  Alkaline Phos 40 - 115 U/L 79 87 -  AST 10 - 35 U/L 32 33 -  ALT 9 - 46 U/L 23 26 -   CBC    Component Value Date/Time   WBC 5.9 07/28/2016 1626   RBC 4.56 07/28/2016 1626   HGB 14.4 07/28/2016 1626   HCT 41.2 07/28/2016 1626   PLT 80 (L) 07/28/2016 1626   MCV 90.4 07/28/2016 1626   MCV 93.7 01/15/2012 1842   MCH 31.6 07/28/2016  1626   MCHC 35.0 07/28/2016 1626   RDW 13.7 07/28/2016 1626   LYMPHSABS 944 07/28/2016 1626   MONOABS 826 07/28/2016 1626   EOSABS 295 07/28/2016 1626   BASOSABS 59 07/28/2016 1626   04/23/16: INR 2.0, AFP 3.8 3/17: Acute Hep panel neg  Radiologic Studies:  See CTAp 3/17 results.  Images personally reviewed.  Ascites, SMV thrombosis, SB edema, gallstones, cirrhosis, splenomegaly No imaging since then  Assessment: Encounter Diagnoses  Name Primary?  . Alcoholic cirrhosis of liver with ascites (HCC)   . Esophageal varices without bleeding, unspecified esophageal varices type (HCC) Yes  . Portal hypertension with esophageal varices (HCC)   . Thrombocytopenia (HCC)     55 year old man with compensated alcohol-related cirrhosis and a history of esophageal variceal bleeding, controlled ascites, and thrombocytopenia from splenomegaly. He is on appropriate beta blocker therapy, and certainly cannot tolerate any higher dose due to his hypotension. He also appears to be on appropriate diuretic doses, and I also would not increase the dose given his hypotension. Discussed the need for 2 g daily sodium restriction He received a Pneumovax during his March admission, and we gave him a Twinrix today. The remainder that series will follow on subsequent visits. I  made him aware of the need for her cancer screening, and scheduled an ultrasound and alpha-fetoprotein. And INR has been drawn to update his MELD score. He does not appear to be currently in need of a liver transplant evaluation. There is no current evidence of hepatic encephalopathy.  I do not know what the requirements are for up-to-date evaluation or reported stability of liver disease to qualify for a commercial driver's license. I have told him I would be glad to make my office consult note from today available to the DOT upon formal request. See me in 3 months or sooner as needed.  Thank you for the courtesy of this consult.  Please call me with any questions or concerns.  Charlie Pitter III  CC: Jaclyn Shaggy, MD

## 2016-08-01 NOTE — Patient Instructions (Addendum)
If you are age 55 or older, your body mass index should be between 23-30. Your Body mass index is 25.15 kg/m. If this is out of the aforementioned range listed, please consider follow up with your Primary Care Provider.  If you are age 55 or younger, your body mass index should be between 19-25. Your Body mass index is 25.15 kg/m. If this is out of the aformentioned range listed, please consider follow up with your Primary Care Provider.   Your physician has requested that you go to the basement for lab work before leaving today.  [facility]1st floor of hospital) on 08-08-2016 at 8am. Please arrive 15 minutes prior to your appointment for registration. Make certain not to have anything to eat or drink 6 hours prior to your appointment. Should you need to reschedule your appointment, please contact radiology at [phone]. This test typically takes about 30 minutes to perform.  We have given you a Hep A/Hep B (twinrix) injection today. Please come back in 30 days for your next injection.  Max sodium intake of 2,000mg  a day, please read information given on low sodium diet.  Thank you for choosing [facility]  Dr Amada JupiterHenry Danis III

## 2016-08-02 LAB — AFP TUMOR MARKER: AFP-Tumor Marker: 3 ng/mL (ref <6.1)

## 2016-08-08 ENCOUNTER — Ambulatory Visit (HOSPITAL_COMMUNITY): Payer: Self-pay

## 2016-08-11 ENCOUNTER — Ambulatory Visit: Payer: Self-pay | Attending: Family Medicine | Admitting: Family Medicine

## 2016-08-11 ENCOUNTER — Encounter: Payer: Self-pay | Admitting: Family Medicine

## 2016-08-11 VITALS — BP 96/62 | HR 51 | Temp 97.8°F | Ht 67.0 in | Wt 157.4 lb

## 2016-08-11 DIAGNOSIS — D696 Thrombocytopenia, unspecified: Secondary | ICD-10-CM | POA: Insufficient documentation

## 2016-08-11 DIAGNOSIS — F10988 Alcohol use, unspecified with other alcohol-induced disorder: Secondary | ICD-10-CM | POA: Insufficient documentation

## 2016-08-11 DIAGNOSIS — E291 Testicular hypofunction: Secondary | ICD-10-CM

## 2016-08-11 DIAGNOSIS — K7031 Alcoholic cirrhosis of liver with ascites: Secondary | ICD-10-CM | POA: Insufficient documentation

## 2016-08-11 NOTE — Patient Instructions (Signed)

## 2016-08-11 NOTE — Progress Notes (Signed)
Subjective:  Patient ID: Kenneth [name], male    DOB: 06/01/1961  Age: 55 y.o. MRN: [id number]  CC: alcoholic cirrhosis of liver   HPI Kenneth [name] is a 55 year old male with a history of alcoholic cirrhosis of the liver , thrombocytopenia, SMA thrombosis (who has completed a course of anticoagulation with Coumadin) who comes into the clinic for a follow-up visit. He had complained of fatigue and labs at his last visit revealed testosterone deficiency. He was also seen by GI last week and a liver ultrasound was ordered but no other changes in management.  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  Past Medical History:  Diagnosis Date  . Alcohol abuse   . Anxiety   . Blood clot in vein   . Cirrhosis (HCC)   . Depression   . Esophageal varices (HCC) 09/02/2014  . H/O [facility] spotted fever    age 48 or 64  . Hiatal hernia 09/02/2014  . Polysubstance abuse   . Portal hypertension with esophageal varices (HCC) 09/02/2014  . Portal hypertensive gastropathy 09/02/2014  . Thrombocytopenia (HCC) 09/02/2014  . Upper GI bleed 08/31/2014    Past Surgical History:  Procedure Laterality Date  . ESOPHAGOGASTRODUODENOSCOPY Left 09/01/2014   Procedure: ESOPHAGOGASTRODUODENOSCOPY (EGD);  Surgeon: Charna Elizabeth, MD;  Location: [facility];  Service: Endoscopy;  Laterality: Left;  . EYE SURGERY Left    age 61    Allergies  Allergen Reactions  . Latex Swelling and Rash     Outpatient Medications Prior to Visit  Medication Sig Dispense Refill  . citalopram (CELEXA) 20 MG tablet Take 1 tablet (20 mg total) by mouth daily. 30 tablet 3  . furosemide (LASIX) 40 MG tablet Take 1 tablet (40 mg total) by mouth daily. 60 tablet 3  . Multiple Vitamin (MULTIVITAMIN WITH MINERALS) TABS tablet Take 0.5 tablets by mouth as needed.     . ondansetron (ZOFRAN) 4 MG tablet Take 1 tablet (4 mg total)  by mouth 2 (two) times daily as needed for nausea or vomiting. 30 tablet 1  . propranolol (INDERAL) 10 MG tablet Take 0.5 tablets (5 mg total) by mouth 3 (three) times daily. 90 tablet 3  . spironolactone (ALDACTONE) 25 MG tablet Take 1 tablet (25 mg total) by mouth daily. 30 tablet 3  . traZODone (DESYREL) 100 MG tablet Take 1 tablet (100 mg total) by mouth at bedtime as needed for sleep. 30 tablet 2   No facility-administered medications prior to visit.     ROS Review of Systems Constitutional: Positive for fatigue. Negative for activity change and appetite change.  HENT: Negative for sinus pressure and sore throat.   Eyes: Positive for visual disturbance.  Respiratory: Negative for cough, chest tightness and shortness of breath.   Cardiovascular: Negative for chest pain and leg swelling.  Gastrointestinal: Positive for nausea. Negative for abdominal distention, abdominal pain, constipation and diarrhea.  Endocrine: Negative.   Genitourinary: Negative for dysuria.  Musculoskeletal: Negative for joint swelling and myalgias.  Skin: Negative for rash.  Allergic/Immunologic: Negative.   Neurological: Positive for dizziness. Negative for weakness, light-headedness and numbness.  Psychiatric/Behavioral: Negative for dysphoric mood and suicidal ideas.   Objective:  BP 96/62 (BP Location: Right Arm, Patient Position: Sitting, Cuff Size: Large)   Pulse (!) 51   Temp 97.8 F (36.6 C) (Oral)   Ht 5\' 7"  (  1.702 m)   Wt 157 lb 6.4 oz (71.4 kg)   SpO2 (!) 51%   BMI 24.65 kg/m   BP/Weight 08/11/2016 08/01/2016 07/28/2016  Systolic BP 96 90 85  Diastolic BP 62 64 54  Wt. (Lbs) 157.4 159.38 159.2  BMI 24.65 25.15 24.93      Physical Exam Constitutional: He is oriented to person, place, and time. He appears well-developed and well-nourished.  Cardiovascular: Bradycardic rate, normal heart sounds and intact distal pulses.   No murmur heard. Pulmonary/Chest: Effort normal and breath sounds  normal. He has no wheezes. He has no rales. He exhibits no tenderness.  Abdominal: Soft. Bowel sounds are normal. He exhibits distension (Mild ascites)  There is no tenderness. Musculoskeletal: Normal range of motion.  Neurological: He is alert and oriented to person, place, and time.    Assessment & Plan:   1. Alcoholic cirrhosis of liver with ascites (HCC) Stable Continue spironolactone, Lasix, propranolol  2. Thrombocytopenia (HCC) Stable  3. Testosterone deficiency in male We have discussed the risk and benefits of testosterone replacement therapy and he would like to hold off on replacement for now.  I have provided him with a letter and a copy of his sleep study report as requested   No orders of the defined types were placed in this encounter.   Follow-up: Return in about 3 months (around 11/11/2016) for Follow-up on liver cirrhosis.   Jaclyn ShaggyEnobong Amao MD

## 2016-08-14 ENCOUNTER — Ambulatory Visit (HOSPITAL_COMMUNITY): Admission: RE | Admit: 2016-08-14 | Payer: Self-pay | Source: Ambulatory Visit

## 2016-08-28 MED FILL — ?SPIRONOLACTONE 25 MG TABLE: 25 MG | 30 days supply | Qty: 30 | Fill #1

## 2016-08-28 MED FILL — ?CITALOPRAM HBR 20 MG TABLE: 20 | 30 days supply | Qty: 30 | Fill #2

## 2016-08-28 MED FILL — traZODone HCL 100 MG TABS: 100 | 30 days supply | Qty: 30 | Fill #2

## 2016-09-02 ENCOUNTER — Ambulatory Visit (INDEPENDENT_AMBULATORY_CARE_PROVIDER_SITE_OTHER): Payer: Self-pay | Admitting: Gastroenterology

## 2016-09-02 DIAGNOSIS — Z23 Encounter for immunization: Secondary | ICD-10-CM

## 2016-09-02 DIAGNOSIS — K7469 Other cirrhosis of liver: Secondary | ICD-10-CM

## 2016-09-29 ENCOUNTER — Other Ambulatory Visit: Payer: Self-pay | Admitting: Family Medicine

## 2016-09-29 DIAGNOSIS — G4700 Insomnia, unspecified: Secondary | ICD-10-CM

## 2016-09-29 DIAGNOSIS — K7031 Alcoholic cirrhosis of liver with ascites: Secondary | ICD-10-CM

## 2016-09-29 MED FILL — FUROSEMIDE 40 MG TABLET: 40 | 30 days supply | Qty: 30 | Fill #0

## 2016-09-29 MED FILL — ?SPIRONOLACTONE 25 MG TABLE: 25 MG | 30 days supply | Qty: 30 | Fill #2

## 2016-09-29 MED FILL — PROPRANOLOL 10 MG TABLET: 10 | 60 days supply | Qty: 90 | Fill #1

## 2016-09-29 MED FILL — CITALOPRAM HBR 20 MG TABLET: 20 | 30 days supply | Qty: 30 | Fill #3

## 2016-09-29 MED FILL — traZODone HCL 100 MG TABS: 100 | 30 days supply | Qty: 30 | Fill #0

## 2016-10-29 ENCOUNTER — Other Ambulatory Visit: Payer: Self-pay | Admitting: Family Medicine

## 2016-10-29 MED FILL — CITALOPRAM HBR 20 MG TABLET: 20 | 30 days supply | Qty: 30 | Fill #0

## 2016-10-29 MED FILL — traZODone HCL 100 MG TABS: 100 | 30 days supply | Qty: 30 | Fill #1

## 2016-10-29 MED FILL — FUROSEMIDE 40 MG TABLET: 40 | 30 days supply | Qty: 30 | Fill #1

## 2016-10-29 MED FILL — ?SPIRONOLACTONE 25 MG TABLE: 25 MG | 30 days supply | Qty: 30 | Fill #3

## 2016-10-29 MED FILL — ONDANSETRON HCL 4 MG TABLET: 4 | 15 days supply | Qty: 30 | Fill #1

## 2016-11-27 ENCOUNTER — Other Ambulatory Visit: Payer: Self-pay | Admitting: Family Medicine

## 2016-11-27 DIAGNOSIS — K7031 Alcoholic cirrhosis of liver with ascites: Secondary | ICD-10-CM

## 2016-11-27 MED FILL — SPIRONOLACTONE 25 MG TABLET: 25 | 30 days supply | Qty: 30 | Fill #0

## 2016-11-27 MED FILL — ONDANSETRON HCL 4 MG TABLET: 4 | 15 days supply | Qty: 30 | Fill #0

## 2016-11-27 MED FILL — ?CITALOPRAM HBR 20 MG TABLE: 20 | 30 days supply | Qty: 30 | Fill #1

## 2016-11-27 MED FILL — ?FUROSEMIDE 40 MG TABLET: 40 | 30 days supply | Qty: 30 | Fill #2

## 2016-11-27 MED FILL — ?PROPRANOLOL 10 MG TABLET: 10 | 60 days supply | Qty: 90 | Fill #2

## 2016-11-27 MED FILL — traZODone HCL 100 MG TABS: 100 | 30 days supply | Qty: 30 | Fill #2

## 2016-12-09 ENCOUNTER — Ambulatory Visit: Payer: Self-pay | Admitting: Family Medicine

## 2016-12-18 ENCOUNTER — Encounter: Payer: Self-pay | Admitting: Family Medicine

## 2016-12-18 ENCOUNTER — Ambulatory Visit: Payer: Self-pay | Attending: Family Medicine | Admitting: Family Medicine

## 2016-12-18 VITALS — BP 93/59 | HR 57 | Temp 97.3°F | Ht 67.0 in | Wt 157.4 lb

## 2016-12-18 DIAGNOSIS — G4700 Insomnia, unspecified: Secondary | ICD-10-CM | POA: Insufficient documentation

## 2016-12-18 DIAGNOSIS — F325 Major depressive disorder, single episode, in full remission: Secondary | ICD-10-CM

## 2016-12-18 DIAGNOSIS — G4709 Other insomnia: Secondary | ICD-10-CM

## 2016-12-18 DIAGNOSIS — D696 Thrombocytopenia, unspecified: Secondary | ICD-10-CM | POA: Insufficient documentation

## 2016-12-18 DIAGNOSIS — F329 Major depressive disorder, single episode, unspecified: Secondary | ICD-10-CM | POA: Insufficient documentation

## 2016-12-18 DIAGNOSIS — F32A Depression, unspecified: Secondary | ICD-10-CM | POA: Insufficient documentation

## 2016-12-18 DIAGNOSIS — Z79899 Other long term (current) drug therapy: Secondary | ICD-10-CM | POA: Insufficient documentation

## 2016-12-18 DIAGNOSIS — K7031 Alcoholic cirrhosis of liver with ascites: Secondary | ICD-10-CM

## 2016-12-18 DIAGNOSIS — N62 Hypertrophy of breast: Secondary | ICD-10-CM

## 2016-12-18 LAB — COMPLETE METABOLIC PANEL WITH GFR
ALBUMIN: 3.9 g/dL (ref 3.6–5.1)
ALK PHOS: 75 U/L (ref 40–115)
ALT: 20 U/L (ref 9–46)
AST: 32 U/L (ref 10–35)
BILIRUBIN TOTAL: 0.8 mg/dL (ref 0.2–1.2)
BUN: 9 mg/dL (ref 7–25)
CALCIUM: 8.9 mg/dL (ref 8.6–10.3)
CHLORIDE: 107 mmol/L (ref 98–110)
CO2: 24 mmol/L (ref 20–31)
CREATININE: 0.7 mg/dL (ref 0.70–1.33)
GFR, Est Non African American: >89 mL/min (ref >=60)
Glucose, Bld: 87 mg/dL (ref 65–99)
Potassium: 4.4 mmol/L (ref 3.5–5.3)
Sodium: 140 mmol/L (ref 135–146)
TOTAL PROTEIN: 7.3 g/dL (ref 6.1–8.1)

## 2016-12-18 LAB — CBC WITH DIFFERENTIAL/PLATELET
BASOS PCT: 1 %
Basophils Absolute: 43 cells/uL (ref 0–200)
EOS PCT: 3 %
Eosinophils Absolute: 129 cells/uL (ref 15–500)
HCT: 41.1 % (ref 38.5–50.0)
Hemoglobin: 13.9 g/dL (ref 13.2–17.1)
Lymphocytes Relative: 14 %
Lymphs Abs: 602 cells/uL — ABNORMAL LOW (ref 850–3900)
MCH: 31 pg (ref 27.0–33.0)
MCHC: 33.8 g/dL (ref 32.0–36.0)
MCV: 91.5 fL (ref 80.0–100.0)
MONOS PCT: 18 %
MPV: 10.8 fL (ref 7.5–12.5)
Monocytes Absolute: 774 cells/uL (ref 200–950)
NEUTROS PCT: 64 %
Neutro Abs: 2752 cells/uL (ref 1500–7800)
PLATELETS: 79 10*3/uL — AB (ref 140–400)
RBC: 4.49 MIL/uL (ref 4.20–5.80)
RDW: 14.3 % (ref 11.0–15.0)
WBC: 4.3 10*3/uL (ref 3.8–10.8)

## 2016-12-18 MED ORDER — TRAZODONE HCL 100 MG PO TABS
100.0000 mg | ORAL_TABLET | Freq: Every evening | ORAL | 5 refills | Status: DC | PRN
Start: 2016-12-18 — End: 2017-05-21

## 2016-12-18 MED ORDER — SPIRONOLACTONE 25 MG PO TABS: 25.0000 mg | ORAL_TABLET | Freq: Every day | ORAL | 5 refills | Status: DC

## 2016-12-18 MED ORDER — PROPRANOLOL HCL 10 MG PO TABS
5.0000 mg | ORAL_TABLET | Freq: Three times a day (TID) | ORAL | 3 refills | Status: DC
Start: 2016-12-18 — End: 2017-03-17

## 2016-12-18 MED ORDER — FUROSEMIDE 40 MG PO TABS: 40.0000 mg | ORAL_TABLET | Freq: Two times a day (BID) | ORAL | 5 refills | Status: DC

## 2016-12-18 MED ORDER — CITALOPRAM HYDROBROMIDE 20 MG PO TABS: 20.0000 mg | ORAL_TABLET | Freq: Every day | ORAL | 5 refills | Status: DC

## 2016-12-18 NOTE — Progress Notes (Signed)
Subjective:  Patient ID: Kenneth [name], male    DOB: [birth year]  Age: 56 y.o. MRN: [id number]  CC: alcoholic cirrhosis of liver   HPI Kenneth [name] is a 56 year old male with a history of alcoholic cirrhosis of the liver , thrombocytopenia, previous history of SMA thrombosis (Completed course of anticoagulation) here for a follow up visit.  He has been compliant with all his medications and Denies shortness of breath, pedal edema, and endorses abdominal swelling which is not worse compared to his previous visit. He is unsure if this is fluid or if he eats too much.  He complains of tenderness of his nipples which he has noted lately (unable to give exact duration) but has not felt any breast masses or lumps. His last visit to GI was on 08/01/16 and an abdominal ultrasound was ordered which he never went for.  Past Medical History:  Diagnosis Date  . Alcohol abuse   . Anxiety   . Blood clot in vein   . Cirrhosis (HCC)   . Depression   . Esophageal varices (HCC) 09/02/2014  . H/O [facility] spotted fever    age 22 or 20  . Hiatal hernia 09/02/2014  . Polysubstance abuse   . Portal hypertension with esophageal varices (HCC) 09/02/2014  . Portal hypertensive gastropathy 09/02/2014  . Thrombocytopenia (HCC) 09/02/2014  . Upper GI bleed 08/31/2014    Past Surgical History:  Procedure Laterality Date  . ESOPHAGOGASTRODUODENOSCOPY Left 09/01/2014   Procedure: ESOPHAGOGASTRODUODENOSCOPY (EGD);  Surgeon: Charna Elizabeth, MD;  Location: [facility];  Service: Endoscopy;  Laterality: Left;  . EYE SURGERY Left    age 19    Allergies  Allergen Reactions  . Latex Swelling and Rash     Outpatient Medications Prior to Visit  Medication Sig Dispense Refill  . ondansetron (ZOFRAN) 4 MG tablet TAKE 1 TABLET BY MOUTH 2 TIMES DAILY AS NEEDED FOR NAUSEA OR VOMITING. 30 tablet 0  . citalopram (CELEXA) 20 MG tablet TAKE 1 TABLET BY MOUTH DAILY. 30 tablet 3  . furosemide (LASIX) 40  MG tablet TAKE 1 TABLET BY MOUTH TWICE DAILY. 60 tablet 3  . propranolol (INDERAL) 10 MG tablet Take 0.5 tablets (5 mg total) by mouth 3 (three) times daily. 90 tablet 3  . spironolactone (ALDACTONE) 25 MG tablet TAKE 1 TABLET BY MOUTH DAILY. 30 tablet 0  . traZODone (DESYREL) 100 MG tablet TAKE 1 TABLET BY MOUTH AT BEDTIME AS NEEDED FOR SLEEP. 30 tablet 2  . Multiple Vitamin (MULTIVITAMIN WITH MINERALS) TABS tablet Take 0.5 tablets by mouth as needed.      No facility-administered medications prior to visit.     ROS Review of Systems  Constitutional: Negative for activity change and appetite change.  HENT: Negative for sinus pressure and sore throat.   Eyes: Negative for visual disturbance.  Respiratory: Negative for cough, chest tightness and shortness of breath.   Cardiovascular: Negative for chest pain and leg swelling.  Gastrointestinal: Positive for abdominal distention. Negative for abdominal pain, constipation and diarrhea.  Endocrine: Negative.   Genitourinary: Negative for dysuria.  Musculoskeletal: Negative for joint swelling and myalgias.  Skin: Negative for rash.  Allergic/Immunologic: Negative.   Neurological: Negative for weakness, light-headedness and numbness.  Psychiatric/Behavioral: Negative for dysphoric mood and suicidal ideas.    Objective:  BP (!) 93/59 (BP Location: Right Arm, Patient Position: Sitting, Cuff Size: Small)   Pulse (!) 57   Temp 97.3 F (36.3 C) (Oral)  Ht 5\' 7"  (1.702 m)   Wt 157 lb 6.4 oz (71.4 kg)   SpO2 96%   BMI 24.65 kg/m   BP/Weight 12/18/2016 08/11/2016 08/01/2016  Systolic BP 93 96 90  Diastolic BP 59 62 64  Wt. (Lbs) 157.4 157.4 159.38  BMI 24.65 24.65 25.15      Physical Exam Constitutional: He is oriented to person, place, and time. He appears well-developed and well-nourished.  Cardiovascular: Bradycardic rate, normal heart sounds and intact distal pulses.   No murmur heard. Pulmonary/Chest: Effort normal and breath  sounds normal. He has no wheezes. He has no rales. He exhibits no tenderness.  Breast: Bilateral gynecomastia, no masses, slightly tender nipples. Abdominal: Soft. Bowel sounds are normal. He exhibits distension   There is no tenderness. Musculoskeletal: Normal range of motion.  Neurological: He is alert and oriented to person, place, and time.  Psych: Normal  Assessment & Plan:   1. Alcoholic cirrhosis of liver with ascites (HCC) Weight is stable compared to his last visit 3 months ago Will work towards repeat ultrasound of the abdomen however he will need to recertify for his CAFA to facilitate this referral. Last seen by GI 07/2016 His blood pressure is on the low side but he is asymptomatic We'll consider reducing dose of Lasix if he is symptomatic. - COMPLETE METABOLIC PANEL WITH GFR - CBC with Differential/Platelet - Protime-INR - spironolactone (ALDACTONE) 25 MG tablet; Take 1 tablet (25 mg total) by mouth daily.  Dispense: 30 tablet; Refill: 5 - propranolol (INDERAL) 10 MG tablet; Take 0.5 tablets (5 mg total) by mouth 3 (three) times daily.  Dispense: 90 tablet; Refill: 3 - furosemide (LASIX) 40 MG tablet; Take 1 tablet (40 mg total) by mouth 2 (two) times daily.  Dispense: 60 tablet; Refill: 5  2. Other insomnia Stable - traZODone (DESYREL) 100 MG tablet; Take 1 tablet (100 mg total) by mouth at bedtime as needed. for insomnia  Dispense: 30 tablet; Refill: 5  3. Gynecomastia, male Likely a side effect of spironolactone He does have some associated nipple tenderness. Advised to hold off on spironolactone for the next one week to assess for resolution of tenderness. I have informed him to discontinue spironolactone altogether puts him at risk of recurrent edema and does not ensure resolution of gynecomastia   4. Major depressive disorder with single episode, in full remission (HCC) Controlled - citalopram (CELEXA) 20 MG tablet; Take 1 tablet (20 mg total) by mouth daily.   Dispense: 30 tablet; Refill: 5   Meds ordered this encounter  Medications  . spironolactone (ALDACTONE) 25 MG tablet    Sig: Take 1 tablet (25 mg total) by mouth daily.    Dispense:  30 tablet    Refill:  5  . propranolol (INDERAL) 10 MG tablet    Sig: Take 0.5 tablets (5 mg total) by mouth 3 (three) times daily.    Dispense:  90 tablet    Refill:  3  . furosemide (LASIX) 40 MG tablet    Sig: Take 1 tablet (40 mg total) by mouth 2 (two) times daily.    Dispense:  60 tablet    Refill:  5  . traZODone (DESYREL) 100 MG tablet    Sig: Take 1 tablet (100 mg total) by mouth at bedtime as needed. for insomnia    Dispense:  30 tablet    Refill:  5  . citalopram (CELEXA) 20 MG tablet    Sig: Take 1 tablet (20 mg total) by mouth  daily.    Dispense:  30 tablet    Refill:  5    Follow-up: Return in about 3 months (around 03/17/2017) for Follow-up on liver cirrhosis.   Jaclyn Shaggy MD

## 2016-12-19 LAB — PROTIME-INR
INR: 1.2 — AB
Prothrombin Time: 12.6 s — ABNORMAL HIGH (ref 9.0–11.5)

## 2017-01-01 ENCOUNTER — Ambulatory Visit: Payer: Self-pay | Admitting: Family Medicine

## 2017-01-01 MED FILL — ?FUROSEMIDE 40 MG TABLET: 40 | 30 days supply | Qty: 30 | Fill #3

## 2017-01-01 MED FILL — ?CITALOPRAM HBR 20 MG TABLE: 20 | 30 days supply | Qty: 30 | Fill #2

## 2017-01-01 MED FILL — traZODone HCL 100 MG TABS: 100 | 30 days supply | Qty: 30 | Fill #0

## 2017-01-08 ENCOUNTER — Encounter: Payer: Self-pay | Admitting: Gastroenterology

## 2017-01-30 ENCOUNTER — Other Ambulatory Visit: Payer: Self-pay

## 2017-01-30 ENCOUNTER — Telehealth: Payer: Self-pay

## 2017-01-30 NOTE — Telephone Encounter (Signed)
-----   Message from Marlowe Kays, CMA sent at 09/02/2016  8:51 AM EST ----- Regarding: 3rd twinrex due  3rd twinrix due in April Standard

## 2017-01-30 NOTE — Telephone Encounter (Signed)
Pt has no phone number on file. Pt letter mailed to address listed.

## 2017-02-05 MED FILL — FUROSEMIDE 40 MG TABLET: 40 | 30 days supply | Qty: 30 | Fill #4

## 2017-02-05 MED FILL — traZODone HCL 100 MG TABS: 100 | 30 days supply | Qty: 30 | Fill #1

## 2017-02-05 MED FILL — ?CITALOPRAM HBR 20 MG TABLE: 20 | 30 days supply | Qty: 30 | Fill #3

## 2017-02-06 NOTE — Telephone Encounter (Signed)
Patient is scheduled for 3rd twinrix on 02/12/17 @ 3:30 pm.

## 2017-02-12 ENCOUNTER — Ambulatory Visit (INDEPENDENT_AMBULATORY_CARE_PROVIDER_SITE_OTHER): Payer: Self-pay | Admitting: Gastroenterology

## 2017-02-12 DIAGNOSIS — Z23 Encounter for immunization: Secondary | ICD-10-CM

## 2017-02-12 MED FILL — PROPRANOLOL 10 MG TABLET: 10 | 30 days supply | Qty: 45 | Fill #0

## 2017-03-05 ENCOUNTER — Other Ambulatory Visit: Payer: Self-pay | Admitting: Family Medicine

## 2017-03-05 MED FILL — FUROSEMIDE 40 MG TABLET: 40 | 30 days supply | Qty: 30 | Fill #5

## 2017-03-05 MED FILL — traZODone HCL 100 MG TABS: 100 | 30 days supply | Qty: 30 | Fill #2

## 2017-03-09 MED FILL — ?CITALOPRAM HBR 20 MG TABLE: 20 | 30 days supply | Qty: 30 | Fill #0

## 2017-03-17 ENCOUNTER — Ambulatory Visit (HOSPITAL_COMMUNITY)
Admission: RE | Admit: 2017-03-17 | Discharge: 2017-03-17 | Disposition: A | Discharge status: Home / Self Care | Payer: Self-pay | Source: Ambulatory Visit | Attending: Family Medicine | Admitting: Family Medicine

## 2017-03-17 ENCOUNTER — Ambulatory Visit (HOSPITAL_BASED_OUTPATIENT_CLINIC_OR_DEPARTMENT_OTHER): Payer: Self-pay | Admitting: Licensed Clinical Social Worker

## 2017-03-17 ENCOUNTER — Ambulatory Visit: Payer: Self-pay | Attending: Family Medicine | Admitting: Family Medicine

## 2017-03-17 ENCOUNTER — Encounter: Payer: Self-pay | Admitting: Family Medicine

## 2017-03-17 ENCOUNTER — Other Ambulatory Visit: Payer: Self-pay

## 2017-03-17 VITALS — BP 103/64 | HR 54 | Temp 98.1°F | Resp 18 | Ht 67.0 in | Wt 156.0 lb

## 2017-03-17 DIAGNOSIS — F325 Major depressive disorder, single episode, in full remission: Secondary | ICD-10-CM

## 2017-03-17 DIAGNOSIS — R5383 Other fatigue: Secondary | ICD-10-CM | POA: Insufficient documentation

## 2017-03-17 DIAGNOSIS — R001 Bradycardia, unspecified: Secondary | ICD-10-CM | POA: Insufficient documentation

## 2017-03-17 DIAGNOSIS — K7031 Alcoholic cirrhosis of liver with ascites: Secondary | ICD-10-CM | POA: Insufficient documentation

## 2017-03-17 DIAGNOSIS — D696 Thrombocytopenia, unspecified: Secondary | ICD-10-CM | POA: Insufficient documentation

## 2017-03-17 DIAGNOSIS — F329 Major depressive disorder, single episode, unspecified: Secondary | ICD-10-CM | POA: Insufficient documentation

## 2017-03-17 MED ORDER — ONDANSETRON HCL 4 MG PO TABS: ORAL_TABLET | ORAL | 3 refills | Status: DC

## 2017-03-17 MED ORDER — CITALOPRAM HYDROBROMIDE 40 MG PO TABS: 40.0000 mg | ORAL_TABLET | Freq: Every day | ORAL | 3 refills | Status: DC

## 2017-03-17 MED ORDER — PROPRANOLOL HCL 10 MG PO TABS: 5.0000 mg | ORAL_TABLET | Freq: Three times a day (TID) | ORAL | 3 refills | Status: DC

## 2017-03-17 MED FILL — PROPRANOLOL 10 MG TABLET: 10 | 30 days supply | Qty: 45 | Fill #0

## 2017-03-17 MED FILL — CITALOPRAM HBR 40 MG TABLET: 40 | 30 days supply | Qty: 30 | Fill #0

## 2017-03-17 MED FILL — ONDANSETRON HCL 4 MG TABLET: 4 | 15 days supply | Qty: 30 | Fill #0

## 2017-03-17 NOTE — Progress Notes (Addendum)
Subjective:  Patient ID: Kenneth [name], male    DOB: June 24, 1961  Age: 56 y.o. MRN: [id number]  CC: Cirrhosis   HPI Kenneth [name] is a 56 year old male with a history of alcoholic cirrhosis of the liver , thrombocytopenia, previous history of SMA thrombosis (Completed course of anticoagulation), depression here for a follow up visit.  He has been compliant with all his medications and Denies shortness of breath, pedal edema, but endorses abdominal swelling which is not worse compared to his previous visit. He discontinued spironolactone due to gynecomastia and breast tenderness and reports resolution of symptoms thereafter.  He complains of depression and states he has had some improvement with Celexa however when he is not able to drink alcohol he feels depressed. He quit alcohol over a year ago due to his liver cirrhosis and denies suicidal ideation or intents. He states he does not have any friends and does not have much of an outlet. Also complains of fatigue which occurs about 3-4 hours after he starts working and this is concerning to him.    Past Medical History:  Diagnosis Date  . Alcohol abuse   . Anxiety   . Blood clot in vein   . Cirrhosis ([place])   . Depression   . Esophageal varices ([place]) 09/02/2014  . H/O [facility] spotted fever    age 80 or 62  . Hiatal hernia 09/02/2014  . Polysubstance abuse   . Portal hypertension with esophageal varices (HCC) 09/02/2014  . Portal hypertensive gastropathy ([place]) 09/02/2014  . Thrombocytopenia ([place]) 09/02/2014  . Upper GI bleed 08/31/2014    Past Surgical History:  Procedure Laterality Date  . ESOPHAGOGASTRODUODENOSCOPY Left 09/01/2014   Procedure: ESOPHAGOGASTRODUODENOSCOPY (EGD);  Surgeon: Juanita Craver, MD;  Location: [facility];  Service: Endoscopy;  Laterality: Left;  . EYE SURGERY Left    age 76    Allergies  Allergen Reactions  . Latex Swelling and Rash     Outpatient Medications Prior to Visit  Medication Sig  Dispense Refill  . furosemide (LASIX) 40 MG tablet Take 1 tablet (40 mg total) by mouth 2 (two) times daily. 60 tablet 5  . Multiple Vitamin (MULTIVITAMIN WITH MINERALS) TABS tablet Take 0.5 tablets by mouth as needed.     . traZODone (DESYREL) 100 MG tablet Take 1 tablet (100 mg total) by mouth at bedtime as needed. for insomnia 30 tablet 5  . citalopram (CELEXA) 20 MG tablet Take 1 tablet (20 mg total) by mouth daily. 30 tablet 5  . ondansetron (ZOFRAN) 4 MG tablet TAKE 1 TABLET BY MOUTH 2 TIMES DAILY AS NEEDED FOR NAUSEA OR VOMITING. 30 tablet 0  . propranolol (INDERAL) 10 MG tablet Take 0.5 tablets (5 mg total) by mouth 3 (three) times daily. 90 tablet 3  . spironolactone (ALDACTONE) 25 MG tablet Take 1 tablet (25 mg total) by mouth daily. 30 tablet 5   No facility-administered medications prior to visit.     ROS Review of Systems Constitutional: Negative for activity change and appetite change.  HENT: Negative for sinus pressure and sore throat.   Eyes: Negative for visual disturbance.  Respiratory: Negative for cough, chest tightness and shortness of breath.   Cardiovascular: Negative for chest pain and leg swelling.  Gastrointestinal: Positive for slight abdominal distention. Negative for abdominal pain, constipation and diarrhea.  Endocrine: Negative.   Genitourinary: Negative for dysuria.  Musculoskeletal: Negative for joint swelling and myalgias.  Skin: Negative for rash.  Allergic/Immunologic: Negative.   Neurological: Negative  for weakness, light-headedness and numbness.  Psychiatric/Behavioral: positive for dysphoric mood and negative for suicidal ideas.  Objective:  BP 103/64 (BP Location: Left Arm, Patient Position: Sitting, Cuff Size: Normal)   Pulse (!) 54   Temp 98.1 F (36.7 C) (Oral)   Resp 18   Ht '5\' 7"'  (1.702 m)   Wt 156 lb (70.8 kg)   SpO2 97%   BMI 24.43 kg/m   BP/Weight 03/17/2017 12/18/2016 75/07/2584  Systolic BP 277 93 96  Diastolic BP 64 59 62    Wt. (Lbs) 156 157.4 157.4  BMI 24.43 24.65 24.65      Physical Exam Constitutional: He is oriented to person, place, and time. He appears well-developed and well-nourished.  Cardiovascular: Bradycardic rate, normal heart sounds and intact distal pulses.   No murmur heard. Pulmonary/Chest: Effort normal and breath sounds normal. He has no wheezes. He has no rales. He exhibits no tenderness.  Breast: Bilateral gynecomastia, no masses, slightly tender nipples. Abdominal: Soft. Bowel sounds are normal. He exhibits an umbilical hernia and slight abdominal distension   There is no tenderness. Musculoskeletal: Normal range of motion.  Neurological: He is alert and oriented to person, place, and time.  Psych: Normal  Assessment & Plan:   1. Major depressive disorder with single episode, in full remission ([place]) Uncontrolled Assessment patient is times from the fact that he is unable to drink alcohol We'll increase dose of Celexa LCSW called in for counseling and referrals for support groups - citalopram (CELEXA) 40 MG tablet; Take 1 tablet (40 mg total) by mouth daily.  Dispense: 30 tablet; Refill: 3  2. Alcoholic cirrhosis of liver with ascites (HCC) Quit alcohol and has been commended on this Unable to tolerate spironolactone due to breast symptoms. Will need to check potassium level as he could become hypokalemic and may need potassium repletion due to Lasix - ondansetron (ZOFRAN) 4 MG tablet; TAKE 1 TABLET BY MOUTH 2 TIMES DAILY AS NEEDED FOR NAUSEA OR VOMITING.  Dispense: 30 tablet; Refill: 3 - propranolol (INDERAL) 10 MG tablet; Take 0.5 tablets (5 mg total) by mouth 3 (three) times daily.  Dispense: 90 tablet; Refill: 3 - CMP14+EGFR  3. Other fatigue Unknown etiology - CBC with Differential/Platelet - Vitamin D, 25-hydroxy  4. Bradycardia Likely secondary to propranolol We'll order EKG given he is on citalopram and trazodone which can prolong QT interval.  Meds ordered this  encounter  Medications  . citalopram (CELEXA) 40 MG tablet    Sig: Take 1 tablet (40 mg total) by mouth daily.    Dispense:  30 tablet    Refill:  3  . ondansetron (ZOFRAN) 4 MG tablet    Sig: TAKE 1 TABLET BY MOUTH 2 TIMES DAILY AS NEEDED FOR NAUSEA OR VOMITING.    Dispense:  30 tablet    Refill:  3  . propranolol (INDERAL) 10 MG tablet    Sig: Take 0.5 tablets (5 mg total) by mouth 3 (three) times daily.    Dispense:  90 tablet    Refill:  3    Follow-up: Return in about 3 months (around 06/17/2017) for Follow-up on liver cirrhosis.   Arnoldo Morale MD

## 2017-03-17 NOTE — BH Specialist Note (Signed)
Integrated Behavioral Health Initial Visit  MRN: [id number] Name: Kenneth [name]   Session Start time: 11:20 AM Session End time: 11:40 AM Total time: 20 minutes  Type of Service: Integrated Behavioral Health- Individual/Family Interpretor:No. Interpretor Name and Language: N/A   Warm Hand Off Completed.       SUBJECTIVE: Kenneth [name] is a 56 y.o. male accompanied by patient. Patient was referred by Dr. Venetia NightAmao for depression. Patient reports the following symptoms/concerns: feelings of sadness and worry, low energy, difficulty focusing, and irritability Duration of problem: Pt reports diagnoses of Depression and Anxiety ten years ago; Severity of problem: moderate  OBJECTIVE: Mood: Anxious and Depressed and Affect: Appropriate Risk of harm to self or others: No plan to harm self or others   LIFE CONTEXT: Family and Social: Pt has stable housing. He states that he has a large family that resides nearby. He speaks often to his 56 yo son School/Work: Pt is employed part-time. He does not receive any public benefits or disability Self-Care: Pt takes prescribed medication for depression Life Changes: Pt has ongoing medical concerns. He has been sober from alcohol for over a year  GOALS ADDRESSED: Patient will reduce symptoms of: depression and increase knowledge and/or ability of: coping skills and also: Increase adequate support systems for patient/family   INTERVENTIONS: Solution-Focused Strategies, Supportive Counseling, Psychoeducation and/or Health Education and Link to [facility]  Standardized Assessments completed: PHQ 2&9  ASSESSMENT: Patient currently experiencing depression and anxiety triggered by ongoing medical concerns and difficulty coping with sobriety of over a year. He reports feelings of sadness and worry, low energy, difficulty focusing, and irritability. Patient may benefit from psychotherapy. LCSWA educated pt on the correlation between physical and  mental health and discussed the benefits of applying healthy strategies to cope with depression. LCSWA validated pt's feelings and commended him on his sobriety. Pt shared that he has participated in AA meetings in the past; however, is not interested in re-initiating services. LCSWA encouraged pt participate in counseling and provided pt with supportive resources to strengthen his support system. Pt inquired about disability and LCSWA explained application process.   PLAN: 1. Follow up with behavioral health clinician on : Pt was encouraged to contact LCSWA if symptoms worsen or fail to improve to [facility]. 2. Behavioral recommendations: LCSWA recommends that pt apply healthy coping skills discussed, initiate counseling, continue to comply with medication, and utilize resources provided. Pt is encouraged to schedule follow up appointment with LCSWA 3. Referral(s): Community Mental Health Services (LME/Outside Clinic) 4. "From scale of 1-10, how likely are you to follow plan?": 7/10  Bridgett LarssonJasmine D Lewis, LCSW 03/17/17 4:54 PM

## 2017-03-17 NOTE — Patient Instructions (Signed)
Cirrhosis °Cirrhosis is long-term (chronic) liver injury. The liver is your largest internal organ, and it performs many functions. The liver converts food into energy, removes toxic material from your blood, makes important proteins, and absorbs necessary vitamins from your diet. °If you have cirrhosis, it means many of your healthy liver cells have been replaced by scar tissue. This prevents blood from flowing through your liver, which makes it difficult for your liver to function. This scarring is not reversible, but treatment can prevent it from getting worse. °What are the causes? °Hepatitis C and long-term alcohol abuse are the most common causes of cirrhosis. Other causes include: °· Nonalcoholic fatty liver disease. °· Hepatitis B infection. °· Autoimmune hepatitis. °· Diseases that cause blockage of ducts inside the liver. °· Inherited liver diseases. °· Reactions to certain long-term medicines. °· Parasitic infections. °· Long-term exposure to certain toxins. °What increases the risk? °You may have a higher risk of cirrhosis if you: °· Have certain hepatitis viruses. °· Abuse alcohol, especially if you are male. °· Are overweight. °· Share needles. °· Have unprotected sex with someone who has hepatitis. °What are the signs or symptoms? °You may not have any signs and symptoms at first. Symptoms may not develop until the damage to your liver starts to get worse. Signs and symptoms of cirrhosis may include: °· Tenderness in the right-upper part of your abdomen. °· Weakness and tiredness (fatigue). °· Loss of appetite. °· Nausea. °· Weight loss and muscle loss. °· Itchiness. °· Yellow skin and eyes (jaundice). °· Buildup of fluid in the abdomen (ascites). °· Swelling of the feet and ankles (edema). °· Appearance of tiny blood vessels under the skin. °· Mental confusion. °· Easy bruising and bleeding. °How is this diagnosed? °Your health care provider may suspect cirrhosis based on your symptoms and medical  history, especially if you have other medical conditions or a history of alcohol abuse. Your health care provider will do a physical exam to feel your liver and check for signs of cirrhosis. Your health care provider may perform other tests, including: °· Blood tests to check: °¨ Whether you have hepatitis B or C. °¨ Kidney function. °¨ Liver function. °· Imaging tests such as: °¨ MRI or CT scan to look for changes seen in advanced cirrhosis. °¨ Ultrasound to see if normal liver tissue is being replaced by scar tissue. °· A procedure using a long needle to take a sample of liver tissue (biopsy) for examination under a microscope. Liver biopsy can confirm the diagnosis of cirrhosis. °How is this treated? °Treatment depends on how damaged your liver is and what caused the damage. Treatment may include treating cirrhosis symptoms or treating the underlying causes of the condition to try to slow the progression of the damage. Treatment may include: °· Making lifestyle changes, such as: °¨ Eating a healthy diet. °¨ Restricting salt intake. °¨ Maintaining a healthy weight. °¨ Not abusing drugs or alcohol. °· Taking medicines to: °¨ Treat liver infections or other infections. °¨ Control itching. °¨ Reduce fluid buildup. °¨ Reduce certain blood toxins. °¨ Reduce risk of bleeding from enlarged blood vessels in the stomach or esophagus (varices). °· If varices are causing bleeding problems, you may need treatment with a procedure that ties up the vessels causing them to fall off (band ligation). °· If cirrhosis is causing your liver to fail, your health care provider may recommend a liver transplant. °· Other treatments may be recommended depending on any complications of cirrhosis, such as   liver-related kidney failure (hepatorenal syndrome). °Follow these instructions at home: °· Take medicines only as directed by your health care provider. Do not use drugs that are toxic to your liver. Ask your health care provider before  taking any new medicines, including over-the-counter medicines. °· Rest as needed. °· Eat a well-balanced diet. Ask your health care provider or dietitian for more information. °· You may have to follow a low-salt diet or restrict your water intake as directed. °· Do not drink alcohol. This is especially important if you are taking acetaminophen. °· Keep all follow-up visits as directed by your health care provider. This is important. °Contact a health care provider if: °· You have fatigue or weakness that is getting worse. °· You develop swelling of the hands, feet, legs, or face. °· You have a fever. °· You develop loss of appetite. °· You have nausea or vomiting. °· You develop jaundice. °· You develop easy bruising or bleeding. °Get help right away if: °· You vomit bright red blood or a material that looks like coffee grounds. °· You have blood in your stools. °· Your stools appear black and tarry. °· You become confused. °· You have chest pain or trouble breathing. °This information is not intended to replace advice given to you by your health care provider. Make sure you discuss any questions you have with your health care provider. °Document Released: 10/13/2005 Document Revised: 02/21/2016 Document Reviewed: 06/21/2014 °Elsevier Interactive Patient Education © 2017 [employer] ° °

## 2017-03-17 NOTE — Progress Notes (Signed)
Patient is here for FU Cirrhosis  Patient complains of joint pain being present and scaled currently at a 1.  Patient has taken medication today. Patient has not eaten today.  Patient has not taken spironalactone due to symptom of sore nipples occurring.  Patient request a refill on Zofran

## 2017-03-18 LAB — CMP14+EGFR
ALK PHOS: 107 IU/L (ref 39–117)
ALT: 28 IU/L (ref 0–44)
AST: 39 IU/L (ref 0–40)
Albumin/Globulin Ratio: 1.2 (ref 1.2–2.2)
Albumin: 3.9 g/dL (ref 3.5–5.5)
BUN/Creatinine Ratio: 12 (ref 9–20)
BUN: 9 mg/dL (ref 6–24)
Bilirubin Total: 0.7 mg/dL (ref 0.0–1.2)
CALCIUM: 9 mg/dL (ref 8.7–10.2)
CO2: 25 mmol/L (ref 18–29)
CREATININE: 0.73 mg/dL — AB (ref 0.76–1.27)
Chloride: 105 mmol/L (ref 96–106)
GFR calc Af Amer: 121 mL/min/{1.73_m2} (ref >59)
GFR, EST NON AFRICAN AMERICAN: 104 mL/min/{1.73_m2} (ref >59)
GLUCOSE: 89 mg/dL (ref 65–99)
Globulin, Total: 3.2 g/dL (ref 1.5–4.5)
Potassium: 4.2 mmol/L (ref 3.5–5.2)
SODIUM: 142 mmol/L (ref 134–144)
Total Protein: 7.1 g/dL (ref 6.0–8.5)

## 2017-03-18 LAB — CBC WITH DIFFERENTIAL/PLATELET
Basophils Absolute: 0.1 10*3/uL (ref 0.0–0.2)
Basos: 1 %
EOS (ABSOLUTE): 0.2 10*3/uL (ref 0.0–0.4)
EOS: 5 %
HEMOGLOBIN: 13.8 g/dL (ref 13.0–17.7)
Hematocrit: 40 % (ref 37.5–51.0)
IMMATURE GRANS (ABS): 0 10*3/uL (ref 0.0–0.1)
IMMATURE GRANULOCYTES: 0 %
LYMPHS: 19 %
Lymphocytes Absolute: 0.8 10*3/uL (ref 0.7–3.1)
MCH: 30.8 pg (ref 26.6–33.0)
MCHC: 34.5 g/dL (ref 31.5–35.7)
MCV: 89 fL (ref 79–97)
MONOCYTES: 10 %
MONOS ABS: 0.5 10*3/uL (ref 0.1–0.9)
NEUTROS PCT: 65 %
Neutrophils Absolute: 2.9 10*3/uL (ref 1.4–7.0)
Platelets: 77 10*3/uL — CL (ref 150–379)
RBC: 4.48 x10E6/uL (ref 4.14–5.80)
RDW: 14.1 % (ref 12.3–15.4)
WBC: 4.5 10*3/uL (ref 3.4–10.8)

## 2017-03-18 LAB — VITAMIN D 25 HYDROXY (VIT D DEFICIENCY, FRACTURES): VIT D 25 HYDROXY: 32.9 ng/mL (ref 30.0–100.0)

## 2017-04-01 ENCOUNTER — Encounter: Payer: Self-pay | Admitting: Family Medicine

## 2017-04-08 MED FILL — ?TRAZODONE 100 MG TABLET: 100 | 30 days supply | Qty: 30 | Fill #3

## 2017-04-08 MED FILL — FUROSEMIDE 40 MG TABLET: 40 | 30 days supply | Qty: 30 | Fill #6

## 2017-04-22 MED FILL — PROPRANOLOL 10 MG TABLET: 10 | 30 days supply | Qty: 45 | Fill #1

## 2017-05-19 ENCOUNTER — Ambulatory Visit: Payer: Self-pay | Admitting: Family Medicine

## 2017-05-21 ENCOUNTER — Encounter: Payer: Self-pay | Admitting: Family Medicine

## 2017-05-21 ENCOUNTER — Ambulatory Visit: Payer: Self-pay | Attending: Family Medicine | Admitting: Family Medicine

## 2017-05-21 DIAGNOSIS — K766 Portal hypertension: Secondary | ICD-10-CM | POA: Insufficient documentation

## 2017-05-21 DIAGNOSIS — G4700 Insomnia, unspecified: Secondary | ICD-10-CM | POA: Insufficient documentation

## 2017-05-21 DIAGNOSIS — F419 Anxiety disorder, unspecified: Secondary | ICD-10-CM | POA: Insufficient documentation

## 2017-05-21 DIAGNOSIS — Z9889 Other specified postprocedural states: Secondary | ICD-10-CM | POA: Insufficient documentation

## 2017-05-21 DIAGNOSIS — Z86718 Personal history of other venous thrombosis and embolism: Secondary | ICD-10-CM | POA: Insufficient documentation

## 2017-05-21 DIAGNOSIS — G4709 Other insomnia: Secondary | ICD-10-CM

## 2017-05-21 DIAGNOSIS — K3189 Other diseases of stomach and duodenum: Secondary | ICD-10-CM | POA: Insufficient documentation

## 2017-05-21 DIAGNOSIS — K7031 Alcoholic cirrhosis of liver with ascites: Secondary | ICD-10-CM

## 2017-05-21 DIAGNOSIS — Z91040 Latex allergy status: Secondary | ICD-10-CM | POA: Insufficient documentation

## 2017-05-21 DIAGNOSIS — D696 Thrombocytopenia, unspecified: Secondary | ICD-10-CM | POA: Insufficient documentation

## 2017-05-21 DIAGNOSIS — F325 Major depressive disorder, single episode, in full remission: Secondary | ICD-10-CM

## 2017-05-21 MED ORDER — CITALOPRAM HYDROBROMIDE 40 MG PO TABS: 40.0000 mg | ORAL_TABLET | Freq: Every day | ORAL | 3 refills | Status: DC

## 2017-05-21 MED ORDER — TRAZODONE HCL 100 MG PO TABS: 100.0000 mg | ORAL_TABLET | Freq: Every evening | ORAL | 5 refills | Status: DC | PRN

## 2017-05-21 MED ORDER — FUROSEMIDE 40 MG PO TABS: 40.0000 mg | ORAL_TABLET | Freq: Every day | ORAL | 5 refills | Status: DC

## 2017-05-21 MED ORDER — PROPRANOLOL HCL 10 MG PO TABS: 5.0000 mg | ORAL_TABLET | Freq: Three times a day (TID) | ORAL | 3 refills | Status: DC

## 2017-05-21 MED FILL — PROPRANOLOL 10 MG TABLET: 10 | 30 days supply | Qty: 45 | Fill #0

## 2017-05-21 MED FILL — ?CITALOPRAM HBR 40 MG TABLE: 40 | 30 days supply | Qty: 30 | Fill #0

## 2017-05-21 MED FILL — FUROSEMIDE 40 MG TABLET: 40 | 30 days supply | Qty: 30 | Fill #0

## 2017-05-21 MED FILL — traZODone HCL 100 MG TABS: 100 | 30 days supply | Qty: 30 | Fill #0

## 2017-05-21 NOTE — Progress Notes (Signed)
Subjective:  Patient ID: Kenneth [name], male    DOB: [birth year]  Age: 56 y.o. MRN: [id number]  CC: Cirrhosis   HPI Kenneth [name] is a 56 year old male with a history of alcoholic cirrhosis of the liver , thrombocytopenia, previous history of SMA thrombosis (Completed course of anticoagulation), depression here for a follow up visit.  He has been compliant with all his medications and Denies shortness of breath, pedal edema. The fatigue he had complained of previously has resolved and he has no acute concerns today. He denies easy bruising or bleeding, Also denies pedal edema, chest pains or shortness of breath. Continues to abstain from alcohol.  Depression and insomnia controlled on current medications. Denies suicidal ideation or intents.  Past Medical History:  Diagnosis Date  . Alcohol abuse   . Anxiety   . Blood clot in vein   . Cirrhosis ([place])   . Depression   . Esophageal varices ([place]) 09/02/2014  . H/O [facility] spotted fever    age 47 or 67  . Hiatal hernia 09/02/2014  . Polysubstance abuse   . Portal hypertension with esophageal varices (HCC) 09/02/2014  . Portal hypertensive gastropathy ([place]) 09/02/2014  . Thrombocytopenia ([place]) 09/02/2014  . Upper GI bleed 08/31/2014    Past Surgical History:  Procedure Laterality Date  . ESOPHAGOGASTRODUODENOSCOPY Left 09/01/2014   Procedure: ESOPHAGOGASTRODUODENOSCOPY (EGD);  Surgeon: Juanita Craver, MD;  Location: [facility] END[facility];  Service: Endoscopy;  Laterality: Left;  . EYE SURGERY Left    age 11    Allergies  Allergen Reactions  . Latex Swelling and Rash      Outpatient Medications Prior to Visit  Medication Sig Dispense Refill  . Multiple Vitamin (MULTIVITAMIN WITH MINERALS) TABS tablet Take 0.5 tablets by mouth as needed.     . ondansetron (ZOFRAN) 4 MG tablet TAKE 1 TABLET BY MOUTH 2 TIMES DAILY AS NEEDED FOR NAUSEA OR VOMITING. 30 tablet 3  . citalopram (CELEXA) 40 MG tablet Take 1 tablet (40 mg total) by mouth  daily. 30 tablet 3  . furosemide (LASIX) 40 MG tablet Take 1 tablet (40 mg total) by mouth 2 (two) times daily. 60 tablet 5  . propranolol (INDERAL) 10 MG tablet Take 0.5 tablets (5 mg total) by mouth 3 (three) times daily. 90 tablet 3  . traZODone (DESYREL) 100 MG tablet Take 1 tablet (100 mg total) by mouth at bedtime as needed. for insomnia 30 tablet 5   No facility-administered medications prior to visit.     ROS Review of Systems  Constitutional: Negative for activity change and appetite change.  HENT: Negative for sinus pressure and sore throat.   Eyes: Negative for visual disturbance.  Respiratory: Negative for cough, chest tightness and shortness of breath.   Cardiovascular: Negative for chest pain and leg swelling.  Gastrointestinal: Negative for abdominal distention, abdominal pain, constipation and diarrhea.  Endocrine: Negative.   Genitourinary: Negative for dysuria.  Musculoskeletal: Negative for joint swelling and myalgias.  Skin: Negative for rash.  Allergic/Immunologic: Negative.   Neurological: Negative for weakness, light-headedness and numbness.  Psychiatric/Behavioral: Negative for dysphoric mood and suicidal ideas.    Objective:  BP 96/69 (BP Location: Left Arm, Patient Position: Sitting, Cuff Size: Normal)   Pulse (!) 55   Temp 98.5 F (36.9 C) (Oral)   Resp 18   Ht '5\' 7"'  (1.702 m)   Wt 157 lb (71.2 kg)   SpO2 96%   BMI 24.59 kg/m   BP/Weight 05/21/2017 03/17/2017 02/02/8118  Systolic  BP 96 299 93  Diastolic BP 69 64 59  Wt. (Lbs) 157 156 157.4  BMI 24.59 24.43 24.65      Physical Exam  Constitutional: He is oriented to person, place, and time. He appears well-developed and well-nourished.  Cardiovascular: Normal rate, normal heart sounds and intact distal pulses.   No murmur heard. Pulmonary/Chest: Effort normal and breath sounds normal. He has no wheezes. He has no rales. He exhibits no tenderness.  Abdominal: Soft. Bowel sounds are normal. He  exhibits no distension and no mass. There is no tenderness.  Musculoskeletal: Normal range of motion.  Neurological: He is alert and oriented to person, place, and time.  Skin: Skin is warm and dry.  Psychiatric: He has a normal mood and affect.    CMP Latest Ref Rng & Units 03/17/2017 12/18/2016 07/28/2016  Glucose 65 - 99 mg/dL 89 87 69  BUN 6 - 24 mg/dL '9 9 10  ' Creatinine 0.76 - 1.27 mg/dL 0.73(L) 0.70 0.67(L)  Sodium 134 - 144 mmol/L 142 140 139  Potassium 3.5 - 5.2 mmol/L 4.2 4.4 4.2  Chloride 96 - 106 mmol/L 105 107 102  CO2 18 - 29 mmol/L '25 24 28  ' Calcium 8.7 - 10.2 mg/dL 9.0 8.9 9.4  Total Protein 6.0 - 8.5 g/dL 7.1 7.3 7.4  Total Bilirubin 0.0 - 1.2 mg/dL 0.7 0.8 0.8  Alkaline Phos 39 - 117 IU/L 107 75 79  AST 0 - 40 IU/L 39 32 32  ALT 0 - 44 IU/L '28 20 23    ' CBC    Component Value Date/Time   WBC 4.5 03/17/2017 1151   WBC 4.3 12/18/2016 1149   RBC 4.48 03/17/2017 1151   RBC 4.49 12/18/2016 1149   HGB 13.8 03/17/2017 1151   HCT 40.0 03/17/2017 1151   PLT 77 (LL) 03/17/2017 1151   MCV 89 03/17/2017 1151   MCH 30.8 03/17/2017 1151   MCH 31.0 12/18/2016 1149   MCHC 34.5 03/17/2017 1151   MCHC 33.8 12/18/2016 1149   RDW 14.1 03/17/2017 1151   LYMPHSABS 0.8 03/17/2017 1151   MONOABS 774 12/18/2016 1149   EOSABS 0.2 03/17/2017 1151   BASOSABS 0.1 03/17/2017 1151     Assessment & Plan:   1. Alcoholic cirrhosis of liver with ascites (HCC) Stable Blood pressure is on the soft side-advised to reduce furosemide to 20 mg daily a lightheaded Advised to check blood pressures intermittently at home - propranolol (INDERAL) 10 MG tablet; Take 0.5 tablets (5 mg total) by mouth 3 (three) times daily.  Dispense: 90 tablet; Refill: 3 - furosemide (LASIX) 40 MG tablet; Take 1 tablet (40 mg total) by mouth daily.  Dispense: 30 tablet; Refill: 5 - CMP14+EGFR - Protime-INR - CBC with Differential/Platelet  2. Major depressive disorder with single episode, in full remission  (HCC) Stable - citalopram (CELEXA) 40 MG tablet; Take 1 tablet (40 mg total) by mouth daily.  Dispense: 30 tablet; Refill: 3  3. Other insomnia Controlled - traZODone (DESYREL) 100 MG tablet; Take 1 tablet (100 mg total) by mouth at bedtime as needed. for insomnia  Dispense: 30 tablet; Refill: 5   Meds ordered this encounter  Medications  . propranolol (INDERAL) 10 MG tablet    Sig: Take 0.5 tablets (5 mg total) by mouth 3 (three) times daily.    Dispense:  90 tablet    Refill:  3  . furosemide (LASIX) 40 MG tablet    Sig: Take 1 tablet (40 mg total) by mouth  daily.    Dispense:  30 tablet    Refill:  5  . citalopram (CELEXA) 40 MG tablet    Sig: Take 1 tablet (40 mg total) by mouth daily.    Dispense:  30 tablet    Refill:  3  . traZODone (DESYREL) 100 MG tablet    Sig: Take 1 tablet (100 mg total) by mouth at bedtime as needed. for insomnia    Dispense:  30 tablet    Refill:  5    Follow-up: Return in about 3 months (around 08/21/2017) for follow up of cirrhosis.   Arnoldo Morale MD

## 2017-05-21 NOTE — Patient Instructions (Signed)
Cirrhosis Cirrhosis is long-term (chronic) liver injury. The liver is your largest internal organ, and it performs many functions. The liver converts food into energy, removes toxic material from your blood, makes important proteins, and absorbs necessary vitamins from your diet. If you have cirrhosis, it means many of your healthy liver cells have been replaced by scar tissue. This prevents blood from flowing through your liver, which makes it difficult for your liver to function. This scarring is not reversible, but treatment can prevent it from getting worse. What are the causes? Hepatitis C and long-term alcohol abuse are the most common causes of cirrhosis. Other causes include:  Nonalcoholic fatty liver disease.  Hepatitis B infection.  Autoimmune hepatitis.  Diseases that cause blockage of ducts inside the liver.  Inherited liver diseases.  Reactions to certain long-term medicines.  Parasitic infections.  Long-term exposure to certain toxins.  What increases the risk? You may have a higher risk of cirrhosis if you:  Have certain hepatitis viruses.  Abuse alcohol, especially if you are male.  Are overweight.  Share needles.  Have unprotected sex with someone who has hepatitis.  What are the signs or symptoms? You may not have any signs and symptoms at first. Symptoms may not develop until the damage to your liver starts to get worse. Signs and symptoms of cirrhosis may include:  Tenderness in the right-upper part of your abdomen.  Weakness and tiredness (fatigue).  Loss of appetite.  Nausea.  Weight loss and muscle loss.  Itchiness.  Yellow skin and eyes (jaundice).  Buildup of fluid in the abdomen (ascites).  Swelling of the feet and ankles (edema).  Appearance of tiny blood vessels under the skin.  Mental confusion.  Easy bruising and bleeding.  How is this diagnosed? Your health care provider may suspect cirrhosis based on your symptoms and  medical history, especially if you have other medical conditions or a history of alcohol abuse. Your health care provider will do a physical exam to feel your liver and check for signs of cirrhosis. Your health care provider may perform other tests, including:  Blood tests to check: ? Whether you have hepatitis B or C. ? Kidney function. ? Liver function.  Imaging tests such as: ? MRI or CT scan to look for changes seen in advanced cirrhosis. ? Ultrasound to see if normal liver tissue is being replaced by scar tissue.  A procedure using a long needle to take a sample of liver tissue (biopsy) for examination under a microscope. Liver biopsy can confirm the diagnosis of cirrhosis.  How is this treated? Treatment depends on how damaged your liver is and what caused the damage. Treatment may include treating cirrhosis symptoms or treating the underlying causes of the condition to try to slow the progression of the damage. Treatment may include:  Making lifestyle changes, such as: ? Eating a healthy diet. ? Restricting salt intake. ? Maintaining a healthy weight. ? Not abusing drugs or alcohol.  Taking medicines to: ? Treat liver infections or other infections. ? Control itching. ? Reduce fluid buildup. ? Reduce certain blood toxins. ? Reduce risk of bleeding from enlarged blood vessels in the stomach or esophagus (varices).  If varices are causing bleeding problems, you may need treatment with a procedure that ties up the vessels causing them to fall off (band ligation).  If cirrhosis is causing your liver to fail, your health care provider may recommend a liver transplant.  Other treatments may be recommended depending on any complications   of cirrhosis, such as liver-related kidney failure (hepatorenal syndrome).  Follow these instructions at home:  Take medicines only as directed by your health care provider. Do not use drugs that are toxic to your liver. Ask your health care  provider before taking any new medicines, including over-the-counter medicines.  Rest as needed.  Eat a well-balanced diet. Ask your health care provider or dietitian for more information.  You may have to follow a low-salt diet or restrict your water intake as directed.  Do not drink alcohol. This is especially important if you are taking acetaminophen.  Keep all follow-up visits as directed by your health care provider. This is important. Contact a health care provider if:  You have fatigue or weakness that is getting worse.  You develop swelling of the hands, feet, legs, or face.  You have a fever.  You develop loss of appetite.  You have nausea or vomiting.  You develop jaundice.  You develop easy bruising or bleeding. Get help right away if:  You vomit bright red blood or a material that looks like coffee grounds.  You have blood in your stools.  Your stools appear black and tarry.  You become confused.  You have chest pain or trouble breathing. This information is not intended to replace advice given to you by your health care provider. Make sure you discuss any questions you have with your health care provider. Document Released: 10/13/2005 Document Revised: 02/21/2016 Document Reviewed: 06/21/2014 Elsevier Interactive Patient Education  2018 [employer]  

## 2017-05-22 LAB — PROTIME-INR
INR: 1.1 (ref 0.8–1.2)
Prothrombin Time: 12.1 s — ABNORMAL HIGH (ref 9.1–12.0)

## 2017-05-22 LAB — CBC WITH DIFFERENTIAL/PLATELET
BASOS: 1 %
Basophils Absolute: 0 10*3/uL (ref 0.0–0.2)
EOS (ABSOLUTE): 0.2 10*3/uL (ref 0.0–0.4)
EOS: 4 %
Hematocrit: 40.7 % (ref 37.5–51.0)
Hemoglobin: 13.7 g/dL (ref 13.0–17.7)
IMMATURE GRANULOCYTES: 0 %
Immature Grans (Abs): 0 10*3/uL (ref 0.0–0.1)
Lymphocytes Absolute: 0.8 10*3/uL (ref 0.7–3.1)
Lymphs: 16 %
MCH: 31.1 pg (ref 26.6–33.0)
MCHC: 33.7 g/dL (ref 31.5–35.7)
MCV: 92 fL (ref 79–97)
MONOS ABS: 0.6 10*3/uL (ref 0.1–0.9)
Monocytes: 13 %
NEUTROS ABS: 3 10*3/uL (ref 1.4–7.0)
NEUTROS PCT: 66 %
PLATELETS: 81 10*3/uL — AB (ref 150–379)
RBC: 4.41 x10E6/uL (ref 4.14–5.80)
RDW: 14.9 % (ref 12.3–15.4)
WBC: 4.6 10*3/uL (ref 3.4–10.8)

## 2017-05-22 LAB — CMP14+EGFR
A/G RATIO: 1.2 (ref 1.2–2.2)
ALBUMIN: 3.9 g/dL (ref 3.5–5.5)
ALT: 25 IU/L (ref 0–44)
AST: 38 IU/L (ref 0–40)
Alkaline Phosphatase: 93 IU/L (ref 39–117)
BILIRUBIN TOTAL: 0.6 mg/dL (ref 0.0–1.2)
BUN / CREAT RATIO: 10 (ref 9–20)
BUN: 7 mg/dL (ref 6–24)
CHLORIDE: 105 mmol/L (ref 96–106)
CO2: 24 mmol/L (ref 20–29)
Calcium: 9.1 mg/dL (ref 8.7–10.2)
Creatinine, Ser: 0.71 mg/dL — ABNORMAL LOW (ref 0.76–1.27)
GFR calc non Af Amer: 105 mL/min/{1.73_m2} (ref >59)
GFR, EST AFRICAN AMERICAN: 121 mL/min/{1.73_m2} (ref >59)
GLOBULIN, TOTAL: 3.2 g/dL (ref 1.5–4.5)
Glucose: 96 mg/dL (ref 65–99)
POTASSIUM: 4.3 mmol/L (ref 3.5–5.2)
SODIUM: 143 mmol/L (ref 134–144)
TOTAL PROTEIN: 7.1 g/dL (ref 6.0–8.5)

## 2017-06-23 MED FILL — FUROSEMIDE 40 MG TABLET: 40 | 30 days supply | Qty: 30 | Fill #1

## 2017-06-23 MED FILL — ?CITALOPRAM HBR 40 MG TABLE: 40 | 30 days supply | Qty: 30 | Fill #1

## 2017-06-23 MED FILL — PROPRANOLOL 10 MG TABLET: 10 | 30 days supply | Qty: 45 | Fill #1

## 2017-07-27 MED FILL — ONDANSETRON HCL 4 MG TABLET: 4 | 15 days supply | Qty: 30 | Fill #1

## 2017-07-27 MED FILL — PROPRANOLOL 10 MG TABLET: 10 | 30 days supply | Qty: 45 | Fill #2

## 2017-07-27 MED FILL — ?CITALOPRAM HBR 40 MG TABLE: 40 | 30 days supply | Qty: 30 | Fill #2

## 2017-07-27 MED FILL — traZODone HCL 100 MG TABS: 100 | 30 days supply | Qty: 30 | Fill #1

## 2017-07-27 MED FILL — FUROSEMIDE 40 MG TABLET: 40 | 30 days supply | Qty: 30 | Fill #2

## 2017-08-20 ENCOUNTER — Ambulatory Visit: Payer: Self-pay | Admitting: Family Medicine

## 2017-09-03 MED FILL — ?FUROSEMIDE 40 MG TABLET: 40 | 30 days supply | Qty: 30 | Fill #3

## 2017-09-03 MED FILL — PROPRANOLOL 10 MG TABLET: 10 | 30 days supply | Qty: 45 | Fill #3

## 2017-09-03 MED FILL — traZODone HCL 100 MG TABS: 100 | 30 days supply | Qty: 30 | Fill #2

## 2017-09-03 MED FILL — ?CITALOPRAM HBR 40 MG TABLE: 40 | 30 days supply | Qty: 30 | Fill #3

## 2017-09-08 ENCOUNTER — Ambulatory Visit: Payer: Self-pay | Attending: Family Medicine | Admitting: Family Medicine

## 2017-09-08 ENCOUNTER — Encounter: Payer: Self-pay | Admitting: Family Medicine

## 2017-09-08 VITALS — BP 123/71 | HR 72 | Temp 98.2°F | Ht 67.0 in | Wt 169.0 lb

## 2017-09-08 DIAGNOSIS — G4700 Insomnia, unspecified: Secondary | ICD-10-CM | POA: Insufficient documentation

## 2017-09-08 DIAGNOSIS — K7031 Alcoholic cirrhosis of liver with ascites: Secondary | ICD-10-CM | POA: Insufficient documentation

## 2017-09-08 DIAGNOSIS — Z23 Encounter for immunization: Secondary | ICD-10-CM | POA: Insufficient documentation

## 2017-09-08 DIAGNOSIS — F325 Major depressive disorder, single episode, in full remission: Secondary | ICD-10-CM | POA: Insufficient documentation

## 2017-09-08 DIAGNOSIS — G4709 Other insomnia: Secondary | ICD-10-CM

## 2017-09-08 DIAGNOSIS — K3189 Other diseases of stomach and duodenum: Secondary | ICD-10-CM | POA: Insufficient documentation

## 2017-09-08 DIAGNOSIS — F419 Anxiety disorder, unspecified: Secondary | ICD-10-CM | POA: Insufficient documentation

## 2017-09-08 DIAGNOSIS — K766 Portal hypertension: Secondary | ICD-10-CM | POA: Insufficient documentation

## 2017-09-08 DIAGNOSIS — Z79899 Other long term (current) drug therapy: Secondary | ICD-10-CM | POA: Insufficient documentation

## 2017-09-08 DIAGNOSIS — K449 Diaphragmatic hernia without obstruction or gangrene: Secondary | ICD-10-CM | POA: Insufficient documentation

## 2017-09-08 DIAGNOSIS — D696 Thrombocytopenia, unspecified: Secondary | ICD-10-CM | POA: Insufficient documentation

## 2017-09-08 MED ORDER — CITALOPRAM HYDROBROMIDE 40 MG PO TABS: 40.0000 mg | ORAL_TABLET | Freq: Every day | ORAL | 5 refills | Status: DC

## 2017-09-08 MED ORDER — FUROSEMIDE 40 MG PO TABS: 40.0000 mg | ORAL_TABLET | Freq: Every day | ORAL | 5 refills | Status: DC

## 2017-09-08 MED ORDER — TRAZODONE HCL 100 MG PO TABS: 100.0000 mg | ORAL_TABLET | Freq: Every evening | ORAL | 5 refills | Status: DC | PRN

## 2017-09-08 MED ORDER — PROPRANOLOL HCL 10 MG PO TABS: 5.0000 mg | ORAL_TABLET | Freq: Three times a day (TID) | ORAL | 3 refills | Status: DC

## 2017-09-09 DIAGNOSIS — Z23 Encounter for immunization: Secondary | ICD-10-CM

## 2017-09-09 LAB — CBC WITH DIFFERENTIAL/PLATELET
BASOS: 0 %
Basophils Absolute: 0 10*3/uL (ref 0.0–0.2)
EOS (ABSOLUTE): 0.1 10*3/uL (ref 0.0–0.4)
EOS: 3 %
HEMATOCRIT: 38.6 % (ref 37.5–51.0)
HEMOGLOBIN: 13.1 g/dL (ref 13.0–17.7)
Immature Grans (Abs): 0 10*3/uL (ref 0.0–0.1)
Immature Granulocytes: 0 %
LYMPHS ABS: 0.7 10*3/uL (ref 0.7–3.1)
Lymphs: 17 %
MCH: 30.9 pg (ref 26.6–33.0)
MCHC: 33.9 g/dL (ref 31.5–35.7)
MCV: 91 fL (ref 79–97)
MONOS ABS: 0.6 10*3/uL (ref 0.1–0.9)
Monocytes: 15 %
Neutrophils Absolute: 2.5 10*3/uL (ref 1.4–7.0)
Neutrophils: 65 %
Platelets: 64 10*3/uL — CL (ref 150–379)
RBC: 4.24 x10E6/uL (ref 4.14–5.80)
RDW: 14.3 % (ref 12.3–15.4)
WBC: 3.8 10*3/uL (ref 3.4–10.8)

## 2017-09-09 LAB — CMP14+EGFR
A/G RATIO: 1.2 (ref 1.2–2.2)
ALBUMIN: 3.9 g/dL (ref 3.5–5.5)
ALK PHOS: 116 IU/L (ref 39–117)
ALT: 30 IU/L (ref 0–44)
AST: 45 IU/L — ABNORMAL HIGH (ref 0–40)
BILIRUBIN TOTAL: 1 mg/dL (ref 0.0–1.2)
BUN / CREAT RATIO: 10 (ref 9–20)
BUN: 7 mg/dL (ref 6–24)
CALCIUM: 9.1 mg/dL (ref 8.7–10.2)
CO2: 25 mmol/L (ref 20–29)
Chloride: 105 mmol/L (ref 96–106)
Creatinine, Ser: 0.72 mg/dL — ABNORMAL LOW (ref 0.76–1.27)
GFR calc Af Amer: 121 mL/min/{1.73_m2} (ref >59)
GFR calc non Af Amer: 104 mL/min/{1.73_m2} (ref >59)
GLUCOSE: 89 mg/dL (ref 65–99)
Globulin, Total: 3.3 g/dL (ref 1.5–4.5)
Potassium: 4.2 mmol/L (ref 3.5–5.2)
SODIUM: 144 mmol/L (ref 134–144)
TOTAL PROTEIN: 7.2 g/dL (ref 6.0–8.5)

## 2017-09-09 NOTE — Progress Notes (Signed)
Subjective:  Patient ID: Kenneth [name], male    DOB: 03/23/1961  Age: 56 y.o. MRN: [id number]  CC: Cirrhosis   HPI [name] is a 56 year old male with a history of alcoholic cirrhosis of the liver , thrombocytopenia, previous history of SMA thrombosis , depression here for a follow up visit.  He has been sober from alcohol for the last one and a half years and has been compliant with his medications until 5 days ago when he ran out.  He has noticed a weight gain of 12 pounds since his last office visit. Complains of abdominal distention but no pedal edema or shortness of breath. He denies excessive bruising or bleeding.  He is doing well on his antidepressant and denies suicidal ideations or intents. His insomnia is controlled on trazodone.  Past Medical History:  Diagnosis Date  . Alcohol abuse   . Anxiety   . Blood clot in vein   . Cirrhosis ([place])   . Depression   . Esophageal varices ([place]) 09/02/2014  . H/O [facility] spotted fever    age 12 or 69  . Hiatal hernia 09/02/2014  . Polysubstance abuse ([place])   . Portal hypertension with esophageal varices (HCC) 09/02/2014  . Portal hypertensive gastropathy ([place]) 09/02/2014  . Thrombocytopenia ([address]) 09/02/2014  . Upper GI bleed 08/31/2014    Past Surgical History:  Procedure Laterality Date  . EYE SURGERY Left    age 16     Outpatient Medications Prior to Visit  Medication Sig Dispense Refill  . Multiple Vitamin (MULTIVITAMIN WITH MINERALS) TABS tablet Take 0.5 tablets by mouth as needed.     . ondansetron (ZOFRAN) 4 MG tablet TAKE 1 TABLET BY MOUTH 2 TIMES DAILY AS NEEDED FOR NAUSEA OR VOMITING. 30 tablet 3  . citalopram (CELEXA) 40 MG tablet Take 1 tablet (40 mg total) by mouth daily. 30 tablet 3  . furosemide (LASIX) 40 MG tablet Take 1 tablet (40 mg total) by mouth daily. 30 tablet 5  . propranolol (INDERAL) 10 MG tablet Take 0.5 tablets (5 mg total) by mouth 3 (three) times daily. 90 tablet 3  . traZODone  (DESYREL) 100 MG tablet Take 1 tablet (100 mg total) by mouth at bedtime as needed. for insomnia 30 tablet 5   No facility-administered medications prior to visit.     ROS Review of Systems  Constitutional: Negative for activity change and appetite change.  HENT: Negative for sinus pressure and sore throat.   Eyes: Negative for visual disturbance.  Respiratory: Negative for cough, chest tightness and shortness of breath.   Cardiovascular: Negative for chest pain and leg swelling.  Gastrointestinal: Positive for abdominal distention. Negative for abdominal pain, constipation and diarrhea.  Endocrine: Negative.   Genitourinary: Negative for dysuria.  Musculoskeletal: Negative for joint swelling and myalgias.  Skin: Negative for rash.  Allergic/Immunologic: Negative.   Neurological: Negative for weakness, light-headedness and numbness.  Psychiatric/Behavioral: Negative for dysphoric mood and suicidal ideas.    Objective:  BP 123/71   Pulse 72   Temp 98.2 F (36.8 C) (Oral)   Ht _0  (1.702 m)   Wt 169 lb (76.7 kg)   SpO2 96%   BMI 26.47 kg/m   BP/Weight 09/08/2017 05/21/2017 05/08/4579  Systolic BP 998 96 338  Diastolic BP 71 69 64  Wt. (Lbs) 169 157 156  BMI 26.47 24.59 24.43     Physical Exam  Constitutional: He is oriented to person, place, and time. He appears well-developed  and well-nourished.  Cardiovascular: Normal rate, normal heart sounds and intact distal pulses.  No murmur heard. Pulmonary/Chest: Effort normal and breath sounds normal. He has no wheezes. He has no rales. He exhibits no tenderness.  Abdominal: Soft. Bowel sounds are normal. He exhibits distension. He exhibits no mass. There is no tenderness.  Musculoskeletal: Normal range of motion.  Neurological: He is alert and oriented to person, place, and time.  Skin: Skin is warm and dry.  Psychiatric: He has a normal mood and affect.     Assessment & Plan:   1. Alcoholic cirrhosis of liver with  ascites ([place]) Sober from alcohol and commended on this Patient advised to keep a close eye on his blood pressure to prevent hypotension; might need to reduce Lasix to 20 mg in the event of hypotension - furosemide (LASIX) 40 MG tablet; Take 1 tablet (40 mg total) daily by mouth.  Dispense: 30 tablet; Refill: 5 - propranolol (INDERAL) 10 MG tablet; Take 0.5 tablets (5 mg total) 3 (three) times daily by mouth.  Dispense: 90 tablet; Refill: 3 - CMP14+EGFR - CBC with Differential/Platelet  2. Major depressive disorder with single episode, in full remission ([name]) Controlled - citalopram (CELEXA) 40 MG tablet; Take 1 tablet (40 mg total) daily by mouth.  Dispense: 30 tablet; Refill: 5  3. Other insomnia Controlled - traZODone (DESYREL) 100 MG tablet; Take 1 tablet (100 mg total) at bedtime as needed by mouth. for insomnia  Dispense: 30 tablet; Refill: 5  4. Need for influenza vaccination Flu shot administered  Meds ordered this encounter  Medications  . furosemide (LASIX) 40 MG tablet    Sig: Take 1 tablet (40 mg total) daily by mouth.    Dispense:  30 tablet    Refill:  5  . citalopram (CELEXA) 40 MG tablet    Sig: Take 1 tablet (40 mg total) daily by mouth.    Dispense:  30 tablet    Refill:  5  . propranolol (INDERAL) 10 MG tablet    Sig: Take 0.5 tablets (5 mg total) 3 (three) times daily by mouth.    Dispense:  90 tablet    Refill:  3  . traZODone (DESYREL) 100 MG tablet    Sig: Take 1 tablet (100 mg total) at bedtime as needed by mouth. for insomnia    Dispense:  30 tablet    Refill:  5    Follow-up: Follow-up on Cirrhosis  Arnoldo Morale MD

## 2017-10-28 ENCOUNTER — Encounter: Payer: Self-pay | Admitting: Family Medicine

## 2017-10-29 ENCOUNTER — Other Ambulatory Visit: Payer: Self-pay | Admitting: Family Medicine

## 2017-10-29 DIAGNOSIS — K7031 Alcoholic cirrhosis of liver with ascites: Secondary | ICD-10-CM

## 2017-10-29 DIAGNOSIS — G4709 Other insomnia: Secondary | ICD-10-CM

## 2017-10-29 DIAGNOSIS — F325 Major depressive disorder, single episode, in full remission: Secondary | ICD-10-CM

## 2017-10-29 MED ORDER — PROPRANOLOL HCL 10 MG PO TABS: 5.0000 mg | ORAL_TABLET | Freq: Three times a day (TID) | ORAL | 3 refills | Status: DC

## 2017-10-29 MED ORDER — FUROSEMIDE 40 MG PO TABS: 40.0000 mg | ORAL_TABLET | Freq: Every day | ORAL | 5 refills | Status: DC

## 2017-10-29 MED ORDER — TRAZODONE HCL 100 MG PO TABS: 100.0000 mg | ORAL_TABLET | Freq: Every evening | ORAL | 5 refills | Status: DC | PRN

## 2017-10-29 MED ORDER — CITALOPRAM HYDROBROMIDE 40 MG PO TABS: 40.0000 mg | ORAL_TABLET | Freq: Every day | ORAL | 5 refills | Status: DC

## 2017-12-15 ENCOUNTER — Ambulatory Visit: Payer: Self-pay | Attending: Family Medicine | Admitting: Family Medicine

## 2017-12-15 ENCOUNTER — Encounter: Payer: Self-pay | Admitting: Family Medicine

## 2017-12-15 ENCOUNTER — Other Ambulatory Visit: Payer: Self-pay

## 2017-12-15 VITALS — BP 95/67 | HR 60 | Temp 97.8°F | Wt 167.4 lb

## 2017-12-15 DIAGNOSIS — K3189 Other diseases of stomach and duodenum: Secondary | ICD-10-CM | POA: Insufficient documentation

## 2017-12-15 DIAGNOSIS — D696 Thrombocytopenia, unspecified: Secondary | ICD-10-CM

## 2017-12-15 DIAGNOSIS — I10 Essential (primary) hypertension: Secondary | ICD-10-CM | POA: Insufficient documentation

## 2017-12-15 DIAGNOSIS — F325 Major depressive disorder, single episode, in full remission: Secondary | ICD-10-CM

## 2017-12-15 DIAGNOSIS — Z79899 Other long term (current) drug therapy: Secondary | ICD-10-CM | POA: Insufficient documentation

## 2017-12-15 DIAGNOSIS — M79672 Pain in left foot: Secondary | ICD-10-CM | POA: Insufficient documentation

## 2017-12-15 DIAGNOSIS — F329 Major depressive disorder, single episode, unspecified: Secondary | ICD-10-CM | POA: Insufficient documentation

## 2017-12-15 DIAGNOSIS — G4700 Insomnia, unspecified: Secondary | ICD-10-CM | POA: Insufficient documentation

## 2017-12-15 DIAGNOSIS — R9431 Abnormal electrocardiogram [ECG] [EKG]: Secondary | ICD-10-CM

## 2017-12-15 DIAGNOSIS — F419 Anxiety disorder, unspecified: Secondary | ICD-10-CM | POA: Insufficient documentation

## 2017-12-15 DIAGNOSIS — G4709 Other insomnia: Secondary | ICD-10-CM

## 2017-12-15 DIAGNOSIS — K766 Portal hypertension: Secondary | ICD-10-CM | POA: Insufficient documentation

## 2017-12-15 DIAGNOSIS — M722 Plantar fascial fibromatosis: Secondary | ICD-10-CM

## 2017-12-15 DIAGNOSIS — Z86718 Personal history of other venous thrombosis and embolism: Secondary | ICD-10-CM | POA: Insufficient documentation

## 2017-12-15 DIAGNOSIS — K7031 Alcoholic cirrhosis of liver with ascites: Secondary | ICD-10-CM

## 2017-12-15 DIAGNOSIS — K449 Diaphragmatic hernia without obstruction or gangrene: Secondary | ICD-10-CM | POA: Insufficient documentation

## 2017-12-15 DIAGNOSIS — F1910 Other psychoactive substance abuse, uncomplicated: Secondary | ICD-10-CM | POA: Insufficient documentation

## 2017-12-15 MED ORDER — DICLOFENAC SODIUM 1 % TD GEL: 4.0000 g | Freq: Four times a day (QID) | TRANSDERMAL | 0 refills | Status: DC

## 2017-12-15 MED ORDER — FUROSEMIDE 40 MG PO TABS: 40.0000 mg | ORAL_TABLET | Freq: Every day | ORAL | 5 refills | Status: DC

## 2017-12-15 MED ORDER — FLUOXETINE HCL 40 MG PO CAPS: 40.0000 mg | ORAL_CAPSULE | Freq: Every day | ORAL | 3 refills | Status: DC

## 2017-12-15 MED ORDER — TRAZODONE HCL 100 MG PO TABS: 100.0000 mg | ORAL_TABLET | Freq: Every evening | ORAL | 5 refills | Status: DC | PRN

## 2017-12-15 MED ORDER — PROPRANOLOL HCL 10 MG PO TABS: 5.0000 mg | ORAL_TABLET | Freq: Three times a day (TID) | ORAL | 3 refills | Status: DC

## 2017-12-15 NOTE — Patient Instructions (Signed)

## 2017-12-15 NOTE — Progress Notes (Signed)
Subjective:  Patient ID: Kenneth [name], male    DOB: 11/17/1960  Age: 57 y.o. MRN: [id number]  CC: Hypertension and Foot Pain   HPI Kenneth [name] is a 57 year old male with a history of alcoholic cirrhosis of the liver , thrombocytopenia, previous history of SMA thrombosis , depression here for a follow up visit.  He is doing well on his medications and denies pedal edema, increased abdominal swelling, yellowing of his eyes and has abstained from alcohol for almost 2 years. He denies reflux, nausea vomiting, abdominal pain, diarrhea, constipation, bruising or bleeding. Depression and insomnia are controlled and he denies suicidal ideations or intents.  He complains of left heel pain which lasted for about four days a week ago and then resolved spontaneously. Pain was worse on waking up and improved as the day progressed. No preceding history of trauma.  Past Medical History:  Diagnosis Date  . Alcohol abuse   . Anxiety   . Blood clot in vein   . Cirrhosis ([place])   . Depression   . Esophageal varices ([place]) 09/02/2014  . H/O [facility] spotted fever    age 89 or 25  . Hiatal hernia 09/02/2014  . Polysubstance abuse ([place])   . Portal hypertension with esophageal varices (HCC) 09/02/2014  . Portal hypertensive gastropathy ([place]) 09/02/2014  . Thrombocytopenia ([place]) 09/02/2014  . Upper GI bleed 08/31/2014    Past Surgical History:  Procedure Laterality Date  . ESOPHAGOGASTRODUODENOSCOPY Left 09/01/2014   Procedure: ESOPHAGOGASTRODUODENOSCOPY (EGD);  Surgeon: Juanita Craver, MD;  Location: [facility];  Service: Endoscopy;  Laterality: Left;  . EYE SURGERY Left    age 63    Allergies  Allergen Reactions  . Latex Swelling and Rash     Outpatient Medications Prior to Visit  Medication Sig Dispense Refill  . ondansetron (ZOFRAN) 4 MG tablet TAKE 1 TABLET BY MOUTH 2 TIMES DAILY AS NEEDED FOR NAUSEA OR VOMITING. 30 tablet 3  . citalopram (CELEXA) 40 MG tablet Take 1 tablet (40 mg  total) by mouth daily. 30 tablet 5  . furosemide (LASIX) 40 MG tablet Take 1 tablet (40 mg total) by mouth daily. 30 tablet 5  . propranolol (INDERAL) 10 MG tablet Take 0.5 tablets (5 mg total) by mouth 3 (three) times daily. 90 tablet 3  . traZODone (DESYREL) 100 MG tablet Take 1 tablet (100 mg total) by mouth at bedtime as needed. for insomnia 30 tablet 5  . Multiple Vitamin (MULTIVITAMIN WITH MINERALS) TABS tablet Take 0.5 tablets by mouth as needed.      No facility-administered medications prior to visit.     ROS Review of Systems  Constitutional: Negative for activity change and appetite change.  HENT: Negative for sinus pressure and sore throat.   Eyes: Negative for visual disturbance.  Respiratory: Negative for cough, chest tightness and shortness of breath.   Cardiovascular: Negative for chest pain and leg swelling.  Gastrointestinal: Negative for abdominal distention, abdominal pain, constipation and diarrhea.  Endocrine: Negative.   Genitourinary: Negative for dysuria.  Musculoskeletal:       See hpi  Skin: Negative for rash.  Allergic/Immunologic: Negative.   Neurological: Negative for weakness, light-headedness and numbness.  Psychiatric/Behavioral: Negative for dysphoric mood and suicidal ideas.    Objective:  BP 95/67   Pulse 60   Temp 97.8 F (36.6 C) (Oral)   Wt 167 lb 6.4 oz (75.9 kg)   SpO2 99%   BMI 26.22 kg/m   BP/Weight 12/15/2017 09/08/2017 05/21/2017  Systolic BP 95 237 96  Diastolic BP 67 71 69  Wt. (Lbs) 167.4 169 157  BMI 26.22 26.47 24.59      Physical Exam  Constitutional: He is oriented to person, place, and time. He appears well-developed and well-nourished.  Cardiovascular: Normal rate, normal heart sounds and intact distal pulses.  No murmur heard. Pulmonary/Chest: Effort normal and breath sounds normal. He has no wheezes. He has no rales. He exhibits no tenderness.  Abdominal: Soft. Bowel sounds are normal. He exhibits no distension  and no mass. There is no tenderness.  Musculoskeletal: Normal range of motion. He exhibits no tenderness (no TTP of left heel or on flexion).  Neurological: He is alert and oriented to person, place, and time.  Skin: Skin is warm and dry.  Psychiatric: He has a normal mood and affect.     CMP Latest Ref Rng & Units 09/08/2017 05/21/2017 03/17/2017  Glucose 65 - 99 mg/dL 89 96 89  BUN 6 - 24 mg/dL '7 7 9  ' Creatinine 0.76 - 1.27 mg/dL 0.72(L) 0.71(L) 0.73(L)  Sodium 134 - 144 mmol/L 144 143 142  Potassium 3.5 - 5.2 mmol/L 4.2 4.3 4.2  Chloride 96 - 106 mmol/L 105 105 105  CO2 20 - 29 mmol/L '25 24 25  ' Calcium 8.7 - 10.2 mg/dL 9.1 9.1 9.0  Total Protein 6.0 - 8.5 g/dL 7.2 7.1 7.1  Total Bilirubin 0.0 - 1.2 mg/dL 1.0 0.6 0.7  Alkaline Phos 39 - 117 IU/L 116 93 107  AST 0 - 40 IU/L 45(H) 38 39  ALT 0 - 44 IU/L '30 25 28    ' CBC    Component Value Date/Time   WBC 3.8 09/08/2017 1609   WBC 4.3 12/18/2016 1149   RBC 4.24 09/08/2017 1609   RBC 4.49 12/18/2016 1149   HGB 13.1 09/08/2017 1609   HCT 38.6 09/08/2017 1609   PLT 64 (LL) 09/08/2017 1609   MCV 91 09/08/2017 1609   MCH 30.9 09/08/2017 1609   MCH 31.0 12/18/2016 1149   MCHC 33.9 09/08/2017 1609   MCHC 33.8 12/18/2016 1149   RDW 14.3 09/08/2017 1609   LYMPHSABS 0.7 09/08/2017 1609   MONOABS 774 12/18/2016 1149   EOSABS 0.1 09/08/2017 1609   BASOSABS 0.0 09/08/2017 1609    Assessment & Plan:   1. Alcoholic cirrhosis of liver with ascites (HCC) Alcohol free Cirrhosis is stable - furosemide (LASIX) 40 MG tablet; Take 1 tablet (40 mg total) by mouth daily.  Dispense: 30 tablet; Refill: 5 - propranolol (INDERAL) 10 MG tablet; Take 0.5 tablets (5 mg total) by mouth 3 (three) times daily.  Dispense: 90 tablet; Refill: 3 - CBC with Differential/Platelet - CMP14+EGFR  2. Other insomnia Controlled - traZODone (DESYREL) 100 MG tablet; Take 1 tablet (100 mg total) by mouth at bedtime as needed. for insomnia  Dispense: 30 tablet;  Refill: 5  3. Thrombocytopenia ([place]) Secondary to alcoholic liver disease Last platelets were 64 Repeat CBC today  4. Plantar fasciitis Advised to use insoles - diclofenac sodium (VOLTAREN) 1 % GEL; Apply 4 g topically 4 (four) times daily.  Dispense: 100 g; Refill: 0  5. Prolonged Q-T interval on ECG QTc of 461 Substitute Citalopram with Prozac Will monitor QTc and consider discontinuing Trazodone if it does not trend down  6. Major depressive disorder with single episode, in full remission ([place]) Switch from Celexa to Prozac due to Prolonged QTc - FLUoxetine (PROZAC) 40 MG capsule; Take 1 capsule (40 mg total) by mouth  daily.  Dispense: 30 capsule; Refill: 3   Meds ordered this encounter  Medications  . furosemide (LASIX) 40 MG tablet    Sig: Take 1 tablet (40 mg total) by mouth daily.    Dispense:  30 tablet    Refill:  5  . propranolol (INDERAL) 10 MG tablet    Sig: Take 0.5 tablets (5 mg total) by mouth 3 (three) times daily.    Dispense:  90 tablet    Refill:  3  . traZODone (DESYREL) 100 MG tablet    Sig: Take 1 tablet (100 mg total) by mouth at bedtime as needed. for insomnia    Dispense:  30 tablet    Refill:  5  . diclofenac sodium (VOLTAREN) 1 % GEL    Sig: Apply 4 g topically 4 (four) times daily.    Dispense:  100 g    Refill:  0  . FLUoxetine (PROZAC) 40 MG capsule    Sig: Take 1 capsule (40 mg total) by mouth daily.    Dispense:  30 capsule    Refill:  3    Discontinue Celexa    Follow-up: Return in about 3 months (around 03/14/2018) for follow up of chronic medical conditions.   Charlott Rakes MD

## 2017-12-16 LAB — CMP14+EGFR
A/G RATIO: 1.1 — AB (ref 1.2–2.2)
ALBUMIN: 3.9 g/dL (ref 3.5–5.5)
ALT: 24 IU/L (ref 0–44)
AST: 41 IU/L — ABNORMAL HIGH (ref 0–40)
Alkaline Phosphatase: 101 IU/L (ref 39–117)
BUN/Creatinine Ratio: 9 (ref 9–20)
BUN: 7 mg/dL (ref 6–24)
Bilirubin Total: 1 mg/dL (ref 0.0–1.2)
CALCIUM: 9 mg/dL (ref 8.7–10.2)
CO2: 26 mmol/L (ref 20–29)
Chloride: 103 mmol/L (ref 96–106)
Creatinine, Ser: 0.78 mg/dL (ref 0.76–1.27)
GFR, EST AFRICAN AMERICAN: 117 mL/min/{1.73_m2} (ref >59)
GFR, EST NON AFRICAN AMERICAN: 101 mL/min/{1.73_m2} (ref >59)
GLOBULIN, TOTAL: 3.7 g/dL (ref 1.5–4.5)
Glucose: 100 mg/dL — ABNORMAL HIGH (ref 65–99)
POTASSIUM: 4 mmol/L (ref 3.5–5.2)
Sodium: 143 mmol/L (ref 134–144)
TOTAL PROTEIN: 7.6 g/dL (ref 6.0–8.5)

## 2017-12-16 LAB — CBC WITH DIFFERENTIAL/PLATELET
Basophils Absolute: 0 10*3/uL (ref 0.0–0.2)
Basos: 0 %
EOS (ABSOLUTE): 0.2 10*3/uL (ref 0.0–0.4)
Eos: 4 %
HEMOGLOBIN: 13.4 g/dL (ref 13.0–17.7)
Hematocrit: 39.5 % (ref 37.5–51.0)
IMMATURE GRANS (ABS): 0 10*3/uL (ref 0.0–0.1)
Immature Granulocytes: 0 %
LYMPHS: 13 %
Lymphocytes Absolute: 0.6 10*3/uL — ABNORMAL LOW (ref 0.7–3.1)
MCH: 31.2 pg (ref 26.6–33.0)
MCHC: 33.9 g/dL (ref 31.5–35.7)
MCV: 92 fL (ref 79–97)
MONOCYTES: 20 %
Monocytes Absolute: 1 10*3/uL — ABNORMAL HIGH (ref 0.1–0.9)
NEUTROS ABS: 3.1 10*3/uL (ref 1.4–7.0)
Neutrophils: 63 %
Platelets: 67 10*3/uL — CL (ref 150–379)
RBC: 4.3 x10E6/uL (ref 4.14–5.80)
RDW: 14.7 % (ref 12.3–15.4)
WBC: 4.9 10*3/uL (ref 3.4–10.8)

## 2018-02-25 ENCOUNTER — Ambulatory Visit (HOSPITAL_COMMUNITY)
Admission: RE | Admit: 2018-02-25 | Discharge: 2018-02-25 | Disposition: A | Discharge status: Home / Self Care | Payer: 59 | Source: Ambulatory Visit | Attending: Physician Assistant | Admitting: Physician Assistant

## 2018-02-25 ENCOUNTER — Telehealth: Payer: Self-pay

## 2018-02-25 ENCOUNTER — Ambulatory Visit: Payer: 59 | Attending: Family Medicine | Admitting: Physician Assistant

## 2018-02-25 VITALS — BP 93/61 | HR 56 | Temp 98.3°F | Resp 16 | Ht 67.0 in | Wt 163.0 lb

## 2018-02-25 DIAGNOSIS — F329 Major depressive disorder, single episode, unspecified: Secondary | ICD-10-CM | POA: Diagnosis not present

## 2018-02-25 DIAGNOSIS — K766 Portal hypertension: Secondary | ICD-10-CM | POA: Diagnosis not present

## 2018-02-25 DIAGNOSIS — K8020 Calculus of gallbladder without cholecystitis without obstruction: Secondary | ICD-10-CM | POA: Insufficient documentation

## 2018-02-25 DIAGNOSIS — Z79899 Other long term (current) drug therapy: Secondary | ICD-10-CM | POA: Insufficient documentation

## 2018-02-25 DIAGNOSIS — R1900 Intra-abdominal and pelvic swelling, mass and lump, unspecified site: Secondary | ICD-10-CM | POA: Diagnosis present

## 2018-02-25 DIAGNOSIS — K7031 Alcoholic cirrhosis of liver with ascites: Secondary | ICD-10-CM | POA: Diagnosis not present

## 2018-02-25 DIAGNOSIS — R0602 Shortness of breath: Secondary | ICD-10-CM | POA: Diagnosis not present

## 2018-02-25 DIAGNOSIS — K3189 Other diseases of stomach and duodenum: Secondary | ICD-10-CM | POA: Diagnosis not present

## 2018-02-25 DIAGNOSIS — F419 Anxiety disorder, unspecified: Secondary | ICD-10-CM | POA: Insufficient documentation

## 2018-02-25 DIAGNOSIS — K449 Diaphragmatic hernia without obstruction or gangrene: Secondary | ICD-10-CM | POA: Diagnosis not present

## 2018-02-25 DIAGNOSIS — R161 Splenomegaly, not elsewhere classified: Secondary | ICD-10-CM | POA: Insufficient documentation

## 2018-02-25 LAB — POCT URINALYSIS DIPSTICK
BILIRUBIN UA: NEGATIVE
Blood, UA: NEGATIVE
GLUCOSE UA: NEGATIVE
Ketones, UA: NEGATIVE
LEUKOCYTES UA: NEGATIVE
Nitrite, UA: NEGATIVE
Protein, UA: NEGATIVE
SPEC GRAV UA: 1.01 (ref 1.010–1.025)
Urobilinogen, UA: 0.2 E.U./dL
pH, UA: 7 (ref 5.0–8.0)

## 2018-02-25 NOTE — Progress Notes (Signed)
Pt. Is here for his swelling abdomen.  Pt stated there is a little pain with it. Pt. Stated he think is due to switching Celexa to Prozac.  Last bowel movement was yesterday.

## 2018-02-25 NOTE — Telephone Encounter (Signed)
-----   Message from Angela M McClung, PA-C sent at 02/25/2018  4:19 PM EDT ----- Please call patient.  Kenneth [name], I would like for your to go over to the Emergency department at either [place] or [place].  The Ultrasound does show a significant amount of fluid build up that needs to be drained.  Your spleen is also enlarged.  Further workup needs to be done that we are unable to do in our office.  Thank you, Angela McClung, PA-C 

## 2018-02-25 NOTE — Telephone Encounter (Signed)
CMA attempt to call patient to inform on lab results and to head to the emergency department.  No answer and left a message for patient.  If patient call back, please inform:  Please call patient.  Kenneth [name], I would like for your to go over to the Emergency department at either [facility] or [facility] hospital.  The Ultrasound does show a significant amount of fluid build up that needs to be drained.  Your spleen is also enlarged.  Further workup needs to be done that we are unable to do in our office.  Thank you, Georgian Co, PA-C

## 2018-02-25 NOTE — Progress Notes (Signed)
Kenneth [name], is a 57 y.o. male  ZOX:[id number]  [id number]:[id number]  DOB - 01-18-1961  Subjective:  Chief Complaint and HPI: Kenneth [name] is a 57 y.o. male here today with worsening abdominal swelling for about 1 month.  He feels it may be related to switching medications to prozac.  He has a h/o alcoholic cirrhosis and is sober for 2 years.  Mild SOB.  No CP.  No abdominal pain; just uncomfortable.  No f/c.  Feels tired but otherwise ok.  No tylenol products  Reviewed case with Dr Alvis Lemmings.    ROS:   Constitutional:  No f/c, No night sweats, No unexplained weight loss. EENT:  No vision changes, No blurry vision, No hearing changes. No mouth, throat, or ear problems.  Respiratory: No cough, No SOB Cardiac: No CP, no palpitations GI:  No abd pain, No N/V/D. GU: No Urinary s/sx Musculoskeletal: No joint pain Neuro: No headache, no dizziness, no motor weakness.  Skin: No rash Endocrine:  No polydipsia. No polyuria.  Psych: Denies SI/HI  No problems updated.  ALLERGIES: Allergies  Allergen Reactions  . Latex Swelling and Rash    PAST MEDICAL HISTORY: Past Medical History:  Diagnosis Date  . Alcohol abuse   . Anxiety   . Blood clot in vein   . Cirrhosis (HCC)   . Depression   . Esophageal varices (HCC) 09/02/2014  . H/O [facility] spotted fever    age 69 or 24  . Hiatal hernia 09/02/2014  . Polysubstance abuse (HCC)   . Portal hypertension with esophageal varices (HCC) 09/02/2014  . Portal hypertensive gastropathy (HCC) 09/02/2014  . Thrombocytopenia (HCC) 09/02/2014  . Upper GI bleed 08/31/2014    MEDICATIONS AT HOME: Prior to Admission medications   Medication Sig Start Date End Date Taking? Authorizing Provider  diclofenac sodium (VOLTAREN) 1 % GEL Apply 4 g topically 4 (four) times daily. 12/15/17  Yes [name], MD  FLUoxetine (PROZAC) 40 MG capsule Take 1 capsule (40 mg total) by mouth daily. 12/15/17  Yes [name], MD  furosemide (LASIX) 40 MG tablet  Take 1 tablet (40 mg total) by mouth daily. 12/15/17  Yes Newlin, Enobong, MD  ondansetron (ZOFRAN) 4 MG tablet TAKE 1 TABLET BY MOUTH 2 TIMES DAILY AS NEEDED FOR NAUSEA OR VOMITING. 03/17/17  Yes Newlin, Enobong, MD  propranolol (INDERAL) 10 MG tablet Take 0.5 tablets (5 mg total) by mouth 3 (three) times daily. 12/15/17  Yes [name], MD  traZODone (DESYREL) 100 MG tablet Take 1 tablet (100 mg total) by mouth at bedtime as needed. for insomnia 12/15/17  Yes [name], MD  Multiple Vitamin (MULTIVITAMIN WITH MINERALS) TABS tablet Take 0.5 tablets by mouth as needed.     [provider]     Objective:  EXAM:   Vitals:   02/25/18 1054  BP: 93/61  Pulse: (!) 56  Resp: 16  Temp: 98.3 F (36.8 C)  TempSrc: Oral  SpO2: 93%  Weight: 163 lb (73.9 kg)  Height:  (1.702 m)    General appearance : A&OX3. NAD. Non-toxic-appearing HEENT: Atraumatic and Normocephalic.  PERRLA. EOM intact.  Sclera is white and non-icteric. TM clear B. Mouth-MMM, post pharynx WNL w/o erythema, No PND. Neck: supple, no JVD. No cervical lymphadenopathy. No thyromegaly Chest/Lungs:  Breathing-non-labored, Good air entry bilaterally, breath sounds normal without rales, rhonchi, or wheezing  CVS: S1 S2 regular, 2/6 murmur, no gallops, rubs  Abdomen: Bowel sounds present, abdomen is distended and there is a  positive fluid wave.  There is no guarding or tenderness.  Due to swelling, I am unable to adequately assess for organomegaly.   Extremities: Bilateral Lower Ext shows no edema, both legs are warm to touch with = pulse throughout Neurology:  CN II-XII grossly intact, Non focal.   Psych:  TP linear. J/I WNL. Normal speech. Appropriate eye contact and affect.  Skin:  No Rash.  No jaundice  Data Review No results found for: HGBA1C   Assessment & Plan   1. Ascites due to alcoholic cirrhosis (HCC) ?due to medication changes?  - Urinalysis Dipstick - FAST [place]; Future-asap - Comprehensive  metabolic panel - CBC with Differential/Platelet To ED if worsens  Patient have been counseled extensively about nutrition and exercise  Return in about 2 weeks (around 03/11/2018) for Dr Alvis Lemmings; f/up ascites.  The patient was given clear instructions to go to ER or return to medical center if symptoms don't improve, worsen or new problems develop. The patient verbalized understanding. The patient was told to call to get lab results if they haven't heard anything in the next week.     Georgian Co, PA-C [facility], [place] [phone]   02/25/2018, 11:19 AM

## 2018-02-26 ENCOUNTER — Observation Stay (HOSPITAL_COMMUNITY)
Admission: EM | Admit: 2018-02-26 | Discharge: 2018-02-27 | Disposition: A | Discharge status: Home / Self Care | Payer: 59 | Attending: Family Medicine | Admitting: Family Medicine

## 2018-02-26 ENCOUNTER — Other Ambulatory Visit: Payer: Self-pay

## 2018-02-26 ENCOUNTER — Encounter (HOSPITAL_COMMUNITY): Payer: Self-pay

## 2018-02-26 ENCOUNTER — Telehealth: Payer: Self-pay

## 2018-02-26 DIAGNOSIS — D696 Thrombocytopenia, unspecified: Secondary | ICD-10-CM | POA: Insufficient documentation

## 2018-02-26 DIAGNOSIS — F419 Anxiety disorder, unspecified: Secondary | ICD-10-CM | POA: Diagnosis not present

## 2018-02-26 DIAGNOSIS — Z79899 Other long term (current) drug therapy: Secondary | ICD-10-CM | POA: Insufficient documentation

## 2018-02-26 DIAGNOSIS — F329 Major depressive disorder, single episode, unspecified: Secondary | ICD-10-CM | POA: Insufficient documentation

## 2018-02-26 DIAGNOSIS — K7030 Alcoholic cirrhosis of liver without ascites: Secondary | ICD-10-CM | POA: Diagnosis present

## 2018-02-26 DIAGNOSIS — R140 Abdominal distension (gaseous): Secondary | ICD-10-CM | POA: Diagnosis present

## 2018-02-26 DIAGNOSIS — K429 Umbilical hernia without obstruction or gangrene: Secondary | ICD-10-CM | POA: Insufficient documentation

## 2018-02-26 DIAGNOSIS — F17210 Nicotine dependence, cigarettes, uncomplicated: Secondary | ICD-10-CM | POA: Diagnosis not present

## 2018-02-26 DIAGNOSIS — K7031 Alcoholic cirrhosis of liver with ascites: Secondary | ICD-10-CM | POA: Diagnosis not present

## 2018-02-26 LAB — CBC WITH DIFFERENTIAL/PLATELET
BASOS ABS: 0 10*3/uL (ref 0.0–0.2)
Basos: 0 %
EOS (ABSOLUTE): 0.2 10*3/uL (ref 0.0–0.4)
Eos: 4 %
Hematocrit: 38.5 % (ref 37.5–51.0)
Hemoglobin: 12.9 g/dL — ABNORMAL LOW (ref 13.0–17.7)
Immature Grans (Abs): 0 10*3/uL (ref 0.0–0.1)
Immature Granulocytes: 0 %
LYMPHS ABS: 0.6 10*3/uL — AB (ref 0.7–3.1)
Lymphs: 13 %
MCH: 30.6 pg (ref 26.6–33.0)
MCHC: 33.5 g/dL (ref 31.5–35.7)
MCV: 91 fL (ref 79–97)
MONOCYTES: 25 %
MONOS ABS: 1.2 10*3/uL — AB (ref 0.1–0.9)
NEUTROS ABS: 2.7 10*3/uL (ref 1.4–7.0)
Neutrophils: 58 %
PLATELETS: 86 10*3/uL — AB (ref 150–379)
RBC: 4.22 x10E6/uL (ref 4.14–5.80)
RDW: 15.2 % (ref 12.3–15.4)
WBC: 4.7 10*3/uL (ref 3.4–10.8)

## 2018-02-26 LAB — URINALYSIS, ROUTINE W REFLEX MICROSCOPIC
BILIRUBIN URINE: NEGATIVE
Glucose, UA: NEGATIVE mg/dL
HGB URINE DIPSTICK: NEGATIVE
Ketones, ur: NEGATIVE mg/dL
Leukocytes, UA: NEGATIVE
Nitrite: NEGATIVE
PROTEIN: NEGATIVE mg/dL
SPECIFIC GRAVITY, URINE: 1.019 (ref 1.005–1.030)
pH: 7 (ref 5.0–8.0)

## 2018-02-26 LAB — COMPREHENSIVE METABOLIC PANEL
ALBUMIN: 3.5 g/dL (ref 3.5–5.5)
ALBUMIN: 3.2 g/dL — AB (ref 3.5–5.0)
ALK PHOS: 139 IU/L — AB (ref 39–117)
ALK PHOS: 124 U/L (ref 38–126)
ALT: 31 IU/L (ref 0–44)
ALT: 35 U/L (ref 17–63)
AST: 54 IU/L — AB (ref 0–40)
AST: 59 U/L — ABNORMAL HIGH (ref 15–41)
Albumin/Globulin Ratio: 1 — ABNORMAL LOW (ref 1.2–2.2)
Anion gap: 9 (ref 5–15)
BILIRUBIN TOTAL: 1 mg/dL (ref 0.0–1.2)
BUN/Creatinine Ratio: 5 — ABNORMAL LOW (ref 9–20)
BUN: 4 mg/dL — AB (ref 6–24)
BUN: 6 mg/dL (ref 6–20)
CALCIUM: 8.4 mg/dL — AB (ref 8.9–10.3)
CHLORIDE: 104 mmol/L (ref 96–106)
CO2: 25 mmol/L (ref 20–29)
CO2: 25 mmol/L (ref 22–32)
CREATININE: 0.68 mg/dL (ref 0.61–1.24)
Calcium: 8.5 mg/dL — ABNORMAL LOW (ref 8.7–10.2)
Chloride: 105 mmol/L (ref 101–111)
Creatinine, Ser: 0.74 mg/dL — ABNORMAL LOW (ref 0.76–1.27)
GFR calc Af Amer: 119 mL/min/{1.73_m2} (ref >59)
GFR calc Af Amer: >60 mL/min (ref >60)
GFR calc non Af Amer: 103 mL/min/{1.73_m2} (ref >59)
GFR calc non Af Amer: >60 mL/min (ref >60)
GLUCOSE: 48 mg/dL — AB (ref 65–99)
GLUCOSE: 87 mg/dL (ref 65–99)
Globulin, Total: 3.5 g/dL (ref 1.5–4.5)
Potassium: 3.8 mmol/L (ref 3.5–5.2)
Potassium: 3.6 mmol/L (ref 3.5–5.1)
SODIUM: 141 mmol/L (ref 134–144)
SODIUM: 139 mmol/L (ref 135–145)
Total Bilirubin: 2 mg/dL — ABNORMAL HIGH (ref 0.3–1.2)
Total Protein: 7 g/dL (ref 6.0–8.5)
Total Protein: 7.1 g/dL (ref 6.5–8.1)

## 2018-02-26 LAB — CBC
HCT: 38.3 % — ABNORMAL LOW (ref 39.0–52.0)
Hemoglobin: 13 g/dL (ref 13.0–17.0)
MCH: 31.1 pg (ref 26.0–34.0)
MCHC: 33.9 g/dL (ref 30.0–36.0)
MCV: 91.6 fL (ref 78.0–100.0)
PLATELETS: 85 10*3/uL — AB (ref 150–400)
RBC: 4.18 MIL/uL — ABNORMAL LOW (ref 4.22–5.81)
RDW: 14.8 % (ref 11.5–15.5)
WBC: 6 10*3/uL (ref 4.0–10.5)

## 2018-02-26 LAB — PROTIME-INR
INR: 1.42
PROTHROMBIN TIME: 17.2 s — AB (ref 11.4–15.2)

## 2018-02-26 LAB — APTT: aPTT: 37 seconds — ABNORMAL HIGH (ref 24–36)

## 2018-02-26 LAB — LIPASE, BLOOD: Lipase: 107 U/L — ABNORMAL HIGH (ref 11–51)

## 2018-02-26 MED ORDER — ACETAMINOPHEN 325 MG PO TABS: 650.0000 mg | ORAL_TABLET | Freq: Four times a day (QID) | ORAL | Status: DC | PRN

## 2018-02-26 MED ORDER — NICOTINE 21 MG/24HR TD PT24
21.0000 mg | MEDICATED_PATCH | Freq: Every day | TRANSDERMAL | Status: DC
  Administered 2018-02-26: 21 mg via TRANSDERMAL
  Filled 2018-02-26 (×2): qty 1

## 2018-02-26 MED ORDER — ONDANSETRON HCL 4 MG PO TABS: 4.0000 mg | ORAL_TABLET | Freq: Four times a day (QID) | ORAL | Status: DC | PRN

## 2018-02-26 MED ORDER — ACETAMINOPHEN 650 MG RE SUPP: 650.0000 mg | Freq: Four times a day (QID) | RECTAL | Status: DC | PRN

## 2018-02-26 MED ORDER — TRAZODONE HCL 100 MG PO TABS
100.0000 mg | ORAL_TABLET | Freq: Every evening | ORAL | Status: DC | PRN
  Administered 2018-02-26: 100 mg via ORAL
  Filled 2018-02-26: qty 1

## 2018-02-26 MED ORDER — FUROSEMIDE 40 MG PO TABS
40.0000 mg | ORAL_TABLET | Freq: Every day | ORAL | Status: DC
  Administered 2018-02-27: 40 mg via ORAL
  Filled 2018-02-26: qty 1

## 2018-02-26 MED ORDER — ONDANSETRON HCL 4 MG/2ML IJ SOLN
4.0000 mg | Freq: Four times a day (QID) | INTRAMUSCULAR | Status: DC | PRN
  Administered 2018-02-27: 4 mg via INTRAVENOUS
  Filled 2018-02-26: qty 2

## 2018-02-26 MED ORDER — PROPRANOLOL HCL 10 MG PO TABS
5.0000 mg | ORAL_TABLET | Freq: Three times a day (TID) | ORAL | Status: DC
  Administered 2018-02-26 – 2018-02-27 (×2): 5 mg via ORAL
  Filled 2018-02-26 (×2): qty 1

## 2018-02-26 MED ORDER — FLUOXETINE HCL 20 MG PO CAPS
40.0000 mg | ORAL_CAPSULE | Freq: Every day | ORAL | Status: DC
  Administered 2018-02-27: 40 mg via ORAL
  Filled 2018-02-26: qty 2

## 2018-02-26 MED ORDER — DICLOFENAC SODIUM 1 % TD GEL
4.0000 g | Freq: Four times a day (QID) | TRANSDERMAL | Status: DC
Start: 2018-02-26 — End: 2018-02-27
  Administered 2018-02-27: 4 g via TOPICAL
  Filled 2018-02-26: qty 100

## 2018-02-26 NOTE — ED Notes (Signed)
ED TO INPATIENT HANDOFF REPORT  Name/Age/Gender Kenneth [name] 57 y.o. male  Code Status    Code Status Orders  (From admission, onward)        Start     Ordered   02/26/18 2016  Full code  Continuous     02/26/18 2015    Code Status History    Date Active Date Inactive Code Status Order ID Comments User Context   01/24/2016 1613 01/29/2016 2157 Full Code [id number]  Samella Parr, NP ED   08/31/2014 1756 09/03/2014 1954 Full Code [id number]  Chesley Mires, MD ED      Home/SNF/Other Home  Chief Complaint Needs stomach drained, sent by dr  Level of Care/Admitting Diagnosis ED Disposition    ED Disposition Condition [place]: [facility] [100102]  Level of Care: Med-Surg [16]  Diagnosis: Alcoholic cirrhosis of liver with ascites [facility]) [[id number]]  Admitting Physician: Etta Quill [phone]  Attending Physician: Etta Quill [[id number]]  PT Class (Do Not Modify): Observation [104]  PT Acc Code (Do Not Modify): Observation [10022]       Medical History Past Medical History:  Diagnosis Date  . Alcohol abuse   . Anxiety   . Blood clot in vein   . Cirrhosis ([place])   . Depression   . Esophageal varices ([place]) 09/02/2014  . H/O [facility] spotted fever    age 4 or 10  . Hiatal hernia 09/02/2014  . Polysubstance abuse ([place])   . Portal hypertension with esophageal varices (HCC) 09/02/2014  . Portal hypertensive gastropathy ([place]) 09/02/2014  . Thrombocytopenia ([place]) 09/02/2014  . Upper GI bleed 08/31/2014    Allergies Allergies  Allergen Reactions  . Latex Swelling and Rash    IV Location/Drains/Wounds Patient Lines/Drains/Airways Status   Active Line/Drains/Airways    Name:   Placement date:   Placement time:   Site:   Days:   Peripheral IV 02/26/18 Left Forearm   02/26/18    1932    Forearm   less than 1          Labs/Imaging Results for orders placed or performed during the hospital encounter of 02/26/18 (from the  past 48 hour(s))  Lipase, blood     Status: Abnormal   Collection Time: 02/26/18  4:05 PM  Result Value Ref Range   Lipase 107 (H) 11 - 51 U/L    Comment: Performed at [facility], [place] [address] [place], [place] [address]  Comprehensive metabolic panel     Status: Abnormal   Collection Time: 02/26/18  4:05 PM  Result Value Ref Range   Sodium 139 135 - 145 mmol/L   Potassium 3.6 3.5 - 5.1 mmol/L   Chloride 105 101 - 111 mmol/L   CO2 25 22 - 32 mmol/L   Glucose, Bld 87 65 - 99 mg/dL   BUN 6 6 - 20 mg/dL   Creatinine, Ser 0.68 0.61 - 1.24 mg/dL   Calcium 8.4 (L) 8.9 - 10.3 mg/dL   Total Protein 7.1 6.5 - 8.1 g/dL   Albumin 3.2 (L) 3.5 - 5.0 g/dL   AST 59 (H) 15 - 41 U/L   ALT 35 17 - 63 U/L   Alkaline Phosphatase 124 38 - 126 U/L   Total Bilirubin 2.0 (H) 0.3 - 1.2 mg/dL   GFR calc non Af Amer >60 >60 mL/min   GFR calc Af Amer >60 >60 mL/min    Comment: (NOTE) The  eGFR has been calculated using the CKD EPI equation. This calculation has not been validated in all clinical situations. eGFR's persistently <60 mL/min signify possible Chronic Kidney Disease.    Anion gap 9 5 - 15    Comment: Performed at [facility], [place] [address] [place], [place] [address]  CBC     Status: Abnormal   Collection Time: 02/26/18  4:05 PM  Result Value Ref Range   WBC 6.0 4.0 - 10.5 K/uL   RBC 4.18 (L) 4.22 - 5.81 MIL/uL   Hemoglobin 13.0 13.0 - 17.0 g/dL   HCT 38.3 (L) 39.0 - 52.0 %   MCV 91.6 78.0 - 100.0 fL   MCH 31.1 26.0 - 34.0 pg   MCHC 33.9 30.0 - 36.0 g/dL   RDW 14.8 11.5 - 15.5 %   Platelets 85 (L) 150 - 400 K/uL    Comment: REPEATED TO VERIFY SPECIMEN CHECKED FOR CLOTS PLATELET COUNT CONFIRMED BY SMEAR Performed at [place] [address] [place], [place] [address]   Urinalysis, Routine w reflex microscopic     Status: Abnormal   Collection Time: 02/26/18  4:05 PM  Result Value Ref Range   Color, Urine AMBER (A)  YELLOW    Comment: BIOCHEMICALS MAY BE AFFECTED BY COLOR   APPearance CLEAR CLEAR   Specific Gravity, Urine 1.019 1.005 - 1.030   pH 7.0 5.0 - 8.0   Glucose, UA NEGATIVE NEGATIVE mg/dL   Hgb urine dipstick NEGATIVE NEGATIVE   Bilirubin Urine NEGATIVE NEGATIVE   Ketones, ur NEGATIVE NEGATIVE mg/dL   Protein, ur NEGATIVE NEGATIVE mg/dL   Nitrite NEGATIVE NEGATIVE   Leukocytes, UA NEGATIVE NEGATIVE    Comment: Performed at [facility], [place] [address], [place], [place] [address]  Protime-INR     Status: Abnormal   Collection Time: 02/26/18  4:05 PM  Result Value Ref Range   Prothrombin Time 17.2 (H) 11.4 - 15.2 seconds   INR 1.42     Comment: Performed at [facility], [place] [address] [place], [place] [address]  APTT     Status: Abnormal   Collection Time: 02/26/18  4:05 PM  Result Value Ref Range   aPTT 37 (H) 24 - 36 seconds    Comment:        IF BASELINE aPTT IS ELEVATED, SUGGEST PATIENT RISK ASSESSMENT BE USED TO DETERMINE APPROPRIATE ANTICOAGULANT THERAPY. Performed at [facility], [place] [address], [place],  [address]    US Abdomen Complete  Result Date: 02/25/2018 CLINICAL DATA:  57 year old male with abdominal swelling. Alcoholic cirrhosis, ascites. EXAM: ABDOMEN ULTRASOUND COMPLETE COMPARISON:  CT Abdomen and Pelvis 01/24/2016. FINDINGS: Gallbladder: Mild gallbladder wall thickening of 3 millimeters. A 9 millimeter gallstone is visible in the gallbladder neck (image 7). No sonographic Murphy sign elicited. Common bile duct: Diameter: 2 millimeters, normal. Liver: Atrophied, nodular cirrhotic liver. Heterogeneous echotexture with no discrete liver lesion identified. No intrahepatic biliary ductal dilatation. Portal vein is patent on color Doppler imaging with normal direction of blood flow towards the liver (image 73). IVC: No abnormality visualized. Pancreas: Visualized portion unremarkable. Spleen: Enlarged with  estimated splenic volume 811 mL (normal splenic volume range 83 - 412 mL). No discrete splenic lesion. Right Kidney: Length: 10.9 centimeters. Echogenicity within normal limits. No mass or hydronephrosis visualized. Left Kidney: Length: 10.9 centimeters. Echogenicity within normal limits. No mass or hydronephrosis visualized. Abdominal aorta: Incompletely visualized due to overlying bowel gas, visualized portions within  normal limits. Other findings: Moderate to large volume of ascites, probably increased compared to that demonstrated on the 2017 CT. Fluid is visible in all 4 quadrants. IMPRESSION: 1. Cirrhotic liver with splenomegaly and at least moderate volume ascites. Fluid is visible in all 4 quadrants. 2. Mild cholelithiasis. Mild gallbladder wall thickening is likely reactive due to ascites. Electronically Signed   By: Genevie Ann M.D.   On: 02/25/2018 14:39    Pending Labs Unresulted Labs (From admission, onward)   Start     Ordered   02/26/18 2015  HIV antibody (Routine Testing)  Once,   R     02/26/18 2015      Vitals/Pain Today's Vitals   02/26/18 1547 02/26/18 1755 02/26/18 1801 02/26/18 2011  BP: 108/73  113/69 120/72  Pulse: 62  63 62  Resp: '16  14 18  ' Temp: 98.9 F (37.2 C)     TempSrc: Oral     SpO2: 94%  95% 95%  PainSc:  0-No pain      Isolation Precautions No active isolations  Medications Medications  furosemide (LASIX) tablet 40 mg (has no administration in time range)  propranolol (INDERAL) tablet 5 mg (has no administration in time range)  traZODone (DESYREL) tablet 100 mg (has no administration in time range)  diclofenac sodium (VOLTAREN) 1 % transdermal gel 4 g (has no administration in time range)  FLUoxetine (PROZAC) capsule 40 mg (has no administration in time range)  acetaminophen (TYLENOL) tablet 650 mg (has no administration in time range)    Or  acetaminophen (TYLENOL) suppository 650 mg (has no administration in time range)  ondansetron (ZOFRAN)  tablet 4 mg (has no administration in time range)    Or  ondansetron (ZOFRAN) injection 4 mg (has no administration in time range)  nicotine (NICODERM CQ - dosed in mg/24 hours) patch 21 mg (has no administration in time range)    Mobility walks

## 2018-02-26 NOTE — ED Notes (Signed)
Urine culture sent to lab with UA. 

## 2018-02-26 NOTE — ED Triage Notes (Signed)
Pt reports that he needs a paracentesis. He was seen at [facility] yesterday and they recommended that he come here. Abdomen is distended, but he denies much pain. No N/V/D.

## 2018-02-26 NOTE — ED Provider Notes (Signed)
[facility]-EMERGENCY DEPT Provider Note   CSN: [id number] Arrival date & time: 02/26/18  1515     History   Chief Complaint Chief Complaint  Patient presents with  . Abdominal Distention    HPI Kenneth [name] is a 57 y.o. male.  Patient is a 57 year old male with a history of alcoholic cirrhosis who presents with abdominal distention.  He states that he has had increasing abdominal swelling over the last 3 months or so.  He now feels pressure and some discomfort with his abdominal swelling.  He denies any nausea or vomiting.  He has had some irregular stools with some constipation and loose stools at times.  He denies any fevers.  No urinary symptoms.  He occasionally has some swelling of his legs but none more than normal.  He denies any shortness of breath.  He does have some discomfort around his umbilical hernia that is protruding more given his ascites.  He was seen by his primary care physician yesterday at the [facility].  He had an abdominal ultrasound which showed a large amount of ascites and he was sent here to have his ascites drained.  He states it has been several years since he has had to have a paracentesis.     Past Medical History:  Diagnosis Date  . Alcohol abuse   . Anxiety   . Blood clot in vein   . Cirrhosis (HCC)   . Depression   . Esophageal varices (HCC) 09/02/2014  . H/O [facility] spotted fever    age 52 or 16  . Hiatal hernia 09/02/2014  . Polysubstance abuse (HCC)   . Portal hypertension with esophageal varices (HCC) 09/02/2014  . Portal hypertensive gastropathy (HCC) 09/02/2014  . Thrombocytopenia (HCC) 09/02/2014  . Upper GI bleed 08/31/2014    Patient Active Problem List   Diagnosis Date Noted  . Depression 12/18/2016  . Testosterone deficiency in male 08/11/2016  . Inguinal hernia 02/28/2016  . Insomnia 02/12/2016  . Rectal bleed 01/24/2016  . Umbilical hernia 01/24/2016  . Ascites 01/24/2016  .  SBP (spontaneous bacterial peritonitis) (HCC) 01/24/2016  . Tobacco abuse 01/24/2016  . Gynecomastia, male 01/24/2016  . Abdominal pain, generalized   . Esophageal varices (HCC) 09/02/2014  . Portal hypertension with esophageal varices (HCC) 09/02/2014  . Portal hypertensive gastropathy (HCC) 09/02/2014  . Hiatal hernia 09/02/2014  . Acute blood loss anemia 09/02/2014  . Leukopenia 09/02/2014  . Thrombocytopenia (HCC) 09/02/2014  . Alcoholic cirrhosis of liver with ascites (HCC)   . Alcohol abuse 08/31/2014    Past Surgical History:  Procedure Laterality Date  . ESOPHAGOGASTRODUODENOSCOPY Left 09/01/2014   Procedure: ESOPHAGOGASTRODUODENOSCOPY (EGD);  Surgeon: Charna Elizabeth, MD;  Location: [facility];  Service: Endoscopy;  Laterality: Left;  . EYE SURGERY Left    age 48        Home Medications    Prior to Admission medications   Medication Sig Start Date End Date Taking? Authorizing Provider  diclofenac sodium (VOLTAREN) 1 % GEL Apply 4 g topically 4 (four) times daily. 12/15/17  Yes [name], MD  FLUoxetine (PROZAC) 40 MG capsule Take 1 capsule (40 mg total) by mouth daily. 12/15/17  Yes [name], MD  furosemide (LASIX) 40 MG tablet Take 1 tablet (40 mg total) by mouth daily. 12/15/17  Yes [name], MD  Multiple Vitamin (MULTIVITAMIN WITH MINERALS) TABS tablet Take 0.5 tablets by mouth as needed.    Yes [provider]  ondansetron (ZOFRAN) 4 MG tablet TAKE 1 TABLET BY MOUTH 2 TIMES DAILY AS NEEDED FOR NAUSEA OR VOMITING. 03/17/17  Yes [name], MD  propranolol (INDERAL) 10 MG tablet Take 0.5 tablets (5 mg total) by mouth 3 (three) times daily. 12/15/17  Yes [name], MD  traZODone (DESYREL) 100 MG tablet Take 1 tablet (100 mg total) by mouth at bedtime as needed. for insomnia 12/15/17  Yes [name], MD    Family History Family History  Problem Relation Age of Onset  . Leukemia Father        acute myloid   . Hyperlipidemia Father    . Hypertension Father     Social History Social History   Tobacco Use  . Smoking status: Current Every Day Smoker    Packs/day: 0.50    Years: 2.00    Pack years: 1.00    Types: Cigarettes  . Smokeless tobacco: Never Used  Substance Use Topics  . Alcohol use: No    Comment: no alcohol since 01/23/16  . Drug use: Yes    Types: Marijuana    Comment: 2 months     Allergies   Latex   Review of Systems Review of Systems  Constitutional: Negative for chills, diaphoresis, fatigue and fever.  HENT: Negative for congestion, rhinorrhea and sneezing.   Eyes: Negative.   Respiratory: Negative for cough, chest tightness and shortness of breath.   Cardiovascular: Positive for leg swelling. Negative for chest pain.  Gastrointestinal: Positive for abdominal distention and abdominal pain. Negative for blood in stool, diarrhea, nausea and vomiting.  Genitourinary: Negative for difficulty urinating, flank pain, frequency and hematuria.  Musculoskeletal: Negative for arthralgias and back pain.  Skin: Negative for rash.  Neurological: Negative for dizziness, speech difficulty, weakness, numbness and headaches.     Physical Exam Updated Vital Signs BP 113/69 (BP Location: Left Arm)   Pulse 63   Temp 98.9 F (37.2 C) (Oral)   Resp 14   SpO2 95%   Physical Exam  Constitutional: He is oriented to person, place, and time. He appears well-developed and well-nourished.  HENT:  Head: Normocephalic and atraumatic.  Eyes: Pupils are equal, round, and reactive to light.  Neck: Normal range of motion. Neck supple.  Cardiovascular: Normal rate and regular rhythm.  Murmur heard. Pulmonary/Chest: Effort normal and breath sounds normal. No respiratory distress. He has no wheezes. He has no rales. He exhibits no tenderness.  Abdominal: Soft. Bowel sounds are normal. He exhibits distension (Positive fluid wave). There is no tenderness. There is no rebound and no guarding.  Patient has a  protruding umbilical hernia which is soft and nontender.  It is easily reducible.  Musculoskeletal: Normal range of motion. He exhibits edema.  Trace pitting edema  Lymphadenopathy:    He has no cervical adenopathy.  Neurological: He is alert and oriented to person, place, and time.  Skin: Skin is warm and dry. No rash noted.  Psychiatric: He has a normal mood and affect.     ED Treatments / Results  Labs (all labs ordered are listed, but only abnormal results are displayed) Labs Reviewed  LIPASE, BLOOD - Abnormal; Notable for the following components:      Result Value   Lipase 107 (*)    All other components within normal limits  COMPREHENSIVE METABOLIC PANEL - Abnormal; Notable for the following components:   Calcium 8.4 (*)    Albumin 3.2 (*)    AST 59 (*)    Total Bilirubin 2.0 (*)  All other components within normal limits  CBC - Abnormal; Notable for the following components:   RBC 4.18 (*)    HCT 38.3 (*)    Platelets 85 (*)    All other components within normal limits  URINALYSIS, ROUTINE W REFLEX MICROSCOPIC - Abnormal; Notable for the following components:   Color, Urine AMBER (*)    All other components within normal limits  PROTIME-INR - Abnormal; Notable for the following components:   Prothrombin Time 17.2 (*)    All other components within normal limits  APTT - Abnormal; Notable for the following components:   aPTT 37 (*)    All other components within normal limits    EKG None  Radiology US Abdomen Complete  Result Date: 02/25/2018 CLINICAL DATA:  57 year old male with abdominal swelling. Alcoholic cirrhosis, ascites. EXAM: ABDOMEN ULTRASOUND COMPLETE COMPARISON:  CT Abdomen and Pelvis 01/24/2016. FINDINGS: Gallbladder: Mild gallbladder wall thickening of 3 millimeters. A 9 millimeter gallstone is visible in the gallbladder neck (image 7). No sonographic Murphy sign elicited. Common bile duct: Diameter: 2 millimeters, normal. Liver: Atrophied, nodular  cirrhotic liver. Heterogeneous echotexture with no discrete liver lesion identified. No intrahepatic biliary ductal dilatation. Portal vein is patent on color Doppler imaging with normal direction of blood flow towards the liver (image 73). IVC: No abnormality visualized. Pancreas: Visualized portion unremarkable. Spleen: Enlarged with estimated splenic volume 811 mL (normal splenic volume range 83 - 412 mL). No discrete splenic lesion. Right Kidney: Length: 10.9 centimeters. Echogenicity within normal limits. No mass or hydronephrosis visualized. Left Kidney: Length: 10.9 centimeters. Echogenicity within normal limits. No mass or hydronephrosis visualized. Abdominal aorta: Incompletely visualized due to overlying bowel gas, visualized portions within normal limits. Other findings: Moderate to large volume of ascites, probably increased compared to that demonstrated on the 2017 CT. Fluid is visible in all 4 quadrants. IMPRESSION: 1. Cirrhotic liver with splenomegaly and at least moderate volume ascites. Fluid is visible in all 4 quadrants. 2. Mild cholelithiasis. Mild gallbladder wall thickening is likely reactive due to ascites. Electronically Signed   By: Odessa Fleming M.D.   On: 02/25/2018 14:39    Procedures Procedures (including critical care time)  Medications Ordered in ED Medications - No data to display   Initial Impression / Assessment and Plan / ED Course  I have reviewed the triage vital signs and the nursing notes.  Pertinent labs & imaging results that were available during my care of the patient were reviewed by me and considered in my medical decision making (see chart for details).     Patient is a 57 year old male who presents with ascites.  This is likely from his alcoholic cirrhosis.  I have a low suspicion for peritonitis.  He has no associated abdominal pain other than some soreness around his umbilical hernia.  No fever.  No vomiting.  There is no evidence of incarceration of the  hernia.  His labs are reviewed and are non-concerning.  He has chronic thrombocytopenia.  I spoke with Dr. Julian Reil with the hospitalist service he will admit the patient for paracentesis.  He does have a subtle heart murmur and states that he has been told that he has a murmur in the past.  Final Clinical Impressions(s) / ED Diagnoses   Final diagnoses:  Ascites due to alcoholic cirrhosis (HCC)  Umbilical hernia without obstruction and without gangrene    ED Discharge Orders    None       Rolan Bucco, MD 02/26/18 [phone]

## 2018-02-26 NOTE — Telephone Encounter (Signed)
-----   Message from Anders Simmonds, [place] sent at 02/25/2018  4:19 PM EDT ----- Please call patient.  Mr Cho, I would like for your to go over to the Emergency department at either [facility] or [facility] hospital.  The Ultrasound does show a significant amount of fluid build up that needs to be drained.  Your spleen is also enlarged.  Further workup needs to be done that we are unable to do in our office.  Thank you, Georgian Co, PA-C

## 2018-02-26 NOTE — H&P (Signed)
History and Physical    Kenneth [name] ZOX:[id number] DOB: 08/17/1961 DOA: 02/26/2018  PCP: [name], MD  Patient coming from: Home  I have personally briefly reviewed patient's old medical records in [facility]  Chief Complaint: Ascites  HPI: Kenneth [name] is a 57 y.o. male with medical history significant of EtOH cirrhosis with ascites.  Presents to ED with abdominal distention.  Has had progressively increasing swelling over the past 3 months or so.  Now discomfort due to swelling.  No significant tenderness or pain, no N/V, no fever, no SOB.  Discomfort around umbilical hernia that is protruding more due to ascites.  Sent in from PCP for theraputic paracentesis.  Has had theraputic paracentesis' in past but has been a couple of years since he last needed one.   ED Course: [place] confirms large volume ascites.  INR 1.4.  Tbili 2.  Platelets 85 (chronic thrombocytopenia).   Review of Systems: As per HPI otherwise 10 point review of systems negative.   Past Medical History:  Diagnosis Date  . Alcohol abuse   . Anxiety   . Blood clot in vein   . Cirrhosis (HCC)   . Depression   . Esophageal varices (HCC) 09/02/2014  . H/O [facility] spotted fever    age 89 or 52  . Hiatal hernia 09/02/2014  . Polysubstance abuse (HCC)   . Portal hypertension with esophageal varices (HCC) 09/02/2014  . Portal hypertensive gastropathy (HCC) 09/02/2014  . Thrombocytopenia (HCC) 09/02/2014  . Upper GI bleed 08/31/2014    Past Surgical History:  Procedure Laterality Date  . ESOPHAGOGASTRODUODENOSCOPY Left 09/01/2014   Procedure: ESOPHAGOGASTRODUODENOSCOPY (EGD);  Surgeon: Charna Elizabeth, MD;  Location: [facility];  Service: Endoscopy;  Laterality: Left;  . EYE SURGERY Left    age 39     reports that he has been smoking cigarettes.  He has a 1.00 pack-year smoking history. He has never used smokeless tobacco. He reports that he has current or past drug history. Drug: Marijuana. He  reports that he does not drink alcohol.  Allergies  Allergen Reactions  . Latex Swelling and Rash    Family History  Problem Relation Age of Onset  . Leukemia Father        acute myloid   . Hyperlipidemia Father   . Hypertension Father      Prior to Admission medications   Medication Sig Start Date End Date Taking? Authorizing Provider  diclofenac sodium (VOLTAREN) 1 % GEL Apply 4 g topically 4 (four) times daily. 12/15/17  Yes [name], MD  FLUoxetine (PROZAC) 40 MG capsule Take 1 capsule (40 mg total) by mouth daily. 12/15/17  Yes [name], MD  furosemide (LASIX) 40 MG tablet Take 1 tablet (40 mg total) by mouth daily. 12/15/17  Yes [name], MD  Multiple Vitamin (MULTIVITAMIN WITH MINERALS) TABS tablet Take 0.5 tablets by mouth as needed.    Yes [provider]  ondansetron (ZOFRAN) 4 MG tablet TAKE 1 TABLET BY MOUTH 2 TIMES DAILY AS NEEDED FOR NAUSEA OR VOMITING. 03/17/17  Yes Newlin, Enobong, MD  propranolol (INDERAL) 10 MG tablet Take 0.5 tablets (5 mg total) by mouth 3 (three) times daily. 12/15/17  Yes [name], MD  traZODone (DESYREL) 100 MG tablet Take 1 tablet (100 mg total) by mouth at bedtime as needed. for insomnia 12/15/17  Yes [name], MD    Physical Exam: Vitals:   02/26/18 1547 02/26/18 1801 02/26/18 2011  BP: 108/73 113/69 120/72  Pulse: 62 63 62  Resp: Temp: 98.9 F (37.2 C)    TempSrc: Oral    SpO2: 94% 95% 95%    Constitutional: NAD, calm, comfortable Eyes: PERRL, lids and conjunctivae normal ENMT: Mucous membranes are moist. Posterior pharynx clear of any exudate or lesions.Normal dentition.  Neck: normal, supple, no masses, no thyromegaly Respiratory: clear to auscultation bilaterally, no wheezing, no crackles. Normal respiratory effort. No accessory muscle use.  Cardiovascular: Regular rate and rhythm, no murmurs / rubs / gallops. No extremity edema. 2+ pedal pulses. No carotid bruits.  Abdomen:  Distended with fluid wave, no tenderness, no peritoneal findings. Musculoskeletal: no clubbing / cyanosis. No joint deformity upper and lower extremities. Good ROM, no contractures. Normal muscle tone.  Skin: no rashes, lesions, ulcers. No induration Neurologic: CN 2-12 grossly intact. Sensation intact, DTR normal. Strength 5/5 in all 4.  Psychiatric: Normal judgment and insight. Alert and oriented x 3. Normal mood.    Labs on Admission: I have personally reviewed following labs and imaging studies  CBC: Recent Labs  Lab 02/25/18 1137 02/26/18 1605  WBC 4.7 6.0  NEUTROABS 2.7  --   HGB 12.9* 13.0  HCT 38.5 38.3*  MCV 91 91.6  PLT 86* 85*   Basic Metabolic Panel: Recent Labs  Lab 02/25/18 1137 02/26/18 1605  NA 141 139  K 3.8 3.6  CL 104 105  CO2 25 25  GLUCOSE 48* 87  BUN 4* 6  CREATININE 0.74* 0.68  CALCIUM 8.5* 8.4*   GFR: Estimated Creatinine Clearance: 96.4 mL/min (by C-G formula based on SCr of 0.68 mg/dL). Liver Function Tests: Recent Labs  Lab 02/25/18 1137 02/26/18 1605  AST 54* 59*  ALT 31 35  ALKPHOS 139* 124  BILITOT 1.0 2.0*  PROT 7.0 7.1  ALBUMIN 3.5 3.2*   Recent Labs  Lab 02/26/18 1605  LIPASE 107*   No results for input(s): AMMONIA in the last 168 hours. Coagulation Profile: Recent Labs  Lab 02/26/18 1605  INR 1.42   Cardiac Enzymes: No results for input(s): CKTOTAL, CKMB, CKMBINDEX, TROPONINI in the last 168 hours. BNP (last 3 results) No results for input(s): PROBNP in the last 8760 hours. HbA1C: No results for input(s): HGBA1C in the last 72 hours. CBG: No results for input(s): GLUCAP in the last 168 hours. Lipid Profile: No results for input(s): CHOL, HDL, LDLCALC, TRIG, CHOLHDL, LDLDIRECT in the last 72 hours. Thyroid Function Tests: No results for input(s): TSH, T4TOTAL, FREET4, T3FREE, THYROIDAB in the last 72 hours. Anemia Panel: No results for input(s): VITAMINB12, FOLATE, FERRITIN, TIBC, IRON, RETICCTPCT in the last  72 hours. Urine analysis:    Component Value Date/Time   COLORURINE AMBER (A) 02/26/2018 1605   APPEARANCEUR CLEAR 02/26/2018 1605   LABSPEC 1.019 02/26/2018 1605   PHURINE 7.0 02/26/2018 1605   GLUCOSEU NEGATIVE 02/26/2018 1605   HGBUR NEGATIVE 02/26/2018 1605   BILIRUBINUR NEGATIVE 02/26/2018 1605   BILIRUBINUR negative 02/25/2018 1121   KETONESUR NEGATIVE 02/26/2018 1605   PROTEINUR NEGATIVE 02/26/2018 1605   UROBILINOGEN 0.2 02/25/2018 1121   UROBILINOGEN 1.0 04/08/2009 1430   NITRITE NEGATIVE 02/26/2018 1605   LEUKOCYTESUR NEGATIVE 02/26/2018 1605    Radiological Exams on Admission: US Abdomen Complete  Result Date: 02/25/2018 CLINICAL DATA:  57 year old male with abdominal swelling. Alcoholic cirrhosis, ascites. EXAM: ABDOMEN ULTRASOUND COMPLETE COMPARISON:  CT Abdomen and Pelvis 01/24/2016. FINDINGS: Gallbladder: Mild gallbladder wall thickening of 3 millimeters. A 9 millimeter gallstone is visible in the gallbladder neck (  image 7). No sonographic Murphy sign elicited. Common bile duct: Diameter: 2 millimeters, normal. Liver: Atrophied, nodular cirrhotic liver. Heterogeneous echotexture with no discrete liver lesion identified. No intrahepatic biliary ductal dilatation. Portal vein is patent on color Doppler imaging with normal direction of blood flow towards the liver (image 73). IVC: No abnormality visualized. Pancreas: Visualized portion unremarkable. Spleen: Enlarged with estimated splenic volume 811 mL (normal splenic volume range 83 - 412 mL). No discrete splenic lesion. Right Kidney: Length: 10.9 centimeters. Echogenicity within normal limits. No mass or hydronephrosis visualized. Left Kidney: Length: 10.9 centimeters. Echogenicity within normal limits. No mass or hydronephrosis visualized. Abdominal aorta: Incompletely visualized due to overlying bowel gas, visualized portions within normal limits. Other findings: Moderate to large volume of ascites, probably increased compared  to that demonstrated on the 2017 CT. Fluid is visible in all 4 quadrants. IMPRESSION: 1. Cirrhotic liver with splenomegaly and at least moderate volume ascites. Fluid is visible in all 4 quadrants. 2. Mild cholelithiasis. Mild gallbladder wall thickening is likely reactive due to ascites. Electronically Signed   By: Odessa Fleming M.D.   On: 02/25/2018 14:39    EKG: Independently reviewed.  Assessment/Plan Active Problems:   Alcoholic cirrhosis of liver with ascites (HCC)    1. Alcoholic cirrhosis of liver with ascites - 1. No evidence to suggest SBP 2. Theraputic paracentesis in AM, looks like they've maxed at about 8L a session previously so will put this down as max to be removed tomorrow. 3. Send fluid for cytology and cell count. 4. Likely home after paracentesis and can follow up with PCP.  DVT prophylaxis: SCDs Code Status: Full Family Communication: No family in room Disposition Plan: Home likely tomorrow AM after paracentesis Consults called: None Admission status: Place in obs   McIntosh, Heywood Iles. DO [facility] Pager [phone]  If 7AM-7PM, please contact day team taking care of patient [email or web address] Password [email or web address]  02/26/2018, 8:14 PM

## 2018-02-26 NOTE — Telephone Encounter (Signed)
CMA spoke to patient emergency contact and inform her patient need to go to the emergency department.  Emergency contact will inform patient.

## 2018-02-27 ENCOUNTER — Observation Stay (HOSPITAL_COMMUNITY): Payer: 59

## 2018-02-27 DIAGNOSIS — K7031 Alcoholic cirrhosis of liver with ascites: Secondary | ICD-10-CM

## 2018-02-27 LAB — BODY FLUID CELL COUNT WITH DIFFERENTIAL
Lymphs, Fluid: 24 %
MONOCYTE-MACROPHAGE-SEROUS FLUID: 60 % (ref 50–90)
NEUTROPHIL FLUID: 16 % (ref 0–25)
Total Nucleated Cell Count, Fluid: 225 cu mm (ref 0–1000)

## 2018-02-27 LAB — HIV ANTIBODY (ROUTINE TESTING W REFLEX): HIV SCREEN 4TH GENERATION: NONREACTIVE

## 2018-02-27 MED ORDER — LIDOCAINE HCL 1 % IJ SOLN
INTRAMUSCULAR | Status: AC
  Filled 2018-02-27: qty 20

## 2018-02-27 NOTE — Progress Notes (Signed)
Pt is discharged to home. Dc instructions given. No concerns voiced. Left unit ambulatory to meet ride downstairs. Refused wheelchair transfer. Derinda Sis.

## 2018-02-27 NOTE — Procedures (Signed)
PROCEDURE SUMMARY:  Successful US guided paracentesis from right lateral abdomen.  Yielded 3.6 liters of clear yellow fluid.  No immediate complications.  Patient tolerated well.   Specimen was sent for labs.  Isaish Alemu S Yamina Lenis PA-C 02/27/2018 1:29 PM

## 2018-02-27 NOTE — Discharge Summary (Signed)
Physician Discharge Summary  Kenneth [name]  ZOX:[id number]  DOB: 20-May-1961  DOA: 02/26/2018 PCP: [name], MD  Admit date: 02/26/2018 Discharge date: 02/27/2018  Admitted From: Home Disposition: Home  Recommendations for Outpatient Follow-up:  1. Follow up with PCP in 1 week 2. Please obtain CMP/CBC in one week monitor renal function, liver function and hemoglobin  Discharge Condition: Stable CODE STATUS: Full code Diet recommendation: Heart Healthy /low-salt  Brief/Interim Summary: For full details see H&P/Progress note, but in brief, Kenneth [name] is a 57 year old male with medical history significant for alcohol cirrhosis with ascites presented to the emergency department complaining of abdominal distention.  His abdomen is been getting distended for the past 29-month, he was seen by his PCP who recommended therapeutic paracentesis.  Upon ED evaluation ultrasound confirmed large volume of ascites, platelets 85, INR 1.21 total bili 2.  Patient was admitted for therapeutic paracentesis.  Patient underwent procedure removing > 3 L of yellow fluid.  Fluid analysis did not consist with infectious process.  She clinically improved and deemed stable for discharge.  Subjective: Patient seen and examined, status post paracentesis.  No concerns.  No abdominal pain.  Feeling comfortable  Discharge Diagnoses/Hospital Course:  EtOH cirrhosis of liver with ascites No evidence suggest SBP Underwent ultrasound-guided therapeutic paracentesis, tolerated procedure well, fluid analysis with no signs of infection. Suspects fluid retention due to poor diet habits, education provided. Continue Lasix and Inderal with no changes Follow-up with PCP in 1 week  Chronic thrombocytopenia Due to cirrhosis Platelets stable omental signs of bleeding  On the day of the discharge the patient's vitals were stable, and no other acute medical condition were reported by patient. the patient was felt safe to be  discharge to home.   Discharge Instructions  You were cared for by a hospitalist during your hospital stay. If you have any questions about your discharge medications or the care you received while you were in the hospital after you are discharged, you can call the unit and asked to speak with the hospitalist on call if the hospitalist that took care of you is not available. Once you are discharged, your primary care physician will handle any further medical issues. Please note that NO REFILLS for any discharge medications will be authorized once you are discharged, as it is imperative that you return to your primary care physician (or establish a relationship with a primary care physician if you do not have one) for your aftercare needs so that they can reassess your need for medications and monitor your lab values.  Discharge Instructions    Call MD for:  difficulty breathing, headache or visual disturbances   Complete by:  As directed    Call MD for:  extreme fatigue   Complete by:  As directed    Call MD for:  hives   Complete by:  As directed    Call MD for:  persistant dizziness or light-headedness   Complete by:  As directed    Call MD for:  persistant nausea and vomiting   Complete by:  As directed    Call MD for:  redness, tenderness, or signs of infection (pain, swelling, redness, odor or green/yellow discharge around incision site)   Complete by:  As directed    Call MD for:  severe uncontrolled pain   Complete by:  As directed    Call MD for:  temperature >100.4   Complete by:  As directed    Diet - low sodium  heart healthy   Complete by:  As directed    Increase activity slowly   Complete by:  As directed      Allergies as of 02/27/2018      Reactions   Latex Swelling, Rash      Medication List    TAKE these medications   diclofenac sodium 1 % Gel Commonly known as:  VOLTAREN Apply 4 g topically 4 (four) times daily.   FLUoxetine 40 MG capsule Commonly known as:   PROZAC Take 1 capsule (40 mg total) by mouth daily.   furosemide 40 MG tablet Commonly known as:  LASIX Take 1 tablet (40 mg total) by mouth daily.   multivitamin with minerals Tabs tablet Take 0.5 tablets by mouth as needed.   ondansetron 4 MG tablet Commonly known as:  ZOFRAN TAKE 1 TABLET BY MOUTH 2 TIMES DAILY AS NEEDED FOR NAUSEA OR VOMITING.   propranolol 10 MG tablet Commonly known as:  INDERAL Take 0.5 tablets (5 mg total) by mouth 3 (three) times daily.   traZODone 100 MG tablet Commonly known as:  DESYREL Take 1 tablet (100 mg total) by mouth at bedtime as needed. for insomnia      Follow-up Information    [name], MD. Schedule an appointment as soon as possible for a visit in 1 week(s).   Specialty:  Family Medicine Why:  Hospital follow-up Contact information: [address] [place] [address] [phone]          Allergies  Allergen Reactions  . Latex Swelling and Rash    Consultations:  None    Procedures/Studies: US Abdomen Complete  Result Date: 02/25/2018 CLINICAL DATA:  57 year old male with abdominal swelling. Alcoholic cirrhosis, ascites. EXAM: ABDOMEN ULTRASOUND COMPLETE COMPARISON:  CT Abdomen and Pelvis 01/24/2016. FINDINGS: Gallbladder: Mild gallbladder wall thickening of 3 millimeters. A 9 millimeter gallstone is visible in the gallbladder neck (image 7). No sonographic Murphy sign elicited. Common bile duct: Diameter: 2 millimeters, normal. Liver: Atrophied, nodular cirrhotic liver. Heterogeneous echotexture with no discrete liver lesion identified. No intrahepatic biliary ductal dilatation. Portal vein is patent on color Doppler imaging with normal direction of blood flow towards the liver (image 73). IVC: No abnormality visualized. Pancreas: Visualized portion unremarkable. Spleen: Enlarged with estimated splenic volume 811 mL (normal splenic volume range 83 - 412 mL). No discrete splenic lesion. Right Kidney: Length: 10.9  centimeters. Echogenicity within normal limits. No mass or hydronephrosis visualized. Left Kidney: Length: 10.9 centimeters. Echogenicity within normal limits. No mass or hydronephrosis visualized. Abdominal aorta: Incompletely visualized due to overlying bowel gas, visualized portions within normal limits. Other findings: Moderate to large volume of ascites, probably increased compared to that demonstrated on the 2017 CT. Fluid is visible in all 4 quadrants. IMPRESSION: 1. Cirrhotic liver with splenomegaly and at least moderate volume ascites. Fluid is visible in all 4 quadrants. 2. Mild cholelithiasis. Mild gallbladder wall thickening is likely reactive due to ascites. Electronically Signed   By: Odessa Fleming M.D.   On: 02/25/2018 14:39    Discharge Exam: Vitals:   02/27/18 1026 02/27/18 1031  BP: 98/70 96/66  Pulse:    Resp:    Temp:    SpO2:     Vitals:   02/27/18 0611 02/27/18 1000 02/27/18 1026 02/27/18 1031  BP: 96/66  Pulse: 60     Resp: 18     Temp: 98.2 F (36.8 C)     TempSrc: Oral     SpO2: 94%  Weight:      Height:        General: NAD  Cardiovascular: RRR, S1/S2 + Respiratory: CTA bilaterally Abdominal: Soft, NT,mild distended, bowel sounds + Extremities: no edema  The results of significant diagnostics from this hospitalization (including imaging, microbiology, ancillary and laboratory) are listed below for reference.     Microbiology: No results found for this or any previous visit (from the past 240 hour(s)).   Labs: BNP (last 3 results) No results for input(s): BNP in the last 8760 hours. Basic Metabolic Panel: Recent Labs  Lab 02/25/18 1137 02/26/18 1605  NA 141 139  K 3.8 3.6  CL 104 105  CO2 25 25  GLUCOSE 48* 87  BUN 4* 6  CREATININE 0.74* 0.68  CALCIUM 8.5* 8.4*   Liver Function Tests: Recent Labs  Lab 02/25/18 1137 02/26/18 1605  AST 54* 59*  ALT 31 35  ALKPHOS 139* 124  BILITOT 1.0 2.0*  PROT 7.0 7.1  ALBUMIN 3.5  3.2*   Recent Labs  Lab 02/26/18 1605  LIPASE 107*   No results for input(s): AMMONIA in the last 168 hours. CBC: Recent Labs  Lab 02/25/18 1137 02/26/18 1605  WBC 4.7 6.0  NEUTROABS 2.7  --   HGB 12.9* 13.0  HCT 38.5 38.3*  MCV 91 91.6  PLT 86* 85*   Cardiac Enzymes: No results for input(s): CKTOTAL, CKMB, CKMBINDEX, TROPONINI in the last 168 hours. BNP: Invalid input(s): POCBNP CBG: No results for input(s): GLUCAP in the last 168 hours. D-Dimer No results for input(s): DDIMER in the last 72 hours. Hgb A1c No results for input(s): HGBA1C in the last 72 hours. Lipid Profile No results for input(s): CHOL, HDL, LDLCALC, TRIG, CHOLHDL, LDLDIRECT in the last 72 hours. Thyroid function studies No results for input(s): TSH, T4TOTAL, T3FREE, THYROIDAB in the last 72 hours.  Invalid input(s): FREET3 Anemia work up No results for input(s): VITAMINB12, FOLATE, FERRITIN, TIBC, IRON, RETICCTPCT in the last 72 hours. Urinalysis    Component Value Date/Time   COLORURINE AMBER (A) 02/26/2018 1605   APPEARANCEUR CLEAR 02/26/2018 1605   LABSPEC 1.019 02/26/2018 1605   PHURINE 7.0 02/26/2018 1605   GLUCOSEU NEGATIVE 02/26/2018 1605   HGBUR NEGATIVE 02/26/2018 1605   BILIRUBINUR NEGATIVE 02/26/2018 1605   BILIRUBINUR negative 02/25/2018 1121   KETONESUR NEGATIVE 02/26/2018 1605   PROTEINUR NEGATIVE 02/26/2018 1605   UROBILINOGEN 0.2 02/25/2018 1121   UROBILINOGEN 1.0 04/08/2009 1430   NITRITE NEGATIVE 02/26/2018 1605   LEUKOCYTESUR NEGATIVE 02/26/2018 1605   Sepsis Labs Invalid input(s): PROCALCITONIN,  WBC,  LACTICIDVEN Microbiology No results found for this or any previous visit (from the past 240 hour(s)).   Time coordinating discharge: 32 minutes  SIGNED:  Latrelle Dodrill, MD  [facility] 02/27/2018, 12:29 PM  Pager please text page via  [email or web address]  Note - This record has been created using [employer]. Chart creation errors have been sought, but may  not always have been located. Such creation errors do not reflect on the standard of medical care.

## 2018-03-15 ENCOUNTER — Encounter: Payer: Self-pay | Admitting: Family Medicine

## 2018-03-15 ENCOUNTER — Ambulatory Visit: Payer: 59 | Attending: Family Medicine | Admitting: Family Medicine

## 2018-03-15 VITALS — BP 111/72 | HR 58 | Temp 98.0°F | Ht 67.0 in | Wt 165.8 lb

## 2018-03-15 DIAGNOSIS — Z79899 Other long term (current) drug therapy: Secondary | ICD-10-CM | POA: Diagnosis not present

## 2018-03-15 DIAGNOSIS — F419 Anxiety disorder, unspecified: Secondary | ICD-10-CM | POA: Diagnosis not present

## 2018-03-15 DIAGNOSIS — K7031 Alcoholic cirrhosis of liver with ascites: Secondary | ICD-10-CM | POA: Diagnosis not present

## 2018-03-15 DIAGNOSIS — Z86718 Personal history of other venous thrombosis and embolism: Secondary | ICD-10-CM | POA: Diagnosis not present

## 2018-03-15 DIAGNOSIS — F325 Major depressive disorder, single episode, in full remission: Secondary | ICD-10-CM

## 2018-03-15 DIAGNOSIS — N5089 Other specified disorders of the male genital organs: Secondary | ICD-10-CM

## 2018-03-15 DIAGNOSIS — D696 Thrombocytopenia, unspecified: Secondary | ICD-10-CM | POA: Insufficient documentation

## 2018-03-15 DIAGNOSIS — E8809 Other disorders of plasma-protein metabolism, not elsewhere classified: Secondary | ICD-10-CM

## 2018-03-15 DIAGNOSIS — G4700 Insomnia, unspecified: Secondary | ICD-10-CM | POA: Insufficient documentation

## 2018-03-15 MED ORDER — ALBUMIN HUMAN 25 % IV SOLN: 25.0000 g | Freq: Once | INTRAVENOUS | 0 refills | Status: AC

## 2018-03-15 MED ORDER — FLUOXETINE HCL 40 MG PO CAPS: 40.0000 mg | ORAL_CAPSULE | Freq: Every day | ORAL | 3 refills | Status: DC

## 2018-03-15 MED FILL — FLUoxetine HCL 40 MG CAPS: 40 | 30 days supply | Qty: 30 | Fill #0

## 2018-03-15 NOTE — Patient Instructions (Signed)
Cirrhosis Cirrhosis is long-term (chronic) liver injury. The liver is your largest internal organ, and it performs many functions. The liver converts food into energy, removes toxic material from your blood, makes important proteins, and absorbs necessary vitamins from your diet. If you have cirrhosis, it means many of your healthy liver cells have been replaced by scar tissue. This prevents blood from flowing through your liver, which makes it difficult for your liver to function. This scarring is not reversible, but treatment can prevent it from getting worse. What are the causes? Hepatitis C and long-term alcohol abuse are the most common causes of cirrhosis. Other causes include:  Nonalcoholic fatty liver disease.  Hepatitis B infection.  Autoimmune hepatitis.  Diseases that cause blockage of ducts inside the liver.  Inherited liver diseases.  Reactions to certain long-term medicines.  Parasitic infections.  Long-term exposure to certain toxins.  What increases the risk? You may have a higher risk of cirrhosis if you:  Have certain hepatitis viruses.  Abuse alcohol, especially if you are male.  Are overweight.  Share needles.  Have unprotected sex with someone who has hepatitis.  What are the signs or symptoms? You may not have any signs and symptoms at first. Symptoms may not develop until the damage to your liver starts to get worse. Signs and symptoms of cirrhosis may include:  Tenderness in the right-upper part of your abdomen.  Weakness and tiredness (fatigue).  Loss of appetite.  Nausea.  Weight loss and muscle loss.  Itchiness.  Yellow skin and eyes (jaundice).  Buildup of fluid in the abdomen (ascites).  Swelling of the feet and ankles (edema).  Appearance of tiny blood vessels under the skin.  Mental confusion.  Easy bruising and bleeding.  How is this diagnosed? Your health care provider may suspect cirrhosis based on your symptoms and  medical history, especially if you have other medical conditions or a history of alcohol abuse. Your health care provider will do a physical exam to feel your liver and check for signs of cirrhosis. Your health care provider may perform other tests, including:  Blood tests to check: ? Whether you have hepatitis B or C. ? Kidney function. ? Liver function.  Imaging tests such as: ? MRI or CT scan to look for changes seen in advanced cirrhosis. ? Ultrasound to see if normal liver tissue is being replaced by scar tissue.  A procedure using a long needle to take a sample of liver tissue (biopsy) for examination under a microscope. Liver biopsy can confirm the diagnosis of cirrhosis.  How is this treated? Treatment depends on how damaged your liver is and what caused the damage. Treatment may include treating cirrhosis symptoms or treating the underlying causes of the condition to try to slow the progression of the damage. Treatment may include:  Making lifestyle changes, such as: ? Eating a healthy diet. ? Restricting salt intake. ? Maintaining a healthy weight. ? Not abusing drugs or alcohol.  Taking medicines to: ? Treat liver infections or other infections. ? Control itching. ? Reduce fluid buildup. ? Reduce certain blood toxins. ? Reduce risk of bleeding from enlarged blood vessels in the stomach or esophagus (varices).  If varices are causing bleeding problems, you may need treatment with a procedure that ties up the vessels causing them to fall off (band ligation).  If cirrhosis is causing your liver to fail, your health care provider may recommend a liver transplant.  Other treatments may be recommended depending on any complications   of cirrhosis, such as liver-related kidney failure (hepatorenal syndrome).  Follow these instructions at home:  Take medicines only as directed by your health care provider. Do not use drugs that are toxic to your liver. Ask your health care  provider before taking any new medicines, including over-the-counter medicines.  Rest as needed.  Eat a well-balanced diet. Ask your health care provider or dietitian for more information.  You may have to follow a low-salt diet or restrict your water intake as directed.  Do not drink alcohol. This is especially important if you are taking acetaminophen.  Keep all follow-up visits as directed by your health care provider. This is important. Contact a health care provider if:  You have fatigue or weakness that is getting worse.  You develop swelling of the hands, feet, legs, or face.  You have a fever.  You develop loss of appetite.  You have nausea or vomiting.  You develop jaundice.  You develop easy bruising or bleeding. Get help right away if:  You vomit bright red blood or a material that looks like coffee grounds.  You have blood in your stools.  Your stools appear black and tarry.  You become confused.  You have chest pain or trouble breathing. This information is not intended to replace advice given to you by your health care provider. Make sure you discuss any questions you have with your health care provider. Document Released: 10/13/2005 Document Revised: 02/21/2016 Document Reviewed: 06/21/2014 Elsevier Interactive Patient Education  2018 [employer]  

## 2018-03-15 NOTE — Progress Notes (Signed)
Subjective:  Patient ID: Kenneth [name], male    DOB: 1961/07/02  Age: 57 y.o. MRN: [id number]  CC: Hospitalization Follow-up   HPI KESTON [name] is a 57 year old male with a history of alcoholic cirrhosis of the liver , thrombocytopenia, previous history of SMA thrombosis , depression here for a follow up visit. At his last visit 2 weeks ago he had presented with increased abdominal swelling and ultrasound had revealed ascites for which he underwent paracentesis with removal of 3.6 L of ascitic fluid on 02/27/2018; cytology of which revealed reactive mesothelial cells, no evidence of malignancy and cultures revealed normal cell count.  He presents today accompanied by his ex-wife complaining of increased abdominal girth which he noticed 2 days after his paracentesis and now has scrotal swelling but denies pedal edema, shortness of breath.  He denies scrotal pain or abdominal pain and has no lower urinary tract symptoms or fever, denies nausea vomiting.  His liver cirrhosis has been stable for the last 2 years and he has not required any paracentesis;he endorses compliance with his medications and has not had any alcoholic drink since 06/3569. Review of his last set of labs from 02/26/2018 reveal an albumin of 3.2, hemoglobin of 13.0, thrombocytopenia of 85. His depression is controlled and he takes trazodone for insomnia.   Past Medical History:  Diagnosis Date  . Alcohol abuse   . Anxiety   . Blood clot in vein   . Cirrhosis ([place])   . Depression   . Esophageal varices ([place]) 09/02/2014  . H/O [facility] spotted fever    age 36 or 72  . Hiatal hernia 09/02/2014  . Polysubstance abuse ([place])   . Portal hypertension with esophageal varices (HCC) 09/02/2014  . Portal hypertensive gastropathy ([place]) 09/02/2014  . Thrombocytopenia ([place]) 09/02/2014  . Upper GI bleed 08/31/2014    Past Surgical History:  Procedure Laterality Date  . ESOPHAGOGASTRODUODENOSCOPY Left 09/01/2014   Procedure:  ESOPHAGOGASTRODUODENOSCOPY (EGD);  Surgeon: Juanita Craver, MD;  Location: [facility] END[facility];  Service: Endoscopy;  Laterality: Left;  . EYE SURGERY Left    age 47     Outpatient Medications Prior to Visit  Medication Sig Dispense Refill  . diclofenac sodium (VOLTAREN) 1 % GEL Apply 4 g topically 4 (four) times daily. 100 g 0  . furosemide (LASIX) 40 MG tablet Take 1 tablet (40 mg total) by mouth daily. 30 tablet 5  . Multiple Vitamin (MULTIVITAMIN WITH MINERALS) TABS tablet Take 0.5 tablets by mouth as needed.     . ondansetron (ZOFRAN) 4 MG tablet TAKE 1 TABLET BY MOUTH 2 TIMES DAILY AS NEEDED FOR NAUSEA OR VOMITING. 30 tablet 3  . propranolol (INDERAL) 10 MG tablet Take 0.5 tablets (5 mg total) by mouth 3 (three) times daily. 90 tablet 3  . traZODone (DESYREL) 100 MG tablet Take 1 tablet (100 mg total) by mouth at bedtime as needed. for insomnia 30 tablet 5  . FLUoxetine (PROZAC) 40 MG capsule Take 1 capsule (40 mg total) by mouth daily. 30 capsule 3   No facility-administered medications prior to visit.     ROS Review of Systems  Constitutional: Negative for activity change and appetite change.  HENT: Negative for sinus pressure and sore throat.   Eyes: Negative for visual disturbance.  Respiratory: Negative for cough, chest tightness and shortness of breath.   Cardiovascular: Negative for chest pain and leg swelling.  Gastrointestinal: Positive for abdominal distention. Negative for abdominal pain, constipation and diarrhea.  Endocrine: Negative.  Genitourinary: Negative for dysuria.  Musculoskeletal: Negative for joint swelling and myalgias.  Skin: Negative for rash.  Allergic/Immunologic: Negative.   Neurological: Negative for weakness, light-headedness and numbness.  Psychiatric/Behavioral: Negative for dysphoric mood and suicidal ideas.    Objective:  BP 111/72   Pulse (!) 58   Temp 98 F (36.7 C) (Oral)   Ht _0  (1.702 m)   Wt 165 lb 12.8 oz (75.2 kg)   SpO2 95%   BMI  25.97 kg/m   BP/Weight 03/15/2018 0/04/6807 05/27/1030  Systolic BP 594 96 -  Diastolic BP 72 66 -  Wt. (Lbs) 165.8 - 166.5  BMI 25.97 - 26.08      Physical Exam  Constitutional: He is oriented to person, place, and time. He appears well-developed and well-nourished.  Cardiovascular: Normal rate, normal heart sounds and intact distal pulses.  No murmur heard. Pulmonary/Chest: Effort normal and breath sounds normal. He has no wheezes. He has no rales. He exhibits no tenderness.  Abdominal: Soft. Bowel sounds are normal. He exhibits distension (ascites). He exhibits no mass. There is no tenderness.  Genitourinary:  Genitourinary Comments: Edema of scrotum, penis  Musculoskeletal: Normal range of motion.  Neurological: He is alert and oriented to person, place, and time.  Psychiatric: He has a normal mood and affect.     Assessment & Plan:   1. Alcoholic cirrhosis of liver with ascites (HCC) Status post paracentesis 2 weeks ago We have been able to obtain an urgent appointment for paracentesis tomorrow Continue Lasix, propranolol Unable to tolerate spironolactone due to mastodynia and gynecomastia in the past - CMP14+EGFR - Protime-INR - CBC with Differential/Platelet - IR Paracentesis; Future  2. Major depressive disorder with single episode, in full remission (HCC) Controlled - FLUoxetine (PROZAC) 40 MG capsule; Take 1 capsule (40 mg total) by mouth daily.  Dispense: 30 capsule; Refill: 3  3. Hypoalbuminemia Could explain current ascites and scrotal edema  4. Scrotal edema Likely from hypoalbuminemia Advised to elevate scrotum continue Lasix   Meds ordered this encounter  Medications  . albumin human (PLASBUMIN-25) 25 % bottle    Sig: Inject 100 mLs (25 g total) into the vein once for 1 dose.    Dispense:  100 mL    Refill:  0  . FLUoxetine (PROZAC) 40 MG capsule    Sig: Take 1 capsule (40 mg total) by mouth daily.    Dispense:  30 capsule    Refill:  3     Discontinue Celexa    Follow-up: Return in about 2 weeks (around 03/29/2018) for follow up of ascites.   Charlott Rakes MD

## 2018-03-16 ENCOUNTER — Encounter (HOSPITAL_COMMUNITY): Payer: Self-pay | Admitting: Student

## 2018-03-16 ENCOUNTER — Other Ambulatory Visit: Payer: Self-pay | Admitting: Family Medicine

## 2018-03-16 ENCOUNTER — Ambulatory Visit (HOSPITAL_COMMUNITY)
Admission: RE | Admit: 2018-03-16 | Discharge: 2018-03-16 | Disposition: A | Discharge status: Home / Self Care | Payer: 59 | Source: Ambulatory Visit | Attending: Family Medicine | Admitting: Family Medicine

## 2018-03-16 DIAGNOSIS — K7031 Alcoholic cirrhosis of liver with ascites: Secondary | ICD-10-CM | POA: Diagnosis not present

## 2018-03-16 HISTORY — PX: IR PARACENTESIS: IMG2679

## 2018-03-16 LAB — CBC WITH DIFFERENTIAL/PLATELET
Basophils Absolute: 0 10*3/uL (ref 0.0–0.2)
Basos: 1 %
EOS (ABSOLUTE): 0.2 10*3/uL (ref 0.0–0.4)
Eos: 4 %
HEMATOCRIT: 38.8 % (ref 37.5–51.0)
Hemoglobin: 12.5 g/dL — ABNORMAL LOW (ref 13.0–17.7)
IMMATURE GRANS (ABS): 0 10*3/uL (ref 0.0–0.1)
IMMATURE GRANULOCYTES: 1 %
Lymphocytes Absolute: 0.5 10*3/uL — ABNORMAL LOW (ref 0.7–3.1)
Lymphs: 13 %
MCH: 29.9 pg (ref 26.6–33.0)
MCHC: 32.2 g/dL (ref 31.5–35.7)
MCV: 93 fL (ref 79–97)
MONOCYTES: 20 %
Monocytes Absolute: 0.8 10*3/uL (ref 0.1–0.9)
NEUTROS ABS: 2.5 10*3/uL (ref 1.4–7.0)
NEUTROS PCT: 61 %
PLATELETS: 85 10*3/uL — AB (ref 150–450)
RBC: 4.18 x10E6/uL (ref 4.14–5.80)
RDW: 15.4 % (ref 12.3–15.4)
WBC: 4.1 10*3/uL (ref 3.4–10.8)

## 2018-03-16 LAB — CMP14+EGFR
ALK PHOS: 141 IU/L — AB (ref 39–117)
ALT: 33 IU/L (ref 0–44)
AST: 60 IU/L — AB (ref 0–40)
Albumin/Globulin Ratio: 1 — ABNORMAL LOW (ref 1.2–2.2)
Albumin: 3.1 g/dL — ABNORMAL LOW (ref 3.5–5.5)
BUN/Creatinine Ratio: 8 — ABNORMAL LOW (ref 9–20)
BUN: 6 mg/dL (ref 6–24)
Bilirubin Total: 1 mg/dL (ref 0.0–1.2)
CALCIUM: 8.2 mg/dL — AB (ref 8.7–10.2)
CO2: 24 mmol/L (ref 20–29)
CREATININE: 0.73 mg/dL — AB (ref 0.76–1.27)
Chloride: 102 mmol/L (ref 96–106)
GFR calc Af Amer: 120 mL/min/{1.73_m2} (ref >59)
GFR, EST NON AFRICAN AMERICAN: 104 mL/min/{1.73_m2} (ref >59)
GLUCOSE: 84 mg/dL (ref 65–99)
Globulin, Total: 3.1 g/dL (ref 1.5–4.5)
Potassium: 3.9 mmol/L (ref 3.5–5.2)
SODIUM: 139 mmol/L (ref 134–144)
Total Protein: 6.2 g/dL (ref 6.0–8.5)

## 2018-03-16 LAB — PROTIME-INR
INR: 1.4 — AB (ref 0.8–1.2)
PROTHROMBIN TIME: 14 s — AB (ref 9.1–12.0)

## 2018-03-16 MED ORDER — LIDOCAINE HCL (PF) 1 % IJ SOLN
INTRAMUSCULAR | Status: DC | PRN
  Administered 2018-03-16: 10 mL

## 2018-03-16 MED ORDER — LIDOCAINE HCL (PF) 2 % IJ SOLN
INTRAMUSCULAR | Status: AC
  Filled 2018-03-16: qty 20

## 2018-03-16 MED ORDER — ALBUMIN HUMAN 25 % IV SOLN
25.0000 g | Freq: Once | INTRAVENOUS | Status: AC
  Administered 2018-03-16: 25 g via INTRAVENOUS
  Filled 2018-03-16: qty 100

## 2018-03-16 MED FILL — ONDANSETRON HCL 4 MG TABLET: 4 | 15 days supply | Qty: 30 | Fill #2

## 2018-03-16 MED FILL — FUROSEMIDE 40 MG TAB: 40 | 30 days supply | Qty: 30 | Fill #4

## 2018-03-16 MED FILL — PROPRANOLOL 10 MG TABLET: 10 | 30 days supply | Qty: 45 | Fill #4

## 2018-03-16 MED FILL — traZODone HCL 100 MG TABS: 100 | 30 days supply | Qty: 30 | Fill #0

## 2018-03-16 NOTE — Procedures (Signed)
PROCEDURE SUMMARY:  Successful US guided paracentesis from right lateral abdomen.  Yielded 4.0 liters of yellow fluid.  No immediate complications.  Pt tolerated well.   Specimen was not sent for labs.  Hoyt Koch PA-C 03/16/2018 2:04 PM

## 2018-03-25 ENCOUNTER — Ambulatory Visit: Payer: 59 | Attending: Internal Medicine | Admitting: Internal Medicine

## 2018-03-25 ENCOUNTER — Encounter: Payer: Self-pay | Admitting: Internal Medicine

## 2018-03-25 VITALS — BP 105/63 | HR 66 | Temp 99.2°F | Resp 16 | Wt 167.2 lb

## 2018-03-25 DIAGNOSIS — K7031 Alcoholic cirrhosis of liver with ascites: Secondary | ICD-10-CM | POA: Diagnosis not present

## 2018-03-25 DIAGNOSIS — D696 Thrombocytopenia, unspecified: Secondary | ICD-10-CM | POA: Diagnosis not present

## 2018-03-25 DIAGNOSIS — F17210 Nicotine dependence, cigarettes, uncomplicated: Secondary | ICD-10-CM | POA: Insufficient documentation

## 2018-03-25 DIAGNOSIS — F329 Major depressive disorder, single episode, unspecified: Secondary | ICD-10-CM | POA: Diagnosis not present

## 2018-03-25 DIAGNOSIS — N5089 Other specified disorders of the male genital organs: Secondary | ICD-10-CM

## 2018-03-25 DIAGNOSIS — Z86718 Personal history of other venous thrombosis and embolism: Secondary | ICD-10-CM | POA: Insufficient documentation

## 2018-03-25 DIAGNOSIS — Z79899 Other long term (current) drug therapy: Secondary | ICD-10-CM | POA: Diagnosis not present

## 2018-03-25 NOTE — Progress Notes (Signed)
Patient ID: Kenneth [name], male    DOB: 08-21-1961  MRN: [id number]  CC: Edema (abdominal)   Subjective: Kenneth [name] is a 57 y.o. male who presents for UC visit His concerns today include:  alcoholic cirrhosis of the liver , thrombocytopenia, previous history of SMA thrombosis , depression   Pt presents requesting to have paracentesis for removal of ascitic fluid from abdomen. He has cirrhosis of the liver. He has an appointment with gastroenterology next month. He had 4 L of fluid removed on removed on 5/20 -The fluid has reaccumulated and is causing discomfort for him. Causes early satiety and abdomen feels tight. He denies any pain or fevers; no rectal bleeding  Patient Active Problem List   Diagnosis Date Noted  . Depression 12/18/2016  . Testosterone deficiency in male 08/11/2016  . Inguinal hernia 02/28/2016  . Insomnia 02/12/2016  . Rectal bleed 01/24/2016  . Umbilical hernia 01/24/2016  . Ascites 01/24/2016  . SBP (spontaneous bacterial peritonitis) (HCC) 01/24/2016  . Tobacco abuse 01/24/2016  . Gynecomastia, male 01/24/2016  . Abdominal pain, generalized   . Esophageal varices (HCC) 09/02/2014  . Portal hypertension with esophageal varices (HCC) 09/02/2014  . Portal hypertensive gastropathy (HCC) 09/02/2014  . Hiatal hernia 09/02/2014  . Acute blood loss anemia 09/02/2014  . Leukopenia 09/02/2014  . Thrombocytopenia (HCC) 09/02/2014  . Alcoholic cirrhosis of liver with ascites (HCC)   . Alcohol abuse 08/31/2014     Current Outpatient Medications on File Prior to Visit  Medication Sig Dispense Refill  . diclofenac sodium (VOLTAREN) 1 % GEL Apply 4 g topically 4 (four) times daily. 100 g 0  . FLUoxetine (PROZAC) 40 MG capsule Take 1 capsule (40 mg total) by mouth daily. 30 capsule 3  . furosemide (LASIX) 40 MG tablet Take 1 tablet (40 mg total) by mouth daily. 30 tablet 5  . Multiple Vitamin (MULTIVITAMIN WITH MINERALS) TABS tablet Take 0.5 tablets by mouth as  needed.     . ondansetron (ZOFRAN) 4 MG tablet TAKE 1 TABLET BY MOUTH 2 TIMES DAILY AS NEEDED FOR NAUSEA OR VOMITING. 30 tablet 3  . propranolol (INDERAL) 10 MG tablet Take 0.5 tablets (5 mg total) by mouth 3 (three) times daily. 90 tablet 3  . traZODone (DESYREL) 100 MG tablet Take 1 tablet (100 mg total) by mouth at bedtime as needed. for insomnia 30 tablet 5   No current facility-administered medications on file prior to visit.     Allergies  Allergen Reactions  . Latex Swelling and Rash    Social History   Socioeconomic History  . Marital status: Single    Spouse name: Not on file  . Number of children: 1  . Years of education: Not on file  . Highest education level: Not on file  Occupational History  . Occupation: Administrator  Social Needs  . Financial resource strain: Not on file  . Food insecurity:    Worry: Not on file    Inability: Not on file  . Transportation needs:    Medical: Not on file    Non-medical: Not on file  Tobacco Use  . Smoking status: Current Every Day Smoker    Packs/day: 0.50    Years: 2.00    Pack years: 1.00    Types: Cigarettes  . Smokeless tobacco: Never Used  Substance and Sexual Activity  . Alcohol use: No    Comment: no alcohol since 01/23/16  . Drug use: Yes    Types: Marijuana  Comment: 2 months  . Sexual activity: Not on file  Lifestyle  . Physical activity:    Days per week: Not on file    Minutes per session: Not on file  . Stress: Not on file  Relationships  . Social connections:    Talks on phone: Not on file    Gets together: Not on file    Attends religious service: Not on file    Active member of club or organization: Not on file    Attends meetings of clubs or organizations: Not on file    Relationship status: Not on file  . Intimate partner violence:    Fear of current or ex partner: Not on file    Emotionally abused: Not on file    Physically abused: Not on file    Forced sexual activity: Not on file  Other  Topics Concern  . Not on file  Social History Narrative  . Not on file    Family History  Problem Relation Age of Onset  . Leukemia Father        acute myloid   . Hyperlipidemia Father   . Hypertension Father     Past Surgical History:  Procedure Laterality Date  . ESOPHAGOGASTRODUODENOSCOPY Left 09/01/2014   Procedure: ESOPHAGOGASTRODUODENOSCOPY (EGD);  Surgeon: Charna Elizabeth, MD;  Location: [facility];  Service: Endoscopy;  Laterality: Left;  . EYE SURGERY Left    age 25  . IR PARACENTESIS  03/16/2018    ROS: Review of Systems Negative except as stated above PHYSICAL EXAM: BP 105/63   Pulse 66   Temp 99.2 F (37.3 C) (Oral)   Resp 16   Wt 167 lb 3.2 oz (75.8 kg)   SpO2 94%   BMI 26.19 kg/m   Physical Exam  General appearance - alert, well appearing, and in no distress Mental status - normal mood, behavior, speech, dress, motor activity, and thought processes Abdomen - markedly distended with spider veins and fluid wave.  GU Male - marked scrotal edema  ASSESSMENT AND PLAN: 1. Alcoholic cirrhosis of liver with ascites (HCC) Will schedule paracentesis as out pt procedure at the hospital - PT and PTT; Future - US Paracentesis; Future  2. Scrotal edema   Patient was given the opportunity to ask questions.  Patient verbalized understanding of the plan and was able to repeat key elements of the plan.   Orders Placed This Encounter  Procedures  . US Paracentesis  . PT and PTT     Requested Prescriptions   Signed Prescriptions Disp Refills  . albumin human 25 % bottle 100 mL 0    Sig: Inject 100 mLs (25 g total) into the vein once for 1 dose.    No follow-ups on file.  Jonah Blue, MD, FACP

## 2018-03-26 ENCOUNTER — Ambulatory Visit: Payer: 59 | Attending: Family Medicine

## 2018-03-26 DIAGNOSIS — K7031 Alcoholic cirrhosis of liver with ascites: Secondary | ICD-10-CM

## 2018-03-26 MED ORDER — ALBUMIN HUMAN 25 % IV SOLN: 25.0000 g | Freq: Once | INTRAVENOUS | 0 refills | Status: AC

## 2018-03-26 NOTE — Addendum Note (Signed)
Addended by: Jonah Blue B on: 03/26/2018 03:56 PM   Modules accepted: Orders

## 2018-03-26 NOTE — Progress Notes (Signed)
Patient here for lab visit only 

## 2018-03-27 LAB — PT AND PTT
INR: 1.4 — ABNORMAL HIGH (ref 0.8–1.2)
Prothrombin Time: 13.8 s — ABNORMAL HIGH (ref 9.1–12.0)
aPTT: 35 s — ABNORMAL HIGH (ref 24–33)

## 2018-03-29 ENCOUNTER — Encounter (HOSPITAL_COMMUNITY): Payer: Self-pay | Admitting: Student

## 2018-03-29 ENCOUNTER — Telehealth: Payer: Self-pay | Admitting: Family Medicine

## 2018-03-29 ENCOUNTER — Ambulatory Visit (HOSPITAL_COMMUNITY)
Admission: RE | Admit: 2018-03-29 | Discharge: 2018-03-29 | Disposition: A | Discharge status: Home / Self Care | Payer: 59 | Source: Ambulatory Visit | Attending: Internal Medicine | Admitting: Internal Medicine

## 2018-03-29 DIAGNOSIS — K7031 Alcoholic cirrhosis of liver with ascites: Secondary | ICD-10-CM | POA: Diagnosis not present

## 2018-03-29 HISTORY — PX: IR PARACENTESIS: IMG2679

## 2018-03-29 MED ORDER — LIDOCAINE HCL (PF) 2 % IJ SOLN
INTRAMUSCULAR | Status: AC
  Filled 2018-03-29: qty 20

## 2018-03-29 MED ORDER — ALBUMIN HUMAN 25 % IV SOLN
25.0000 g | Freq: Once | INTRAVENOUS | Status: AC
  Administered 2018-03-29: 25 g via INTRAVENOUS
  Filled 2018-03-29: qty 100

## 2018-03-29 NOTE — Procedures (Signed)
PROCEDURE SUMMARY:  Successful image-guided paracentesis from the right abdomen.  Yielded 4 liters of clear yellow fluid.  No immediate complications.  Patient tolerated well.  Patient to receive Albumin with procedure today.   Specimen was not sent for labs.  Gordy Councilmanlexandra Louk PA-C 03/29/2018 10:34 AM

## 2018-03-29 NOTE — Telephone Encounter (Signed)
Patient called requesting a call back about treatment. Please fu with patient.

## 2018-03-30 NOTE — Telephone Encounter (Signed)
Looks like he had his paracentesis yesterday.  Please verify what treatment he is referring to.

## 2018-03-30 NOTE — Telephone Encounter (Signed)
Patient was called and inquired about paperwork that was dropped off. Patient was informed that he will be contacted once paperwork is ready for pick up.

## 2018-03-30 NOTE — Telephone Encounter (Signed)
Do u know what treatment he is referring to.

## 2018-03-30 NOTE — Telephone Encounter (Signed)
No but I will forward Dr. Laural BenesJohnson the message

## 2018-04-06 ENCOUNTER — Telehealth: Payer: Self-pay | Admitting: Family Medicine

## 2018-04-06 NOTE — Telephone Encounter (Signed)
Patient called requesting an update on paper work.

## 2018-04-07 NOTE — Telephone Encounter (Signed)
Paperwork not done yet. Will call patient once paperwork is done.

## 2018-04-09 ENCOUNTER — Encounter: Payer: Self-pay | Admitting: Family Medicine

## 2018-04-16 ENCOUNTER — Encounter: Payer: Self-pay | Admitting: Gastroenterology

## 2018-04-16 ENCOUNTER — Other Ambulatory Visit: Payer: Self-pay | Admitting: Family Medicine

## 2018-04-16 ENCOUNTER — Other Ambulatory Visit (INDEPENDENT_AMBULATORY_CARE_PROVIDER_SITE_OTHER): Payer: 59

## 2018-04-16 ENCOUNTER — Ambulatory Visit: Payer: 59 | Admitting: Gastroenterology

## 2018-04-16 VITALS — BP 98/70 | HR 60 | Ht 66.75 in | Wt 170.2 lb

## 2018-04-16 DIAGNOSIS — D696 Thrombocytopenia, unspecified: Secondary | ICD-10-CM | POA: Diagnosis not present

## 2018-04-16 DIAGNOSIS — K7031 Alcoholic cirrhosis of liver with ascites: Secondary | ICD-10-CM | POA: Diagnosis not present

## 2018-04-16 DIAGNOSIS — D689 Coagulation defect, unspecified: Secondary | ICD-10-CM | POA: Diagnosis not present

## 2018-04-16 DIAGNOSIS — I8510 Secondary esophageal varices without bleeding: Secondary | ICD-10-CM

## 2018-04-16 LAB — CBC WITH DIFFERENTIAL/PLATELET
BASOS PCT: 0.3 % (ref 0.0–3.0)
Basophils Absolute: 0 10*3/uL (ref 0.0–0.1)
EOS ABS: 0.2 10*3/uL (ref 0.0–0.7)
Eosinophils Relative: 3.8 % (ref 0.0–5.0)
HCT: 44.4 % (ref 39.0–52.0)
Hemoglobin: 15.1 g/dL (ref 13.0–17.0)
Lymphocytes Relative: 10.3 % — ABNORMAL LOW (ref 12.0–46.0)
Lymphs Abs: 0.6 10*3/uL — ABNORMAL LOW (ref 0.7–4.0)
MCHC: 34.1 g/dL (ref 30.0–36.0)
MCV: 91.7 fl (ref 78.0–100.0)
MONO ABS: 1.1 10*3/uL — AB (ref 0.1–1.0)
Monocytes Relative: 20 % — ABNORMAL HIGH (ref 3.0–12.0)
NEUTROS ABS: 3.7 10*3/uL (ref 1.4–7.7)
NEUTROS PCT: 65.6 % (ref 43.0–77.0)
PLATELETS: 91 10*3/uL — AB (ref 150.0–400.0)
RBC: 4.84 Mil/uL (ref 4.22–5.81)
RDW: 16.5 % — AB (ref 11.5–15.5)
WBC: 5.7 10*3/uL (ref 4.0–10.5)

## 2018-04-16 LAB — COMPREHENSIVE METABOLIC PANEL
ALK PHOS: 157 U/L — AB (ref 39–117)
ALT: 33 U/L (ref 0–53)
AST: 63 U/L — AB (ref 0–37)
Albumin: 3.2 g/dL — ABNORMAL LOW (ref 3.5–5.2)
BILIRUBIN TOTAL: 2.3 mg/dL — AB (ref 0.2–1.2)
BUN: 7 mg/dL (ref 6–23)
CO2: 29 meq/L (ref 19–32)
CREATININE: 0.69 mg/dL (ref 0.40–1.50)
Calcium: 8.7 mg/dL (ref 8.4–10.5)
Chloride: 99 mEq/L (ref 96–112)
GFR: 125.61 mL/min (ref >60.00)
GLUCOSE: 97 mg/dL (ref 70–99)
Potassium: 3.6 mEq/L (ref 3.5–5.1)
Sodium: 132 mEq/L — ABNORMAL LOW (ref 135–145)
TOTAL PROTEIN: 7.5 g/dL (ref 6.0–8.3)

## 2018-04-16 LAB — PROTIME-INR
INR: 1.6 ratio — AB (ref 0.8–1.0)
PROTHROMBIN TIME: 18.5 s — AB (ref 9.6–13.1)

## 2018-04-16 MED FILL — PROPRANOLOL 10 MG TABLET: 10 | 30 days supply | Qty: 45 | Fill #5

## 2018-04-16 MED FILL — traZODone HCL 100 MG TABS: 100 | 30 days supply | Qty: 30 | Fill #1

## 2018-04-16 MED FILL — FLUoxetine HCL 40 MG CAPS: 40 | 30 days supply | Qty: 30 | Fill #1

## 2018-04-16 MED FILL — FUROSEMIDE 40 MG TAB: 40 | 30 days supply | Qty: 30 | Fill #5

## 2018-04-16 NOTE — Patient Instructions (Signed)
If you are age 57 or older, your body mass index should be between 23-30. Your Body mass index is 26.87 kg/m. If this is out of the aforementioned range listed, please consider follow up with your Primary Care Provider.  If you are age 57 or younger, your body mass index should be between 19-25. Your Body mass index is 26.87 kg/m. If this is out of the aformentioned range listed, please consider follow up with your Primary Care Provider.   You have been scheduled for an abdominal paracentesis at [facility] radiology (1st floor of hospital) on Monday 04-19-2018 at 10am. Please arrive at least 15 minutes prior to your appointment time for registration. Should you need to reschedule this appointment for any reason, please call our office at [phone].  You have been scheduled for an abdominal paracentesis at [facility] (1st floor of hospital) on 04-23-2018 at 9am. Please arrive at least 15 minutes prior to your appointment time for registration. Should you need to reschedule this appointment for any reason, please call our office at [phone].  We have you scheduled at [facility] [phone]( [phone]) ( [address]) July 2nd at 930 am please arrive at 9 am.  It was a pleasure to meet you today!  Dr. Myrtie Neitheranis

## 2018-04-16 NOTE — Progress Notes (Signed)
[name]  Chief Complaint: Large volume ascites  Subjective  History:   This is a 57 year old man I have seen once in the office October 2017 for alcohol related cirrhosis and esophageal varices.  He had previously had an upper endoscopy in November 2015 by Dr.Mann, during a hospital stay for upper GI bleed, at which time esophageal varices were banded. He has not return for office follow-up with me since the 2017 visit.  [name] was recently hospitalized for large volume ascites requiring 3.6Liter  paracentesis.  He had also previously had an episode of SMV thrombosis.  See March 2017 CT abdomen pelvis report for details.  [name] is accompanied by his sister and ex-wife today with concerns and questions about his condition.  For the last several months he has noticed worsening abdominal distention that is now become quite pronounced.  It is uncomfortable and seems to recur quickly after the paracentesis.  He is short of breath with exertion but okay at rest.  He denies chest pain.  He has early satiety but no vomiting.  ROS: Peripheral edema Depression  Remainder of systems negative except as in history Past Medical History:  Diagnosis Date  . Alcohol abuse   . Anxiety   . Blood clot in vein   . Cirrhosis (HCC)   . Depression   . Esophageal varices (HCC) 09/02/2014  . H/O [facility] spotted fever    age 23 or 28  . Hiatal hernia 09/02/2014  . Polysubstance abuse (HCC)   . Portal hypertension with esophageal varices (HCC) 09/02/2014  . Portal hypertensive gastropathy (HCC) 09/02/2014  . Thrombocytopenia (HCC) 09/02/2014  . Upper GI bleed 08/31/2014   Allergies to latex  Last alcohol consumption was March 2017.  No known family history of liver cancer The patient's Past Medical, Family and Social History were reviewed and are on file in the EMR.  Objective:  Med list reviewed  Current Outpatient Medications:  .  diclofenac sodium (VOLTAREN) 1  % GEL, Apply 4 g topically 4 (four) times daily., Disp: 100 g, Rfl: 0 .  FLUoxetine (PROZAC) 40 MG capsule, Take 1 capsule (40 mg total) by mouth daily., Disp: 30 capsule, Rfl: 3 .  furosemide (LASIX) 40 MG tablet, Take 1 tablet (40 mg total) by mouth daily., Disp: 30 tablet, Rfl: 5 .  Multiple Vitamin (MULTIVITAMIN WITH MINERALS) TABS tablet, Take 0.5 tablets by mouth as needed. , Disp: , Rfl:  .  ondansetron (ZOFRAN) 4 MG tablet, TAKE 1 TABLET BY MOUTH 2 TIMES DAILY AS NEEDED FOR NAUSEA OR VOMITING., Disp: 30 tablet, Rfl: 3 .  propranolol (INDERAL) 10 MG tablet, Take 0.5 tablets (5 mg total) by mouth 3 (three) times daily., Disp: 90 tablet, Rfl: 3 .  traZODone (DESYREL) 100 MG tablet, Take 1 tablet (100 mg total) by mouth at bedtime as needed. for insomnia, Disp: 30 tablet, Rfl: 5   Vital signs in last 24 hrs: Vitals:   04/16/18 1357  BP: 98/70  Pulse: 60   I rechecked his blood pressure during the visit and got a very similar reading.  Physical Exam  Chronically ill-appearing man with poor muscle mass.  His speech is fluent, mentation normal, normal gross motor function and no asterixis.  HEENT: sclera anicteric, oral mucosa moist without lesions  Neck: supple, no thyromegaly, JVD or lymphadenopathy  Cardiac: RRR with a soft systolic murmur, 1+ peripheral edema bilaterally  Pulm: clear to auscultation bilaterally, normal RR and effort noted  Abdomen: soft, markedly distended with ascites, no tenderness, unable to assess hepatosplenomegaly due to the degree of ascites.  Skin; warm and dry, no jaundice or rash.  Spider nevi  Labs: Last alpha-fetoprotein 3.0 in October 2017  CBC Latest Ref Rng & Units 04/16/2018 03/15/2018 02/26/2018  WBC 4.0 - 10.5 K/uL 5.7 4.1 6.0  Hemoglobin 13.0 - 17.0 g/dL 16.115.1 12.5(L) 13.0  Hematocrit 39.0 - 52.0 % 44.4 38.8 38.3(L)  Platelets 150.0 - 400.0 K/uL 91.0(L) 85(LL) 85(L)    CMP Latest Ref Rng & Units 04/16/2018 03/15/2018 02/26/2018  Glucose 70 -  99 mg/dL 97 84 87  BUN 6 - 23 mg/dL 7 6 6   Creatinine 0.40 - 1.50 mg/dL 0.960.69 0.45(W0.73(L) 0.980.68  Sodium 135 - 145 mEq/L 132(L) 139 139  Potassium 3.5 - 5.1 mEq/L 3.6 3.9 3.6  Chloride 96 - 112 mEq/L 99 102 105  CO2 19 - 32 mEq/L 29 24 25   Calcium 8.4 - 10.5 mg/dL 8.7 1.1(B8.2(L) 1.4(N8.4(L)  Total Protein 6.0 - 8.3 g/dL 7.5 6.2 7.1  Total Bilirubin 0.2 - 1.2 mg/dL 2.3(H) 1.0 2.0(H)  Alkaline Phos 39 - 117 U/L 157(H) 141(H) 124  AST 0 - 37 U/L 63(H) 60(H) 59(H)  ALT 0 - 53 U/L 33 33 35   INR 1.4 on 03/26/18  MELD = 10, BASED ON MOST RECENT DATA  HCV neg 12/2015  Radiologic studies:  Abdominal ultrasound 02/25/2018 with ascites, cirrhotic appearing liver, normal portal vein flow, no liver mass noted.  4 L paracentesis performed on May 21; 4 L paracentesis performed on June 3  @[email or web address] @ Assessment: Encounter Diagnoses  Name Primary?  . Alcoholic cirrhosis of liver with ascites (HCC) Yes  . Secondary esophageal varices without bleeding (HCC)   . Coagulopathy (HCC)   . Thrombocytopenia (HCC)    Decompensated alcohol-related cirrhosis with large volume ascites, mild peripheral edema and thus far preserved renal function.  His chronic hypotension precludes additional diuretics. While he has very large volume ascites today, he is not in acute distress.  While there was reportedly normal directional Doppler flow in the liver on recent ultrasound, given the worsening of the ascites in recent months and his history of prior clot, I am concerned there may be a portal vein thrombosis.  Hepatocellular carcinoma is a consideration, and we must consider CHF and/or valvular heart disease or pulmonary hypertension.  Plan: We were able to arrange a 5 L paracentesis with administration of 25 g of albumin early next week, and scheduled another paracentesis for later the same week.  We will get a cell count drawn on the fluid to rule out SBP.  Do not take diuretic the day of paracentesis. CBC, CMP, INR,  AFP Echocardiogram CT abdomen and pelvis with oral and IV contrast the week after his paracentesis is performed, so we do not stress his kidneys with IV contrast while we are also doing paracentesis.  Once we get some better control of the ascites and understand whether or not there is a clot or tumor or cardiac cause for this decompensation, we can then determine whether or not a TIPS might be appropriate.  I had an initial discussion about this procedure, and we will discuss it more after test results.  His meld score does not currently warrant evaluation by liver transplant clinic. Total time 45 minutes, over half spent face-to-face with patient in counseling and coordination of care.   Charlie PitterHenry L Danis III

## 2018-04-19 ENCOUNTER — Other Ambulatory Visit: Payer: Self-pay

## 2018-04-19 ENCOUNTER — Ambulatory Visit (HOSPITAL_COMMUNITY)
Admission: RE | Admit: 2018-04-19 | Discharge: 2018-04-19 | Disposition: A | Discharge status: Home / Self Care | Payer: 59 | Source: Ambulatory Visit | Attending: Gastroenterology | Admitting: Gastroenterology

## 2018-04-19 ENCOUNTER — Encounter (HOSPITAL_COMMUNITY): Payer: Self-pay | Admitting: Student

## 2018-04-19 DIAGNOSIS — K7031 Alcoholic cirrhosis of liver with ascites: Secondary | ICD-10-CM | POA: Insufficient documentation

## 2018-04-19 DIAGNOSIS — R945 Abnormal results of liver function studies: Principal | ICD-10-CM

## 2018-04-19 DIAGNOSIS — D689 Coagulation defect, unspecified: Secondary | ICD-10-CM

## 2018-04-19 DIAGNOSIS — D696 Thrombocytopenia, unspecified: Secondary | ICD-10-CM

## 2018-04-19 DIAGNOSIS — R7989 Other specified abnormal findings of blood chemistry: Secondary | ICD-10-CM

## 2018-04-19 DIAGNOSIS — I8510 Secondary esophageal varices without bleeding: Secondary | ICD-10-CM

## 2018-04-19 HISTORY — PX: IR PARACENTESIS: IMG2679

## 2018-04-19 LAB — HEPATITIS B SURFACE ANTIBODY,QUALITATIVE: HEP B S AB: NONREACTIVE

## 2018-04-19 LAB — HEPATITIS A ANTIBODY, TOTAL: HEPATITIS A AB,TOTAL: REACTIVE — AB

## 2018-04-19 LAB — AFP TUMOR MARKER: AFP TUMOR MARKER: 2 ng/mL (ref <6.1)

## 2018-04-19 MED ORDER — LIDOCAINE HCL (PF) 2 % IJ SOLN
INTRAMUSCULAR | Status: AC
  Filled 2018-04-19: qty 20

## 2018-04-19 MED ORDER — LIDOCAINE HCL (PF) 1 % IJ SOLN
INTRAMUSCULAR | Status: DC | PRN
  Administered 2018-04-19: 20 mL

## 2018-04-19 MED ORDER — ALBUMIN NICU 25% IV SOLUTION
25.0000 g | Freq: Once | INTRAVENOUS | Status: DC
  Filled 2018-04-19: qty 100

## 2018-04-19 MED ORDER — ALBUMIN HUMAN 25 % IV SOLN
INTRAVENOUS | Status: AC
  Administered 2018-04-19: 25 g
  Filled 2018-04-19: qty 100

## 2018-04-19 NOTE — Procedures (Signed)
PROCEDURE SUMMARY:  Successful image-guided paracentesis from the right lower abdomen.  Yielded 9.8 liters of clear gold fluid.  No immediate complications.  Patient tolerated well.   Patient to receive Albumin with procedure today.  Specimen was not sent for labs.  Talbert Trembath PA-C 04/19/2018 11:08 AM

## 2018-04-23 ENCOUNTER — Telehealth: Payer: Self-pay | Admitting: Gastroenterology

## 2018-04-23 ENCOUNTER — Telehealth: Payer: Self-pay

## 2018-04-23 ENCOUNTER — Ambulatory Visit (HOSPITAL_COMMUNITY): Admission: RE | Admit: 2018-04-23 | Payer: 59 | Source: Ambulatory Visit

## 2018-04-23 NOTE — Telephone Encounter (Signed)
Let patient know that CT did receive the prior authorization. He still has not picked up the contrast, reminded him he needs to come get it here at our office by 5:00 today.

## 2018-04-23 NOTE — Telephone Encounter (Signed)
Spoke to patient and his ride did not show up today to take him to his paracentesis. He contacted [facility] medical day and rescheduled his paracentesis to Monday 7/1. He also has his CT that day in the morning. Per Amy H, insurance has this request for CT under review and prior authorization is pending. Patient is aware of this and will notify him once I hear back.

## 2018-04-25 NOTE — Telephone Encounter (Signed)
He needs the paracentesis before the CT scan, and CANNOT have them the same day due to risk of stressing his kidneys.  CT should be 2-3 days after paracentesis, pending insurance approval. Then I will plan follow up OV to evaluate ascites.

## 2018-04-26 ENCOUNTER — Other Ambulatory Visit (HOSPITAL_COMMUNITY): Payer: 59

## 2018-04-26 ENCOUNTER — Other Ambulatory Visit: Payer: Self-pay

## 2018-04-26 ENCOUNTER — Encounter (HOSPITAL_COMMUNITY): Payer: Self-pay

## 2018-04-26 ENCOUNTER — Ambulatory Visit (HOSPITAL_COMMUNITY)
Admission: RE | Admit: 2018-04-26 | Discharge: 2018-04-26 | Disposition: A | Discharge status: Home / Self Care | Payer: 59 | Source: Ambulatory Visit | Attending: Gastroenterology | Admitting: Gastroenterology

## 2018-04-26 DIAGNOSIS — K766 Portal hypertension: Secondary | ICD-10-CM | POA: Insufficient documentation

## 2018-04-26 DIAGNOSIS — R7989 Other specified abnormal findings of blood chemistry: Secondary | ICD-10-CM | POA: Diagnosis not present

## 2018-04-26 DIAGNOSIS — K429 Umbilical hernia without obstruction or gangrene: Secondary | ICD-10-CM | POA: Insufficient documentation

## 2018-04-26 DIAGNOSIS — K7031 Alcoholic cirrhosis of liver with ascites: Secondary | ICD-10-CM

## 2018-04-26 DIAGNOSIS — K769 Liver disease, unspecified: Secondary | ICD-10-CM

## 2018-04-26 DIAGNOSIS — I700 Atherosclerosis of aorta: Secondary | ICD-10-CM | POA: Diagnosis not present

## 2018-04-26 DIAGNOSIS — R945 Abnormal results of liver function studies: Secondary | ICD-10-CM

## 2018-04-26 DIAGNOSIS — R161 Splenomegaly, not elsewhere classified: Secondary | ICD-10-CM | POA: Insufficient documentation

## 2018-04-26 DIAGNOSIS — K4020 Bilateral inguinal hernia, without obstruction or gangrene, not specified as recurrent: Secondary | ICD-10-CM | POA: Diagnosis not present

## 2018-04-26 MED ORDER — IOPAMIDOL (ISOVUE-300) INJECTION 61%
INTRAVENOUS | Status: AC
  Filled 2018-04-26: qty 100

## 2018-04-26 MED ORDER — IOPAMIDOL (ISOVUE-300) INJECTION 61%
100.0000 mL | Freq: Once | INTRAVENOUS | Status: AC | PRN
  Administered 2018-04-26: 100 mL via INTRAVENOUS

## 2018-04-26 NOTE — Telephone Encounter (Signed)
Rescheduled IR paracentesis for 7/3 at 10:00 [facility], max 5 liters with IV Albumin 25 gm, also added cell count. Left message on patient cell number with details. Will call him later to make sure he understands it is rescheduled.

## 2018-04-27 ENCOUNTER — Ambulatory Visit (HOSPITAL_COMMUNITY)
Admission: RE | Admit: 2018-04-27 | Discharge: 2018-04-27 | Disposition: A | Discharge status: Home / Self Care | Payer: 59 | Source: Ambulatory Visit | Attending: Gastroenterology | Admitting: Gastroenterology

## 2018-04-27 ENCOUNTER — Encounter (HOSPITAL_COMMUNITY): Payer: Self-pay | Admitting: Interventional Radiology

## 2018-04-27 ENCOUNTER — Ambulatory Visit (HOSPITAL_BASED_OUTPATIENT_CLINIC_OR_DEPARTMENT_OTHER): Payer: 59

## 2018-04-27 ENCOUNTER — Other Ambulatory Visit: Payer: Self-pay

## 2018-04-27 DIAGNOSIS — D696 Thrombocytopenia, unspecified: Secondary | ICD-10-CM

## 2018-04-27 DIAGNOSIS — R590 Localized enlarged lymph nodes: Secondary | ICD-10-CM | POA: Insufficient documentation

## 2018-04-27 DIAGNOSIS — R161 Splenomegaly, not elsewhere classified: Secondary | ICD-10-CM | POA: Insufficient documentation

## 2018-04-27 DIAGNOSIS — K769 Liver disease, unspecified: Secondary | ICD-10-CM | POA: Diagnosis not present

## 2018-04-27 DIAGNOSIS — K7031 Alcoholic cirrhosis of liver with ascites: Secondary | ICD-10-CM

## 2018-04-27 DIAGNOSIS — I8510 Secondary esophageal varices without bleeding: Secondary | ICD-10-CM | POA: Diagnosis not present

## 2018-04-27 DIAGNOSIS — D689 Coagulation defect, unspecified: Secondary | ICD-10-CM | POA: Insufficient documentation

## 2018-04-27 HISTORY — PX: IR PARACENTESIS: IMG2679

## 2018-04-27 LAB — BODY FLUID CELL COUNT WITH DIFFERENTIAL
EOS FL: 1 %
LYMPHS FL: 25 %
Monocyte-Macrophage-Serous Fluid: 56 % (ref 50–90)
Neutrophil Count, Fluid: 18 % (ref 0–25)
Total Nucleated Cell Count, Fluid: 25 cu mm (ref 0–1000)

## 2018-04-27 MED ORDER — LIDOCAINE HCL (PF) 1 % IJ SOLN
INTRAMUSCULAR | Status: DC | PRN
  Administered 2018-04-27: 10 mL

## 2018-04-27 MED ORDER — ALBUMIN HUMAN 25 % IV SOLN
25.0000 g | Freq: Once | INTRAVENOUS | Status: AC
  Administered 2018-04-27: 25 g via INTRAVENOUS
  Filled 2018-04-27: qty 100

## 2018-04-27 MED ORDER — LIDOCAINE HCL 1 % IJ SOLN
INTRAMUSCULAR | Status: AC
  Filled 2018-04-27: qty 20

## 2018-04-27 MED ORDER — ALBUMIN HUMAN 25 % IV SOLN
INTRAVENOUS | Status: AC
  Administered 2018-04-27: 25 g via INTRAVENOUS
  Filled 2018-04-27: qty 100

## 2018-04-28 ENCOUNTER — Telehealth: Payer: Self-pay

## 2018-04-28 ENCOUNTER — Encounter: Payer: Self-pay | Admitting: Family Medicine

## 2018-04-28 ENCOUNTER — Ambulatory Visit (HOSPITAL_COMMUNITY): Admission: RE | Admit: 2018-04-28 | Payer: 59 | Source: Ambulatory Visit

## 2018-04-28 ENCOUNTER — Ambulatory Visit: Payer: 59 | Attending: Family Medicine | Admitting: Family Medicine

## 2018-04-28 ENCOUNTER — Other Ambulatory Visit: Payer: Self-pay

## 2018-04-28 ENCOUNTER — Encounter

## 2018-04-28 VITALS — BP 106/65 | HR 69 | Temp 97.6°F | Ht 66.0 in | Wt 147.4 lb

## 2018-04-28 DIAGNOSIS — R410 Disorientation, unspecified: Secondary | ICD-10-CM | POA: Diagnosis not present

## 2018-04-28 DIAGNOSIS — R400 Somnolence: Secondary | ICD-10-CM | POA: Insufficient documentation

## 2018-04-28 DIAGNOSIS — K7031 Alcoholic cirrhosis of liver with ascites: Secondary | ICD-10-CM

## 2018-04-28 DIAGNOSIS — Z79899 Other long term (current) drug therapy: Secondary | ICD-10-CM | POA: Insufficient documentation

## 2018-04-28 DIAGNOSIS — R634 Abnormal weight loss: Secondary | ICD-10-CM | POA: Insufficient documentation

## 2018-04-28 DIAGNOSIS — F325 Major depressive disorder, single episode, in full remission: Secondary | ICD-10-CM

## 2018-04-28 MED ORDER — FLUOXETINE HCL 40 MG PO CAPS: 40.0000 mg | ORAL_CAPSULE | Freq: Every day | ORAL | 3 refills | Status: DC

## 2018-04-28 MED ORDER — LACTULOSE 10 GM/15ML PO SOLN: 10.0000 g | Freq: Every day | ORAL | 3 refills | Status: DC

## 2018-04-28 MED FILL — LACTULOSE 10 GM/15 ML SOLN: 10 | 16 days supply | Qty: 240 | Fill #0

## 2018-04-28 NOTE — Progress Notes (Signed)
Patient has disability paperwork to be filled out. 

## 2018-04-28 NOTE — Progress Notes (Signed)
Subjective:  Patient ID: Kenneth [name], male    DOB: 1961/05/27  Age: 57 y.o. MRN: [id number]  CC: Ascites   HPI Kenneth [name] is a 57 year old male with a history of alcoholic cirrhosis of the liver , thrombocytopenia, previous history of SMV thrombosis , depression here for a follow up visit accompanied by his sister.  He complains of somnolence which he describes as being able to fall asleep any time and he endorses some confusion.  He has had worsening abdominal distention and yesterday he underwent IR paracentesis with removal of 5 L of fluid and administration of 25 g of albumin.  Prior to that he underwent paracentesis on 04/19/2018, 04/08/2018. Last seen by gastroenterology-Dr. Wilfrid Lund on 04/16/2018 possible consideration for TIPS. He would like his short-term disability paperwork completed today as he has been unable to work at his job in [place] [employer]; he gets short of breath, has a reduced appetite and is fatigued and has also lost about 20 pounds recently. Echocardiogram from yesterday revealed EF of 60 to 65%, no regional wall motion abnormalities  CT abdomen and pelvis revealed findings below: IMPRESSION: 1. Cirrhosis with stigmata of portal hypertension, including dilated portal vein and splenomegaly, as detailed above. There is a potential hypervascular area in the inferior aspect of the left lobe of the liver which is concerning for potential neoplasm such as hepatocellular carcinoma. However, this may simply reflect a benign perfusion anomaly. This is poorly evaluated on today's examination, and follow-up with MRI of the abdomen with and without IV gadolinium is strongly recommended in the near future to better evaluate this finding. Correlation with AFP level is also recommended. 2. Large volume of ascites. 3. Aortic atherosclerosis. 4. Umbilical hernia and bilateral inguinal hernias (right greater than left), containing ascites. 5. Additional incidental findings, as  above.  Past Medical History:  Diagnosis Date  . Alcohol abuse   . Anxiety   . Blood clot in vein   . Cirrhosis ([place])   . Depression   . Esophageal varices ([place]) 09/02/2014  . H/O [facility] spotted fever    age 61 or 37  . Hiatal hernia 09/02/2014  . Polysubstance abuse ([place])   . Portal hypertension with esophageal varices (HCC) 09/02/2014  . Portal hypertensive gastropathy ([place]) 09/02/2014  . Thrombocytopenia ([place]) 09/02/2014  . Upper GI bleed 08/31/2014    Past Surgical History:  Procedure Laterality Date  . ESOPHAGOGASTRODUODENOSCOPY Left 09/01/2014   Procedure: ESOPHAGOGASTRODUODENOSCOPY (EGD);  Surgeon: Juanita Craver, MD;  Location: [facility];  Service: Endoscopy;  Laterality: Left;  . EYE SURGERY Left    age 64  . IR PARACENTESIS  03/16/2018  . IR PARACENTESIS  03/29/2018  . IR PARACENTESIS  04/19/2018  . IR PARACENTESIS  04/27/2018    Allergies  Allergen Reactions  . Latex Swelling and Rash     Outpatient Medications Prior to Visit  Medication Sig Dispense Refill  . diclofenac sodium (VOLTAREN) 1 % GEL Apply 4 g topically 4 (four) times daily. 100 g 0  . furosemide (LASIX) 40 MG tablet Take 1 tablet (40 mg total) by mouth daily. 30 tablet 5  . Multiple Vitamin (MULTIVITAMIN WITH MINERALS) TABS tablet Take 0.5 tablets by mouth as needed.     . ondansetron (ZOFRAN) 4 MG tablet TAKE 1 TABLET BY MOUTH 2 TIMES DAILY AS NEEDED FOR NAUSEA OR VOMITING. 30 tablet 0  . propranolol (INDERAL) 10 MG tablet Take 0.5 tablets (5 mg total) by mouth 3 (three) times daily.  90 tablet 3  . FLUoxetine (PROZAC) 40 MG capsule Take 1 capsule (40 mg total) by mouth daily. 30 capsule 3  . traZODone (DESYREL) 100 MG tablet Take 1 tablet (100 mg total) by mouth at bedtime as needed. for insomnia 30 tablet 5   No facility-administered medications prior to visit.     ROS Review of Systems  Constitutional: Positive for activity change, appetite change, fatigue and unexpected weight change.    HENT: Negative for sinus pressure and sore throat.   Eyes: Negative for visual disturbance.  Respiratory: Positive for shortness of breath. Negative for cough and chest tightness.   Cardiovascular: Negative for chest pain and leg swelling.  Gastrointestinal: Positive for abdominal distention. Negative for abdominal pain, constipation and diarrhea.  Endocrine: Negative.   Genitourinary: Negative for dysuria.  Musculoskeletal: Negative for joint swelling and myalgias.  Skin: Negative for rash.  Allergic/Immunologic: Negative.   Neurological: Positive for weakness. Negative for light-headedness and numbness.  Psychiatric/Behavioral: Negative for dysphoric mood and suicidal ideas.    Objective:  BP 106/65   Pulse 69   Temp 97.6 F (36.4 C) (Oral)   Ht '5\' 6"'  (1.676 m)   Wt 147 lb 6.4 oz (66.9 kg)   SpO2 93%   BMI 23.79 kg/m   BP/Weight 04/28/2018 04/27/2018 06/19/2352  Systolic BP 614 95 98  Diastolic BP 65 69 70  Wt. (Lbs) 147.4 - 170.25  BMI 23.79 - 26.87      Physical Exam  Constitutional: He is oriented to person, place, and time.  Thin   Cardiovascular: Normal rate, normal heart sounds and intact distal pulses.  No murmur heard. Pulmonary/Chest: Effort normal and breath sounds normal. He has no wheezes. He has no rales. He exhibits no tenderness.  Abdominal: Soft. Bowel sounds are normal. He exhibits distension. He exhibits no mass. There is no tenderness.  Musculoskeletal: Normal range of motion.  Neurological: He is alert and oriented to person, place, and time.  Skin: Skin is warm and dry.     CMP Latest Ref Rng & Units 04/16/2018 03/15/2018 02/26/2018  Glucose 70 - 99 mg/dL 97 84 87  BUN 6 - 23 mg/dL '7 6 6  ' Creatinine 0.40 - 1.50 mg/dL 0.69 0.73(L) 0.68  Sodium 135 - 145 mEq/L 132(L) 139 139  Potassium 3.5 - 5.1 mEq/L 3.6 3.9 3.6  Chloride 96 - 112 mEq/L 99 102 105  CO2 19 - 32 mEq/L '29 24 25  ' Calcium 8.4 - 10.5 mg/dL 8.7 8.2(L) 8.4(L)  Total Protein 6.0 - 8.3  g/dL 7.5 6.2 7.1  Total Bilirubin 0.2 - 1.2 mg/dL 2.3(H) 1.0 2.0(H)  Alkaline Phos 39 - 117 U/L 157(H) 141(H) 124  AST 0 - 37 U/L 63(H) 60(H) 59(H)  ALT 0 - 53 U/L 33 33 35     CBC    Component Value Date/Time   WBC 5.7 04/16/2018 1558   RBC 4.84 04/16/2018 1558   HGB 15.1 04/16/2018 1558   HGB 12.5 (L) 03/15/2018 1421   HCT 44.4 04/16/2018 1558   HCT 38.8 03/15/2018 1421   PLT 91.0 (L) 04/16/2018 1558   PLT 85 (LL) 03/15/2018 1421   MCV 91.7 04/16/2018 1558   MCV 93 03/15/2018 1421   MCH 29.9 03/15/2018 1421   MCH 31.1 02/26/2018 1605   MCHC 34.1 04/16/2018 1558   RDW 16.5 (H) 04/16/2018 1558   RDW 15.4 03/15/2018 1421   LYMPHSABS 0.6 (L) 04/16/2018 1558   LYMPHSABS 0.5 (L) 03/15/2018 1421   MONOABS  1.1 (H) 04/16/2018 1558   EOSABS 0.2 04/16/2018 1558   EOSABS 0.2 03/15/2018 1421   BASOSABS 0.0 04/16/2018 1558   BASOSABS 0.0 03/15/2018 1421      Assessment & Plan:   1. Alcoholic cirrhosis of liver with ascites ([place]) With evidence of current decompensation especially with somnolence and confusion Status post IR paracentesis yesterday We will send of ammonia and commence lactulose I have completed his short-term disability paperwork TIPS a possible consideration Keep appointment with GI CT abdomen and pelvis concerning for malignancy; keep appointment for MRI of the liver We will send of AFP - Ammonia - CMP14+EGFR - AFP tumor marker  2. Confusion Suspicious for encephalopathy - lactulose (CHRONULAC) 10 GM/15ML solution; Take 15 mLs (10 g total) by mouth daily.  Dispense: 240 mL; Refill: 3  3. Major depressive disorder with single episode, in full remission (HCC) Stable - FLUoxetine (PROZAC) 40 MG capsule; Take 1 capsule (40 mg total) by mouth daily.  Dispense: 30 capsule; Refill: 3  4. Weight loss He has a poor appetite due to recurrent ascites  5. Somnolence I have discontinued trazodone as he no longer needs it   Meds ordered this encounter    Medications  . lactulose (CHRONULAC) 10 GM/15ML solution    Sig: Take 15 mLs (10 g total) by mouth daily.    Dispense:  240 mL    Refill:  3  . FLUoxetine (PROZAC) 40 MG capsule    Sig: Take 1 capsule (40 mg total) by mouth daily.    Dispense:  30 capsule    Refill:  3    Follow-up: Return in about 3 months (around 07/29/2018) for follow up of chronic medical conditions.   Charlott Rakes MD

## 2018-04-28 NOTE — Patient Instructions (Signed)
Cirrhosis Cirrhosis is long-term (chronic) liver injury. The liver is your largest internal organ, and it performs many functions. The liver converts food into energy, removes toxic material from your blood, makes important proteins, and absorbs necessary vitamins from your diet. If you have cirrhosis, it means many of your healthy liver cells have been replaced by scar tissue. This prevents blood from flowing through your liver, which makes it difficult for your liver to function. This scarring is not reversible, but treatment can prevent it from getting worse. What are the causes? Hepatitis C and long-term alcohol abuse are the most common causes of cirrhosis. Other causes include:  Nonalcoholic fatty liver disease.  Hepatitis B infection.  Autoimmune hepatitis.  Diseases that cause blockage of ducts inside the liver.  Inherited liver diseases.  Reactions to certain long-term medicines.  Parasitic infections.  Long-term exposure to certain toxins.  What increases the risk? You may have a higher risk of cirrhosis if you:  Have certain hepatitis viruses.  Abuse alcohol, especially if you are male.  Are overweight.  Share needles.  Have unprotected sex with someone who has hepatitis.  What are the signs or symptoms? You may not have any signs and symptoms at first. Symptoms may not develop until the damage to your liver starts to get worse. Signs and symptoms of cirrhosis may include:  Tenderness in the right-upper part of your abdomen.  Weakness and tiredness (fatigue).  Loss of appetite.  Nausea.  Weight loss and muscle loss.  Itchiness.  Yellow skin and eyes (jaundice).  Buildup of fluid in the abdomen (ascites).  Swelling of the feet and ankles (edema).  Appearance of tiny blood vessels under the skin.  Mental confusion.  Easy bruising and bleeding.  How is this diagnosed? Your health care provider may suspect cirrhosis based on your symptoms and  medical history, especially if you have other medical conditions or a history of alcohol abuse. Your health care provider will do a physical exam to feel your liver and check for signs of cirrhosis. Your health care provider may perform other tests, including:  Blood tests to check: ? Whether you have hepatitis B or C. ? Kidney function. ? Liver function.  Imaging tests such as: ? MRI or CT scan to look for changes seen in advanced cirrhosis. ? Ultrasound to see if normal liver tissue is being replaced by scar tissue.  A procedure using a long needle to take a sample of liver tissue (biopsy) for examination under a microscope. Liver biopsy can confirm the diagnosis of cirrhosis.  How is this treated? Treatment depends on how damaged your liver is and what caused the damage. Treatment may include treating cirrhosis symptoms or treating the underlying causes of the condition to try to slow the progression of the damage. Treatment may include:  Making lifestyle changes, such as: ? Eating a healthy diet. ? Restricting salt intake. ? Maintaining a healthy weight. ? Not abusing drugs or alcohol.  Taking medicines to: ? Treat liver infections or other infections. ? Control itching. ? Reduce fluid buildup. ? Reduce certain blood toxins. ? Reduce risk of bleeding from enlarged blood vessels in the stomach or esophagus (varices).  If varices are causing bleeding problems, you may need treatment with a procedure that ties up the vessels causing them to fall off (band ligation).  If cirrhosis is causing your liver to fail, your health care provider may recommend a liver transplant.  Other treatments may be recommended depending on any complications   of cirrhosis, such as liver-related kidney failure (hepatorenal syndrome).  Follow these instructions at home:  Take medicines only as directed by your health care provider. Do not use drugs that are toxic to your liver. Ask your health care  provider before taking any new medicines, including over-the-counter medicines.  Rest as needed.  Eat a well-balanced diet. Ask your health care provider or dietitian for more information.  You may have to follow a low-salt diet or restrict your water intake as directed.  Do not drink alcohol. This is especially important if you are taking acetaminophen.  Keep all follow-up visits as directed by your health care provider. This is important. Contact a health care provider if:  You have fatigue or weakness that is getting worse.  You develop swelling of the hands, feet, legs, or face.  You have a fever.  You develop loss of appetite.  You have nausea or vomiting.  You develop jaundice.  You develop easy bruising or bleeding. Get help right away if:  You vomit bright red blood or a material that looks like coffee grounds.  You have blood in your stools.  Your stools appear black and tarry.  You become confused.  You have chest pain or trouble breathing. This information is not intended to replace advice given to you by your health care provider. Make sure you discuss any questions you have with your health care provider. Document Released: 10/13/2005 Document Revised: 02/21/2016 Document Reviewed: 06/21/2014 Elsevier Interactive Patient Education  2018 [employer]  

## 2018-04-28 NOTE — Telephone Encounter (Signed)
Met with the patient and his sister, Barnetta Chapel, when he was in the clinic for an appointment today. Assisted them with completing the SCAT application. The patient stated that he lives about 5 miles from the nearest [place] bus stop.  Instructed him to call [facility] to inquire if he has a benefit for transportation to his medical appointments.

## 2018-04-29 LAB — CMP14+EGFR
A/G RATIO: 0.9 — AB (ref 1.2–2.2)
ALK PHOS: 165 IU/L — AB (ref 39–117)
ALT: 36 IU/L (ref 0–44)
AST: 80 IU/L — AB (ref 0–40)
Albumin: 3.2 g/dL — ABNORMAL LOW (ref 3.5–5.5)
BILIRUBIN TOTAL: 2 mg/dL — AB (ref 0.0–1.2)
BUN/Creatinine Ratio: 13 (ref 9–20)
BUN: 9 mg/dL (ref 6–24)
CHLORIDE: 101 mmol/L (ref 96–106)
CO2: 25 mmol/L (ref 20–29)
Calcium: 8.6 mg/dL — ABNORMAL LOW (ref 8.7–10.2)
Creatinine, Ser: 0.67 mg/dL — ABNORMAL LOW (ref 0.76–1.27)
GFR calc Af Amer: 123 mL/min/{1.73_m2} (ref >59)
GFR calc non Af Amer: 107 mL/min/{1.73_m2} (ref >59)
GLUCOSE: 78 mg/dL (ref 65–99)
Globulin, Total: 3.4 g/dL (ref 1.5–4.5)
POTASSIUM: 4.2 mmol/L (ref 3.5–5.2)
Sodium: 138 mmol/L (ref 134–144)
Total Protein: 6.6 g/dL (ref 6.0–8.5)

## 2018-04-29 LAB — AFP TUMOR MARKER: AFP, SERUM, TUMOR MARKER: 2.2 ng/mL (ref 0.0–8.3)

## 2018-04-29 LAB — AMMONIA: AMMONIA: 79 ug/dL (ref 27–102)

## 2018-04-29 LAB — PATHOLOGIST SMEAR REVIEW

## 2018-04-30 ENCOUNTER — Ambulatory Visit (HOSPITAL_COMMUNITY)
Admission: RE | Admit: 2018-04-30 | Discharge: 2018-04-30 | Disposition: A | Discharge status: Home / Self Care | Payer: 59 | Source: Ambulatory Visit | Attending: Gastroenterology | Admitting: Gastroenterology

## 2018-04-30 ENCOUNTER — Telehealth: Payer: Self-pay

## 2018-04-30 DIAGNOSIS — K769 Liver disease, unspecified: Secondary | ICD-10-CM

## 2018-04-30 DIAGNOSIS — K7031 Alcoholic cirrhosis of liver with ascites: Secondary | ICD-10-CM

## 2018-04-30 MED ORDER — GADOXETATE DISODIUM 0.25 MMOL/ML IV SOLN
10.0000 mL | Freq: Once | INTRAVENOUS | Status: AC | PRN
  Administered 2018-04-30: 7 mL via INTRAVENOUS

## 2018-04-30 NOTE — Telephone Encounter (Signed)
Completed SCAT application faxed to SCAT eligibility.  

## 2018-05-05 ENCOUNTER — Other Ambulatory Visit: Payer: Self-pay | Admitting: Gastroenterology

## 2018-05-05 ENCOUNTER — Encounter: Payer: Self-pay | Admitting: Gastroenterology

## 2018-05-05 ENCOUNTER — Ambulatory Visit (INDEPENDENT_AMBULATORY_CARE_PROVIDER_SITE_OTHER): Payer: 59 | Admitting: Gastroenterology

## 2018-05-05 VITALS — BP 90/52 | HR 68 | Ht 66.75 in | Wt 153.4 lb

## 2018-05-05 DIAGNOSIS — R7989 Other specified abnormal findings of blood chemistry: Secondary | ICD-10-CM

## 2018-05-05 DIAGNOSIS — K766 Portal hypertension: Secondary | ICD-10-CM

## 2018-05-05 DIAGNOSIS — K7031 Alcoholic cirrhosis of liver with ascites: Secondary | ICD-10-CM

## 2018-05-05 DIAGNOSIS — I8500 Esophageal varices without bleeding: Secondary | ICD-10-CM

## 2018-05-05 DIAGNOSIS — I959 Hypotension, unspecified: Secondary | ICD-10-CM

## 2018-05-05 DIAGNOSIS — D689 Coagulation defect, unspecified: Secondary | ICD-10-CM | POA: Diagnosis not present

## 2018-05-05 DIAGNOSIS — R945 Abnormal results of liver function studies: Secondary | ICD-10-CM | POA: Diagnosis not present

## 2018-05-05 DIAGNOSIS — K769 Liver disease, unspecified: Secondary | ICD-10-CM | POA: Diagnosis not present

## 2018-05-05 DIAGNOSIS — D696 Thrombocytopenia, unspecified: Secondary | ICD-10-CM

## 2018-05-05 MED ORDER — AMILORIDE HCL 5 MG PO TABS: 5.0000 mg | ORAL_TABLET | Freq: Every day | ORAL | 1 refills | Status: DC

## 2018-05-05 MED ORDER — SPIRONOLACTONE 100 MG PO TABS: 100.0000 mg | ORAL_TABLET | Freq: Every day | ORAL | 1 refills | Status: DC

## 2018-05-05 MED FILL — aMILoride HCL 5 MG TABS: 5 | 30 days supply | Qty: 30 | Fill #0

## 2018-05-05 NOTE — Patient Instructions (Addendum)
Please STOP your propranolol. ____________________________________________________________________ Bonita QuinYou have been scheduled for an abdominal paracentesis at [facility] (1st floor of hospital) on Monday, 05/10/18 at 10:00 am. Please arrive at least 15 minutes prior to your appointment time for registration. Should you need to reschedule this appointment for any reason, please call our office at [phone].  _____________________________________________________________________ You have been scheduled for an appointment with Interventional Radiology Clinic at [facility] on Thursday, 05/13/18. Please arrive at 7:45 am for your appointment.  Office address is: [address], [place]  [address] (1st floor)  Office Phone: [phone] (Vicki-scheduler-- works Monday thru Thursday) _____________________________________________________________________ We have sent the following medications to your pharmacy for you to pick up at your convenience: Amiloride 5 mg once daily (start this on Wednesday, 05/12/18) ______________________________________________________________________ If you are age 57 or older, your body mass index should be between 23-30. Your Body mass index is 24.21 kg/m. If this is out of the aforementioned range listed, please consider follow up with your Primary Care Provider.  If you are age 57 or younger, your body mass index should be between 19-25. Your Body mass index is 24.21 kg/m. If this is out of the aformentioned range listed, please consider follow up with your Primary Care Provider.   It was a pleasure to see you today!  Dr. Myrtie Neitheranis

## 2018-05-05 NOTE — Progress Notes (Signed)
[facility] Progress Note  Chief Complaint: Cirrhosis with large volume ascites  Subjective  History:  Asenc[facility] follows up after recent reevaluation of his cirrhosis with worsening of ascites over the last few months.  He underwent a 10 L paracentesis on June 24, and a 5 L paracentesis on July 2.   He is less dyspneic and uncomfortable than when I recently saw him, but still has a large volume of ascites. His brother is with him today and had done a lot of Internet research and came prepared with many questions.  ROS: Cardiovascular:  no chest pain Respiratory: Chronic dyspnea with exertion Chronic depression but mood stable lately Remainder of systems negative except as above  The patient's Past Medical, Family and Social History were reviewed and are on file in the EMR.  Objective:  Med list reviewed  Current Outpatient Medications:  .  diclofenac sodium (VOLTAREN) 1 % GEL, Apply 4 g topically 4 (four) times daily., Disp: 100 g, Rfl: 0 .  FLUoxetine (PROZAC) 40 MG capsule, Take 1 capsule (40 mg total) by mouth daily., Disp: 30 capsule, Rfl: 3 .  furosemide (LASIX) 40 MG tablet, Take 1 tablet (40 mg total) by mouth daily., Disp: 30 tablet, Rfl: 5 .  lactulose (CHRONULAC) 10 GM/15ML solution, Take 15 mLs (10 g total) by mouth daily., Disp: 240 mL, Rfl: 3 .  Multiple Vitamin (MULTIVITAMIN WITH MINERALS) TABS tablet, Take 0.5 tablets by mouth as needed. , Disp: , Rfl:  .  ondansetron (ZOFRAN) 4 MG tablet, TAKE 1 TABLET BY MOUTH 2 TIMES DAILY AS NEEDED FOR NAUSEA OR VOMITING., Disp: 30 tablet, Rfl: 0 .  aMILoride (MIDAMOR) 5 MG tablet, Take 1 tablet (5 mg total) by mouth daily., Disp: 30 tablet, Rfl: 1   Vital signs in last 24 hrs: Vitals:   05/05/18 1531  BP: (!) 90/52  Pulse: 68    Physical Exam  Chronically ill as before with poor muscle mass  HEENT: sclera anicteric, oral mucosa moist without lesions  Neck: supple, no thyromegaly, JVD or  lymphadenopathy  Cardiac: RRR without murmurs, S1S2 heard, no peripheral edema  Pulm: clear to auscultation bilaterally, normal RR and effort noted  Abdomen: soft, no tenderness, with active bowel sounds.  Left lobe liver enlarged.  Bulging flanks and shifting dullness with protuberant abdomen causing prominence of umbilical hernia with purple discoloration.  It is easily reducible.  Skin; warm and dry, no jaundice or rash  Recent Labs:  WBC count 25 on the ascitic fluid from July 2  CBC Latest Ref Rng & Units 04/16/2018 03/15/2018 02/26/2018  WBC 4.0 - 10.5 K/uL 5.7 4.1 6.0  Hemoglobin 13.0 - 17.0 g/dL 40.9 12.5(L) 13.0  Hematocrit 39.0 - 52.0 % 44.4 38.8 38.3(L)  Platelets 150.0 - 400.0 K/uL 91.0(L) 85(LL) 85(L)   CMP Latest Ref Rng & Units 04/28/2018 04/16/2018 03/15/2018  Glucose 65 - 99 mg/dL 78 97 84  BUN 6 - 24 mg/dL 9 7 6   Creatinine 0.76 - 1.27 mg/dL 8.11(B) 1.47 8.29(F)  Sodium 134 - 144 mmol/L 138 132(L) 139  Potassium 3.5 - 5.2 mmol/L 4.2 3.6 3.9  Chloride 96 - 106 mmol/L 101 99 102  CO2 20 - 29 mmol/L 25 29 24   Calcium 8.7 - 10.2 mg/dL 6.2(Z) 8.7 3.0(Q)  Total Protein 6.0 - 8.5 g/dL 6.6 7.5 6.2  Total Bilirubin 0.0 - 1.2 mg/dL 2.0(H) 2.3(H) 1.0  Alkaline Phos 39 - 117 IU/L 165(H) 157(H) 141(H)  AST 0 - 40  IU/L 80(H) 63(H) 60(H)  ALT 0 - 44 IU/L 36 33 33   04/16/18  INR 1.6 (up from 1.4 on 5/31) AFP 2.2 MELD-Na = 14 Radiologic studies:  04/26/18 CTAP:  IMPRESSION: 1. Cirrhosis with stigmata of portal hypertension, including dilated portal vein and splenomegaly, as detailed above. There is a potential hypervascular area in the inferior aspect of the left lobe of the liver which is concerning for potential neoplasm such as hepatocellular carcinoma. However, this may simply reflect a benign perfusion anomaly. This is poorly evaluated on today's examination, and follow-up with MRI of the abdomen with and without IV gadolinium is strongly recommended in the near future  to better evaluate this finding. Correlation with AFP level is also recommended. 2. Large volume of ascites. 3. Aortic atherosclerosis. 4. Umbilical hernia and bilateral inguinal hernias (right greater than left), containing ascites. 5. Additional incidental findings, as above.   Also noted is that the portal vein is dilated without clot _______________________________________________________ MRI liver 7/10; IMPRESSION: 1. Advanced cirrhotic changes in the liver, with areas of confluent hepatic fibrosis, as above. The area of concern in the inferior aspect of the left lobe of the liver does not demonstrate definitive hypervascular enhancement or other imaging findings to suggest hepatocellular carcinoma at this time. 2. Splenomegaly. 3. Large volume of ascites. 4. Nonspecific upper abdominal and retroperitoneal lymphadenopathy similar to the prior study, as above. Attention at time of routine followup examinations is recommended to ensure the stability of these findings. 5. Additional incidental findings, as above   Normal 2-D echocardiogram recently  @ASSESSMENTPLANBEGIN @ Assessment: Encounter Diagnoses  Name Primary?  . Alcoholic cirrhosis of liver with ascites (HCC) Yes  . Liver lesion   . Elevated LFTs   . Coagulopathy (HCC)   . Thrombocytopenia (HCC)   . Portal hypertension with esophageal varices (HCC)   . Hypotension, unspecified hypotension type     The large volume ascites remains in the most difficult challenge.  He has a meld score that is borderline for referral of liver transplant evaluation.  I do not think he would currently be listed, and do not feel he needs a transplant clinic evaluation unless interventional radiology feels strongly about it.  I am referring him to interventional radiology clinic to discuss a TIPS placement.  I again described the procedure in some detail including the potential risk of hepatic encephalopathy or decompensation of liver  disease.  I feel that he is a good candidate for it.  There is no clot in the portal venous system, he has a normal cardiac function and no liver cancer.   I have also asked him to discontinue propranolol in hopes that his blood pressure will come up some, giving us more room to use additional diuretic.  7 to 8 L paracentesis early next week with 50 g of IV albumin given at that time.  2 days after doing that, start amiloride 5 mg once daily in addition to the current dose of my 40 mg once daily.  I had suggested spironolactone, but he seemed to recall getting gynecomastia from that in the past.  Further plans to follow IR evaluation.  Total time 45 minutes, over half spent face-to-face with patient in counseling and coordination of care.  Extensive questions from his brother were answered in detail.  Charlie PitterHenry L Danis III

## 2018-05-10 ENCOUNTER — Encounter (HOSPITAL_COMMUNITY): Payer: Self-pay | Admitting: Diagnostic Radiology

## 2018-05-10 ENCOUNTER — Ambulatory Visit (HOSPITAL_COMMUNITY)
Admission: RE | Admit: 2018-05-10 | Discharge: 2018-05-10 | Disposition: A | Discharge status: Home / Self Care | Payer: 59 | Source: Ambulatory Visit | Attending: Gastroenterology | Admitting: Gastroenterology

## 2018-05-10 ENCOUNTER — Telehealth: Payer: Self-pay | Admitting: Gastroenterology

## 2018-05-10 DIAGNOSIS — K7031 Alcoholic cirrhosis of liver with ascites: Secondary | ICD-10-CM | POA: Insufficient documentation

## 2018-05-10 HISTORY — PX: IR PARACENTESIS: IMG2679

## 2018-05-10 MED ORDER — ALBUMIN HUMAN 25 % IV SOLN
INTRAVENOUS | Status: AC
  Administered 2018-05-10: 50 g
  Filled 2018-05-10: qty 200

## 2018-05-10 MED ORDER — LIDOCAINE HCL (PF) 2 % IJ SOLN
INTRAMUSCULAR | Status: DC | PRN
  Administered 2018-05-10: 10 mL

## 2018-05-10 MED ORDER — LIDOCAINE HCL (PF) 2 % IJ SOLN
INTRAMUSCULAR | Status: AC
  Filled 2018-05-10: qty 20

## 2018-05-10 NOTE — Telephone Encounter (Signed)
The pt had several questions regarding the GI appt for TIPS.  I advised her to call GI for answers to her questions, they will be better able to answer questions regarding TIPS

## 2018-05-10 NOTE — Procedures (Signed)
PROCEDURE SUMMARY:  Successful image-guided paracentesis from the right abdomen.  Yielded 8 liters of golden yellow fluid.  No immediate complications.  Patient tolerated well.   Specimen was not sent for labs.  Villa HerbShannon A Madasyn Heath PA-C 05/10/2018 10:48 AM

## 2018-05-13 ENCOUNTER — Ambulatory Visit
Admission: RE | Admit: 2018-05-13 | Discharge: 2018-05-13 | Disposition: A | Discharge status: Home / Self Care | Payer: 59 | Source: Ambulatory Visit | Attending: Gastroenterology | Admitting: Gastroenterology

## 2018-05-13 ENCOUNTER — Telehealth: Payer: Self-pay | Admitting: Family Medicine

## 2018-05-13 ENCOUNTER — Encounter: Payer: Self-pay | Admitting: Radiology

## 2018-05-13 DIAGNOSIS — K7031 Alcoholic cirrhosis of liver with ascites: Secondary | ICD-10-CM

## 2018-05-13 HISTORY — PX: IR RADIOLOGIST EVAL & MGMT: IMG5224

## 2018-05-13 MED FILL — LACTULOSE 10 GM/15 ML SOLN: 10 | 16 days supply | Qty: 240 | Fill #1

## 2018-05-13 NOTE — Telephone Encounter (Signed)
Patient's contact Kenneth [name] called to request an update on their transportation application  Please follow up [phone]

## 2018-05-13 NOTE — Consult Note (Signed)
Chief Complaint: Patient was seen in consultation today for  Chief Complaint  Patient presents with  . Consult    Consult for TIPS   at the request of Danis,Henry L III  Referring Physician(s): Danis,Henry L III  History of Present Illness: Kenneth KLOOS is a 57 y.o. male with past history significant for anxiety, polysubstance abuse and alcoholic cirrhosis (sober since 12/2015) who presents today to the interventional radiology clinic for evaluation of potential TIPS creation secondary to recurrent symptomatic ascites requiring paracentesis of increasing frequency and volumes.  The patient most recently underwent ultrasound-guided paracentesis on 7/15 yielding 8 L of peritoneal fluid and previously underwent ultrasound-guided paracentesis on 7/2 yielding 5 L of peritoneal fluid.  Patient is accompanied by his sister though serves as his own historian.  Patient denies hematemesis, bloody or melanotic stools.  He denies yellowing of the skin or eyes.  Patient states that since May of this year (about the time he began requiring large volume paracentesis), he has had difficulty with cloudy thinking and short-term memory.  Note, the patient has been prescribed lactulose however was confused as far as the dosing regimen and only took the medicine for 1 week.  Patient denies fever or chills.  No chest pain or shortness of breath.  He is interested in pursuing all available treatment options.   Past Medical History:  Diagnosis Date  . Alcohol abuse   . Anxiety   . Blood clot in vein   . Cirrhosis (HCC)   . Depression   . Esophageal varices (HCC) 09/02/2014  . H/O [facility] spotted fever    age 4 or 1  . Hiatal hernia 09/02/2014  . Polysubstance abuse (HCC)   . Portal hypertension with esophageal varices (HCC) 09/02/2014  . Portal hypertensive gastropathy (HCC) 09/02/2014  . Thrombocytopenia (HCC) 09/02/2014  . Upper GI bleed 08/31/2014    Past Surgical History:    Procedure Laterality Date  . ESOPHAGOGASTRODUODENOSCOPY Left 09/01/2014   Procedure: ESOPHAGOGASTRODUODENOSCOPY (EGD);  Surgeon: Charna Elizabeth, MD;  Location: [facility];  Service: Endoscopy;  Laterality: Left;  . EYE SURGERY Left    age 8  . IR PARACENTESIS  03/16/2018  . IR PARACENTESIS  03/29/2018  . IR PARACENTESIS  04/19/2018  . IR PARACENTESIS  04/27/2018  . IR PARACENTESIS  05/10/2018  . IR RADIOLOGIST EVAL & MGMT  05/13/2018    Allergies: Latex  Medications: Prior to Admission medications   Medication Sig Start Date End Date Taking? Authorizing Provider  aMILoride (MIDAMOR) 5 MG tablet Take 1 tablet (5 mg total) by mouth daily. 05/05/18  Yes Danis, Starr Lake III, MD  diclofenac sodium (VOLTAREN) 1 % GEL Apply 4 g topically 4 (four) times daily. 12/15/17  Yes [name], MD  FLUoxetine (PROZAC) 40 MG capsule Take 1 capsule (40 mg total) by mouth daily. 04/28/18  Yes [name], MD  furosemide (LASIX) 40 MG tablet Take 1 tablet (40 mg total) by mouth daily. 12/15/17  Yes [name], MD  Multiple Vitamin (MULTIVITAMIN WITH MINERALS) TABS tablet Take 0.5 tablets by mouth as needed.    Yes [provider]  ondansetron (ZOFRAN) 4 MG tablet TAKE 1 TABLET BY MOUTH 2 TIMES DAILY AS NEEDED FOR NAUSEA OR VOMITING. 04/19/18  Yes Marcine Matar, MD  lactulose (CHRONULAC) 10 GM/15ML solution Take 15 mLs (10 g total) by mouth daily. Patient not taking: Reported on 05/13/2018 04/28/18   [name], MD     Family History  Problem  Relation Age of Onset  . Leukemia Father        acute myloid   . Hyperlipidemia Father   . Hypertension Father   . Colon cancer Neg Hx   . Esophageal cancer Neg Hx   . Pancreatic cancer Neg Hx   . Stomach cancer Neg Hx   . Liver disease Neg Hx     Social History   Socioeconomic History  . Marital status: Single    Spouse name: Not on file  . Number of children: 1  . Years of education: Not on file  . Highest education level: Not on file   Occupational History  . Occupation: Administrator  Social Needs  . Financial resource strain: Not on file  . Food insecurity:    Worry: Not on file    Inability: Not on file  . Transportation needs:    Medical: Not on file    Non-medical: Not on file  Tobacco Use  . Smoking status: Current Every Day Smoker    Packs/day: 0.50    Years: 2.00    Pack years: 1.00    Types: Cigarettes  . Smokeless tobacco: Never Used  Substance and Sexual Activity  . Alcohol use: No    Comment: no alcohol since 01/23/16  . Drug use: Yes    Types: Marijuana    Comment: 2 months  . Sexual activity: Not on file  Lifestyle  . Physical activity:    Days per week: Not on file    Minutes per session: Not on file  . Stress: Not on file  Relationships  . Social connections:    Talks on phone: Not on file    Gets together: Not on file    Attends religious service: Not on file    Active member of club or organization: Not on file    Attends meetings of clubs or organizations: Not on file    Relationship status: Not on file  Other Topics Concern  . Not on file  Social History Narrative  . Not on file    ECOG Status: 1 - Symptomatic but completely ambulatory  Review of Systems: A 12 point ROS discussed and pertinent positives are indicated in the HPI above.  All other systems are negative.  Review of Systems  Constitutional: Positive for activity change, appetite change and fatigue. Negative for chills and fever.  Respiratory: Negative.   Cardiovascular: Negative.  Negative for leg swelling.  Gastrointestinal: Positive for abdominal distention and nausea. Negative for blood in stool.  Genitourinary: Negative.   Skin: Negative for color change.  Psychiatric/Behavioral: Positive for confusion.    Vital Signs: BP 99/74   Pulse 64   Temp 98.1 F (36.7 C) (Oral)   Resp 14   Ht 5\' 7"  (1.702 m)   Wt 140 lb 11.2 oz (63.8 kg)   SpO2 98%   BMI 22.04 kg/m   Physical Exam  Constitutional: No  distress.  Appears somewhat cachectic.  Eyes: Conjunctivae are normal.  Cardiovascular: Normal rate and regular rhythm.  Pulmonary/Chest: Effort normal and breath sounds normal.  Abdominal: He exhibits distension. There is no tenderness.  Palpable fluid wave.  Skin: Skin is warm and dry.  No jaundice.  Psychiatric: His behavior is normal.  Somewhat somber.  Nursing note and vitals reviewed.   Imaging: Ct Abdomen Pelvis W Contrast  Result Date: 04/26/2018 CLINICAL DATA:  57 year old male with history of cirrhosis. Ascites. Distended abdomen. EXAM: CT ABDOMEN AND PELVIS WITH CONTRAST TECHNIQUE: Multidetector CT  imaging of the abdomen and pelvis was performed using the standard protocol following bolus administration of intravenous contrast. CONTRAST:  ISOVUE-300 IOPAMIDOL (ISOVUE-300) INJECTION 61% COMPARISON:  CT the abdomen and pelvis 01/24/2016. FINDINGS: Lower chest: Extensive volume loss and architectural distortion in the lower lungs bilaterally, likely to reflect a combination of chronic scarring and/or atelectasis. Hepatobiliary: Liver has a very shrunken appearance and nodular contour, indicative of underlying cirrhosis. In the inferior aspect of the left lobe of the liver, there appears to be a 2.8 x 3.2 cm lesion which is slightly hypervascular (axial image 34 of series 2), although this may simply reflect heterogeneous perfusion, as much of the liver demonstrates relatively heterogeneous perfusion. Arterial phase images were not obtained. Focal atrophy of segments 4A, 4B, 5 and 8. No intra or extrahepatic biliary ductal dilatation. Multiple calcified gallstones lying dependently in the gallbladder. Gallbladder does not appear distended. Pancreas: No pancreatic mass. No pancreatic ductal dilatation. No pancreatic or peripancreatic fluid or inflammatory changes. Spleen: Spleen is enlarged measuring 16.9 x 6.6 x 14.3 cm (estimated splenic volume of 798 mL). Adrenals/Urinary Tract:  Multiple subcentimeter low-attenuation lesions in both kidneys are too small to definitively characterize, but are statistically likely to represent tiny cysts. No hydroureteronephrosis. Urinary bladder is normal in appearance. Bilateral adrenal glands are normal in appearance. Stomach/Bowel: Normal appearance of the stomach. No pathologic dilatation of small bowel or colon. Normal appendix. Vascular/Lymphatic: Aortic atherosclerosis, without evidence of aneurysm or dissection in the abdominal or pelvic vasculature. Portal vein is mildly dilated measuring 16 mm in the porta hepatis. Multiple prominent borderline enlarged retroperitoneal lymph nodes are noted, nonspecific. Mildly enlarged aortocaval lymph node measuring 11 mm in short axis (axial image 41 of series 2). Mildly enlarged celiac axis lymph node (axial image 27 of series 2) measuring 11 mm in short axis. Reproductive: Prostate gland and seminal vesicles are unremarkable in appearance. Other: Large volume of ascites. No pneumoperitoneum. Umbilical hernia containing ascites. Bilateral inguinal hernias with ascites extending into the scrotum bilaterally (right greater than left). Musculoskeletal: There are no aggressive appearing lytic or blastic lesions noted in the visualized portions of the skeleton. IMPRESSION: 1. Cirrhosis with stigmata of portal hypertension, including dilated portal vein and splenomegaly, as detailed above. There is a potential hypervascular area in the inferior aspect of the left lobe of the liver which is concerning for potential neoplasm such as hepatocellular carcinoma. However, this may simply reflect a benign perfusion anomaly. This is poorly evaluated on today's examination, and follow-up with MRI of the abdomen with and without IV gadolinium is strongly recommended in the near future to better evaluate this finding. Correlation with AFP level is also recommended. 2. Large volume of ascites. 3. Aortic atherosclerosis. 4.  Umbilical hernia and bilateral inguinal hernias (right greater than left), containing ascites. 5. Additional incidental findings, as above. Electronically Signed   By: Trudie Reed M.D.   On: 04/26/2018 11:05   Mr Liver W Wo Contrast  Result Date: 04/30/2018 CLINICAL DATA:  57 year old male with history of alcoholic cirrhosis. Hypervascular lesion noted in the liver on prior CT the abdomen and pelvis. Followup evaluation. EXAM: MRI ABDOMEN WITHOUT AND WITH CONTRAST TECHNIQUE: Multiplanar multisequence MR imaging of the abdomen was performed both before and after the administration of intravenous contrast. CONTRAST:  7mL EOVIST GADOXETATE DISODIUM 0.25 MOL/L IV SOLN COMPARISON:  CT the abdomen and pelvis 04/26/2018. No prior abdominal MRI. FINDINGS: Lower chest: Linear areas of signal intensity in the lower lobes of the lungs bilaterally  correspond to areas of atelectasis and/or scarring noted on recent CT examination. Hepatobiliary: Liver has a very shrunken appearance and nodular contour, with severe atrophy of central portions of the liver including segments 4A, 4B, 5 and 8. The area of concern in the inferior aspect of the left lobe of the liver on the prior CT examination does not demonstrate definitive hypervascular enhancement on today's study. The liver is very heterogeneous in signal intensity with multiple nodular appearing areas that are slightly T1 hyperintense on pre gadolinium images, reflective of multiple regenerative nodules. The areas of greatest focal atrophy demonstrate internal T2 hyperintensity, compatible with areas of confluent hepatic fibrosis. No definite suspicious hepatic lesions. No intra or extrahepatic biliary ductal dilatation. Several small filling defects are noted lying dependently in the gallbladder, compatible with gallstones. Gallbladder is not distended. Pancreas:  No pancreatic mass.  No pancreatic ductal dilatation. Spleen: Spleen is enlarged measuring 16.6 x 6.2 x 13.9  cm (estimated splenic volume of 715 mL). Adrenals/Urinary Tract: Several tiny subcentimeter T1 hypointense, T2 hyperintense, nonenhancing renal lesions are noted, compatible with tiny simple cysts. No hydroureteronephrosis in the visualized portions of the abdomen. Bilateral adrenal glands are normal in appearance. Stomach/Bowel: Unremarkable. Vascular/Lymphatic: No aneurysm identified in the visualized abdominal vasculature. There multiple prominent borderline enlarged and mildly enlarged retroperitoneal and upper abdominal lymph nodes, largest of which is in the aortocaval nodal station measuring 12 mm in short axis (axial image 80 of series 503), and in the celiac axis nodal station measuring up to 12 mm in short axis (axial image 54 of series 503). Other:  Large amount of ascites. Musculoskeletal: No aggressive appearing osseous lesions are noted in the visualized portions of the skeleton. IMPRESSION: 1. Advanced cirrhotic changes in the liver, with areas of confluent hepatic fibrosis, as above. The area of concern in the inferior aspect of the left lobe of the liver does not demonstrate definitive hypervascular enhancement or other imaging findings to suggest hepatocellular carcinoma at this time. 2. Splenomegaly. 3. Large volume of ascites. 4. Nonspecific upper abdominal and retroperitoneal lymphadenopathy similar to the prior study, as above. Attention at time of routine followup examinations is recommended to ensure the stability of these findings. 5. Additional incidental findings, as above. Electronically Signed   By: Trudie Reed M.D.   On: 04/30/2018 10:12   Ir Radiologist Eval & Mgmt  Result Date: 05/13/2018 Please refer to notes tab for details about interventional procedure. (Op Note)  Ir Paracentesis  Result Date: 05/10/2018 INDICATION: Ascites secondary to alcoholic cirrhosis. Request for therapeutic paracentesis. EXAM: ULTRASOUND GUIDED THERAPEUTIC PARACENTESIS MEDICATIONS: 10 mL 2%  lidocaine COMPLICATIONS: None immediate. PROCEDURE: Informed written consent was obtained from the patient after a discussion of the risks, benefits and alternatives to treatment. A timeout was performed prior to the initiation of the procedure. Initial ultrasound scanning demonstrates a large amount of ascites within the right lower abdominal quadrant. The right lower abdomen was prepped and draped in the usual sterile fashion. 2% lidocaine was used for local anesthesia. Following this, a 19 gauge, 7-cm, Yueh catheter was introduced. An ultrasound image was saved for documentation purposes. The paracentesis was performed. The catheter was removed and a dressing was applied. The patient tolerated the procedure well without immediate post procedural complication. FINDINGS: A total of approximately 8L of golden yellow fluid was removed. Samples were not sent to the laboratory. IMPRESSION: Successful ultrasound-guided paracentesis yielding 8 liters of peritoneal fluid. Read by Lynnette Caffey, PA-C Electronically Signed   By: Madelaine Bhat  Lowella DandyHenn M.D.   On: 05/10/2018 12:17   Ir Paracentesis  Result Date: 04/27/2018 CLINICAL DATA:  Cirrhosis. Recurrent abdominal ascites.Abdominal distension. EXAM: ULTRASOUND GUIDED PARACENTESIS TECHNIQUE: The procedure, risks (including but not limited to bleeding, infection, organ damage ), benefits, and alternatives were explained to the patient. Questions regarding the procedure were encouraged and answered. The patient understands and consents to the procedure. Survey ultrasound of the abdomen was performed and an appropriate skin entry site in the right lower abdomen was selected. Skin site was marked, prepped with Betadine, and draped in usual sterile fashion, and infiltrated locally with 1% lidocaine. A Safe-T-Centesis needle was advanced into the peritoneal space until fluid could be aspirated. The sheath was advanced and the needle removed. 5 L of yellowascites were aspirated.  Small amount of residual ascites noted. COMPLICATIONS: COMPLICATIONS none IMPRESSION: Technically successful ultrasound guided paracentesis, removing 5 L of ascites. Electronically Signed   By: Corlis Leak  Hassell M.D.   On: 04/27/2018 15:03   Ir Paracentesis  Result Date: 04/19/2018 INDICATION: Patient with history alcoholic cirrhosis with recurrent ascites. Request is made for therapeutic paracentesis. Patient received albumin with procedure today. EXAM: ULTRASOUND GUIDED THERAPEUTIC PARACENTESIS MEDICATIONS: 10 mL of 2% lidocaine COMPLICATIONS: None immediate. PROCEDURE: Informed written consent was obtained from the patient after a discussion of the risks, benefits and alternatives to treatment. A timeout was performed prior to the initiation of the procedure. Initial ultrasound scanning demonstrates a large amount of ascites within the right lower abdominal quadrant. The right lower abdomen was prepped and draped in the usual sterile fashion. 2% lidocaine was used for local anesthesia. Following this, a 19 gauge, 7-cm, Yueh catheter was introduced. An ultrasound image was saved for documentation purposes. The paracentesis was performed. The catheter was removed and a dressing was applied. The patient tolerated the procedure well without immediate post procedural complication. FINDINGS: A total of approximately 9.8 L of clear gold fluid was removed. IMPRESSION: Successful ultrasound-guided paracentesis yielding 9.8 L of peritoneal fluid. Read by: Elwin MochaAlexandra Louk, PA-C Electronically Signed   By: Gilmer MorJaime  Wagner D.O.   On: 04/19/2018 12:09    Labs:  CBC: Recent Labs    02/25/18 1137 02/26/18 1605 03/15/18 1421 04/16/18 1558  WBC 4.7 6.0 4.1 5.7  HGB 12.9* 13.0 12.5* 15.1  HCT 38.5 38.3* 38.8 44.4  PLT 86* 85* 85* 91.0*    COAGS: Recent Labs    02/26/18 1605 03/15/18 1421 03/26/18 1346 04/16/18 1558  INR 1.42 1.4* 1.4* 1.6*  APTT 37*  --  35*  --     BMP: Recent Labs    02/25/18 1137  02/26/18 1605 03/15/18 1421 04/16/18 1558 04/28/18 1042  NA 141 139 139 132* 138  K 3.8 3.6 3.9 3.6 4.2  CL 104 105 102 99 101  CO2 25 25 24 29 25   GLUCOSE 48* 87 84 97 78  BUN 4* 6 6 7 9   CALCIUM 8.5* 8.4* 8.2* 8.7 8.6*  CREATININE 0.74* 0.68 0.73* 0.69 0.67*  GFRNONAA 103 >60 104  --  107  GFRAA 119 >60 120  --  123    LIVER FUNCTION TESTS: Recent Labs    02/26/18 1605 03/15/18 1421 04/16/18 1558 04/28/18 1042  BILITOT 2.0* 1.0 2.3* 2.0*  AST 59* 60* 63* 80*  ALT 35 33 33 36  ALKPHOS 124 141* 157* 165*  PROT 7.1 6.2 7.5 6.6  ALBUMIN 3.2* 3.1* 3.2* 3.2*    TUMOR MARKERS: Recent Labs    04/16/18 1558  AFPTM 2.0  Assessment and Plan:  Kenneth [name] is a 57 y.o. male with past history significant for anxiety, polysubstance abuse and alcoholic cirrhosis (sober since 12/2015) who presents today to the interventional radiology clinic for evaluation of potential TIPS creation secondary to recurrent symptomatic ascites requiring paracentesis of increasing frequency and volumes.  The patient most recently underwent ultrasound-guided paracentesis on 7/15 yielding 8 L of peritoneal fluid and previously underwent ultrasound-guided paracentesis on 7/2 yielding 5 L of peritoneal fluid.  Patient denies hematemesis, bloody or melanotic stools, yellowing of the skin or eyes however does state that since May of this year (about the time he began requiring large volume paracentesis), he has had difficulty with cloudy thinking and short-term memory.  Note, the patient has been prescribed lactulose however was confused as far as the dosing regimen and only took the medicine for 1 week.    Dr. Myrtie Neither has done an excellent job preparing the patient for potential TIPS candidacy and the patient and his sister present to today's appointment well informed about the procedure.   Abdominal MRI performed 7/5 and abdominal CT 04/26/2018 were negative for the presence of hepatocellular carcinoma and  demonstrated patency of the hepatic and portal veins.  Additionally, AFP level obtained on 04/28/2018 was 2.0.  Cardiac echo performed on 04/27/2018 demonstrates adequate cardiac function to allow for TIPS creation.  Calculated Na-MELD score with most recent laboratories is 16 which places the patient in an acceptable range for TIPS creation.  Details of TIPS creation were discussed in detail with the patient and the patient's sister.  I explained that the TIPS would be created in hopes of improving/resolving his recurrent ascites, however his underlying liver dysfunction will persist.    I explained that TIPS creation can be associated with worsening mental status, however I am hopeful this can be better managed once he starts taking his lactulose as prescribed.  Note, ammonia level from 04/28/2018 was within the normal range of 79.    Prolonged conversations were held with the patient and the patient's sister regarding the risks and benefits of TIPS creation including but not limited to infection, bleeding, damage to adjacent structures, worsening hepatic and/or cardiac function, worsening mental status and death.  I explained to the patient and the patient's sister that we could wait several weeks to see if his mental status improves after taking lactulose as prescribed however the patient and the patient's sister feel that his mental status issues are likely multifactorial and likely in part due to worsening depression with his deteriorating health as well as stress associated with helping to care for his elderly and sickly mother.  I also relayed my concern that the more large volume paracentesis that he undergoes, the higher the likelihood of a worsening Na-MELD score.    After a prolonged and detailed conversation, the patient and the patient's sister both wish to proceed with TIPS creation  As such, this procedure will be scheduled with general anesthesia at [facility].  The procedure will  entail an overnight admission for continued observation.  Preprocedural laboratories (CMP, CBC and INR) will be obtained on the day of the TIPS creation however as above, patient's current Na-MELD score is within acceptable range.  While the patient would prefer that I performed the procedure, he is willing to have one of my capable interventional radiology partners perform the procedure if this would entail the procedure scheduled in a more expeditious manner.  Additionally, given the complexity of TIPS creation, especially in  the setting of his markedly cirrhotic liver, we will attempt to schedule another interventional radiologist to assist with the procedure, schedule allowing.  The patient was instructed to call the interventional radiology clinic with any interval questions or concerns.  Thank you for this interesting consult.  I greatly enjoyed meeting STACI CARVER and look forward to participating in their care.  A copy of this report was sent to the requesting provider on this date.  Electronically Signed: Simonne Come 05/13/2018, 10:00 AM   I spent a total of 30 Minutes in face to face in clinical consultation, greater than 50% of which was counseling/coordinating care for TIPS creation.

## 2018-05-14 ENCOUNTER — Telehealth: Payer: Self-pay | Admitting: Family Medicine

## 2018-05-14 NOTE — Telephone Encounter (Signed)
Kenneth [name]'s phone number update is [phone]

## 2018-05-17 ENCOUNTER — Other Ambulatory Visit (HOSPITAL_COMMUNITY): Payer: Self-pay | Admitting: Interventional Radiology

## 2018-05-17 ENCOUNTER — Other Ambulatory Visit: Payer: Self-pay | Admitting: Family Medicine

## 2018-05-17 DIAGNOSIS — K7031 Alcoholic cirrhosis of liver with ascites: Secondary | ICD-10-CM

## 2018-05-17 MED FILL — FLUoxetine HCL 40 MG CAPS: 40 | 30 days supply | Qty: 30 | Fill #2

## 2018-05-17 MED FILL — FUROSEMIDE 40 MG TAB: 40 | 30 days supply | Qty: 30 | Fill #0

## 2018-05-17 NOTE — Telephone Encounter (Signed)
Call placed to the patient's sister, Santina EvansCatherine,  at the number noted. It is not the number for [facility]. Santina EvansCatherine was with the patient when this CM assisted with completion of the SCAT application.  Santina EvansCatherine stated that she spoke to [place] with SCAT eligibility who stated that she did not receive the application that was faxed 04/30/18.  This CM refaxed the application to [employer] eligibility and instructed Santina EvansCatherine to contact this CM if this re-faxed application is not received when she checks with Toni Amendourtney.

## 2018-05-18 ENCOUNTER — Encounter (HOSPITAL_COMMUNITY): Payer: Self-pay | Admitting: *Deleted

## 2018-05-18 ENCOUNTER — Other Ambulatory Visit: Payer: Self-pay

## 2018-05-18 ENCOUNTER — Other Ambulatory Visit: Payer: Self-pay | Admitting: Radiology

## 2018-05-18 ENCOUNTER — Other Ambulatory Visit: Payer: Self-pay | Admitting: Student

## 2018-05-18 MED ORDER — ALBUMIN HUMAN 25 % IV SOLN
25.0000 g | Freq: Once | INTRAVENOUS | Status: DC
  Filled 2018-05-18: qty 100

## 2018-05-18 MED ORDER — ALBUMIN HUMAN 25 % IV SOLN
50.0000 g | INTRAVENOUS | Status: DC
  Filled 2018-05-18: qty 200

## 2018-05-18 NOTE — Progress Notes (Addendum)
Anesthesia Chart Review: Kenneth [name]  Case:  [id number] Date/Time:  05/19/18 0800   Procedure:  TIPS (N/A )   Anesthesia type:  General   Pre-op diagnosis:  ALCHOLIC SCHEROSIS  OF LIVER   Location:  [facility] ROOM 10 / [facility]   Surgeon:  Simonne Come, MD      DISCUSSION: Patient is a 57 year old male scheduled for the above procedure. 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 He had recent echo showed normal LVF.   He is for labs on arrival. T&S order placed. If Prepare PRBC needed per IR or anesthesiologist then practitioner attestation of consent will need to be completed as part of the order set.   If labs stable and otherwise no acute changes then I would anticipate that he can proceed as planned. Prior to procedure anesthesiologist can confirm with Dr. Grace Isaac which IJ he plans to access for the procedure.   VS: To be done on day of procedure.  PROVIDERS: [name], MD is PCP Myrtie Neither Andreas Blower, MD is GI   LABS: He will get updated labs prior to procedure. Historically LFTs have been mildly elevated and PLT count ~ 65-90K. H/H earlier this month was WNL. Discussed with several anesthesiologists and patient will need at least T&S for this procedure with consideration of T&C (Prepare PRBC).    IMAGES: MRI liver 04/30/18: IMPRESSION: 1. Advanced cirrhotic changes in the liver, with areas of confluent hepatic fibrosis. The area of concern in the inferior aspect of the left lobe of the liver does not demonstrate definitive hypervascular enhancement or other imaging findings to suggest hepatocellular carcinoma at this time. 2. Splenomegaly. 3.  Large volume of ascites. 4. Nonspecific upper abdominal and retroperitoneal lymphadenopathy similar to the prior study, as above. Attention at time of routine followup examinations is recommended to ensure the stability of these findings. 5. Additional incidental findings, as above. (See full report under Results Review)   EKG: EKG 12/15/17: SB at 53 bpm.    CV: Echo 04/27/18 (done as part of w/u for TIPS): Study Conclusions - Left ventricle: The cavity size was normal. Wall thickness was   normal. Systolic function was normal. The estimated ejection   fraction was in the range of 60% to 65%. Wall motion was normal;   there were no regional wall motion abnormalities. Left   ventricular diastolic function parameters were normal for the   patient&'s age. - Pulmonary arteries: PA peak pressure: 31 mm Hg (S).   Past Medical History:  Diagnosis Date  . Alcohol abuse   . Anxiety   . Arthritis    "never diagnosis"  . Blood clot in vein   . Cirrhosis (HCC)   . Depression   . Dyspnea    Due to ascites  . Esophageal varices (HCC) 09/02/2014  . H/O [facility] spotted fever    age 25 or 38  . Hernia, inguinal    right  . Hiatal hernia 09/02/2014  . Polysubstance abuse (HCC)   . Portal hypertension with esophageal varices (HCC) 09/02/2014  . Portal hypertensive gastropathy (HCC) 09/02/2014  . Thrombocytopenia (HCC) 09/02/2014  . Upper GI bleed 08/31/2014    Past Surgical History:  Procedure Laterality Date  . ESOPHAGOGASTRODUODENOSCOPY Left 09/01/2014  Procedure: ESOPHAGOGASTRODUODENOSCOPY (EGD);  Surgeon: Charna ElizabethJyothi Mann, MD;  Location: [facility];  Service: Endoscopy;  Laterality: Left;  . EYE SURGERY Left    age 89  . IR PARACENTESIS  03/16/2018  . IR PARACENTESIS  03/29/2018  . IR PARACENTESIS  04/19/2018  . IR PARACENTESIS  04/27/2018  . IR PARACENTESIS  05/10/2018  . IR RADIOLOGIST EVAL & MGMT  05/13/2018    MEDICATIONS: . albumin human 25 % solution 25 g  . albumin human 25  % solution 25 g  . albumin human 25 % solution 50 g   . aMILoride (MIDAMOR) 5 MG tablet  . diclofenac sodium (VOLTAREN) 1 % GEL  . FLUoxetine (PROZAC) 40 MG capsule  . lactulose (CHRONULAC) 10 GM/15ML solution  . Multiple Vitamin (MULTIVITAMIN WITH MINERALS) TABS tablet  . ondansetron (ZOFRAN) 4 MG tablet  . furosemide (LASIX) 40 MG tablet    Velna Ochsllison Kayman Snuffer, PA-C [facility]Anesthesiology Phone [phone] 05/18/2018 2:14 PM

## 2018-05-18 NOTE — Anesthesia Preprocedure Evaluation (Addendum)
Anesthesia Evaluation  Patient identified by MRN, date of birth, ID band Patient awake    Reviewed: Allergy & Precautions, NPO status , Patient's Chart, lab work & pertinent test results  History of Anesthesia Complications Negative for: history of anesthetic complications  Airway Mallampati: II  TM Distance: >3 FB Neck ROM: Full    Dental  (+) Dental Advisory Given   Pulmonary Current Smoker,    breath sounds clear to auscultation       Cardiovascular negative cardio ROS   Rhythm:Regular Rate:Normal   '19 TTE - EF 60-65%. PASP 31 mmHg    Neuro/Psych Anxiety Depression negative neurological ROS  negative psych ROS   GI/Hepatic hiatal hernia, (+) Cirrhosis   Esophageal Varices and ascites  substance abuse  alcohol use and marijuana use,   Endo/Other  negative endocrine ROS  Renal/GU negative Renal ROS     Musculoskeletal  (+) Arthritis ,   Abdominal   Peds  Hematology negative hematology ROS (+) anemia ,   Anesthesia Other Findings   Reproductive/Obstetrics                           Anesthesia Physical Anesthesia Plan  ASA: IV  Anesthesia Plan: General   Post-op Pain Management:    Induction: Intravenous  PONV Risk Score and Plan: 2 and Treatment may vary due to age or medical condition, Ondansetron and Dexamethasone  Airway Management Planned: Oral ETT  Additional Equipment: Arterial line  Intra-op Plan:   Post-operative Plan: Extubation in OR  Informed Consent: I have reviewed the patients History and Physical, chart, labs and discussed the procedure including the risks, benefits and alternatives for the proposed anesthesia with the patient or authorized representative who has indicated his/her understanding and acceptance.   Dental advisory given  Plan Discussed with: CRNA and Anesthesiologist  Anesthesia Plan Comments:      Anesthesia Quick Evaluation

## 2018-05-19 ENCOUNTER — Inpatient Hospital Stay (HOSPITAL_COMMUNITY): Payer: 59 | Admitting: Vascular Surgery

## 2018-05-19 ENCOUNTER — Inpatient Hospital Stay (HOSPITAL_COMMUNITY): Payer: 59

## 2018-05-19 ENCOUNTER — Encounter (HOSPITAL_COMMUNITY): Payer: Self-pay | Admitting: Surgery

## 2018-05-19 ENCOUNTER — Encounter (HOSPITAL_COMMUNITY)
Admission: RE | Disposition: A | Discharge status: Home / Self Care | Payer: Self-pay | Source: Ambulatory Visit | Attending: Interventional Radiology

## 2018-05-19 ENCOUNTER — Observation Stay (HOSPITAL_COMMUNITY)
Admission: RE | Admit: 2018-05-19 | Discharge: 2018-05-20 | Disposition: A | Discharge status: Home / Self Care | Payer: 59 | Source: Ambulatory Visit | Attending: Interventional Radiology | Admitting: Interventional Radiology

## 2018-05-19 DIAGNOSIS — K7031 Alcoholic cirrhosis of liver with ascites: Principal | ICD-10-CM | POA: Insufficient documentation

## 2018-05-19 DIAGNOSIS — D696 Thrombocytopenia, unspecified: Secondary | ICD-10-CM

## 2018-05-19 DIAGNOSIS — K766 Portal hypertension: Secondary | ICD-10-CM | POA: Insufficient documentation

## 2018-05-19 DIAGNOSIS — F1290 Cannabis use, unspecified, uncomplicated: Secondary | ICD-10-CM | POA: Diagnosis not present

## 2018-05-19 DIAGNOSIS — F329 Major depressive disorder, single episode, unspecified: Secondary | ICD-10-CM | POA: Diagnosis not present

## 2018-05-19 DIAGNOSIS — D689 Coagulation defect, unspecified: Secondary | ICD-10-CM

## 2018-05-19 DIAGNOSIS — M1990 Unspecified osteoarthritis, unspecified site: Secondary | ICD-10-CM | POA: Diagnosis not present

## 2018-05-19 DIAGNOSIS — F17210 Nicotine dependence, cigarettes, uncomplicated: Secondary | ICD-10-CM | POA: Diagnosis not present

## 2018-05-19 DIAGNOSIS — Z86718 Personal history of other venous thrombosis and embolism: Secondary | ICD-10-CM | POA: Diagnosis not present

## 2018-05-19 DIAGNOSIS — I8510 Secondary esophageal varices without bleeding: Secondary | ICD-10-CM | POA: Insufficient documentation

## 2018-05-19 DIAGNOSIS — K3189 Other diseases of stomach and duodenum: Secondary | ICD-10-CM | POA: Diagnosis not present

## 2018-05-19 DIAGNOSIS — F419 Anxiety disorder, unspecified: Secondary | ICD-10-CM | POA: Insufficient documentation

## 2018-05-19 HISTORY — PX: IR TIPS: IMG2295

## 2018-05-19 HISTORY — DX: Unspecified osteoarthritis, unspecified site: M19.90

## 2018-05-19 HISTORY — DX: Unilateral inguinal hernia, without obstruction or gangrene, not specified as recurrent: K40.90

## 2018-05-19 HISTORY — PX: RADIOLOGY WITH ANESTHESIA: SHX6223

## 2018-05-19 HISTORY — DX: Dyspnea, unspecified: R06.00

## 2018-05-19 HISTORY — PX: IR PARACENTESIS: IMG2679

## 2018-05-19 LAB — CBC
HEMATOCRIT: 42.4 % (ref 39.0–52.0)
HEMOGLOBIN: 14.7 g/dL (ref 13.0–17.0)
MCH: 31.1 pg (ref 26.0–34.0)
MCHC: 34.7 g/dL (ref 30.0–36.0)
MCV: 89.8 fL (ref 78.0–100.0)
Platelets: 101 10*3/uL — ABNORMAL LOW (ref 150–400)
RBC: 4.72 MIL/uL (ref 4.22–5.81)
RDW: 15.9 % — ABNORMAL HIGH (ref 11.5–15.5)
WBC: 5.8 10*3/uL (ref 4.0–10.5)

## 2018-05-19 LAB — TYPE AND SCREEN
ABO/RH(D): A NEG
Antibody Screen: NEGATIVE

## 2018-05-19 LAB — COMPREHENSIVE METABOLIC PANEL
ALK PHOS: 166 U/L — AB (ref 38–126)
ALT: 49 U/L — ABNORMAL HIGH (ref 0–44)
ANION GAP: 9 (ref 5–15)
AST: 91 U/L — ABNORMAL HIGH (ref 15–41)
Albumin: 3.1 g/dL — ABNORMAL LOW (ref 3.5–5.0)
BUN: 7 mg/dL (ref 6–20)
CALCIUM: 8.7 mg/dL — AB (ref 8.9–10.3)
CO2: 22 mmol/L (ref 22–32)
Chloride: 99 mmol/L (ref 98–111)
Creatinine, Ser: 0.64 mg/dL (ref 0.61–1.24)
GFR calc non Af Amer: >60 mL/min (ref >60)
Glucose, Bld: 82 mg/dL (ref 70–99)
Potassium: 4.2 mmol/L (ref 3.5–5.1)
SODIUM: 130 mmol/L — AB (ref 135–145)
TOTAL PROTEIN: 6.7 g/dL (ref 6.5–8.1)
Total Bilirubin: 2.8 mg/dL — ABNORMAL HIGH (ref 0.3–1.2)

## 2018-05-19 LAB — PROTIME-INR
INR: 1.52
PROTHROMBIN TIME: 18.2 s — AB (ref 11.4–15.2)

## 2018-05-19 LAB — ABO/RH: ABO/RH(D): A NEG

## 2018-05-19 SURGERY — IR WITH ANESTHESIA
Anesthesia: General

## 2018-05-19 MED ORDER — PROPOFOL 10 MG/ML IV BOLUS
INTRAVENOUS | Status: DC | PRN
  Administered 2018-05-19 (×2): 100 mg via INTRAVENOUS

## 2018-05-19 MED ORDER — PHENYLEPHRINE HCL 10 MG/ML IJ SOLN
INTRAMUSCULAR | Status: DC | PRN
  Administered 2018-05-19 (×8): 40 ug via INTRAVENOUS

## 2018-05-19 MED ORDER — ROCURONIUM BROMIDE 10 MG/ML (PF) SYRINGE
PREFILLED_SYRINGE | INTRAVENOUS | Status: AC
  Filled 2018-05-19: qty 10

## 2018-05-19 MED ORDER — FLUOXETINE HCL 20 MG PO CAPS
40.0000 mg | ORAL_CAPSULE | Freq: Every day | ORAL | Status: DC
Start: 2018-05-19 — End: 2018-05-20
  Administered 2018-05-19 – 2018-05-20 (×2): 40 mg via ORAL
  Filled 2018-05-19 (×2): qty 2

## 2018-05-19 MED ORDER — LACTATED RINGERS IV SOLN
INTRAVENOUS | Status: DC
Start: 2018-05-19 — End: 2018-05-20
  Administered 2018-05-19 (×2): via INTRAVENOUS

## 2018-05-19 MED ORDER — LIDOCAINE HCL (CARDIAC) PF 100 MG/5ML IV SOSY
PREFILLED_SYRINGE | INTRAVENOUS | Status: DC | PRN
  Administered 2018-05-19: 60 mg via INTRAVENOUS

## 2018-05-19 MED ORDER — SUGAMMADEX SODIUM 200 MG/2ML IV SOLN
INTRAVENOUS | Status: DC | PRN
  Administered 2018-05-19: 150 mg via INTRAVENOUS

## 2018-05-19 MED ORDER — OXYCODONE HCL 5 MG PO TABS: 5.0000 mg | ORAL_TABLET | Freq: Four times a day (QID) | ORAL | Status: DC | PRN

## 2018-05-19 MED ORDER — ONDANSETRON HCL 4 MG PO TABS
4.0000 mg | ORAL_TABLET | Freq: Two times a day (BID) | ORAL | Status: DC
  Administered 2018-05-19: 4 mg via ORAL
  Filled 2018-05-19 (×2): qty 1

## 2018-05-19 MED ORDER — CEFAZOLIN SODIUM-DEXTROSE 2-4 GM/100ML-% IV SOLN
2.0000 g | INTRAVENOUS | Status: AC
  Administered 2018-05-19: 2 g via INTRAVENOUS
  Filled 2018-05-19: qty 100

## 2018-05-19 MED ORDER — FUROSEMIDE 40 MG PO TABS
40.0000 mg | ORAL_TABLET | Freq: Every day | ORAL | Status: DC
  Administered 2018-05-19 – 2018-05-20 (×2): 40 mg via ORAL
  Filled 2018-05-19 (×2): qty 1

## 2018-05-19 MED ORDER — SUCCINYLCHOLINE CHLORIDE 200 MG/10ML IV SOSY
PREFILLED_SYRINGE | INTRAVENOUS | Status: AC
  Filled 2018-05-19: qty 10

## 2018-05-19 MED ORDER — ROCURONIUM BROMIDE 100 MG/10ML IV SOLN
INTRAVENOUS | Status: DC | PRN
  Administered 2018-05-19: 20 mg via INTRAVENOUS
  Administered 2018-05-19: 50 mg via INTRAVENOUS
  Administered 2018-05-19: 10 mg via INTRAVENOUS

## 2018-05-19 MED ORDER — DEXAMETHASONE SODIUM PHOSPHATE 10 MG/ML IJ SOLN
INTRAMUSCULAR | Status: DC | PRN
  Administered 2018-05-19: 10 mg via INTRAVENOUS

## 2018-05-19 MED ORDER — ADULT MULTIVITAMIN W/MINERALS CH
1.0000 | ORAL_TABLET | Freq: Once | ORAL | Status: AC
  Administered 2018-05-19: 1 via ORAL
  Filled 2018-05-19: qty 1

## 2018-05-19 MED ORDER — SODIUM CHLORIDE 0.9 % IV SOLN: INTRAVENOUS | Status: DC

## 2018-05-19 MED ORDER — LIDOCAINE 2% (20 MG/ML) 5 ML SYRINGE
INTRAMUSCULAR | Status: AC
  Filled 2018-05-19: qty 5

## 2018-05-19 MED ORDER — ONDANSETRON HCL 4 MG/2ML IJ SOLN
INTRAMUSCULAR | Status: DC | PRN
  Administered 2018-05-19: 4 mg via INTRAVENOUS

## 2018-05-19 MED ORDER — IOHEXOL 300 MG/ML  SOLN: 130.0000 mL | Freq: Once | INTRAMUSCULAR | Status: DC | PRN

## 2018-05-19 MED ORDER — AMILORIDE HCL 5 MG PO TABS
5.0000 mg | ORAL_TABLET | Freq: Every day | ORAL | Status: DC
  Administered 2018-05-19 – 2018-05-20 (×2): 5 mg via ORAL
  Filled 2018-05-19 (×2): qty 1

## 2018-05-19 MED ORDER — PHENYLEPHRINE 40 MCG/ML (10ML) SYRINGE FOR IV PUSH (FOR BLOOD PRESSURE SUPPORT)
PREFILLED_SYRINGE | INTRAVENOUS | Status: AC
  Filled 2018-05-19: qty 10

## 2018-05-19 MED ORDER — LACTULOSE 10 GM/15ML PO SOLN
10.0000 g | Freq: Every day | ORAL | Status: DC
  Administered 2018-05-19 – 2018-05-20 (×2): 10 g via ORAL
  Filled 2018-05-19 (×2): qty 15

## 2018-05-19 MED ORDER — ALBUMIN HUMAN 5 % IV SOLN
INTRAVENOUS | Status: DC | PRN
  Administered 2018-05-19 (×2): via INTRAVENOUS

## 2018-05-19 MED ORDER — FENTANYL CITRATE (PF) 100 MCG/2ML IJ SOLN
INTRAMUSCULAR | Status: DC | PRN
  Administered 2018-05-19: 25 ug via INTRAVENOUS
  Administered 2018-05-19: 50 ug via INTRAVENOUS
  Administered 2018-05-19 (×2): 25 ug via INTRAVENOUS
  Administered 2018-05-19 (×2): 50 ug via INTRAVENOUS

## 2018-05-19 NOTE — Anesthesia Procedure Notes (Signed)
Procedure Name: Intubation Date/Time: 05/19/2018 8:47 AM Performed by: Shirlyn Goltz, CRNA Pre-anesthesia Checklist: Patient identified, Emergency Drugs available, Suction available, Patient being monitored and Timeout performed Patient Re-evaluated:Patient Re-evaluated prior to induction Oxygen Delivery Method: Circle system utilized Preoxygenation: Pre-oxygenation with 100% oxygen Induction Type: IV induction Ventilation: Mask ventilation without difficulty Laryngoscope Size: McGraph and 3 Grade View: Grade I Tube type: Oral Tube size: 7.5 mm Number of attempts: 2 Airway Equipment and Method: Stylet and Video-laryngoscopy Placement Confirmation: ETT inserted through vocal cords under direct vision,  positive ETCO2 and breath sounds checked- equal and bilateral Secured at: 21 cm Tube secured with: Tape Dental Injury: Injury to lip  Comments: DL x 1 with MAC 3. Grade 3 view. VL x 1 grade 1 view

## 2018-05-19 NOTE — Transfer of Care (Signed)
Immediate Anesthesia Transfer of Care Note  Patient: Kenneth [name]  Procedure(s) Performed: TIPS (N/A )  Patient Location: PACU  Anesthesia Type:General  Level of Consciousness: awake, alert , oriented and patient cooperative  Airway & Oxygen Therapy: Patient Spontanous Breathing and Patient connected to nasal cannula oxygen  Post-op Assessment: Report given to RN and Post -op Vital signs reviewed and stable  Post vital signs: Reviewed and stable  Last Vitals:  Vitals Value Taken Time  BP 97/59 05/19/2018 11:53 AM  Temp    Pulse 70 05/19/2018 11:54 AM  Resp    SpO2 96 % 05/19/2018 11:54 AM  Vitals shown include unvalidated device data.  Last Pain:  Vitals:   05/19/18 0656  TempSrc:   PainSc: 0-No pain      Patients Stated Pain Goal: 3 (05/19/18 [id number])  Complications: No apparent anesthesia complications

## 2018-05-19 NOTE — H&P (Signed)
Chief Complaint: Patient was seen in consultation today for Transjugular intrahepatic portal system shunt placement at the request of Dr Ellwood Dense   Supervising Physician: Simonne Come  Patient Status: [facility] - Out-pt  History of Present Illness: Kenneth [name] is a 57 y.o. male   Although sober now since 12/2015-- pt has had hx polysubstance abuse and alcohol abuse Refractory ascites More frequent and higher volume paracentesis; most recent 7/15: 8L Dr Ellwood Dense follows pt and consulted Dr Grace Isaac regarding TIPS procedure. Thinking is becoming more clear over last few weeks  Denies hematemasis; bloody stools. Denies jaundice Denies N/V Abdomen is full today- but not horribly uncomfortable  Dr Grace Isaac note 7/18: Abdominal MRI performed 7/5 and abdominal CT 04/26/2018 were negative for the presence of hepatocellular carcinoma and demonstrated patency of the hepatic and portal veins.  Additionally, AFP level obtained on 04/28/2018 was 2.0 Cardiac echo performed on 04/27/2018 demonstrates adequate cardiac function to allow for TIPS creation. Calculated Na-MELD score with most recent laboratories is 16 which places the patient in an acceptable range for TIPS creation. Details of TIPS creation were discussed in detail with the patient and the patient's sister.  I explained that the TIPS would be created in hopes of improving/resolving his recurrent ascites, however his underlying liver dysfunction will persist.   I explained that TIPS creation can be associated with worsening mental status, however I am hopeful this can be better managed once he starts taking his lactulose as prescribed.  Note, ammonia level from 04/28/2018 was within the normal range of 79.    MELD today: 14 Scheduled now for TIPS procedure in IR with Dr Grace Isaac  Past Medical History:  Diagnosis Date  . Alcohol abuse   . Anxiety   . Arthritis    "never diagnosis"  . Blood clot in vein   . Cirrhosis (HCC)   . Depression   .  Dyspnea    Due to ascites  . Esophageal varices (HCC) 09/02/2014  . H/O [facility] spotted fever    age 50 or 56  . Hernia, inguinal    right  . Hiatal hernia 09/02/2014  . Polysubstance abuse (HCC)   . Portal hypertension with esophageal varices (HCC) 09/02/2014  . Portal hypertensive gastropathy (HCC) 09/02/2014  . Thrombocytopenia (HCC) 09/02/2014  . Upper GI bleed 08/31/2014    Past Surgical History:  Procedure Laterality Date  . ESOPHAGOGASTRODUODENOSCOPY Left 09/01/2014   Procedure: ESOPHAGOGASTRODUODENOSCOPY (EGD);  Surgeon: Charna Elizabeth, MD;  Location: [facility] ENDOSCOPY;  Service: Endoscopy;  Laterality: Left;  . EYE SURGERY Left    age 47  . IR PARACENTESIS  03/16/2018  . IR PARACENTESIS  03/29/2018  . IR PARACENTESIS  04/19/2018  . IR PARACENTESIS  04/27/2018  . IR PARACENTESIS  05/10/2018  . IR RADIOLOGIST EVAL & MGMT  05/13/2018    Allergies: Latex  Medications: Prior to Admission medications   Medication Sig Start Date End Date Taking? Authorizing Provider  aMILoride (MIDAMOR) 5 MG tablet Take 1 tablet (5 mg total) by mouth daily. 05/05/18  Yes Danis, Starr Lake III, MD  diclofenac sodium (VOLTAREN) 1 % GEL Apply 4 g topically 4 (four) times daily. 12/15/17  Yes [name], MD  FLUoxetine (PROZAC) 40 MG capsule Take 1 capsule (40 mg total) by mouth daily. 04/28/18  Yes Newlin, Odette Horns, MD  furosemide (LASIX) 40 MG tablet TAKE 1 TABLET BY MOUTH DAILY. 05/17/18  Yes Newlin, Odette Horns, MD  lactulose (CHRONULAC) 10 GM/15ML solution Take 15 mLs (10  g total) by mouth daily. 04/28/18  Yes [name], MD  Multiple Vitamin (MULTIVITAMIN WITH MINERALS) TABS tablet Take 1 tablet by mouth as needed.    Yes [provider]  ondansetron (ZOFRAN) 4 MG tablet TAKE 1 TABLET BY MOUTH 2 TIMES DAILY AS NEEDED FOR NAUSEA OR VOMITING. 04/19/18  Yes Marcine Matar, MD     Family History  Problem Relation Age of Onset  . Leukemia Father        acute myloid   . Hyperlipidemia Father   .  Hypertension Father   . Colon cancer Neg Hx   . Esophageal cancer Neg Hx   . Pancreatic cancer Neg Hx   . Stomach cancer Neg Hx   . Liver disease Neg Hx     Social History   Socioeconomic History  . Marital status: Single    Spouse name: Not on file  . Number of children: 1  . Years of education: Not on file  . Highest education level: Not on file  Occupational History  . Occupation: Administrator  Social Needs  . Financial resource strain: Not on file  . Food insecurity:    Worry: Not on file    Inability: Not on file  . Transportation needs:    Medical: Not on file    Non-medical: Not on file  Tobacco Use  . Smoking status: Current Every Day Smoker    Packs/day: 0.50    Years: 10.00    Pack years: 5.00    Types: Cigarettes  . Smokeless tobacco: Never Used  Substance and Sexual Activity  . Alcohol use: No    Comment: no alcohol since 01/23/16  . Drug use: Yes    Frequency: 5.0 times per week    Types: Marijuana  . Sexual activity: Not on file  Lifestyle  . Physical activity:    Days per week: Not on file    Minutes per session: Not on file  . Stress: Not on file  Relationships  . Social connections:    Talks on phone: Not on file    Gets together: Not on file    Attends religious service: Not on file    Active member of club or organization: Not on file    Attends meetings of clubs or organizations: Not on file    Relationship status: Not on file  Other Topics Concern  . Not on file  Social History Narrative  . Not on file    Review of Systems: A 12 point ROS discussed and pertinent positives are indicated in the HPI above.  All other systems are negative.  Review of Systems  Constitutional: Positive for activity change, appetite change and fatigue. Negative for fever.  Respiratory: Positive for shortness of breath. Negative for cough.   Cardiovascular: Negative for chest pain.  Gastrointestinal: Positive for abdominal distention, blood in stool,  diarrhea, nausea and vomiting. Negative for abdominal pain.  Musculoskeletal: Negative for gait problem.  Neurological: Negative for weakness.  Psychiatric/Behavioral: Negative for behavioral problems and confusion.    Vital Signs: BP 108/71   Pulse 70   Temp 97.7 F (36.5 C) (Oral)   Resp 20   Ht 5\' 7"  (1.702 m)   Wt 140 lb (63.5 kg)   SpO2 95%   BMI 21.93 kg/m   Physical Exam  Constitutional: He is oriented to person, place, and time. He appears well-nourished.  HENT:  Head: Atraumatic.  Eyes: EOM are normal.  Neck: Neck supple.  Cardiovascular: Normal  rate, regular rhythm and normal heart sounds.  Pulmonary/Chest: Effort normal and breath sounds normal.  Abdominal: Soft. Bowel sounds are normal. He exhibits distension. There is no tenderness.  Musculoskeletal: Normal range of motion. He exhibits no edema.  Neurological: He is alert and oriented to person, place, and time.  Skin: Skin is warm and dry.  Psychiatric: He has a normal mood and affect. His behavior is normal. Judgment and thought content normal.  Nursing note and vitals reviewed.   Imaging: Ct Abdomen Pelvis W Contrast  Result Date: 04/26/2018 CLINICAL DATA:  57 year old male with history of cirrhosis. Ascites. Distended abdomen. EXAM: CT ABDOMEN AND PELVIS WITH CONTRAST TECHNIQUE: Multidetector CT imaging of the abdomen and pelvis was performed using the standard protocol following bolus administration of intravenous contrast. CONTRAST:  ISOVUE-300 IOPAMIDOL (ISOVUE-300) INJECTION 61% COMPARISON:  CT the abdomen and pelvis 01/24/2016. FINDINGS: Lower chest: Extensive volume loss and architectural distortion in the lower lungs bilaterally, likely to reflect a combination of chronic scarring and/or atelectasis. Hepatobiliary: Liver has a very shrunken appearance and nodular contour, indicative of underlying cirrhosis. In the inferior aspect of the left lobe of the liver, there appears to be a 2.8 x 3.2 cm lesion  which is slightly hypervascular (axial image 34 of series 2), although this may simply reflect heterogeneous perfusion, as much of the liver demonstrates relatively heterogeneous perfusion. Arterial phase images were not obtained. Focal atrophy of segments 4A, 4B, 5 and 8. No intra or extrahepatic biliary ductal dilatation. Multiple calcified gallstones lying dependently in the gallbladder. Gallbladder does not appear distended. Pancreas: No pancreatic mass. No pancreatic ductal dilatation. No pancreatic or peripancreatic fluid or inflammatory changes. Spleen: Spleen is enlarged measuring 16.9 x 6.6 x 14.3 cm (estimated splenic volume of 798 mL). Adrenals/Urinary Tract: Multiple subcentimeter low-attenuation lesions in both kidneys are too small to definitively characterize, but are statistically likely to represent tiny cysts. No hydroureteronephrosis. Urinary bladder is normal in appearance. Bilateral adrenal glands are normal in appearance. Stomach/Bowel: Normal appearance of the stomach. No pathologic dilatation of small bowel or colon. Normal appendix. Vascular/Lymphatic: Aortic atherosclerosis, without evidence of aneurysm or dissection in the abdominal or pelvic vasculature. Portal vein is mildly dilated measuring 16 mm in the porta hepatis. Multiple prominent borderline enlarged retroperitoneal lymph nodes are noted, nonspecific. Mildly enlarged aortocaval lymph node measuring 11 mm in short axis (axial image 41 of series 2). Mildly enlarged celiac axis lymph node (axial image 27 of series 2) measuring 11 mm in short axis. Reproductive: Prostate gland and seminal vesicles are unremarkable in appearance. Other: Large volume of ascites. No pneumoperitoneum. Umbilical hernia containing ascites. Bilateral inguinal hernias with ascites extending into the scrotum bilaterally (right greater than left). Musculoskeletal: There are no aggressive appearing lytic or blastic lesions noted in the visualized portions of  the skeleton. IMPRESSION: 1. Cirrhosis with stigmata of portal hypertension, including dilated portal vein and splenomegaly, as detailed above. There is a potential hypervascular area in the inferior aspect of the left lobe of the liver which is concerning for potential neoplasm such as hepatocellular carcinoma. However, this may simply reflect a benign perfusion anomaly. This is poorly evaluated on today's examination, and follow-up with MRI of the abdomen with and without IV gadolinium is strongly recommended in the near future to better evaluate this finding. Correlation with AFP level is also recommended. 2. Large volume of ascites. 3. Aortic atherosclerosis. 4. Umbilical hernia and bilateral inguinal hernias (right greater than left), containing ascites. 5. Additional incidental findings,  as above. Electronically Signed   By: Trudie Reed M.D.   On: 04/26/2018 11:05   Mr Liver W Wo Contrast  Result Date: 04/30/2018 CLINICAL DATA:  57 year old male with history of alcoholic cirrhosis. Hypervascular lesion noted in the liver on prior CT the abdomen and pelvis. Followup evaluation. EXAM: MRI ABDOMEN WITHOUT AND WITH CONTRAST TECHNIQUE: Multiplanar multisequence MR imaging of the abdomen was performed both before and after the administration of intravenous contrast. CONTRAST:  7mL EOVIST GADOXETATE DISODIUM 0.25 MOL/L IV SOLN COMPARISON:  CT the abdomen and pelvis 04/26/2018. No prior abdominal MRI. FINDINGS: Lower chest: Linear areas of signal intensity in the lower lobes of the lungs bilaterally correspond to areas of atelectasis and/or scarring noted on recent CT examination. Hepatobiliary: Liver has a very shrunken appearance and nodular contour, with severe atrophy of central portions of the liver including segments 4A, 4B, 5 and 8. The area of concern in the inferior aspect of the left lobe of the liver on the prior CT examination does not demonstrate definitive hypervascular enhancement on today's  study. The liver is very heterogeneous in signal intensity with multiple nodular appearing areas that are slightly T1 hyperintense on pre gadolinium images, reflective of multiple regenerative nodules. The areas of greatest focal atrophy demonstrate internal T2 hyperintensity, compatible with areas of confluent hepatic fibrosis. No definite suspicious hepatic lesions. No intra or extrahepatic biliary ductal dilatation. Several small filling defects are noted lying dependently in the gallbladder, compatible with gallstones. Gallbladder is not distended. Pancreas:  No pancreatic mass.  No pancreatic ductal dilatation. Spleen: Spleen is enlarged measuring 16.6 x 6.2 x 13.9 cm (estimated splenic volume of 715 mL). Adrenals/Urinary Tract: Several tiny subcentimeter T1 hypointense, T2 hyperintense, nonenhancing renal lesions are noted, compatible with tiny simple cysts. No hydroureteronephrosis in the visualized portions of the abdomen. Bilateral adrenal glands are normal in appearance. Stomach/Bowel: Unremarkable. Vascular/Lymphatic: No aneurysm identified in the visualized abdominal vasculature. There multiple prominent borderline enlarged and mildly enlarged retroperitoneal and upper abdominal lymph nodes, largest of which is in the aortocaval nodal station measuring 12 mm in short axis (axial image 80 of series 503), and in the celiac axis nodal station measuring up to 12 mm in short axis (axial image 54 of series 503). Other:  Large amount of ascites. Musculoskeletal: No aggressive appearing osseous lesions are noted in the visualized portions of the skeleton. IMPRESSION: 1. Advanced cirrhotic changes in the liver, with areas of confluent hepatic fibrosis, as above. The area of concern in the inferior aspect of the left lobe of the liver does not demonstrate definitive hypervascular enhancement or other imaging findings to suggest hepatocellular carcinoma at this time. 2. Splenomegaly. 3. Large volume of ascites. 4.  Nonspecific upper abdominal and retroperitoneal lymphadenopathy similar to the prior study, as above. Attention at time of routine followup examinations is recommended to ensure the stability of these findings. 5. Additional incidental findings, as above. Electronically Signed   By: Trudie Reed M.D.   On: 04/30/2018 10:12   Ir Radiologist Eval & Mgmt  Result Date: 05/13/2018 Please refer to notes tab for details about interventional procedure. (Op Note)  Ir Paracentesis  Result Date: 05/10/2018 INDICATION: Ascites secondary to alcoholic cirrhosis. Request for therapeutic paracentesis. EXAM: ULTRASOUND GUIDED THERAPEUTIC PARACENTESIS MEDICATIONS: 10 mL 2% lidocaine COMPLICATIONS: None immediate. PROCEDURE: Informed written consent was obtained from the patient after a discussion of the risks, benefits and alternatives to treatment. A timeout was performed prior to the initiation of the procedure. Initial ultrasound  scanning demonstrates a large amount of ascites within the right lower abdominal quadrant. The right lower abdomen was prepped and draped in the usual sterile fashion. 2% lidocaine was used for local anesthesia. Following this, a 19 gauge, 7-cm, Yueh catheter was introduced. An ultrasound image was saved for documentation purposes. The paracentesis was performed. The catheter was removed and a dressing was applied. The patient tolerated the procedure well without immediate post procedural complication. FINDINGS: A total of approximately 8L of golden yellow fluid was removed. Samples were not sent to the laboratory. IMPRESSION: Successful ultrasound-guided paracentesis yielding 8 liters of peritoneal fluid. Read by Lynnette CaffeyShannon Watterson, PA-C Electronically Signed   By: Richarda OverlieAdam  Henn M.D.   On: 05/10/2018 12:17   Ir Paracentesis  Result Date: 04/27/2018 CLINICAL DATA:  Cirrhosis. Recurrent abdominal ascites.Abdominal distension. EXAM: ULTRASOUND GUIDED PARACENTESIS TECHNIQUE: The procedure, risks  (including but not limited to bleeding, infection, organ damage ), benefits, and alternatives were explained to the patient. Questions regarding the procedure were encouraged and answered. The patient understands and consents to the procedure. Survey ultrasound of the abdomen was performed and an appropriate skin entry site in the right lower abdomen was selected. Skin site was marked, prepped with Betadine, and draped in usual sterile fashion, and infiltrated locally with 1% lidocaine. A Safe-T-Centesis needle was advanced into the peritoneal space until fluid could be aspirated. The sheath was advanced and the needle removed. 5 L of yellowascites were aspirated. Small amount of residual ascites noted. COMPLICATIONS: COMPLICATIONS none IMPRESSION: Technically successful ultrasound guided paracentesis, removing 5 L of ascites. Electronically Signed   By: Corlis Leak  Hassell M.D.   On: 04/27/2018 15:03   Ir Paracentesis  Result Date: 04/19/2018 INDICATION: Patient with history alcoholic cirrhosis with recurrent ascites. Request is made for therapeutic paracentesis. Patient received albumin with procedure today. EXAM: ULTRASOUND GUIDED THERAPEUTIC PARACENTESIS MEDICATIONS: 10 mL of 2% lidocaine COMPLICATIONS: None immediate. PROCEDURE: Informed written consent was obtained from the patient after a discussion of the risks, benefits and alternatives to treatment. A timeout was performed prior to the initiation of the procedure. Initial ultrasound scanning demonstrates a large amount of ascites within the right lower abdominal quadrant. The right lower abdomen was prepped and draped in the usual sterile fashion. 2% lidocaine was used for local anesthesia. Following this, a 19 gauge, 7-cm, Yueh catheter was introduced. An ultrasound image was saved for documentation purposes. The paracentesis was performed. The catheter was removed and a dressing was applied. The patient tolerated the procedure well without immediate post  procedural complication. FINDINGS: A total of approximately 9.8 L of clear gold fluid was removed. IMPRESSION: Successful ultrasound-guided paracentesis yielding 9.8 L of peritoneal fluid. Read by: Elwin MochaAlexandra Louk, PA-C Electronically Signed   By: Gilmer MorJaime  Wagner D.O.   On: 04/19/2018 12:09    Labs:  CBC: Recent Labs    02/26/18 1605 03/15/18 1421 04/16/18 1558 05/19/18 0636  WBC 6.0 4.1 5.7 5.8  HGB 13.0 12.5* 15.1 14.7  HCT 38.3* 38.8 44.4 42.4  PLT 85* 85* 91.0* PENDING    COAGS: Recent Labs    02/26/18 1605 03/15/18 1421 03/26/18 1346 04/16/18 1558 05/19/18 0636  INR 1.42 1.4* 1.4* 1.6* 1.52  APTT 37*  --  35*  --   --     BMP: Recent Labs    02/25/18 1137 02/26/18 1605 03/15/18 1421 04/16/18 1558 04/28/18 1042  NA 141 139 139 132* 138  K 3.8 3.6 3.9 3.6 4.2  CL 104 105 102 99 101  CO2 25 25 24 29 25   GLUCOSE 48* 87 84 97 78  BUN 4* 6 6 7 9   CALCIUM 8.5* 8.4* 8.2* 8.7 8.6*  CREATININE 0.74* 0.68 0.73* 0.69 0.67*  GFRNONAA 103 >60 104  --  107  GFRAA 119 >60 120  --  123    LIVER FUNCTION TESTS: Recent Labs    02/26/18 1605 03/15/18 1421 04/16/18 1558 04/28/18 1042  BILITOT 2.0* 1.0 2.3* 2.0*  AST 59* 60* 63* 80*  ALT 35 33 33 36  ALKPHOS 124 141* 157* 165*  PROT 7.1 6.2 7.5 6.6  ALBUMIN 3.2* 3.1* 3.2* 3.2*    TUMOR MARKERS: Recent Labs    04/16/18 1558  AFPTM 2.0    Assessment and Plan:  Hx alcohol and polysubstance abuse Sober since 12/2017 Refractory ascites Consulted with Dr Grace Isaac regarding Transjugular intrahepatic portal system shunt procedure Now scheduled for same MELD 14 today Risks and benefits of TIPS, BRTO and/or additional variceal embolization were discussed with the patient and/or the patient's family including, but not limited to, infection, bleeding, damage to adjacent structures, worsening hepatic and/or cardiac function, non-target embolization and death.   This interventional procedure involves the use of X-rays and  because of the nature of the planned procedure, it is possible that we will have prolonged use of X-ray fluoroscopy.  Potential radiation risks to you include (but are not limited to) the following: - A slightly elevated risk for cancer  several years later in life. This risk is typically less than 0.5% percent. This risk is low in comparison to the normal incidence of human cancer, which is 33% for women and 50% for men according to the [employer] Society. - Radiation induced injury can include skin redness, resembling a rash, tissue breakdown / ulcers and hair loss (which can be temporary or permanent).   The likelihood of either of these occurring depends on the difficulty of the procedure and whether you are sensitive to radiation due to previous procedures, disease, or genetic conditions.   IF your procedure requires a prolonged use of radiation, you will be notified and given written instructions for further action.  It is your responsibility to monitor the irradiated area for the 2 weeks following the procedure and to notify your physician if you are concerned that you have suffered a radiation induced injury.    All of the patient's questions were answered, patient is agreeable to proceed.  Consent signed and in chart.  Pt is aware he may be admitted overnight for observation. Plan for discharge in am He is agreeable to proceed  Thank you for this interesting consult.  I greatly enjoyed meeting GRADY MOHABIR and look forward to participating in their care.  A copy of this report was sent to the requesting provider on this date.  Electronically Signed: Robet Leu, PA-C 05/19/2018, 7:25 AM   I spent a total of    40 Minutes in face to face in clinical consultation, greater than 50% of which was counseling/coordinating care for TIPS

## 2018-05-19 NOTE — Anesthesia Postprocedure Evaluation (Signed)
Anesthesia Post Note  Patient: Kenneth [name]  Procedure(s) Performed: TIPS (N/A )     Patient location during evaluation: PACU Anesthesia Type: General Level of consciousness: awake and alert Pain management: pain level controlled Vital Signs Assessment: post-procedure vital signs reviewed and stable Respiratory status: spontaneous breathing, nonlabored ventilation, respiratory function stable and patient connected to nasal cannula oxygen Cardiovascular status: blood pressure returned to baseline and stable Postop Assessment: no apparent nausea or vomiting Anesthetic complications: no    Last Vitals:  Vitals:   05/19/18 1208 05/19/18 1223  BP: (!) 94/58 (!) 97/58  Pulse: 66 68  Resp: 15 15  Temp:    SpO2: 95% 97%    Last Pain:  Vitals:   05/19/18 0656  TempSrc:   PainSc: 0-No pain                 Beryle Lathehomas E Brock

## 2018-05-19 NOTE — Progress Notes (Signed)
Received patient from PACU. Patient alert and oriented x4. Patient ambulated from stretcher to chair. Patient oriented to call bell. No complaints of pain at this time. Will continue to monitor.

## 2018-05-19 NOTE — Procedures (Signed)
Pre procedural Dx: Alcoholic cirrhosis Post procedural Dx: Same  Technically successful creation of a TIPS via the right hepatic and portal veins with reduction in portosystemic gradient from 16 mmHg to 7 mmHg.  Successful ultrasound-guided paracentesis yielding 4.7 L of ascitic fluid.   EBL: Minimal  Complications: None immediate  Katherina RightJay Braedan Meuth, MD Pager #: [phone]

## 2018-05-19 NOTE — Progress Notes (Signed)
Supervising Physician: Simonne Come  Patient Status:  [facility] - In-pt  Chief Complaint: S/p TIPS this AM  Subjective:  57 y/o male PMH significant for polysubstance abuse, cirrhosis, recurrent ascites. Patient underwent TIPS this morning without complication noted. States he feels "great" and is hungry. He denies pain anywhere.   Allergies: Latex  Medications: Prior to Admission medications   Medication Sig Start Date End Date Taking? Authorizing Provider  aMILoride (MIDAMOR) 5 MG tablet Take 1 tablet (5 mg total) by mouth daily. 05/05/18  Yes Danis, Starr Lake III, MD  diclofenac sodium (VOLTAREN) 1 % GEL Apply 4 g topically 4 (four) times daily. 12/15/17  Yes [name], MD  FLUoxetine (PROZAC) 40 MG capsule Take 1 capsule (40 mg total) by mouth daily. 04/28/18  Yes Newlin, Odette Horns, MD  furosemide (LASIX) 40 MG tablet TAKE 1 TABLET BY MOUTH DAILY. 05/17/18  Yes [name], MD  lactulose (CHRONULAC) 10 GM/15ML solution Take 15 mLs (10 g total) by mouth daily. 04/28/18  Yes [name], MD  Multiple Vitamin (MULTIVITAMIN WITH MINERALS) TABS tablet Take 1 tablet by mouth as needed.    Yes [provider]  ondansetron (ZOFRAN) 4 MG tablet TAKE 1 TABLET BY MOUTH 2 TIMES DAILY AS NEEDED FOR NAUSEA OR VOMITING. 04/19/18  Yes Marcine Matar, MD     Vital Signs: BP (!) 88/65 (BP Location: Right Arm)   Pulse 61   Temp 97.7 F (36.5 C) (Oral)   Resp 16   Ht 5\' 7"  (1.702 m)   Wt 140 lb (63.5 kg)   SpO2 96%   BMI 21.93 kg/m   Physical Exam  Constitutional: He is oriented to person, place, and time. No distress.  HENT:  Head: Normocephalic.  Neck:  Incision site on right side of neck without pain to palpation or hematoma.   Pulmonary/Chest: Effort normal.  Abdominal: Soft.  Incisions RUQ and RLQ both without pain or hematoma on exam.  Genitourinary:  Genitourinary Comments: Foley in place with dark yellow urine in bag  Neurological: He is alert and oriented to  person, place, and time.  Skin: Skin is warm and dry. No rash noted. He is not diaphoretic.  Psychiatric: He has a normal mood and affect. His behavior is normal. Judgment and thought content normal.    Imaging: Ir Tips  Result Date: 05/19/2018 CLINICAL DATA:  History of alcoholic cirrhosis with recurrent symptomatic ascites. Request made for creation of a TIPS. Please refer to formal consultation in the epic EMR dated 05/13/2018 for additional details. EXAM: 1. TRANSJUGULAR INTRAHEPATIC PORTOSYSTEMIC SHUNT 2. IR PARACENTESIS MEDICATIONS: As antibiotic prophylaxis, Ancef 2 gm IV was ordered pre-procedure and administered intravenously within one hour of incision. ANESTHESIA/SEDATION: General - as administered by the Anesthesia department CONTRAST:  130 cc Isovue 300 FLUOROSCOPY TIME:  Fluoroscopy Time: 25 minutes 42 seconds (937 mGy). COMPLICATIONS: None immediate. PROCEDURE: Informed written consent was obtained from the patient after a thorough discussion of the procedural risks, benefits and alternatives. All questions were addressed. Maximal Sterile Barrier Technique was utilized including caps, mask, sterile gowns, sterile gloves, sterile drape, hand hygiene and skin antiseptic. A timeout was performed prior to the initiation of the procedure. Initial ultrasound scanning demonstrates a moderate to large amount of ascites within the right lower abdominal quadrant. The right lower abdomen was prepped and draped in the usual sterile fashion. 1% lidocaine with epinephrine was used for local anesthesia. An ultrasound image was saved for documentation purposed. An 8 Fr Safe-T-Centesis catheter  was introduced. The paracentesis was performed as we proceeded with the TIPS creation. The skin overlying the right upper abdominal quadrant as well as the right neck were prepped and draped in usual sterile fashion. Under direct ultrasound guidance, a peripheral aspect of the right portal vein was accessed with a 22  gauge needle. Un ultrasound image was saved for procedural documentation purposes. The track was dilated with the inner 3 French catheter from the Accustick set. Several contrast limited portal venograms were performed. A Nitrex wire was advanced through the 3 French catheter with the radiopaque transition positioned at the target entrance to the right portal vein. Next, the right internal jugular vein was accessed under direct ultrasound. Ultrasound image was saved for procedural documentation purposes. This allowed for placement of the 10 French TIPS vascular sheath. With the use of a stiff Glidewire, an MPA catheter was utilized to sequentially select the right, middle and left hepatic veins with dedicated hepatic venograms performed at each location. Given patient's anatomy, the decision was made to proceed with attempted creation of a right hepatic vein to right portal vein TIPS. Under fluoroscopic guidance, the right portal vein with targeted with a TIPS needle directed at the radiopaque target within the right portal vein. With a single pass, the right portal vein was accessed at a desirable location allowing advancement of a stiff Glidewire into the main portal vein. A 4 French glide catheter was advanced over the stiff Glidewire and a portal venogram was performed. Next, as there was difficulty advancing a measuring catheter through the intrahepatic track, the track was dilated to 5 mm diameter allowed advancement of the TIPS sheath into the right portal vein. A portal venogram was performed with a measuring Omni Flush catheter. Portosystemic gradient measurements were obtained. Next, a 2 cm (un covered) x 8 cm (covered) x 10 mm Viatorr stent was advanced through the intra portal track and deployed. The TIPS stent was angioplastied in multiple stations to 8 mm diameter. Portosystemic gradient measurements were obtained. Completion portal venograms were performed in various obliquities. The procedure was  terminated. All wires catheters and sheaths were removed from the patient. Hemostasis was achieved at the right neck and right upper abdominal access sites with manual compression. The paracentesis catheter was removed and superficial hemostasis was achieved with manual compression. Dressings were placed. The patient tolerated the procedure well without immediate postprocedural complication, was extubated and transported to the PACU for recovery. FINDINGS: Ultrasound scanning demonstrates recurrent moderate to large volume intra-abdominal ascites. Ultrasound-guided paracentesis was performed yielding 4.7 L of serous ascitic fluid. Pre TIPS creation pressure measurements: Right atrial pressure mean - 8 mmHg Portal pressure mean - 24 mmHg Prox systemic gradient - 16 mmHg Post TIPS creation pressure measurements: Right atrial pressure- mean - 10 mmHg Portal pressure - mean - 17 mmHg Portosystemic gradient - 7 mmHg Technically successful creation of a TIPS via the right hepatic and right portal veins with reduction of portosystemic gradient from 16 mmHg to 7 mmHg. Postprocedural portal venogram is negative for any significant gastric or esophageal varices. IMPRESSION: 1. Successful creation of a TIPS with reduction of portosystemic gradient from 16 mmHg to 7 mmHg. 2. Successful ultrasound-guided paracentesis yielding 4.7 L of ascitic fluid. PLAN: - The patient admitted overnight for observation. - Repeat paracentesis may be required, however would wait at least 4-5 days to ensure recovery from today's procedure. - Patient be seen in follow-up consultation at the interventional radiology clinic with postprocedural TIPS ultrasound and labs (CMP,  INR and ammonia levels) in 4-6 weeks. Electronically Signed   By: Simonne ComeJohn  Watts M.D.   On: 05/19/2018 11:50   Ir Paracentesis  Result Date: 05/19/2018 CLINICAL DATA:  History of alcoholic cirrhosis with recurrent symptomatic ascites. Request made for creation of a TIPS. Please  refer to formal consultation in the epic EMR dated 05/13/2018 for additional details. EXAM: 1. TRANSJUGULAR INTRAHEPATIC PORTOSYSTEMIC SHUNT 2. IR PARACENTESIS MEDICATIONS: As antibiotic prophylaxis, Ancef 2 gm IV was ordered pre-procedure and administered intravenously within one hour of incision. ANESTHESIA/SEDATION: General - as administered by the Anesthesia department CONTRAST:  130 cc Isovue 300 FLUOROSCOPY TIME:  Fluoroscopy Time: 25 minutes 42 seconds (937 mGy). COMPLICATIONS: None immediate. PROCEDURE: Informed written consent was obtained from the patient after a thorough discussion of the procedural risks, benefits and alternatives. All questions were addressed. Maximal Sterile Barrier Technique was utilized including caps, mask, sterile gowns, sterile gloves, sterile drape, hand hygiene and skin antiseptic. A timeout was performed prior to the initiation of the procedure. Initial ultrasound scanning demonstrates a moderate to large amount of ascites within the right lower abdominal quadrant. The right lower abdomen was prepped and draped in the usual sterile fashion. 1% lidocaine with epinephrine was used for local anesthesia. An ultrasound image was saved for documentation purposed. An 8 Fr Safe-T-Centesis catheter was introduced. The paracentesis was performed as we proceeded with the TIPS creation. The skin overlying the right upper abdominal quadrant as well as the right neck were prepped and draped in usual sterile fashion. Under direct ultrasound guidance, a peripheral aspect of the right portal vein was accessed with a 22 gauge needle. Un ultrasound image was saved for procedural documentation purposes. The track was dilated with the inner 3 French catheter from the Accustick set. Several contrast limited portal venograms were performed. A Nitrex wire was advanced through the 3 French catheter with the radiopaque transition positioned at the target entrance to the right portal vein. Next, the  right internal jugular vein was accessed under direct ultrasound. Ultrasound image was saved for procedural documentation purposes. This allowed for placement of the 10 French TIPS vascular sheath. With the use of a stiff Glidewire, an MPA catheter was utilized to sequentially select the right, middle and left hepatic veins with dedicated hepatic venograms performed at each location. Given patient's anatomy, the decision was made to proceed with attempted creation of a right hepatic vein to right portal vein TIPS. Under fluoroscopic guidance, the right portal vein with targeted with a TIPS needle directed at the radiopaque target within the right portal vein. With a single pass, the right portal vein was accessed at a desirable location allowing advancement of a stiff Glidewire into the main portal vein. A 4 French glide catheter was advanced over the stiff Glidewire and a portal venogram was performed. Next, as there was difficulty advancing a measuring catheter through the intrahepatic track, the track was dilated to 5 mm diameter allowed advancement of the TIPS sheath into the right portal vein. A portal venogram was performed with a measuring Omni Flush catheter. Portosystemic gradient measurements were obtained. Next, a 2 cm (un covered) x 8 cm (covered) x 10 mm Viatorr stent was advanced through the intra portal track and deployed. The TIPS stent was angioplastied in multiple stations to 8 mm diameter. Portosystemic gradient measurements were obtained. Completion portal venograms were performed in various obliquities. The procedure was terminated. All wires catheters and sheaths were removed from the patient. Hemostasis was achieved at the right neck  and right upper abdominal access sites with manual compression. The paracentesis catheter was removed and superficial hemostasis was achieved with manual compression. Dressings were placed. The patient tolerated the procedure well without immediate postprocedural  complication, was extubated and transported to the PACU for recovery. FINDINGS: Ultrasound scanning demonstrates recurrent moderate to large volume intra-abdominal ascites. Ultrasound-guided paracentesis was performed yielding 4.7 L of serous ascitic fluid. Pre TIPS creation pressure measurements: Right atrial pressure mean - 8 mmHg Portal pressure mean - 24 mmHg Prox systemic gradient - 16 mmHg Post TIPS creation pressure measurements: Right atrial pressure- mean - 10 mmHg Portal pressure - mean - 17 mmHg Portosystemic gradient - 7 mmHg Technically successful creation of a TIPS via the right hepatic and right portal veins with reduction of portosystemic gradient from 16 mmHg to 7 mmHg. Postprocedural portal venogram is negative for any significant gastric or esophageal varices. IMPRESSION: 1. Successful creation of a TIPS with reduction of portosystemic gradient from 16 mmHg to 7 mmHg. 2. Successful ultrasound-guided paracentesis yielding 4.7 L of ascitic fluid. PLAN: - The patient admitted overnight for observation. - Repeat paracentesis may be required, however would wait at least 4-5 days to ensure recovery from today's procedure. - Patient be seen in follow-up consultation at the interventional radiology clinic with postprocedural TIPS ultrasound and labs (CMP, INR and ammonia levels) in 4-6 weeks. Electronically Signed   By: Simonne Come M.D.   On: 05/19/2018 11:50    Labs:  CBC: Recent Labs    02/26/18 1605 03/15/18 1421 04/16/18 1558 05/19/18 0636  WBC 6.0 4.1 5.7 5.8  HGB 13.0 12.5* 15.1 14.7  HCT 38.3* 38.8 44.4 42.4  PLT 85* 85* 91.0* 101*    COAGS: Recent Labs    02/26/18 1605 03/15/18 1421 03/26/18 1346 04/16/18 1558 05/19/18 0636  INR 1.42 1.4* 1.4* 1.6* 1.52  APTT 37*  --  35*  --   --     BMP: Recent Labs    02/26/18 1605 03/15/18 1421 04/16/18 1558 04/28/18 1042 05/19/18 0636  NA 139 139 132* 138 130*  K 3.6 3.9 3.6 4.2 4.2  CL 105 102 99 101 99  CO2 25 24 29  25 22   GLUCOSE 87 84 97 78 82  BUN 6 6 7 9 7   CALCIUM 8.4* 8.2* 8.7 8.6* 8.7*  CREATININE 0.68 0.73* 0.69 0.67* 0.64  GFRNONAA >60 104  --  107 >60  GFRAA >60 120  --  123 >60    LIVER FUNCTION TESTS: Recent Labs    03/15/18 1421 04/16/18 1558 04/28/18 1042 05/19/18 0636  BILITOT 1.0 2.3* 2.0* 2.8*  AST 60* 63* 80* 91*  ALT 33 33 36 49*  ALKPHOS 141* 157* 165* 166*  PROT 6.2 7.5 6.6 6.7  ALBUMIN 3.1* 3.2* 3.2* 3.1*    Assessment and Plan:  S/p TIPS this AM - patient doing well, feels great, sitting up in chair, ready to eat. Would like to leave as early as possible. Foley remains in place currently - will d/c Foley tonight for voiding trial. Follow up in AM for likely d/c if does well overnight.  Electronically Signed: Villa Herb, PA-C 05/19/2018, 4:14 PM   I spent a total of 15 Minutes at the the patient's bedside AND on the patient's hospital floor or unit, greater than 50% of which was counseling/coordinating care for s/p TIPS.

## 2018-05-19 NOTE — Anesthesia Procedure Notes (Signed)
Arterial Line Insertion Start/End7/24/2019 7:55 AM, 05/19/2018 8:10 AM Performed by: Adair LaundryPaxton, Lynn A, CRNA, CRNA  Patient location: Pre-op. Preanesthetic checklist: patient identified, IV checked, site marked, risks and benefits discussed, surgical consent, monitors and equipment checked, pre-op evaluation, timeout performed and anesthesia consent Lidocaine 1% used for infiltration Right, radial was placed Catheter size: 20 G Hand hygiene performed , maximum sterile barriers used  and Seldinger technique used Allen's test indicative of satisfactory collateral circulation Attempts: 2 Procedure performed without using ultrasound guided technique. Following insertion, Biopatch and dressing applied. Post procedure assessment: normal  Patient tolerated the procedure well with no immediate complications.

## 2018-05-20 ENCOUNTER — Encounter (HOSPITAL_COMMUNITY): Payer: Self-pay | Admitting: Interventional Radiology

## 2018-05-20 ENCOUNTER — Encounter: Payer: Self-pay | Admitting: Gastroenterology

## 2018-05-20 DIAGNOSIS — K7031 Alcoholic cirrhosis of liver with ascites: Secondary | ICD-10-CM | POA: Diagnosis not present

## 2018-05-20 NOTE — Progress Notes (Signed)
Patient is being discharged home. Discharge instructions were given to patient and family 

## 2018-05-20 NOTE — Discharge Summary (Addendum)
Patient ID: Kenneth [name] MRN: [id number] DOB/AGE: Oct 27, 1961 57 y.o.  Admit date: 05/19/2018 Discharge date: 05/20/2018  Supervising Physician: Simonne Come  Patient Status: [facility] - In-pt  Admission Diagnoses: refractory ascites secondary cirrhosis  Discharge Diagnoses:  Active Problems:   Ascites due to alcoholic cirrhosis [facility])   Discharged Condition: stable  Hospital Course: Refractory ascites; Transjugular Intrahepatic Portal System Shunt procedure performed in IR with Dr Grace Isaac 05/18/18. Overnight admission for observation Pt has done well overnight; no incidents during admission. Eating well; slept well Denies N/V Denies pain Urinating well; passing gas I have seen and examined pt. Cognition is stable; does repeat himself in conversation a few times; but answers all questions appropriately. Carrying on conversation with good comprehension I have discussed findings with Dr Grace Isaac Plan for discharge to home.  Consults: None  Significant Diagnostic Studies: IR/US procedure  Treatments: Technically successful creation of a TIPS via the right hepatic and portal veins with reduction in portosystemic gradient from 16 mmHg to 7 mmHg. Successful ultrasound-guided paracentesis yielding 4.7 L of ascitic fluid.   Discharge Exam: Blood pressure (!) 92/55, pulse 64, temperature 97.7 F (36.5 C), temperature source Oral, resp. rate 19, height 5\' 7"  (1.702 m), weight 140 lb (63.5 kg), SpO2 95 %.  Pleasant  A/O All conversation is coherent-- does repeat himself during visit a few times Neck: Rt IJ procedure site is clean and dry; NT ; No hematoma Heart: RRR Lungs: CTA Abd: soft; +BS NT; no masses; less distended 2 sites at right abdomen are NT no bleeding; no hematoma No sign of infection Extr: FROM; no swelling; gait is steady-- stands and walks to bathroom without help Skin is clean and dry UOP great; clear yellow  Results for orders placed or performed during the  hospital encounter of 05/19/18  CBC  Result Value Ref Range   WBC 5.8 4.0 - 10.5 K/uL   RBC 4.72 4.22 - 5.81 MIL/uL   Hemoglobin 14.7 13.0 - 17.0 g/dL   HCT 78.2 95.6 - 21.3 %   MCV 89.8 78.0 - 100.0 fL   MCH 31.1 26.0 - 34.0 pg   MCHC 34.7 30.0 - 36.0 g/dL   RDW 08.6 (H) 57.8 - 46.9 %   Platelets 101 (L) 150 - 400 K/uL  Comprehensive metabolic panel  Result Value Ref Range   Sodium 130 (L) 135 - 145 mmol/L   Potassium 4.2 3.5 - 5.1 mmol/L   Chloride 99 98 - 111 mmol/L   CO2 22 22 - 32 mmol/L   Glucose, Bld 82 70 - 99 mg/dL   BUN 7 6 - 20 mg/dL   Creatinine, Ser 6.29 0.61 - 1.24 mg/dL   Calcium 8.7 (L) 8.9 - 10.3 mg/dL   Total Protein 6.7 6.5 - 8.1 g/dL   Albumin 3.1 (L) 3.5 - 5.0 g/dL   AST 91 (H) 15 - 41 U/L   ALT 49 (H) 0 - 44 U/L   Alkaline Phosphatase 166 (H) 38 - 126 U/L   Total Bilirubin 2.8 (H) 0.3 - 1.2 mg/dL   GFR calc non Af Amer >60 >60 mL/min   GFR calc Af Amer >60 >60 mL/min   Anion gap 9 5 - 15  Protime-INR  Result Value Ref Range   Prothrombin Time 18.2 (H) 11.4 - 15.2 seconds   INR 1.52   Type and screen [name]  Result Value Ref Range   ABO/RH(D) A NEG    Antibody Screen NEG  Sample Expiration      05/22/2018 Performed at [facility], [address], [place], [place] [address]   ABO/Rh  Result Value Ref Range   ABO/RH(D)      A NEG Performed at [facility], [address] [place], [place] [address]      Disposition: TIPS procedure performed in IR with Dr Grace Isaac 7/23 Pt has done well overnight Plan for discharge today Dr Grace Isaac is aware of pt status Pt has good understanding of discharge instructions Continue all home meds Follow up in IR Clinic 4-6 weeks-- pt will hear from scheduler for time and date   Discharge Instructions    Call MD for:  difficulty breathing, headache or visual disturbances   Complete by:  As directed    Call MD for:  extreme fatigue   Complete by:  As directed    Call MD for:   hives   Complete by:  As directed    Call MD for:  persistant dizziness or light-headedness   Complete by:  As directed    Call MD for:  persistant nausea and vomiting   Complete by:  As directed    Call MD for:  redness, tenderness, or signs of infection (pain, swelling, redness, odor or green/yellow discharge around incision site)   Complete by:  As directed    Call MD for:  severe uncontrolled pain   Complete by:  As directed    Call MD for:  temperature >100.4   Complete by:  As directed    Diet - low sodium heart healthy   Complete by:  As directed    Discharge instructions   Complete by:  As directed    Continue all home meds; follow up appt with Dr Grace Isaac 4-6 weeks- scheduler will call pt with time and date (call [phone] if needs)   Discharge wound care:   Complete by:  As directed    Keep clean bandaid on right neck site for 1 week   Driving Restrictions   Complete by:  As directed    No driving x 4 days   Increase activity slowly   Complete by:  As directed    Lifting restrictions   Complete by:  As directed    No lifting over 10 lbs x 4 days     Allergies as of 05/20/2018      Reactions   Latex Swelling, Rash      Medication List    TAKE these medications   aMILoride 5 MG tablet Commonly known as:  MIDAMOR Take 1 tablet (5 mg total) by mouth daily.   diclofenac sodium 1 % Gel Commonly known as:  VOLTAREN Apply 4 g topically 4 (four) times daily.   FLUoxetine 40 MG capsule Commonly known as:  PROZAC Take 1 capsule (40 mg total) by mouth daily.   furosemide 40 MG tablet Commonly known as:  LASIX TAKE 1 TABLET BY MOUTH DAILY.   lactulose 10 GM/15ML solution Commonly known as:  CHRONULAC Take 15 mLs (10 g total) by mouth daily.   multivitamin with minerals Tabs tablet Take 1 tablet by mouth as needed.   ondansetron 4 MG tablet Commonly known as:  ZOFRAN TAKE 1 TABLET BY MOUTH 2 TIMES DAILY AS NEEDED FOR NAUSEA OR VOMITING.              Discharge Care Instructions  (From admission, onward)        Start     Ordered  05/20/18 0000  Discharge wound care:    Comments:  Keep clean bandaid on right neck site for 1 week   05/20/18 [id number]     Follow-up Information    Simonne ComeWatts, John, MD Follow up in 5 week(s).   Specialties:  Interventional Radiology, Radiology Why:  pt to follow up with Dr Grace IsaacWatts 4-6 weeks; pt will hear from scheduler for time and date; call [phone] if concerns or needs Contact information: [address] [place] [address] [phone]            Electronically Signed: Ralene MuskratURPIN,Caylor Cerino A, PA-C 05/20/2018, 8:52 AM   I have spent Greater Than 30 Minutes discharging Kenneth [name].

## 2018-05-21 ENCOUNTER — Telehealth: Payer: Self-pay | Admitting: *Deleted

## 2018-05-21 NOTE — Telephone Encounter (Signed)
Patient called back and I made him an appointment for 06-24-2018 at 11:30 am with Dr. Amada JupiterHenry Danis.

## 2018-05-21 NOTE — Telephone Encounter (Signed)
LM for the patient to call me back. Dr. Myrtie Neitheranis wants him to have a follow up appointment with him in 5-6 weeks. 8-30 or first week of September 2019).

## 2018-05-27 MED FILL — LACTULOSE 10 GM/15 ML SOLN: 10 | 16 days supply | Qty: 240 | Fill #2

## 2018-05-31 ENCOUNTER — Other Ambulatory Visit (HOSPITAL_COMMUNITY): Payer: Self-pay | Admitting: Interventional Radiology

## 2018-05-31 ENCOUNTER — Other Ambulatory Visit: Payer: Self-pay | Admitting: *Deleted

## 2018-05-31 DIAGNOSIS — K7031 Alcoholic cirrhosis of liver with ascites: Secondary | ICD-10-CM

## 2018-06-01 ENCOUNTER — Telehealth: Payer: Self-pay

## 2018-06-01 ENCOUNTER — Telehealth: Payer: Self-pay | Admitting: Gastroenterology

## 2018-06-01 ENCOUNTER — Other Ambulatory Visit: Payer: Self-pay

## 2018-06-01 DIAGNOSIS — K7031 Alcoholic cirrhosis of liver with ascites: Secondary | ICD-10-CM

## 2018-06-01 NOTE — Telephone Encounter (Signed)
Patient advised to come in for labs tomorrow, order in Epic. He will increase lactulose to QID. He asked about disability, directed him back to his PCP to start working on that process. If they need our records, will be glad to provide them. I will continue to look for an appointment for this week.

## 2018-06-01 NOTE — Telephone Encounter (Signed)
Patient is again having issues with ascites. He states that he has only had 4 bowel movements since 7/24 when he had TIPs procedure, he is taking lactulose 15 ml BID. Patient is wondering if there is some "stimulant" he can take, he is very fatigued. He has not gone back to work, he asked when he could go back, but is so fatigued and works outside, wondered if he would be able to return to work.

## 2018-06-01 NOTE — Telephone Encounter (Signed)
No APP appointments this week, first available is 8/14.

## 2018-06-01 NOTE — Telephone Encounter (Addendum)
Raynelle FanningJulie,    Have him come for labs Wednesday, 8/7:  CBC, CMP, INR, Ammonia  My office is full this week.  Please see if there is an APP appointment this week for My. Cleaves to be seen.  Then labs can be reviewed, blood pressure checked, and therefore see if possible to increase diuretic doses and whether ultrasound and paracentesis may be necessary.   I am not in the practice of using any particular "stimulant medicines" for fatigue.  My advice is to avoid such things as we do not know how they might affect his liver.  He can return to work when he feels well enough to do so.   Increase lactulose to 15 ml four times daily to relieve constipation.

## 2018-06-02 ENCOUNTER — Other Ambulatory Visit (INDEPENDENT_AMBULATORY_CARE_PROVIDER_SITE_OTHER): Payer: 59

## 2018-06-02 DIAGNOSIS — K7031 Alcoholic cirrhosis of liver with ascites: Secondary | ICD-10-CM | POA: Diagnosis not present

## 2018-06-02 LAB — COMPREHENSIVE METABOLIC PANEL
ALBUMIN: 2.7 g/dL — AB (ref 3.5–5.2)
ALT: 56 U/L — ABNORMAL HIGH (ref 0–53)
AST: 97 U/L — ABNORMAL HIGH (ref 0–37)
Alkaline Phosphatase: 187 U/L — ABNORMAL HIGH (ref 39–117)
BUN: 6 mg/dL (ref 6–23)
CHLORIDE: 102 meq/L (ref 96–112)
CO2: 28 meq/L (ref 19–32)
Calcium: 8.7 mg/dL (ref 8.4–10.5)
Creatinine, Ser: 0.57 mg/dL (ref 0.40–1.50)
GFR: 156.53 mL/min (ref >60.00)
Glucose, Bld: 102 mg/dL — ABNORMAL HIGH (ref 70–99)
Potassium: 3.3 mEq/L — ABNORMAL LOW (ref 3.5–5.1)
SODIUM: 135 meq/L (ref 135–145)
Total Bilirubin: 5.3 mg/dL — ABNORMAL HIGH (ref 0.2–1.2)
Total Protein: 5.9 g/dL — ABNORMAL LOW (ref 6.0–8.3)

## 2018-06-02 LAB — CBC WITH DIFFERENTIAL/PLATELET
BASOS PCT: 1.1 % (ref 0.0–3.0)
Basophils Absolute: 0.1 10*3/uL (ref 0.0–0.1)
EOS PCT: 2.6 % (ref 0.0–5.0)
Eosinophils Absolute: 0.1 10*3/uL (ref 0.0–0.7)
HEMATOCRIT: 39.9 % (ref 39.0–52.0)
HEMOGLOBIN: 14.2 g/dL (ref 13.0–17.0)
LYMPHS PCT: 15.2 % (ref 12.0–46.0)
Lymphs Abs: 0.8 10*3/uL (ref 0.7–4.0)
MCHC: 35.5 g/dL (ref 30.0–36.0)
MCV: 86.8 fl (ref 78.0–100.0)
MONO ABS: 1.3 10*3/uL — AB (ref 0.1–1.0)
Monocytes Relative: 24.8 % — ABNORMAL HIGH (ref 3.0–12.0)
NEUTROS ABS: 2.9 10*3/uL (ref 1.4–7.7)
Neutrophils Relative %: 56.3 % (ref 43.0–77.0)
Platelets: 73 10*3/uL — ABNORMAL LOW (ref 150.0–400.0)
RBC: 4.59 Mil/uL (ref 4.22–5.81)
RDW: 18.2 % — AB (ref 11.5–15.5)
WBC: 5.2 10*3/uL (ref 4.0–10.5)

## 2018-06-02 LAB — PROTIME-INR
INR: 1.9 ratio — AB (ref 0.8–1.0)
Prothrombin Time: 21.5 s — ABNORMAL HIGH (ref 9.6–13.1)

## 2018-06-02 LAB — AMMONIA: AMMONIA: 71 umol/L — AB (ref 11–35)

## 2018-06-02 NOTE — Telephone Encounter (Signed)
Patient advised of appointment, again reminded to get blood work today.

## 2018-06-02 NOTE — Telephone Encounter (Signed)
Then it will have to be lab work this week and APP appointment on the 14th.

## 2018-06-04 ENCOUNTER — Other Ambulatory Visit: Payer: Self-pay

## 2018-06-04 DIAGNOSIS — R410 Disorientation, unspecified: Secondary | ICD-10-CM

## 2018-06-04 MED ORDER — LACTULOSE 10 GM/15ML PO SOLN
10.0000 g | Freq: Four times a day (QID) | ORAL | 3 refills | Status: DC
Start: 2018-06-04 — End: 2018-07-06

## 2018-06-04 MED FILL — LACTULOSE 10 GM/15 ML SOLN: 10 | 7 days supply | Qty: 473 | Fill #0

## 2018-06-04 MED FILL — aMILoride HCL 5 MG TABS: 5 | 30 days supply | Qty: 30 | Fill #1

## 2018-06-09 ENCOUNTER — Ambulatory Visit (INDEPENDENT_AMBULATORY_CARE_PROVIDER_SITE_OTHER): Payer: 59 | Admitting: Physician Assistant

## 2018-06-09 ENCOUNTER — Other Ambulatory Visit (INDEPENDENT_AMBULATORY_CARE_PROVIDER_SITE_OTHER): Payer: 59

## 2018-06-09 ENCOUNTER — Encounter: Payer: Self-pay | Admitting: Physician Assistant

## 2018-06-09 VITALS — BP 100/62 | HR 70 | Ht 67.0 in | Wt 153.0 lb

## 2018-06-09 DIAGNOSIS — Z95828 Presence of other vascular implants and grafts: Secondary | ICD-10-CM

## 2018-06-09 DIAGNOSIS — K7031 Alcoholic cirrhosis of liver with ascites: Secondary | ICD-10-CM | POA: Diagnosis not present

## 2018-06-09 LAB — COMPREHENSIVE METABOLIC PANEL
ALK PHOS: 177 U/L — AB (ref 39–117)
ALT: 44 U/L (ref 0–53)
AST: 69 U/L — ABNORMAL HIGH (ref 0–37)
Albumin: 2.6 g/dL — ABNORMAL LOW (ref 3.5–5.2)
BUN: 6 mg/dL (ref 6–23)
CHLORIDE: 103 meq/L (ref 96–112)
CO2: 29 mEq/L (ref 19–32)
Calcium: 8.2 mg/dL — ABNORMAL LOW (ref 8.4–10.5)
Creatinine, Ser: 0.5 mg/dL (ref 0.40–1.50)
GFR: 182.07 mL/min (ref >60.00)
GLUCOSE: 77 mg/dL (ref 70–99)
POTASSIUM: 3.8 meq/L (ref 3.5–5.1)
SODIUM: 137 meq/L (ref 135–145)
TOTAL PROTEIN: 6.2 g/dL (ref 6.0–8.3)
Total Bilirubin: 6.8 mg/dL — ABNORMAL HIGH (ref 0.2–1.2)

## 2018-06-09 LAB — PROTIME-INR
INR: 1.8 ratio — ABNORMAL HIGH (ref 0.8–1.0)
Prothrombin Time: 20.6 s — ABNORMAL HIGH (ref 9.6–13.1)

## 2018-06-09 NOTE — Patient Instructions (Addendum)
Your provider has requested that you go to the basement level for lab work before leaving today. Press "B" on the elevator. The lab is located at the first door on the left as you exit the elevator.  We have scheduled you for a paracentesis 06/11/18 at 9 am.    Two Gram Sodium Diet 2000 mg  What is Sodium? Sodium is a mineral found naturally in many foods. The most significant source of sodium in the diet is table salt, which is about 40% sodium.  Processed, convenience, and preserved foods also contain a large amount of sodium.  The body needs only 500 mg of sodium daily to function,  A normal diet provides more than enough sodium even if you do not use salt.  Why Limit Sodium? A build up of sodium in the body can cause thirst, increased blood pressure, shortness of breath, and water retention.  Decreasing sodium in the diet can reduce edema and risk of heart attack or stroke associated with high blood pressure.  Keep in mind that there are many other factors involved in these health problems.  Heredity, obesity, lack of exercise, cigarette smoking, stress and what you eat all play a role.  General Guidelines:  Do not add salt at the table or in cooking.  One teaspoon of salt contains over 2 grams of sodium.  Read food labels  Avoid processed and convenience foods  Ask your dietitian before eating any foods not dicussed in the menu planning guidelines  Consult your physician if you wish to use a salt substitute or a sodium containing medication such as antacids.  Limit milk and milk products to 16 oz (2 cups) per day.  Shopping Hints:  READ LABELS!! "Dietetic" does not necessarily mean low sodium.  Salt and other sodium ingredients are often added to foods during processing.   Menu Planning Guidelines Food Group Choose More Often Avoid  Beverages (see also the milk group All fruit juices, low-sodium, salt-free vegetables juices, low-sodium carbonated beverages Regular vegetable or  tomato juices, commercially softened water used for drinking or cooking  Breads and Cereals Enriched white, wheat, rye and pumpernickel bread, hard rolls and dinner rolls; muffins, cornbread and waffles; most dry cereals, cooked cereal without added salt; unsalted crackers and breadsticks; low sodium or homemade bread crumbs Bread, rolls and crackers with salted tops; quick breads; instant hot cereals; pancakes; commercial bread stuffing; self-rising flower and biscuit mixes; regular bread crumbs or cracker crumbs  Desserts and Sweets Desserts and sweets mad with mild should be within allowance Instant pudding mixes and cake mixes  Fats Butter or margarine; vegetable oils; unsalted salad dressings, regular salad dressings limited to 1 Tbs; light, sour and heavy cream Regular salad dressings containing bacon fat, bacon bits, and salt pork; snack dips made with instant soup mixes or processed cheese; salted nuts  Fruits Most fresh, frozen and canned fruits Fruits processed with salt or sodium-containing ingredient (some dried fruits are processed with sodium sulfites        Vegetables Fresh, frozen vegetables and low- sodium canned vegetables Regular canned vegetables, sauerkraut, pickled vegetables, and others prepared in brine; frozen vegetables in sauces; vegetables seasoned with ham, bacon or salt pork  Condiments, Sauces, Miscellaneous  Salt substitute with physician's approval; pepper, herbs, spices; vinegar, lemon or lime juice; hot pepper sauce; garlic powder, onion powder, low sodium soy sauce (1 Tbs.); low sodium condiments (ketchup, chili sauce, mustard) in limited amounts (1 tsp.) fresh ground horseradish; unsalted tortilla chips, pretzels,  potato chips, popcorn, salsa (1/4 cup) Any seasoning made with salt including garlic salt, celery salt, onion salt, and seasoned salt; sea salt, rock salt, kosher salt; meat tenderizers; monosodium glutamate; mustard, regular soy sauce, barbecue, sauce,  chili sauce, teriyaki sauce, steak sauce, Worcestershire sauce, and most flavored vinegars; canned gravy and mixes; regular condiments; salted snack foods, olives, picles, relish, horseradish sauce, catsup   Food preparation: Try these seasonings Meats:    Pork Sage, onion Serve with applesauce  Chicken Poultry seasoning, thyme, parsley Serve with cranberry sauce  Lamb Curry powder, rosemary, garlic, thyme Serve with mint sauce or jelly  Veal Marjoram, basil Serve with current jelly, cranberry sauce  Beef Pepper, bay leaf Serve with dry mustard, unsalted chive butter  Fish Bay leaf, dill Serve with unsalted lemon butter, unsalted parsley butter  Vegetables:    Asparagus Lemon juice   Broccoli Lemon juice   Carrots Mustard dressing parsley, mint, nutmeg, glazed with unsalted butter and sugar   Green beans Marjoram, lemon juice, nutmeg,dill seed   Tomatoes Basil, marjoram, onion   Spice /blend for [employer]" 4 tsp ground thyme 1 tsp ground sage 3 tsp ground rosemary 4 tsp ground marjoram   Test your knowledge 1. A product that says "Salt Free" may still contain sodium. True or False 2. Garlic Powder and Hot Pepper Sauce an be used as alternative seasonings.True or False 3. Processed foods have more sodium than fresh foods.  True or False 4. Canned Vegetables have less sodium than froze True or False  WAYS TO DECREASE YOUR SODIUM INTAKE 1. Avoid the use of added salt in cooking and at the table.  Table salt (and other prepared seasonings which contain salt) is probably one of the greatest sources of sodium in the diet.  Unsalted foods can gain flavor from the sweet, sour, and butter taste sensations of herbs and spices.  Instead of using salt for seasoning, try the following seasonings with the foods listed.  Remember: how you use them to enhance natural food flavors is limited only by your creativity... Allspice-Meat, fish, eggs, fruit, peas, red and yellow vegetables Almond  Extract-Fruit baked goods Anise Seed-Sweet breads, fruit, carrots, beets, cottage cheese, cookies (tastes like licorice) Basil-Meat, fish, eggs, vegetables, rice, vegetables salads, soups, sauces Bay Leaf-Meat, fish, stews, poultry Burnet-Salad, vegetables (cucumber-like flavor) Caraway Seed-Bread, cookies, cottage cheese, meat, vegetables, cheese, rice Cardamon-Baked goods, fruit, soups Celery Powder or seed-Salads, salad dressings, sauces, meatloaf, soup, bread.Do not use  celery salt Chervil-Meats, salads, fish, eggs, vegetables, cottage cheese (parsley-like flavor) Chili Power-Meatloaf, chicken cheese, corn, eggplant, egg dishes Chives-Salads cottage cheese, egg dishes, soups, vegetables, sauces Cilantro-Salsa, casseroles Cinnamon-Baked goods, fruit, pork, lamb, chicken, carrots Cloves-Fruit, baked goods, fish, pot roast, green beans, beets, carrots Coriander-Pastry, cookies, meat, salads, cheese (lemon-orange flavor) Cumin-Meatloaf, fish,cheese, eggs, cabbage,fruit pie (caraway flavor) [employer], fruit, eggs, fish, poultry, cottage cheese, vegetables Dill Seed-Meat, cottage cheese, poultry, vegetables, fish, salads, bread Fennel Seed-Bread, cookies, apples, pork, eggs, fish, beets, cabbage, cheese, Licorice-like flavor Garlic-(buds or powder) Salads, meat, poultry, fish, bread, butter, vegetables, potatoes.Do not  use garlic salt Ginger-Fruit, vegetables, baked goods, meat, fish, poultry Horseradish Root-Meet, vegetables, butter Lemon Juice or Extract-Vegetables, fruit, tea, baked goods, fish salads Mace-Baked goods fruit, vegetables, fish, poultry (taste like nutmeg) Maple Extract-Syrups Marjoram-Meat, chicken, fish, vegetables, breads, green salads (taste like Sage) Mint-Tea, lamb, sherbet, vegetables, desserts, carrots, cabbage Mustard, Dry or Seed-Cheese, eggs, meats, vegetables, poultry Nutmeg-Baked goods, fruit, chicken, eggs, vegetables, desserts Onion Powder-Meat,  fish, poultry,  vegetables, cheese, eggs, bread, rice salads (Do not use   Onion salt) Orange Extract-Desserts, baked goods Oregano-Pasta, eggs, cheese, onions, pork, lamb, fish, chicken, vegetables, green salads Paprika-Meat, fish, poultry, eggs, cheese, vegetables Parsley Flakes-Butter, vegetables, meat fish, poultry, eggs, bread, salads (certain forms may   Contain sodium Pepper-Meat fish, poultry, vegetables, eggs Peppermint Extract-Desserts, baked goods Poppy Seed-Eggs, bread, cheese, fruit dressings, baked goods, noodles, vegetables, cottage  [employer], poultry, meat, fish, cauliflower, turnips,eggs bread Saffron-Rice, bread, veal, chicken, fish, eggs Sage-Meat, fish, poultry, onions, eggplant, tomateos, pork, stews Savory-Eggs, salads, poultry, meat, rice, vegetables, soups, pork Tarragon-Meat, poultry, fish, eggs, butter, vegetables (licorice-like flavor)  Thyme-Meat, poultry, fish, eggs, vegetables, (clover-like flavor), sauces, soups Tumeric-Salads, butter, eggs, fish, rice, vegetables (saffron-like flavor) Vanilla Extract-Baked goods, candy Vinegar-Salads, vegetables, meat marinades Walnut Extract-baked goods, candy  2. Choose your Foods Wisely   The following is a list of foods to avoid which are high in sodium:  Meats-Avoid all smoked, canned, salt cured, dried and kosher meat and fish as well as Anchovies   [employer] meats:Bologna, Liverwurst, Pastrami Canned meat or fish  Marinated herring Caviar    Pepperoni Corned Beef   Pizza Dried chipped beef  Salami Frozen breaded fish or meat Salt pork Frankfurters or hot dogs  Sardines Gefilte fish   Sausage Ham (boiled ham, Proscuitto Smoked butt    spiced ham)   Spam      TV Dinners Vegetables Canned vegetables (Regular) Relish Canned mushrooms  Sauerkraut Olives    Tomato juice Pickles  Bakery and Dessert Products Canned puddings  Cream  pies Cheesecake   Decorated cakes Cookies  Beverages/Juices Tomato juice, regular  Gatorade   V-8 vegetable juice, regular  Breads and Cereals Biscuit mixes   Salted potato chips, corn chips, pretzels Bread stuffing mixes  Salted crackers and rolls Pancake and waffle mixes Self-rising flour  Seasonings Accent    Meat sauces Barbecue sauce  Meat tenderizer Catsup    Monosodium glutamate (MSG) Celery salt   Onion salt Chili sauce   Prepared mustard Garlic salt   Salt, seasoned salt, sea salt Gravy mixes   Soy sauce Horseradish   Steak sauce Ketchup   Tartar sauce Lite salt    Teriyaki sauce Marinade mixes   Worcestershire sauce  Others Baking powder   Cocoa and cocoa mixes Baking soda   Commercial casserole mixes Candy-caramels, chocolate  Dehydrated soups    Bars, fudge,nougats  Instant rice and pasta mixes Canned broth or soup  Maraschino cherries Cheese, aged and processed cheese and cheese spreads  Learning Assessment Quiz  Indicated T (for True) or F (for False) for each of the following statements:  1. _____ Fresh fruits and vegetables and unprocessed grains are generally low in sodium 2. _____ Water may contain a considerable amount of sodium, depending on the source 3. _____ You can always tell if a food is high in sodium by tasting it 4. _____ Certain laxatives my be high in sodium and should be avoided unless prescribed   by a physician or pharmacist 5. _____ Salt substitutes may be used freely by anyone on a sodium restricted diet 6. _____ Sodium is present in table salt, food additives and as a natural component of   most foods 7. _____ Table salt is approximately 90% sodium 8. _____ Limiting sodium intake may help prevent excess fluid accumulation in the body 9. _____ On a sodium-restricted diet, seasonings such as bouillon soy sauce, and  cooking wine should be used in place of table salt 10. _____ On an ingredient list, a product which lists monosodium  glutamate as the first   ingredient is an appropriate food to include on a low sodium diet  Circle the best answer(s) to the following statements (Hint: there may be more than one correct answer)  11. On a low-sodium diet, some acceptable snack items are:    A. Olives  F. Bean dip   K. Grapefruit juice    B. Salted Pretzels G. Commercial Popcorn   L. Canned peaches    C. Carrot Sticks  H. Bouillon   M. Unsalted nuts   D. [place] fries  I. Peanut butter crackers N. Salami   E. Sweet pickles J. Tomato Juice   O. Pizza  12.  Seasonings that may be used freely on a reduced - sodium diet include   A. Lemon wedges F.Monosodium glutamate K. Celery seed    B.Soysauce   G. Pepper   L. Mustard powder   C. Sea salt  H. Cooking wine  M. Onion flakes   D. Vinegar  E. Prepared horseradish N. Salsa   E. Sage   J. Worcestershire sauce  O. Chutney

## 2018-06-09 NOTE — Addendum Note (Signed)
Addended by: Sharlee BlewBROWN, DESIREE P on: 06/09/2018 01:52 PM   Modules accepted: Orders

## 2018-06-09 NOTE — Progress Notes (Signed)
Chief Complaint: Alcoholic cirrhosis of liver with ascites  HPI:    [name] is a 57 year old male, who follows with Dr. Myrtie [name] for his history of alcoholic cirrhosis, who presents to clinic today with a complaint of recurrent ascites.    This patient is well-known to Dr. Myrtie [name].  Briefly he has had worsening ascites over the past few months with recurrent paracentesis.  At last visit 05/05/2018 was on Amiloride 5 mg once daily in addition to Lasix 40 mg once daily.  He was referred to interventional radiology for a TIPS placement.  There has been some trouble with his blood pressure in regards to diuretic use.     Patient underwent TIPS placement 05/19/2018.    Today, explains that 1 to 2 days after his TIPS he realized that his abdomen started growing again.  Tells me that he feels as though he has at least "6 L of fluid" on his stomach and would like to have this pulled off.  Explains that he has been experiencing an increase in fatigue since his TIPS procedure.  Has continued on his Lasix 40 mg daily and Amiloride 5 mg tablets twice daily.    Denies fever, chills, blood in his stool, nausea, vomiting, heartburn or reflux.  Past Medical History:  Diagnosis Date  . Alcohol abuse   . Anxiety   . Arthritis    "never diagnosis"  . Blood clot in vein   . Cirrhosis (HCC)   . Depression   . Dyspnea    Due to ascites  . Esophageal varices (HCC) 09/02/2014  . [facility]    age 57 [facility] [age]  . Hernia, inguinal    right  . Hiatal hernia 09/02/2014  . Polysubstance abuse (HCC)   . Portal hypertension with esophageal varices (HCC) 09/02/2014  . Portal hypertensive gastropathy (HCC) 09/02/2014  . Thrombocytopenia (HCC) 09/02/2014  . Upper GI bleed 08/31/2014    Past Surgical History:  Procedure Laterality Date  . ESOPHAGOGASTRODUODENOSCOPY Left 09/01/2014   Procedure: ESOPHAGOGASTRODUODENOSCOPY (EGD);  Surgeon: Charna ElizabethJyothi Mann, MD;  Location: [facility];  Service: Endoscopy;   Laterality: Left;  . EYE SURGERY Left    age 89  . IR PARACENTESIS  03/16/2018  . IR PARACENTESIS  03/29/2018  . IR PARACENTESIS  04/19/2018  . IR PARACENTESIS  04/27/2018  . IR PARACENTESIS  05/10/2018  . IR PARACENTESIS  05/19/2018  . IR RADIOLOGIST EVAL & MGMT  05/13/2018  . IR TIPS  05/19/2018  . RADIOLOGY WITH ANESTHESIA N/A 05/19/2018   Procedure: TIPS;  Surgeon: Kenneth [name], John, MD;  [facility];  Service: Radiology;  Laterality: N/A;    Current Outpatient Medications  Medication Sig Dispense Refill  . aMILoride (MIDAMOR) 5 MG tablet Take 1 tablet (5 mg total) by mouth daily. 30 tablet 1  . diclofenac sodium (VOLTAREN) 1 % GEL Apply 4 g topically 4 (four) times daily. 100 g 0  . FLUoxetine (PROZAC) 40 MG capsule Take 1 capsule (40 mg total) by mouth daily. 30 capsule 3  . furosemide (LASIX) 40 MG tablet TAKE 1 TABLET BY MOUTH DAILY. 30 tablet 2  . lactulose (CHRONULAC) 10 GM/15ML solution Take 15 mLs (10 g total) by mouth 4 (four) times daily. 473 mL 3  . Multiple Vitamin (MULTIVITAMIN WITH MINERALS) TABS tablet Take 1 tablet by mouth as needed.     . ondansetron (ZOFRAN) 4 MG tablet TAKE 1 TABLET BY MOUTH 2 TIMES DAILY AS NEEDED FOR NAUSEA OR VOMITING.  30 tablet 0   No current facility-administered medications for this visit.     Allergies as of 06/09/2018 - Review Complete 05/19/2018  Allergen Reaction Noted  . Latex Swelling and Rash 01/24/2016    Family History  Problem Relation Age of Onset  . Leukemia Father        acute myloid   . Hyperlipidemia Father   . Hypertension Father   . Colon cancer Neg Hx   . Esophageal cancer Neg Hx   . Pancreatic cancer Neg Hx   . Stomach cancer Neg Hx   . Liver disease Neg Hx     Social History   Socioeconomic History  . Marital status: Single    Spouse name: Not on file  . Number of children: 1  . Years of education: Not on file  . Highest education level: Not on file  Occupational History  . Occupation: Administratorlandscaper  Social  Needs  . Financial resource strain: Not on file  . Food insecurity:    Worry: Not on file    Inability: Not on file  . Transportation needs:    Medical: Not on file    Non-medical: Not on file  Tobacco Use  . Smoking status: Current Every Day Smoker    Packs/day: 0.50    Years: 10.00    Pack years: 5.00    Types: Cigarettes  . Smokeless tobacco: Never Used  Substance and Sexual Activity  . Alcohol use: No    Comment: no alcohol since 01/23/16  . Drug use: Yes    Frequency: 5.0 times per week    Types: Marijuana  . Sexual activity: Not on file  Lifestyle  . Physical activity:    Days per week: Not on file    Minutes per session: Not on file  . Stress: Not on file  Relationships  . Social connections:    Talks on phone: Not on file    Gets together: Not on file    Attends religious service: Not on file    Active member of club or organization: Not on file    Attends meetings of clubs or organizations: Not on file    Relationship status: Not on file  . Intimate partner violence:    Fear of current or ex partner: Not on file    Emotionally abused: Not on file    Physically abused: Not on file    Forced sexual activity: Not on file  Other Topics Concern  . Not on file  Social History Narrative  . Not on file    Review of Systems:    Constitutional: No weight loss, fever or chills Cardiovascular: No chest pain Respiratory: No SOB  Gastrointestinal: See HPI and otherwise negative   Physical Exam:  Vital signs: BP 100/62   Pulse 70   Ht 5\' 7"  (1.702 m)   Wt 153 lb (69.4 kg)   BMI 23.96 kg/m   Constitutional:   Pleasant Caucasian male appears to be in NAD, Well developed, Well nourished, alert and cooperative Respiratory: Respirations even and unlabored. Lungs clear to auscultation bilaterally.   No wheezes, crackles, or rhonchi.  Cardiovascular: Normal S1, S2. No MRG. Regular rate and rhythm. No peripheral edema, cyanosis or pallor.  Gastrointestinal:  Soft,  marked distension, nontender. No rebound or guarding. Normal bowel sounds. No appreciable masses or hepatomegaly. +fluid wave Psychiatric: Demonstrates good judgement and reason without abnormal affect or behaviors.  MOST RECENT LABS AND IMAGING: CBC    Component Value  Date/Time   WBC 5.2 06/02/2018 1553   RBC 4.59 06/02/2018 1553   HGB 14.2 06/02/2018 1553   HGB 12.5 (L) 03/15/2018 1421   HCT 39.9 06/02/2018 1553   HCT 38.8 03/15/2018 1421   PLT 73.0 (L) 06/02/2018 1553   PLT 85 (LL) 03/15/2018 1421   MCV 86.8 06/02/2018 1553   MCV 93 03/15/2018 1421   MCH 31.1 05/19/2018 0636   MCHC 35.5 06/02/2018 1553   RDW 18.2 (H) 06/02/2018 1553   RDW 15.4 03/15/2018 1421   LYMPHSABS 0.8 06/02/2018 1553   LYMPHSABS 0.5 (L) 03/15/2018 1421   MONOABS 1.3 (H) 06/02/2018 1553   EOSABS 0.1 06/02/2018 1553   EOSABS 0.2 03/15/2018 1421   BASOSABS 0.1 06/02/2018 1553   BASOSABS 0.0 03/15/2018 1421    CMP     Component Value Date/Time   NA 135 06/02/2018 1553   NA 138 04/28/2018 1042   K 3.3 (L) 06/02/2018 1553   CL 102 06/02/2018 1553   CO2 28 06/02/2018 1553   GLUCOSE 102 (H) 06/02/2018 1553   BUN 6 06/02/2018 1553   BUN 9 04/28/2018 1042   CREATININE 0.57 06/02/2018 1553   CREATININE 0.70 12/18/2016 1149   CALCIUM 8.7 06/02/2018 1553   PROT 5.9 (L) 06/02/2018 1553   PROT 6.6 04/28/2018 1042   ALBUMIN 2.7 (L) 06/02/2018 1553   ALBUMIN 3.2 (L) 04/28/2018 1042   AST 97 (H) 06/02/2018 1553   ALT 56 (H) 06/02/2018 1553   ALKPHOS 187 (H) 06/02/2018 1553   BILITOT 5.3 (H) 06/02/2018 1553   BILITOT 2.0 (H) 04/28/2018 1042   GFRNONAA >60 05/19/2018 0636   GFRNONAA >89 12/18/2016 1149   GFRAA >60 05/19/2018 0636   GFRAA >89 12/18/2016 1149    Assessment: 1.  Alcoholic cirrhosis with ascites: 05/19/2018 TIPS procedure with IR, paracentesis on that same day with 4.7 L of serous ascitic fluid, since then has been having increasing amount of fluid, currently on Lasix 40 mg daily and  Amiloride 5 mg twice daily  Plan: 1.  Scheduled patient for a ultrasound-guided paracentesis with fluid for cell count and differential, max 7-8 L with 50G IV Albumin given at that time 2.  Patient's blood pressure today is 100/62, patient may be able to have increased diuretics in the future, pending labs 3.  Ordered a CMP and INR today as this was not ordered and not done per Dr. Irving Burton recommendations 4.  Continue to recommend a less than 2 g sodium diet.  Did provide the patient with a handout regarding this and discussed it. 5.  Patient will follow in clinic with Dr. Myrtie [name] at his next available appointment or sooner, per Dr. Myrtie [name], after results of labs and per patient symptoms 6.  Did discuss with patient that there may be a possibility of needing referral for a liver transplant in the future pending his fluid reaccumulation over the next month or so  Hyacinth Meeker, PA-C [facility] 06/09/2018, 1:17 PM  Cc: [name], MD

## 2018-06-10 ENCOUNTER — Telehealth: Payer: Self-pay | Admitting: Gastroenterology

## 2018-06-10 NOTE — Progress Notes (Signed)
Thank you for sending this case to me. I have reviewed the entire note, and the outlined plan seems appropriate.  See my telephone note from today regarding his lab results and referral to liver transplant clinic.  Amada JupiterHenry Danis, MD

## 2018-06-10 NOTE — Telephone Encounter (Signed)
Kenneth [name] saw this patient yesterday.   He has cirrhosis and large volume ascites that has recurred after recent TIPS placement.  His labs from yesterday show that his liver function has worsened since even last week, but I do not know why.  He will have a therapeutic paracentesis 8/16, and should proceed with that.  I have spoken with both Kenneth [name] , who placed the TIPS, and a hepatologist/liver transplant doctor in [place] (Kenneth [name]).  Kenneth [name] needs to be seen next week by Kenneth [name] regarding his liver. It must be at Kenneth [name]'s office in [place], [place].  Further tests to be determined at that visit.  Please send Urgent referral to [name] at [facility] at fax [phone]  Send all our office notes, last year's worth of CBC, CMP, INR, AFP as well as recent CT scan abdomen, ultrasounds, paracentesis reports and TIPS placement report from 05/19/18.

## 2018-06-11 ENCOUNTER — Encounter (HOSPITAL_COMMUNITY): Payer: Self-pay | Admitting: Student

## 2018-06-11 ENCOUNTER — Telehealth: Payer: Self-pay

## 2018-06-11 ENCOUNTER — Ambulatory Visit (HOSPITAL_COMMUNITY)
Admission: RE | Admit: 2018-06-11 | Discharge: 2018-06-11 | Disposition: A | Discharge status: Home / Self Care | Payer: 59 | Source: Ambulatory Visit | Attending: Physician Assistant | Admitting: Physician Assistant

## 2018-06-11 DIAGNOSIS — K7031 Alcoholic cirrhosis of liver with ascites: Secondary | ICD-10-CM | POA: Diagnosis not present

## 2018-06-11 HISTORY — PX: IR PARACENTESIS: IMG2679

## 2018-06-11 LAB — BODY FLUID CELL COUNT WITH DIFFERENTIAL
LYMPHS FL: 17 %
Monocyte-Macrophage-Serous Fluid: 80 % (ref 50–90)
NEUTROPHIL FLUID: 3 % (ref 0–25)
WBC FLUID: 273 uL (ref 0–1000)

## 2018-06-11 MED ORDER — ALBUMIN HUMAN 25 % IV SOLN
INTRAVENOUS | Status: AC
  Filled 2018-06-11: qty 200

## 2018-06-11 MED ORDER — ALBUMIN HUMAN 25 % IV SOLN
50.0000 g | Freq: Once | INTRAVENOUS | Status: AC
  Administered 2018-06-11: 50 g via INTRAVENOUS

## 2018-06-11 NOTE — Telephone Encounter (Signed)
Patient called to let us know he has an appointment with Dr. Hamilton CapriZamor on Tuesday, 06/15/18 in [place].

## 2018-06-11 NOTE — Procedures (Signed)
PROCEDURE SUMMARY:  Successful US guided paracentesis from right lateral abdomen.  Yielded 6.2 liters of yellow fluid.  No immediate complications.  Pt tolerated well.   Specimen was sent for labs.  Hoyt KochKacie Sue-Ellen Matthews PA-C 06/11/2018 10:39 AM

## 2018-06-11 NOTE — Telephone Encounter (Signed)
Left message for patient that he should expect a call from Dr. Keane PoliceZamor's office to arrange appointment for next week. Faxed all information to [place] office [phone].

## 2018-06-14 ENCOUNTER — Telehealth: Payer: Self-pay | Admitting: Gastroenterology

## 2018-06-14 NOTE — Telephone Encounter (Signed)
Called patient back, left him a message that if he needed to contact [facility] ([facility]) that I have the number here for him to call them. Left number for patient on his voice mail.

## 2018-06-15 ENCOUNTER — Telehealth: Payer: Self-pay

## 2018-06-15 ENCOUNTER — Other Ambulatory Visit: Payer: Self-pay

## 2018-06-15 DIAGNOSIS — K7031 Alcoholic cirrhosis of liver with ascites: Secondary | ICD-10-CM

## 2018-06-15 DIAGNOSIS — Z95828 Presence of other vascular implants and grafts: Secondary | ICD-10-CM

## 2018-06-15 LAB — PATHOLOGIST SMEAR REVIEW

## 2018-06-15 NOTE — Telephone Encounter (Signed)
Spoke to patient this morning to discuss why he cancelled his appointment with Dr. Hamilton CapriZamor. Patient states "he was confused, it all happened so fast. Wanted to give the TIPS time to work." I again let him know that this was a very important consult, and we were very fortunate to get him an appointment so quickly. I asked him if he had plans to see any other hepatologist, he has no other plans to see someone else. He did confirm that he will follow up here in the office next week. I asked him if he still wants to be seen by Dr. Hamilton CapriZamor, he states he would like to think about it and let us know in a couple of days.

## 2018-06-15 NOTE — Telephone Encounter (Signed)
Patient will come do lab work on Thursday.

## 2018-06-15 NOTE — Telephone Encounter (Signed)
To be clear - my advice is that Kenneth [name] unquestionably needs to see this liver specialist.  Considerable effort went into making those arrangements. It is more than just an issue of the fluid/ascites- as stated in my note from last week, his liver function has worsened for unclear reasons.  I will see him in clinic as scheduled, but that appointment with Dr. Hamilton CapriZamor must be rescheduled immediately.  Also, please have him go to the lab tomorrow or Thursday for CMP and INR.

## 2018-06-16 MED FILL — FUROSEMIDE 40 MG TAB: 40 | 30 days supply | Qty: 30 | Fill #1

## 2018-06-16 MED FILL — LACTULOSE 10 GM/15 ML SOLN: 10 | 7 days supply | Qty: 473 | Fill #1

## 2018-06-16 MED FILL — FLUoxetine HCL 40 MG CAPS: 40 | 30 days supply | Qty: 30 | Fill #3

## 2018-06-17 ENCOUNTER — Telehealth: Payer: Self-pay | Admitting: Gastroenterology

## 2018-06-17 ENCOUNTER — Other Ambulatory Visit (INDEPENDENT_AMBULATORY_CARE_PROVIDER_SITE_OTHER): Payer: 59

## 2018-06-17 DIAGNOSIS — Z95828 Presence of other vascular implants and grafts: Secondary | ICD-10-CM | POA: Diagnosis not present

## 2018-06-17 DIAGNOSIS — K7031 Alcoholic cirrhosis of liver with ascites: Secondary | ICD-10-CM

## 2018-06-17 LAB — COMPREHENSIVE METABOLIC PANEL
ALBUMIN: 2.9 g/dL — AB (ref 3.5–5.2)
ALT: 40 U/L (ref 0–53)
AST: 67 U/L — AB (ref 0–37)
Alkaline Phosphatase: 153 U/L — ABNORMAL HIGH (ref 39–117)
BILIRUBIN TOTAL: 7.6 mg/dL — AB (ref 0.2–1.2)
BUN: 7 mg/dL (ref 6–23)
CALCIUM: 8.9 mg/dL (ref 8.4–10.5)
CHLORIDE: 103 meq/L (ref 96–112)
CO2: 30 mEq/L (ref 19–32)
CREATININE: 0.51 mg/dL (ref 0.40–1.50)
GFR: 177.94 mL/min (ref >60.00)
Glucose, Bld: 98 mg/dL (ref 70–99)
Potassium: 4.4 mEq/L (ref 3.5–5.1)
Sodium: 137 mEq/L (ref 135–145)
Total Protein: 6.2 g/dL (ref 6.0–8.3)

## 2018-06-17 LAB — PROTIME-INR
INR: 1.7 ratio — ABNORMAL HIGH (ref 0.8–1.0)
Prothrombin Time: 19.2 s — ABNORMAL HIGH (ref 9.6–13.1)

## 2018-06-17 NOTE — Telephone Encounter (Signed)
Thank you for tracking that all down.  It seems clear that Kenneth [name] has not involved his family in this complex situation, which has been an obstacle to optimal management.  I agree with all plans as outlined.

## 2018-06-17 NOTE — Telephone Encounter (Signed)
Spoke to patient again on 06/15/18, he wanted to wait on labs before deciding on appointment with Dr. Hamilton CapriZamor. He came to do lab work today, but had also said he would sign HCPOA so we could contact his sister, I did find one already in place with his sister, Kenneth [name] as Nurse, adultdesignated agent, phone # [phone].  I had received a phone message from Kenneth [name] at Dr. Keane PoliceZamor's office this morning, patient is confused about why he needs to go see him, he was concerned about how he was going to get there. Kenneth [name] let me know that Dr. Hamilton CapriZamor does have appointment slots available next week on 8/26, 8/27 and 8/30. I called patient's sister, explained what was going on and she had no idea that he even had an appointment this past Tuesday. She states she was just here visiting her brother this past weekend and could have easily brought him back to [place] and gone to that appointment. I have given her the phone number to hopefully reschedule the consult and she is going to discuss and help her brother with this decision. I also let his sister know that Dr. Myrtie Neitheranis is still his doctor, will still continue to see and follow up with him but that Dr. Myrtie Neitheranis is referring him to a liver specialist. She was aware of his follow up appointment here on 8/29 at 11:30, hopefully Kenneth [name] can be seen by Dr. Hamilton CapriZamor early next week and then follow up here on Thursday.

## 2018-06-17 NOTE — Telephone Encounter (Signed)
Spoke to [place], she had available appointments next week. I let her know I was going to contact his HCPOA, August LuzCatherine Coventry. See other phone note.

## 2018-06-21 ENCOUNTER — Telehealth: Payer: Self-pay

## 2018-06-21 ENCOUNTER — Telehealth: Payer: Self-pay | Admitting: Gastroenterology

## 2018-06-21 MED ORDER — AMILORIDE HCL 5 MG PO TABS: 5.0000 mg | ORAL_TABLET | Freq: Every day | ORAL | 1 refills | Status: DC

## 2018-06-21 NOTE — Telephone Encounter (Signed)
Incoming fax request from [facility]. Refill request for amiiloride 5 mg once a day. Pt is almost out due to taking BID as directed and now back to one a day.

## 2018-06-21 NOTE — Telephone Encounter (Signed)
I renewed it at once daily.  Phone note last week instructed to decrease it to once daily.  Give a month supply and a refill.  I am waiting for the consult note from the hepatologist in [place] that he saw today before I decide on the long term plan for this medicine.

## 2018-06-22 ENCOUNTER — Telehealth: Payer: Self-pay

## 2018-06-22 NOTE — Telephone Encounter (Signed)
Call received from Courtney with [facility] who confirmed that the patient has been certified for [facility] services. 

## 2018-06-24 ENCOUNTER — Other Ambulatory Visit (INDEPENDENT_AMBULATORY_CARE_PROVIDER_SITE_OTHER): Payer: 59

## 2018-06-24 ENCOUNTER — Encounter: Payer: Self-pay | Admitting: Gastroenterology

## 2018-06-24 ENCOUNTER — Ambulatory Visit (INDEPENDENT_AMBULATORY_CARE_PROVIDER_SITE_OTHER): Payer: 59 | Admitting: Gastroenterology

## 2018-06-24 VITALS — BP 100/60 | HR 72 | Ht 66.75 in | Wt 150.5 lb

## 2018-06-24 DIAGNOSIS — K7682 Hepatic encephalopathy: Secondary | ICD-10-CM

## 2018-06-24 DIAGNOSIS — K7290 Hepatic failure, unspecified without coma: Secondary | ICD-10-CM

## 2018-06-24 DIAGNOSIS — D689 Coagulation defect, unspecified: Secondary | ICD-10-CM

## 2018-06-24 DIAGNOSIS — Z95828 Presence of other vascular implants and grafts: Secondary | ICD-10-CM

## 2018-06-24 DIAGNOSIS — R600 Localized edema: Secondary | ICD-10-CM

## 2018-06-24 DIAGNOSIS — D696 Thrombocytopenia, unspecified: Secondary | ICD-10-CM

## 2018-06-24 DIAGNOSIS — R609 Edema, unspecified: Secondary | ICD-10-CM

## 2018-06-24 DIAGNOSIS — R945 Abnormal results of liver function studies: Secondary | ICD-10-CM

## 2018-06-24 DIAGNOSIS — K766 Portal hypertension: Secondary | ICD-10-CM

## 2018-06-24 DIAGNOSIS — K7031 Alcoholic cirrhosis of liver with ascites: Secondary | ICD-10-CM | POA: Diagnosis not present

## 2018-06-24 DIAGNOSIS — I8500 Esophageal varices without bleeding: Secondary | ICD-10-CM

## 2018-06-24 DIAGNOSIS — R7989 Other specified abnormal findings of blood chemistry: Secondary | ICD-10-CM

## 2018-06-24 LAB — COMPREHENSIVE METABOLIC PANEL
ALT: 45 U/L (ref 0–53)
AST: 79 U/L — AB (ref 0–37)
Albumin: 2.9 g/dL — ABNORMAL LOW (ref 3.5–5.2)
Alkaline Phosphatase: 177 U/L — ABNORMAL HIGH (ref 39–117)
BUN: 8 mg/dL (ref 6–23)
CHLORIDE: 105 meq/L (ref 96–112)
CO2: 30 mEq/L (ref 19–32)
CREATININE: 0.45 mg/dL (ref 0.40–1.50)
Calcium: 8.4 mg/dL (ref 8.4–10.5)
GFR: 205.58 mL/min (ref >60.00)
GLUCOSE: 100 mg/dL — AB (ref 70–99)
POTASSIUM: 4.1 meq/L (ref 3.5–5.1)
Sodium: 139 mEq/L (ref 135–145)
Total Bilirubin: 6.4 mg/dL — ABNORMAL HIGH (ref 0.2–1.2)
Total Protein: 6.5 g/dL (ref 6.0–8.3)

## 2018-06-24 LAB — CBC WITH DIFFERENTIAL/PLATELET
BASOS PCT: 0.9 % (ref 0.0–3.0)
Basophils Absolute: 0 10*3/uL (ref 0.0–0.1)
EOS PCT: 1 % (ref 0.0–5.0)
Eosinophils Absolute: 0.1 10*3/uL (ref 0.0–0.7)
HCT: 34.3 % — ABNORMAL LOW (ref 39.0–52.0)
Hemoglobin: 11.9 g/dL — ABNORMAL LOW (ref 13.0–17.0)
LYMPHS ABS: 0.7 10*3/uL (ref 0.7–4.0)
Lymphocytes Relative: 13.4 % (ref 12.0–46.0)
MCHC: 34.7 g/dL (ref 30.0–36.0)
MCV: 92.5 fl (ref 78.0–100.0)
MONO ABS: 0.8 10*3/uL (ref 0.1–1.0)
Monocytes Relative: 15.9 % — ABNORMAL HIGH (ref 3.0–12.0)
NEUTROS PCT: 68.8 % (ref 43.0–77.0)
Neutro Abs: 3.4 10*3/uL (ref 1.4–7.7)
PLATELETS: 78 10*3/uL — AB (ref 150.0–400.0)
RBC: 3.7 Mil/uL — ABNORMAL LOW (ref 4.22–5.81)
RDW: 22.5 % — AB (ref 11.5–15.5)
WBC: 5 10*3/uL (ref 4.0–10.5)

## 2018-06-24 LAB — PROTIME-INR
INR: 1.7 ratio — AB (ref 0.8–1.0)
PROTHROMBIN TIME: 19.7 s — AB (ref 9.6–13.1)

## 2018-06-24 MED ORDER — MIDODRINE HCL 2.5 MG PO TABS: 2.5000 mg | ORAL_TABLET | Freq: Three times a day (TID) | ORAL | 1 refills | Status: DC

## 2018-06-24 MED FILL — MIDODRINE HCL 2.5 MG TABLET: 2.5 | 30 days supply | Qty: 90 | Fill #0

## 2018-06-24 NOTE — Progress Notes (Signed)
[facility] Progress Note  Chief Complaint: Advanced cirrhosis and ascites  Subjective  History:  Kenneth [name] follows up with me for the first time since his TIPS placement.  He recently called with worsening ascites, and saw 1 of our PAs due to their availability.  I had reviewed the case in detail with them ahead of time.  He also reviewed the plan with me afterwards.  It is not clear how strictly he had been following sodium restriction and medication use.  He underwent paracentesis as described below.  Because his INR and bilirubin were rising, I also discussed his case at length with Dr. Grace [name] of interventional radiology and Dr. Baldemar FridayPhilippe [name], a transplant hepatologist [facility] [place].  I felt that because ascites was recurring after TIPS but more so because of worsening meld score for unclear reasons, I wanted Kenneth [name] to have a hematology/transplant consultation.  He was agreeable to doing so, extensive efforts were made to send records and set this up, and Dr. Hamilton [name] and to see him the week after I placed the call.  Unfortunately, Kenneth [name] canceled that appointment, and we called him about it afterwards, that he had just become frightened and was uncertain whether or not he should go.  It turns out his family was unaware he even had the appointment.  He was then given another chance to see Dr. Hamilton [name] earlier this week, and I have just learned from speaking to Dr Kenneth [name] that Kenneth [name] did not attend the appointment.  Kenneth [name] says he decided not to go to the visit in [place] because he felt sick and nauseated that day and just could not make the trip.  He later goes on to say "I do not want a liver transplant". His ascites has recurred after the recent paracentesis.  It is not clear how well he is followed sodium restriction, since he cannot recall daily sodium limit in milligrams, and was also under the impression it was mostly just not adding salt to his food.   It does not sound like he has been checking the sodium content of his foods.  It seems to me he has been overextended caring for his elderly mother with dementia, for which she gets some help from siblings.  Some of his siblings have been present on previous visits, but not today. He has abdominal distention and fullness that causes nausea.  He denies vomiting or dysphagia.  He denies upper GI bleeding or rectal bleeding. He is taking lactulose once a day but still struggling with constipation.  ROS: He denies chest pain. He has dyspnea laying flat and with exertion such as going upstairs. His mood is "okay" on Prozac.  The patient's Past Medical, Family and Social History were reviewed and are on file in the EMR.  Objective:  Med list reviewed  Current Outpatient Medications:  .  aMILoride (MIDAMOR) 5 MG tablet, Take 1 tablet (5 mg total) by mouth daily., Disp: 30 tablet, Rfl: 1 .  diclofenac sodium (VOLTAREN) 1 % GEL, Apply 4 g topically 4 (four) times daily., Disp: 100 g, Rfl: 0 .  FLUoxetine (PROZAC) 40 MG capsule, Take 1 capsule (40 mg total) by mouth daily., Disp: 30 capsule, Rfl: 3 .  furosemide (LASIX) 40 MG tablet, TAKE 1 TABLET BY MOUTH DAILY., Disp: 30 tablet, Rfl: 2 .  lactulose (CHRONULAC) 10 GM/15ML solution, Take 15 mLs (10 g total) by mouth 4 (four) times daily. (Patient taking differently: Take 10  g by mouth daily. ), Disp: 473 mL, Rfl: 3 .  Multiple Vitamin (MULTIVITAMIN WITH MINERALS) TABS tablet, Take 1 tablet by mouth as needed. , Disp: , Rfl:  .  ondansetron (ZOFRAN) 4 MG tablet, TAKE 1 TABLET BY MOUTH 2 TIMES DAILY AS NEEDED FOR NAUSEA OR VOMITING., Disp: 30 tablet, Rfl: 0 .  midodrine (PROAMATINE) 2.5 MG tablet, Take 1 tablet (2.5 mg total) by mouth 3 (three) times daily with meals., Disp: 90 tablet, Rfl: 1   Vital signs in last 24 hrs: Vitals:   06/24/18 1049  BP: 100/60  Pulse: 72    Physical Exam  Chronically ill-appearing man, diffuse muscle wasting.   Decreased affect, but good eye contact, pleasant and conversational.  He gets on exam table without assistance.  His gait is slow but steady.  No asterixis, good finger-to-nose test.  HEENT: sclera mildly icteric, oral mucosa moist without lesions  Neck: supple, no thyromegaly, JVD or lymphadenopathy  Cardiac: RRR without murmurs, S1S2 heard, 2+ peripheral edema on the shins, 3+ feet  Pulm: clear to auscultation bilaterally, normal RR and effort noted  Abdomen: soft, markedly distended with ascites, no tenderness, with active bowel sounds.  Liver edge palpable, no spleen tip palpable, somewhat limited by degree of ascites.  Umbilicus protuberant, but soft.  Skin; warm and dry, no jaundice or rash  Recent Labs:  CMP Latest Ref Rng & Units 06/17/2018 06/09/2018 06/02/2018  Glucose 70 - 99 mg/dL 98 77 161(W)  BUN 6 - 23 mg/dL 7 6 6   Creatinine 0.40 - 1.50 mg/dL 9.60 4.54 0.98  Sodium 135 - 145 mEq/L 137 137 135  Potassium 3.5 - 5.1 mEq/L 4.4 3.8 3.3(L)  Chloride 96 - 112 mEq/L 103 103 102  CO2 19 - 32 mEq/L 30 29 28   Calcium 8.4 - 10.5 mg/dL 8.9 1.1(B) 8.7  Total Protein 6.0 - 8.3 g/dL 6.2 6.2 5.9(L)  Total Bilirubin 0.2 - 1.2 mg/dL 7.6(H) 6.8(H) 5.3(H)  Alkaline Phos 39 - 117 U/L 153(H) 177(H) 187(H)  AST 0 - 37 U/L 67(H) 69(H) 97(H)  ALT 0 - 53 U/L 40 44 56(H)   INR: 1.52 on July 24, 1.9 on August 7, 1.8 on August 14, 1.7 on August 22  Radiologic studies: 6.2 L paracentesis with IV albumin administration on 06/11/2018   @ASSESSMENTPLANBEGIN @ Assessment: Encounter Diagnoses  Name Primary?  . Alcoholic cirrhosis of liver with ascites (HCC) Yes  . S/P TIPS (transjugular intrahepatic portosystemic shunt)   . Elevated LFTs   . Coagulopathy (HCC)   . Thrombocytopenia (HCC)   . Portal hypertension with esophageal varices (HCC)   . Peripheral edema   . Encephalopathy, hepatic [facility])    Kenneth [name] has end-stage liver disease originally caused by alcohol abuse, he has been abstinent  for quite some time by his report.  It is not clear why his liver disease decompensated, though it seems just to have been worsening hepatic function.  His pre-TIPS echocardiogram was normal.  There was no clot in the portal or mesenteric vein.  It seems that he did well initially after TIPS with significant improvement in ascites, but he has lately recurred just like it was before TIPS.  It is not clear that he has been compliant with sodium restriction, and he has clearly been noncompliant with recommendations to seek transplant hepatology evaluation.  At this point, I think that is not likely to be an option for him since he has demonstrated no interest in or engagement in that process.  I have been unable to increase his diuretic doses due to hypotension.  He is due to see interventional radiology on September 3 for ultrasound imaging/interpretation of the TIPS function.  We tried to arrange an ultrasound-guided paracentesis of 5 to 6 L with administration of 50 g of albumin for tomorrow, but there was no availability in radiology.  We will try to make those arrangements for September 3, the same day Jerimah is seeing radiology.  I will forward this note to Dr. Grace Isaac so he can review it prior to that visit.  I have also decided to start Midrin 2.5 mg by mouth 3 times daily which I hope will increase blood pressure enough that we might then be able to increase diuretic doses.  CBC, CMP and INR drawn today.  I am very concerned that Mr. Daleo's decompensated liver disease is worsening.  Since he is not currently interested in transplant evaluation, and probably would not be a candidate given his noncompliance thus far, he may need palliative care services if things continue to deteriorate.    Total time 45 minutes, over half spent face-to-face with patient in counseling and coordination of care.   Charlie Pitter III

## 2018-06-24 NOTE — Patient Instructions (Signed)
If you are age 57 or older, your body mass index should be between 23-30. Your Body mass index is 23.75 kg/m. If this is out of the aforementioned range listed, please consider follow up with your Primary Care Provider.  If you are age 57 or younger, your body mass index should be between 19-25. Your Body mass index is 23.75 kg/m. If this is out of the aformentioned range listed, please consider follow up with your Primary Care Provider.   You have been scheduled for an abdominal paracentesis on 06-29-2018 at [facility] at 9am . Please arrive at least 15 minutes prior to your appointment time for registration. Should you need to reschedule this appointment for any reason, please call our office at [phone].  One tablespoon of Lactulose 3 times a day.  Less than 2,000mg  of sodium a day.  It was a pleasure to see you today!  Dr. Myrtie Neitheranis

## 2018-06-25 LAB — AFP TUMOR MARKER: AFP-Tumor Marker: 1.7 ng/mL (ref <6.1)

## 2018-06-29 ENCOUNTER — Other Ambulatory Visit: Payer: Self-pay | Admitting: Radiology

## 2018-06-29 ENCOUNTER — Ambulatory Visit
Admission: RE | Admit: 2018-06-29 | Discharge: 2018-06-29 | Disposition: A | Discharge status: Home / Self Care | Payer: 59 | Source: Ambulatory Visit | Attending: Interventional Radiology | Admitting: Interventional Radiology

## 2018-06-29 ENCOUNTER — Ambulatory Visit
Admission: RE | Admit: 2018-06-29 | Discharge: 2018-06-29 | Disposition: A | Discharge status: Home / Self Care | Payer: 59 | Source: Ambulatory Visit | Attending: Radiology | Admitting: Radiology

## 2018-06-29 ENCOUNTER — Ambulatory Visit (HOSPITAL_COMMUNITY)
Admission: RE | Admit: 2018-06-29 | Discharge: 2018-06-29 | Disposition: A | Discharge status: Home / Self Care | Payer: 59 | Source: Ambulatory Visit | Attending: Gastroenterology | Admitting: Gastroenterology

## 2018-06-29 ENCOUNTER — Encounter: Payer: Self-pay | Admitting: Radiology

## 2018-06-29 DIAGNOSIS — K7031 Alcoholic cirrhosis of liver with ascites: Secondary | ICD-10-CM

## 2018-06-29 DIAGNOSIS — R188 Other ascites: Secondary | ICD-10-CM | POA: Insufficient documentation

## 2018-06-29 DIAGNOSIS — R609 Edema, unspecified: Secondary | ICD-10-CM

## 2018-06-29 DIAGNOSIS — Z95828 Presence of other vascular implants and grafts: Secondary | ICD-10-CM

## 2018-06-29 DIAGNOSIS — D689 Coagulation defect, unspecified: Secondary | ICD-10-CM

## 2018-06-29 DIAGNOSIS — R7989 Other specified abnormal findings of blood chemistry: Secondary | ICD-10-CM

## 2018-06-29 DIAGNOSIS — K7290 Hepatic failure, unspecified without coma: Secondary | ICD-10-CM

## 2018-06-29 DIAGNOSIS — K7460 Unspecified cirrhosis of liver: Secondary | ICD-10-CM | POA: Diagnosis not present

## 2018-06-29 DIAGNOSIS — K7682 Hepatic encephalopathy: Secondary | ICD-10-CM

## 2018-06-29 DIAGNOSIS — K766 Portal hypertension: Secondary | ICD-10-CM

## 2018-06-29 DIAGNOSIS — R945 Abnormal results of liver function studies: Secondary | ICD-10-CM

## 2018-06-29 DIAGNOSIS — I8500 Esophageal varices without bleeding: Secondary | ICD-10-CM

## 2018-06-29 DIAGNOSIS — D696 Thrombocytopenia, unspecified: Secondary | ICD-10-CM

## 2018-06-29 HISTORY — PX: IR PARACENTESIS: IMG2679

## 2018-06-29 HISTORY — PX: IR RADIOLOGIST EVAL & MGMT: IMG5224

## 2018-06-29 MED ORDER — LIDOCAINE HCL (PF) 2 % IJ SOLN
INTRAMUSCULAR | Status: AC
  Filled 2018-06-29: qty 20

## 2018-06-29 MED ORDER — ALBUMIN HUMAN 25 % IV SOLN
50.0000 g | Freq: Once | INTRAVENOUS | Status: AC
  Administered 2018-06-29: 50 g via INTRAVENOUS
  Filled 2018-06-29: qty 200

## 2018-06-29 MED ORDER — ALBUMIN HUMAN 25 % IV SOLN
INTRAVENOUS | Status: AC
  Filled 2018-06-29: qty 200

## 2018-06-29 MED ORDER — LIDOCAINE HCL (PF) 2 % IJ SOLN
INTRAMUSCULAR | Status: DC | PRN
  Administered 2018-06-29: 10 mL

## 2018-06-29 NOTE — Progress Notes (Signed)
Patient ID: Kenneth [name], male   DOB: 01-Apr-1961, 57 y.o.   MRN: [id number]        Chief Complaint: Post TIPS  Referring Physician(s): Danis  History of Present Illness: Kenneth [name] is a 57 y.o. male with past medical history significant for polysubstance abuse, alcoholic cirrhosis complicated by portal hypertension, portal hypertension gastroscopy and esophageal varices who underwent a successful ultrasound and fluoroscopic guided creation of a TIPS on 05/19/2018 for recurrent symptomatic ascites.  The patient returns today to the interventional radiology clinic for postprocedural evaluation and management.  He is accompanied by his son though serves as his own historian.  While the patient recovered from the TIPS creation without incident and was discharged the subsequent day, he unfortunately has experienced an otherwise rocky postoperative course including requiring 2 subsequent large-volume paracenteses with 6.2 L drained on 06/11/2018 and additional 6 L drained earlier today.    Additionally, the patient has experienced a worsening of his bilirubin level since undergoing the TIPS procedure though most recent laboratories obtained 06/24/2018 suggested slight improvement in his bilirubin level improving from 7.6 on 8/22 to 6.4 on 8/29.  The patient continues to report abstinence from alcohol.  Patient denies altered mental status since undergoing the TIPS creation.    Patient reports discomfort with the recurrent ascites but denies worsening abdominal pain.  He denies bloody or melanotic stools.  No vomiting or hematemesis.  Patient states he has been compliant with prescribed course of medications though his compliance with low-salt diet is uncertain.  Patient states he is not interested in undergoing consultation for transplant listing as he has "lived a full life."  Past Medical History:  Diagnosis Date  . Alcohol abuse   . Anxiety   . Arthritis    "never diagnosis"  . Blood  clot in vein   . Cirrhosis (HCC)   . Depression   . Dyspnea    Due to ascites  . Esophageal varices (HCC) 09/02/2014  . H/O [facility] spotted fever    age 76 or 48  . Hernia, inguinal    right  . Hiatal hernia 09/02/2014  . Polysubstance abuse (HCC)   . Portal hypertension with esophageal varices (HCC) 09/02/2014  . Portal hypertensive gastropathy (HCC) 09/02/2014  . Thrombocytopenia (HCC) 09/02/2014  . Upper GI bleed 08/31/2014    Past Surgical History:  Procedure Laterality Date  . ESOPHAGOGASTRODUODENOSCOPY Left 09/01/2014   Procedure: ESOPHAGOGASTRODUODENOSCOPY (EGD);  Surgeon: Charna Elizabeth, MD;  Location: [facility];  Service: Endoscopy;  Laterality: Left;  . EYE SURGERY Left    age 42  . IR PARACENTESIS  03/16/2018  . IR PARACENTESIS  03/29/2018  . IR PARACENTESIS  04/19/2018  . IR PARACENTESIS  04/27/2018  . IR PARACENTESIS  05/10/2018  . IR PARACENTESIS  05/19/2018  . IR PARACENTESIS  06/11/2018  . IR RADIOLOGIST EVAL & MGMT  05/13/2018  . IR RADIOLOGIST EVAL & MGMT  06/29/2018  . IR TIPS  05/19/2018  . RADIOLOGY WITH ANESTHESIA N/A 05/19/2018   Procedure: TIPS;  Surgeon: Simonne Come, MD;  Location: [facility] OR;  Service: Radiology;  Laterality: N/A;    Allergies: Latex  Medications: Prior to Admission medications   Medication Sig Start Date End Date Taking? Authorizing Provider  aMILoride (MIDAMOR) 5 MG tablet Take 1 tablet (5 mg total) by mouth daily. 06/21/18   Charlie Pitter III, MD  diclofenac sodium (VOLTAREN) 1 % GEL Apply 4 g topically 4 (four) times daily. 12/15/17   Newlin,  Enobong, MD  FLUoxetine (PROZAC) 40 MG capsule Take 1 capsule (40 mg total) by mouth daily. 04/28/18   [name], MD  furosemide (LASIX) 40 MG tablet TAKE 1 TABLET BY MOUTH DAILY. 05/17/18   [name], MD  lactulose (CHRONULAC) 10 GM/15ML solution Take 15 mLs (10 g total) by mouth 4 (four) times daily. Patient taking differently: Take 10 g by mouth daily.  06/04/18   Sherrilyn Rist, MD    midodrine (PROAMATINE) 2.5 MG tablet Take 1 tablet (2.5 mg total) by mouth 3 (three) times daily with meals. 06/24/18   Sherrilyn Rist, MD  Multiple Vitamin (MULTIVITAMIN WITH MINERALS) TABS tablet Take 1 tablet by mouth as needed.     [provider]  ondansetron (ZOFRAN) 4 MG tablet TAKE 1 TABLET BY MOUTH 2 TIMES DAILY AS NEEDED FOR NAUSEA OR VOMITING. 04/19/18   Marcine Matar, MD     Family History  Problem Relation Age of Onset  . Leukemia Father        acute myloid   . Hyperlipidemia Father   . Hypertension Father   . Colon cancer Neg Hx   . Esophageal cancer Neg Hx   . Pancreatic cancer Neg Hx   . Stomach cancer Neg Hx   . Liver disease Neg Hx     Social History   Socioeconomic History  . Marital status: Single    Spouse name: Not on file  . Number of children: 1  . Years of education: Not on file  . Highest education level: Not on file  Occupational History  . Occupation: Administrator  Social Needs  . Financial resource strain: Not on file  . Food insecurity:    Worry: Not on file    Inability: Not on file  . Transportation needs:    Medical: Not on file    Non-medical: Not on file  Tobacco Use  . Smoking status: Current Every Day Smoker    Packs/day: 0.50    Years: 10.00    Pack years: 5.00    Types: Cigarettes  . Smokeless tobacco: Never Used  Substance and Sexual Activity  . Alcohol use: No    Comment: no alcohol since 01/23/16  . Drug use: Yes    Frequency: 5.0 times per week    Types: Marijuana  . Sexual activity: Not on file  Lifestyle  . Physical activity:    Days per week: Not on file    Minutes per session: Not on file  . Stress: Not on file  Relationships  . Social connections:    Talks on phone: Not on file    Gets together: Not on file    Attends religious service: Not on file    Active member of club or organization: Not on file    Attends meetings of clubs or organizations: Not on file    Relationship status: Not on  file  Other Topics Concern  . Not on file  Social History Narrative  . Not on file    ECOG Status: 1 - Symptomatic but completely ambulatory  Review of Systems: A 12 point ROS discussed and pertinent positives are indicated in the HPI above.  All other systems are negative.  Review of Systems  Constitutional: Negative for activity change, appetite change, fatigue and fever.  HENT:       Patient reports a minimal amount of scleral icterus  Gastrointestinal: Positive for abdominal distention. Negative for blood in stool and constipation.  Patient admits to occasional nausea, worse following ingestion of a salty meal  Skin: Positive for color change.  Hematological: Bruises/bleeds easily.  Psychiatric/Behavioral:       No change in patient's baseline mental status with occasional episodes of forgetfulness per    Vital Signs: BP (!) 94/59 (BP Location: Right Arm, Patient Position: Sitting, Cuff Size: Normal)   Pulse 70   Temp 98.3 F (36.8 C)   Resp 16   SpO2 100%   Physical Exam  Constitutional:  Slightly cachectic appearing  Eyes:  Mild scleral icterus  Abdominal: He exhibits distension.  Distended abdomen with small periumbilical hernia. Dressing from paracentesis access site with the right mid hemiabdomen.  Skin:  Mild centripetal jaundice.  Psychiatric: He has a normal mood and affect. His behavior is normal. Thought content normal.  Nursing note and vitals reviewed.    Imaging: US Abdominal Pelvic Art/vent Flow Doppler  Result Date: 06/29/2018 CLINICAL DATA:  History of alcoholic cirrhosis with recurrent symptomatic ascites post creation of a TIPS on 05/19/2018. Unfortunately, the patient has required 2 large volume paracentesis following creation of the TIPS (yielding 6.2 L on 06/11/2018 and approximately 6 L earlier today). Patient returns today to the interventional radiology clinic for postprocedural evaluation and TIPS Doppler ultrasound. EXAM: DUPLEX  ULTRASOUND OF LIVER AND TIPS SHUNT TECHNIQUE: Color and duplex Doppler ultrasound was performed to evaluate the hepatic in-flow and out-flow vessels. COMPARISON:  B RT 0 protocol CT scan-04/26/2018; ultrasound fluoroscopic guided TIPS creation-05/19/2018; ultrasound-guided paracentesis-earlier same day; 06/11/2018 FINDINGS: Portal Vein Velocities Main:  54 cm/sec Right:  132 cm/sec Left:  52 cm/sec TIPS Stent Velocities Proximal:  132 cm/sec Mid: 180 cm/sec Distal:  105 cm/sec IVC: Present and patent with normal respiratory phasicity. Hepatic Vein Velocities Right:  105 cm/sec Mid:  26 cm/sec Left:  34 cm/sec Splenic Vein: 44 centimeters/second Superior Mesenteric Vein: 26 centimeters/second Hepatic Artery: 140 centimeters/seconds Ascites: Small to moderate volume residual intra-abdominal ascites Varices: None visualized Other findings: Re- demonstrated splenomegaly with the spleen measuring 14.3 cm in length. IMPRESSION: 1. Widely patent TIPS with normal velocities and directional flow. 2. Small to moderate volume residual intra-abdominal ascites. Electronically Signed   By: Simonne Come M.D.   On: 06/29/2018 12:48   Ir Radiologist Eval & Mgmt  Result Date: 06/29/2018 Please refer to notes tab for details about interventional procedure. (Op Note)  Ir Paracentesis  Result Date: 06/11/2018 INDICATION: Patient with history of alcoholic cirrhosis, recurrent ascites. Patient status post TIPS procedure 05/19/2018. Presents with abdominal distention, ascites today. Request is made for diagnostic and therapeutic paracentesis of 8 L maximum. Patient to receive albumin post procedure. EXAM: ULTRASOUND GUIDED DIAGNOSTIC AND THERAPEUTIC PARACENTESIS MEDICATIONS: 10 mL 2% lidocaine COMPLICATIONS: None immediate. PROCEDURE: Informed written consent was obtained from the patient after a discussion of the risks, benefits and alternatives to treatment. A timeout was performed prior to the initiation of the procedure. Initial  ultrasound scanning demonstrates a large amount of ascites within the right lower abdominal quadrant. The right lower abdomen was prepped and draped in the usual sterile fashion. 2% lidocaine was used for local anesthesia. Following this, a 19 gauge, 7-cm, Yueh catheter was introduced. An ultrasound image was saved for documentation purposes. The paracentesis was performed. The catheter was removed and a dressing was applied. The patient tolerated the procedure well without immediate post procedural complication. FINDINGS: A total of approximately 6.2 liters of yellow fluid was removed. Samples were sent to the laboratory as requested by the clinical  team. IMPRESSION: Successful ultrasound-guided diagnostic and therapeutic paracentesis yielding 6.2 liters of peritoneal fluid. Read by: Loyce Dys PA-C Electronically Signed   By: Richarda Overlie M.D.   On: 06/11/2018 10:51    Labs:  CBC: Recent Labs    04/16/18 1558 05/19/18 0636 06/02/18 1553 06/24/18 1225  WBC 5.7 5.8 5.2 5.0  HGB 15.1 14.7 14.2 11.9*  HCT 44.4 42.4 39.9 34.3*  PLT 91.0* 101* 73.0* 78.0*    COAGS: Recent Labs    02/26/18 1605  03/26/18 1346  06/02/18 1553 06/09/18 1359 06/17/18 0924 06/24/18 1225  INR 1.42   < > 1.4*   < > 1.9* 1.8* 1.7* 1.7*  APTT 37*  --  35*  --   --   --   --   --    < > = values in this interval not displayed.    BMP: Recent Labs    02/26/18 1605 03/15/18 1421  04/28/18 1042 05/19/18 0636 06/02/18 1553 06/09/18 1359 06/17/18 0924 06/24/18 1225  NA 139 139   < > 138 130* 135 137 137 139  K 3.6 3.9   < > 4.2 4.2 3.3* 3.8 4.4 4.1  CL 105 102   < > 101 99 102 103 103 105  CO2 25 24   < > 25 22 28 29 30 30   GLUCOSE 87 84   < > 78 82 102* 77 98 100*  BUN 6 6   < > 9 7 6 6 7 8   CALCIUM 8.4* 8.2*   < > 8.6* 8.7* 8.7 8.2* 8.9 8.4  CREATININE 0.68 0.73*   < > 0.67* 0.64 0.57 0.50 0.51 0.45  GFRNONAA >60 104  --  107 >60  --   --   --   --   GFRAA >60 120  --  123 >60  --   --   --   --      < > = values in this interval not displayed.    LIVER FUNCTION TESTS: Recent Labs    06/02/18 1553 06/09/18 1359 06/17/18 0924 06/24/18 1225  BILITOT 5.3* 6.8* 7.6* 6.4*  AST 97* 69* 67* 79*  ALT 56* 44 40 45  ALKPHOS 187* 177* 153* 177*  PROT 5.9* 6.2 6.2 6.5  ALBUMIN 2.7* 2.6* 2.9* 2.9*    TUMOR MARKERS: Recent Labs    04/16/18 1558 06/24/18 1225  AFPTM 2.0 1.7    Assessment and Plan:  SANTOS SOLLENBERGER is a 56 y.o. male with past medical history significant for polysubstance abuse, alcoholic cirrhosis complicated by portal hypertension, portal hypertension gastroscopy and esophageal varices who underwent a successful ultrasound and fluoroscopic guided creation of a TIPS on 05/19/2018 for recurrent symptomatic ascites.    Unfortunately, the patient has experienced recurrence of symptomatic ascites requiring 2 large-volume paracenteses (6.2 L drained on 06/11/2018 and additional 6 L drained earlier today).    Additionally, the patient has experienced worsening of his bilirubin level though most recent laboratories obtained on 06/24/2018 suggested improvement from a bilirubin peak of 7.6 on 06/17/2018 to 6.4 on 06/24/2018.  Note, on 7/24, the day of the TIPS, the patient's calculated Na -MELD score was 21 and his calculated Na-MELD meld score from most recent laboratories obtained 8/29 was 20, however his bilirubin was 2.8 on the day of the procedure and was most recently 6.4.  TIPS Doppler ultrasound performed prior to today's appointment demonstrates a widely patent TIPS with normal directional flow and velocities.  PLAN:  -  Will obtain repeat CMP, INR and CBC mid next week - hope for continued improvement in his Na-MELD score - Compliance with prescribed medication regimen and low-salt diet again stressed to the patient who demonstrated fair understanding. - Importance of continued abstinence stressed to the patient. - Recommend ultrasound-guided paracentesis as indicated  however I am hopeful that with continued improvement in his LFTs as well as stabilization of his medication regiment compliance with low-salt diet the time interval prior to requiring large volume paracentesis may be extended.  Note, the TIPS was only angioplastied to 8 mm diameter at the time of placement (given concern for pt's post procedural mental status), however TIPS revision could be performed with TIPS angioplasty to 10 mm in hopes of further reduction of the portosystemic gradient, however I am leery to perform this procedure until his LFTs have normalized.   - Patient confirmed he is not interested in undergoing transplant evaluation  Ultimately, if patient were to experience either worsening of his liver function tests and/or persistent large volume ascites would recommend proceeding with BRTO protocol CT scan for further evaluation.    A copy of this report was sent to the requesting provider on this date.  Electronically Signed: Simonne Come 06/29/2018, 12:50 PM   I spent a total of 25 Minutes in face to face in clinical consultation, greater than 50% of which was counseling/coordinating care for post TIPS

## 2018-07-02 MED FILL — LACTULOSE 10 GM/15 ML SOLN: 10 | 7 days supply | Qty: 473 | Fill #2

## 2018-07-05 MED FILL — aMILoride HCL 5 MG TABS: 5 | 30 days supply | Qty: 30 | Fill #0

## 2018-07-06 ENCOUNTER — Other Ambulatory Visit (INDEPENDENT_AMBULATORY_CARE_PROVIDER_SITE_OTHER): Payer: 59

## 2018-07-06 ENCOUNTER — Ambulatory Visit (INDEPENDENT_AMBULATORY_CARE_PROVIDER_SITE_OTHER): Payer: 59 | Admitting: Physician Assistant

## 2018-07-06 ENCOUNTER — Encounter: Payer: Self-pay | Admitting: Physician Assistant

## 2018-07-06 ENCOUNTER — Other Ambulatory Visit: Payer: Self-pay

## 2018-07-06 VITALS — BP 84/60 | HR 76 | Ht 66.75 in | Wt 150.1 lb

## 2018-07-06 DIAGNOSIS — K7290 Hepatic failure, unspecified without coma: Secondary | ICD-10-CM

## 2018-07-06 DIAGNOSIS — R188 Other ascites: Secondary | ICD-10-CM | POA: Diagnosis not present

## 2018-07-06 DIAGNOSIS — R5383 Other fatigue: Secondary | ICD-10-CM

## 2018-07-06 DIAGNOSIS — K7031 Alcoholic cirrhosis of liver with ascites: Secondary | ICD-10-CM

## 2018-07-06 DIAGNOSIS — K7460 Unspecified cirrhosis of liver: Secondary | ICD-10-CM

## 2018-07-06 DIAGNOSIS — G9340 Encephalopathy, unspecified: Secondary | ICD-10-CM

## 2018-07-06 DIAGNOSIS — R410 Disorientation, unspecified: Secondary | ICD-10-CM

## 2018-07-06 LAB — COMPREHENSIVE METABOLIC PANEL
ALBUMIN: 2.9 g/dL — AB (ref 3.5–5.2)
ALK PHOS: 163 U/L — AB (ref 39–117)
ALT: 37 U/L (ref 0–53)
AST: 65 U/L — AB (ref 0–37)
BILIRUBIN TOTAL: 6.4 mg/dL — AB (ref 0.2–1.2)
BUN: 5 mg/dL — ABNORMAL LOW (ref 6–23)
CALCIUM: 8.3 mg/dL — AB (ref 8.4–10.5)
CO2: 28 meq/L (ref 19–32)
CREATININE: 0.67 mg/dL (ref 0.40–1.50)
Chloride: 105 mEq/L (ref 96–112)
GFR: 129.85 mL/min (ref >60.00)
Glucose, Bld: 95 mg/dL (ref 70–99)
Potassium: 4.2 mEq/L (ref 3.5–5.1)
Sodium: 139 mEq/L (ref 135–145)
TOTAL PROTEIN: 6.2 g/dL (ref 6.0–8.3)

## 2018-07-06 LAB — CBC WITH DIFFERENTIAL/PLATELET
BASOS ABS: 0 10*3/uL (ref 0.0–0.1)
Basophils Relative: 0.9 % (ref 0.0–3.0)
EOS ABS: 0.1 10*3/uL (ref 0.0–0.7)
Eosinophils Relative: 2.3 % (ref 0.0–5.0)
HEMATOCRIT: 35.2 % — AB (ref 39.0–52.0)
HEMOGLOBIN: 12.3 g/dL — AB (ref 13.0–17.0)
LYMPHS PCT: 13.4 % (ref 12.0–46.0)
Lymphs Abs: 0.6 10*3/uL — ABNORMAL LOW (ref 0.7–4.0)
MCHC: 34.9 g/dL (ref 30.0–36.0)
MCV: 94.8 fl (ref 78.0–100.0)
Monocytes Absolute: 1 10*3/uL (ref 0.1–1.0)
Monocytes Relative: 20.5 % — ABNORMAL HIGH (ref 3.0–12.0)
Neutro Abs: 3 10*3/uL (ref 1.4–7.7)
Neutrophils Relative %: 63 % (ref 43.0–77.0)
Platelets: 83 10*3/uL — ABNORMAL LOW (ref 150.0–400.0)
RBC: 3.72 Mil/uL — AB (ref 4.22–5.81)
RDW: 19.3 % — ABNORMAL HIGH (ref 11.5–15.5)
WBC: 4.8 10*3/uL (ref 4.0–10.5)

## 2018-07-06 LAB — PROTIME-INR
INR: 1.7 ratio — AB (ref 0.8–1.0)
PROTHROMBIN TIME: 19.8 s — AB (ref 9.6–13.1)

## 2018-07-06 LAB — AMMONIA: Ammonia: 58 umol/L — ABNORMAL HIGH (ref 11–35)

## 2018-07-06 MED ORDER — MIDODRINE HCL 5 MG PO TABS: 5.0000 mg | ORAL_TABLET | Freq: Three times a day (TID) | ORAL | 1 refills | Status: DC

## 2018-07-06 MED ORDER — FUROSEMIDE 40 MG PO TABS: 60.0000 mg | ORAL_TABLET | Freq: Every day | ORAL | 2 refills | Status: DC

## 2018-07-06 MED ORDER — LACTULOSE 10 GM/15ML PO SOLN: ORAL | 3 refills | Status: DC

## 2018-07-06 MED FILL — LACTULOSE 10 GM/15 ML SOLN: 10 | 3 days supply | Qty: 473 | Fill #0

## 2018-07-06 MED FILL — FUROSEMIDE 40 MG TAB: 40 | 30 days supply | Qty: 45 | Fill #0

## 2018-07-06 MED FILL — MIDODRINE HCL 5 MG TABLET: 5 | 10 days supply | Qty: 30 | Fill #0

## 2018-07-06 NOTE — Progress Notes (Signed)
Subjective:    Patient ID: Kenneth [name], male    DOB: [birth year], 57 y.o.   MRN: [id number]  HPI [name] is a 57 year old white male, known to Dr. Myrtie Neither with decompensated alcohol-related cirrhosis located by esophageal varices, ascites prior history of SBP, and also with history of depression. Patient underwent TIPS on 05/24/2018 for management of ascites which has been difficult due to relative hypotension.  Unfortunately post TIPS he had reaccumulation of ascites within a few days of the procedure.  TIPS has been investigated and patent.  He was evaluated by IR as recently as 06/29/2018 with Doppler ultrasound showing the TIPS to be widely patent. He underwent her centesis with removal of 6 L of fluid that same day.  He also had paracentesis on August 16 with removal of 6.2 L.  Cell counts not consistent with SBP. He has been on Lasix 40 mg p.o. every morning and amiloride 5 mg p.o. every morning.  He was recently started on Midodrine 2.5 mg p.o. 3 times daily to boost his blood pressure. He comes in today for follow-up.  His weight is stable at 150 pounds.  He continues to have lower extremity edema to the knees which is uncomfortable.  He is not complaining any of any abdominal discomfort today or shortness of breath.  He says he has been trying hard to follow a 2 g sodium diet and has been taking his medications as directed. On arrival today blood pressure was 84/60, on repeat 108/68 and patient then said he had not taken Midodrine  Today. He has been eating okay, does have some intermittent nausea, very occasional vomiting.  He says he has not been sleeping well and gets up every couple of hours to urinate.  His son says he also falls asleep frequently during the day, and when directly asked says that he does get confused on occasion. Patient says he is exhausted from taking care of his 15 year old mother who has dementia.  He splits the care with his brother , they  each spend 70 to 80 hours a  week with her she is unable to care for herself. He has been taking lactulose 2 tablespoons twice daily but says sometimes he is actually constipated and if he has a bowel movements generally just once or twice a day.   I asked him again about liver transplantation.  He still is does not feel that he would want to go through a liver transplant.  He says he has lived his life, would only be doing a transplant for his son.   Review of Systems Pertinent positive and negative review of systems were noted in the above HPI section.  All other review of systems was otherwise negative.  Outpatient Encounter Medications as of 07/06/2018  Medication Sig  . aMILoride (MIDAMOR) 5 MG tablet Take 1 tablet (5 mg total) by mouth daily.  . diclofenac sodium (VOLTAREN) 1 % GEL Apply 4 g topically 4 (four) times daily.  Marland Kitchen FLUoxetine (PROZAC) 40 MG capsule Take 1 capsule (40 mg total) by mouth daily.  . furosemide (LASIX) 40 MG tablet Take 1.5 tablets (60 mg total) by mouth daily.  Marland Kitchen lactulose (CHRONULAC) 10 GM/15ML solution Increase to 4 tablespoons by mouth twice daily.  . Multiple Vitamin (MULTIVITAMIN WITH MINERALS) TABS tablet Take 1 tablet by mouth as needed.   . ondansetron (ZOFRAN) 4 MG tablet TAKE 1 TABLET BY MOUTH 2 TIMES DAILY AS NEEDED FOR NAUSEA OR VOMITING.  . [DISCONTINUED] furosemide (  LASIX) 40 MG tablet TAKE 1 TABLET BY MOUTH DAILY.  . [DISCONTINUED] lactulose (CHRONULAC) 10 GM/15ML solution Take 15 mLs (10 g total) by mouth 4 (four) times daily. (Patient taking differently: Take 10 g by mouth 3 (three) times daily. )  . [DISCONTINUED] midodrine (PROAMATINE) 2.5 MG tablet Take 1 tablet (2.5 mg total) by mouth 3 (three) times daily with meals.   No facility-administered encounter medications on file as of 07/06/2018.    Allergies  Allergen Reactions  . Latex Swelling and Rash   Patient Active Problem List   Diagnosis Date Noted  . Ascites due to alcoholic cirrhosis (HCC) 05/19/2018  . Depression  12/18/2016  . Testosterone deficiency in male 08/11/2016  . Inguinal hernia 02/28/2016  . Rectal bleed 01/24/2016  . Umbilical hernia 01/24/2016  . Ascites 01/24/2016  . SBP (spontaneous bacterial peritonitis) (HCC) 01/24/2016  . Tobacco abuse 01/24/2016  . Gynecomastia, male 01/24/2016  . Abdominal pain, generalized   . Esophageal varices (HCC) 09/02/2014  . Portal hypertension with esophageal varices (HCC) 09/02/2014  . Portal hypertensive gastropathy (HCC) 09/02/2014  . Hiatal hernia 09/02/2014  . Acute blood loss anemia 09/02/2014  . Leukopenia 09/02/2014  . Thrombocytopenia (HCC) 09/02/2014  . Alcoholic cirrhosis of liver with ascites (HCC)   . Alcohol abuse 08/31/2014   Social History   Socioeconomic History  . Marital status: Single    Spouse name: Not on file  . Number of children: 1  . Years of education: Not on file  . Highest education level: Not on file  Occupational History  . Occupation: Administrator  Social Needs  . Financial resource strain: Not on file  . Food insecurity:    Worry: Not on file    Inability: Not on file  . Transportation needs:    Medical: Not on file    Non-medical: Not on file  Tobacco Use  . Smoking status: Current Every Day Smoker    Packs/day: 0.50    Years: 10.00    Pack years: 5.00    Types: Cigarettes  . Smokeless tobacco: Never Used  Substance and Sexual Activity  . Alcohol use: No    Comment: no alcohol since 01/23/16  . Drug use: Yes    Frequency: 5.0 times per week    Types: Marijuana  . Sexual activity: Not on file  Lifestyle  . Physical activity:    Days per week: Not on file    Minutes per session: Not on file  . Stress: Not on file  Relationships  . Social connections:    Talks on phone: Not on file    Gets together: Not on file    Attends religious service: Not on file    Active member of club or organization: Not on file    Attends meetings of clubs or organizations: Not on file    Relationship status: Not  on file  . Intimate partner violence:    Fear of current or ex partner: Not on file    Emotionally abused: Not on file    Physically abused: Not on file    Forced sexual activity: Not on file  Other Topics Concern  . Not on file  Social History Narrative  . Not on file    Mr. Kareem's family history includes Hyperlipidemia in his father; Hypertension in his father; Leukemia in his father.      Objective:    Vitals:   07/06/18 0937  BP: (!) 84/60  Pulse: 76    Physical  Exam; developed white male in no acute distress, chronically ill-appearing accompanied by his son blood pressure initially 84/60 on repeat 108/68 pulse 76 weight 150, height 5 foot 6, BMI 23.6.  HEENT; nontraumatic normocephalic EOMI PERRLA sclera with early icterus, oral mucosa moist, Cardiovascular; regular rate and rhythm with S1-S2, Pulmonary; clear bilaterally, Abdomen ;non-tense ascites, nontender no palpable mass or hepatosplenomegaly sounds are present small umbilical hernia, Rectal ;exam not done, Extremities; 2+ edema to the knees bilaterally and warm and dry,  Neuro psych ;alert, cooperative oriented x3 , mentation a bit slow no asterixis       Assessment & Plan:   #1  57 yo white male with severely decompensated alcoholic cirrhosis who has had persistence of ascites and peripheral edema despite TIPS 05/24/2018.  TIPS is widely patent on Doppler ultrasound done 06/29/2018 Patient continues to decline referral for liver transplant.  #2  severe fatigue- multifactorial #3  hepatic encephalopathy #4  hypotension-affecting ability to increase doses of diuretics Patient is now on low-dose mid a drain 2.5 ,3 times daily, and perhaps has had some improvement in blood pressure  Plan; CBC, CMET, venous ammonia Continue 2 g sodium diet with strict adherence Increase lactulose to 4 tablespoons twice daily goal of at least 3 loose bowel movements per day Increase midodrine to 5 mg p.o. 3 times daily Increase Lasix  from 40 mg to 60 mg p.o. every morning I will plan to see patient back in the office in 2 weeks, and we have also made him follow-up with Dr. Myrtie Neither  in  October  Wayne Both Makya Phillis PA-C 07/06/2018   Cc: [name], MD

## 2018-07-06 NOTE — Progress Notes (Signed)
Thank you for sending this case to me, and for discussing it in clinic earlier today I have reviewed the entire note, and the outlined plan is what we discussed.  We have about reached the limits of what we can do to help this condition, considering social issues and compliance with treatment recommendations.  Increased diuretic dosing will be limited by relative hypotension.  Amada Jupiter, MD

## 2018-07-06 NOTE — Patient Instructions (Addendum)
Your provider has requested that you go to the basement level for lab work before leaving today. Press "B" on the elevator. The lab is located at the first door on the left as you exit the elevator. Increase Lactulose to 4 tablespoons twice daily. Continue Midodrine 2.5 mg , take 3 times daily Increase Lasix to 60 mg by mouth daily . Continue Amiloride 5 mg daily.   Follow up with Mike Gip PA on 07-21-2018 at 11:00 am. Follow up with Dr. Amada Jupiter on 08-11-2018 at 1:30 PM.  Normal BMI (Body Mass Index- based on height and weight) is between 19 and 25. Your BMI today is Body mass index is 23.69 kg/m. Marland Kitchen Please consider follow up  regarding your BMI with your Primary Care Provider.

## 2018-07-13 ENCOUNTER — Telehealth: Payer: Self-pay | Admitting: Physician Assistant

## 2018-07-13 MED FILL — FLUoxetine HCL 40 MG CAPS: 40 | 30 days supply | Qty: 30 | Fill #0

## 2018-07-13 NOTE — Telephone Encounter (Signed)
Last seen 07/06/18. He calls today with complaints of increased heaviness and swelling of his legs. States his abdomen is more swollen. Denies SOB. States he is compliant with his medication except the Lactulose. He "cut it in half" due to nausea. States it doesn't make him "throw up, but makes me feel nauseated." Having 1 bowel movement daily.  Please advise.

## 2018-07-14 ENCOUNTER — Other Ambulatory Visit: Payer: Self-pay

## 2018-07-14 ENCOUNTER — Telehealth: Payer: Self-pay

## 2018-07-14 DIAGNOSIS — K7290 Hepatic failure, unspecified without coma: Secondary | ICD-10-CM

## 2018-07-14 DIAGNOSIS — K7460 Unspecified cirrhosis of liver: Secondary | ICD-10-CM

## 2018-07-14 DIAGNOSIS — K7031 Alcoholic cirrhosis of liver with ascites: Secondary | ICD-10-CM

## 2018-07-14 NOTE — Telephone Encounter (Signed)
Ok - set him up for large volume paracentesis this week - remove up to 7 liters, don't need cell counts, and order IV Albumin 25% -  75 gm    Thanks.     Dr Myrtie Neitheranis -Lorain ChildesFYI.. May need to set him up for scheduled paracentesis q  2weeks  ?

## 2018-07-14 NOTE — Telephone Encounter (Signed)
Left message for the patient to call back.

## 2018-07-14 NOTE — Telephone Encounter (Signed)
Orders in and scheduled for 07/16/18 with albumin per radiology protocol.

## 2018-07-14 NOTE — Telephone Encounter (Signed)
See other telephone note. Patient advised to hold Lasix and Midamor on the day of his paracentesis.

## 2018-07-14 NOTE — Telephone Encounter (Signed)
50 grams of IV albumin will be sufficient, even if up to 7 liters removed.  Do not take amiloride or furosemide the day or paracentesis.

## 2018-07-14 NOTE — Telephone Encounter (Signed)
Patient is advised.  

## 2018-07-16 ENCOUNTER — Ambulatory Visit (HOSPITAL_COMMUNITY)
Admission: RE | Admit: 2018-07-16 | Discharge: 2018-07-16 | Disposition: A | Discharge status: Home / Self Care | Payer: 59 | Source: Ambulatory Visit | Attending: Physician Assistant | Admitting: Physician Assistant

## 2018-07-16 ENCOUNTER — Encounter (HOSPITAL_COMMUNITY): Payer: Self-pay | Admitting: Student

## 2018-07-16 DIAGNOSIS — K7290 Hepatic failure, unspecified without coma: Secondary | ICD-10-CM

## 2018-07-16 DIAGNOSIS — K7460 Unspecified cirrhosis of liver: Secondary | ICD-10-CM

## 2018-07-16 DIAGNOSIS — R188 Other ascites: Secondary | ICD-10-CM | POA: Diagnosis not present

## 2018-07-16 DIAGNOSIS — K7031 Alcoholic cirrhosis of liver with ascites: Secondary | ICD-10-CM

## 2018-07-16 HISTORY — PX: IR PARACENTESIS: IMG2679

## 2018-07-16 MED ORDER — ALBUMIN HUMAN 25 % IV SOLN
INTRAVENOUS | Status: AC
  Filled 2018-07-16: qty 200

## 2018-07-16 MED ORDER — LIDOCAINE HCL 1 % IJ SOLN
INTRAMUSCULAR | Status: AC | PRN
  Administered 2018-07-16: 10 mL

## 2018-07-16 MED ORDER — ALBUMIN HUMAN 25 % IV SOLN
50.0000 g | Freq: Once | INTRAVENOUS | Status: AC
  Administered 2018-07-16: 50 g via INTRAVENOUS

## 2018-07-16 MED ORDER — LIDOCAINE HCL 1 % IJ SOLN
INTRAMUSCULAR | Status: AC
  Filled 2018-07-16: qty 20

## 2018-07-16 NOTE — Procedures (Signed)
PROCEDURE SUMMARY:  Successful US guided paracentesis from right lateral abdomen.  Yielded 5.0 liters of yellow fluid.  No immediate complications.  Pt tolerated well.   Specimen was not sent for labs.  Hoyt KochKacie Sue-Ellen Matthews PA-C 07/16/2018 10:31 AM

## 2018-07-21 ENCOUNTER — Other Ambulatory Visit: Payer: Self-pay

## 2018-07-21 ENCOUNTER — Encounter: Payer: Self-pay | Admitting: Physician Assistant

## 2018-07-21 ENCOUNTER — Ambulatory Visit (INDEPENDENT_AMBULATORY_CARE_PROVIDER_SITE_OTHER): Payer: 59 | Admitting: Physician Assistant

## 2018-07-21 ENCOUNTER — Other Ambulatory Visit (INDEPENDENT_AMBULATORY_CARE_PROVIDER_SITE_OTHER): Payer: 59

## 2018-07-21 VITALS — BP 82/50 | HR 77 | Ht 67.0 in | Wt 144.0 lb

## 2018-07-21 DIAGNOSIS — I959 Hypotension, unspecified: Secondary | ICD-10-CM | POA: Diagnosis not present

## 2018-07-21 DIAGNOSIS — R410 Disorientation, unspecified: Secondary | ICD-10-CM | POA: Diagnosis not present

## 2018-07-21 DIAGNOSIS — K7290 Hepatic failure, unspecified without coma: Secondary | ICD-10-CM

## 2018-07-21 DIAGNOSIS — K7682 Hepatic encephalopathy: Secondary | ICD-10-CM

## 2018-07-21 DIAGNOSIS — K7460 Unspecified cirrhosis of liver: Secondary | ICD-10-CM

## 2018-07-21 DIAGNOSIS — K7011 Alcoholic hepatitis with ascites: Secondary | ICD-10-CM | POA: Diagnosis not present

## 2018-07-21 DIAGNOSIS — K7031 Alcoholic cirrhosis of liver with ascites: Secondary | ICD-10-CM

## 2018-07-21 LAB — COMPREHENSIVE METABOLIC PANEL
ALBUMIN: 2.9 g/dL — AB (ref 3.5–5.2)
ALK PHOS: 220 U/L — AB (ref 39–117)
ALT: 39 U/L (ref 0–53)
AST: 69 U/L — ABNORMAL HIGH (ref 0–37)
BUN: 5 mg/dL — ABNORMAL LOW (ref 6–23)
CALCIUM: 8.2 mg/dL — AB (ref 8.4–10.5)
CO2: 32 mEq/L (ref 19–32)
Chloride: 98 mEq/L (ref 96–112)
Creatinine, Ser: 0.57 mg/dL (ref 0.40–1.50)
GFR: 156.45 mL/min (ref >60.00)
Glucose, Bld: 93 mg/dL (ref 70–99)
Potassium: 2.9 mEq/L — ABNORMAL LOW (ref 3.5–5.1)
Sodium: 136 mEq/L (ref 135–145)
TOTAL PROTEIN: 6.2 g/dL (ref 6.0–8.3)
Total Bilirubin: 5.8 mg/dL — ABNORMAL HIGH (ref 0.2–1.2)

## 2018-07-21 MED ORDER — MIDODRINE HCL 5 MG PO TABS: 5.0000 mg | ORAL_TABLET | Freq: Three times a day (TID) | ORAL | 4 refills | Status: DC

## 2018-07-21 MED ORDER — LACTULOSE 10 GM/15ML PO SOLN: ORAL | 4 refills | Status: DC

## 2018-07-21 MED ORDER — AMILORIDE HCL 5 MG PO TABS: 5.0000 mg | ORAL_TABLET | Freq: Every day | ORAL | 4 refills | Status: DC

## 2018-07-21 MED ORDER — FUROSEMIDE 40 MG PO TABS: 60.0000 mg | ORAL_TABLET | Freq: Every day | ORAL | 4 refills | Status: DC

## 2018-07-21 MED FILL — MIDODRINE HCL 5 MG TABLET: 5 | 30 days supply | Qty: 90 | Fill #0

## 2018-07-21 MED FILL — LACTULOSE 10 GM/15 ML SOLN: 10 | 3 days supply | Qty: 473 | Fill #0

## 2018-07-21 NOTE — Progress Notes (Signed)
Subjective:    Patient ID: Kenneth [name], male    DOB: [birth year], 57 y.o.   MRN: [id number]  HPI Kenneth [name] is a 57 year old white male, known to Dr. Myrtie Neither and myself who was last seen in the office on 07/06/2018 and comes in today for follow-up. He has decompensated alcohol induced cirrhosis and persistent of ascites and peripheral edema despite undergoing TIPS on 05/24/2018.  TIPS has been investigated and was widely patent on Doppler ultrasound 06/29/2018. He has had problems with hypotension, severe fatigue and mild hepatic encephalopathy. He required paracentesis of 6 L on 06/29/2018 and had another paracentesis on 920 with removal of 5 L. At time of last office visit Lasix was increased from 40 mg to 60 mg daily and we increased Middaugh drain from 2.5 to 5 mg 3 times daily. Also added lactulose 2 tablespoons twice daily. He says he feels better overall, not quite as fatigued.  His abdomen is not tight and he denies shortness of breath.  He continues to have discomfort of bilateral lower extremities and complains of some burning type pain in his feet and toes.  He reports that he has discussed liver transplant again with his family, and his son accompanies him today.  He has done a lot of reading and would like to be referred  again for transplant evaluation as he is decided he would like to proceed.  Review of Systems Pertinent positive and negative review of systems were noted in the above HPI section.  All other review of systems was otherwise negative.  Outpatient Encounter Medications as of 07/21/2018  Medication Sig  . aMILoride (MIDAMOR) 5 MG tablet Take 1 tablet (5 mg total) by mouth daily.  . diclofenac sodium (VOLTAREN) 1 % GEL Apply 4 g topically 4 (four) times daily.  Marland Kitchen FLUoxetine (PROZAC) 40 MG capsule Take 1 capsule (40 mg total) by mouth daily.  . furosemide (LASIX) 40 MG tablet Take 1.5 tablets (60 mg total) by mouth daily.  Marland Kitchen lactulose (CHRONULAC) 10 GM/15ML solution Increase to  4 tablespoons by mouth twice daily.  . midodrine (PROAMATINE) 5 MG tablet Take 1 tablet (5 mg total) by mouth 3 (three) times daily with meals.  . Multiple Vitamin (MULTIVITAMIN WITH MINERALS) TABS tablet Take 1 tablet by mouth as needed.   . ondansetron (ZOFRAN) 4 MG tablet TAKE 1 TABLET BY MOUTH 2 TIMES DAILY AS NEEDED FOR NAUSEA OR VOMITING.  . [DISCONTINUED] aMILoride (MIDAMOR) 5 MG tablet Take 1 tablet (5 mg total) by mouth daily.  . [DISCONTINUED] furosemide (LASIX) 40 MG tablet Take 1.5 tablets (60 mg total) by mouth daily.  . [DISCONTINUED] lactulose (CHRONULAC) 10 GM/15ML solution Increase to 4 tablespoons by mouth twice daily.  . [DISCONTINUED] midodrine (PROAMATINE) 5 MG tablet Take 1 tablet (5 mg total) by mouth 3 (three) times daily with meals.   No facility-administered encounter medications on file as of 07/21/2018.    Allergies  Allergen Reactions  . Latex Swelling and Rash   Patient Active Problem List   Diagnosis Date Noted  . Ascites due to alcoholic cirrhosis (HCC) 05/19/2018  . Depression 12/18/2016  . Testosterone deficiency in male 08/11/2016  . Inguinal hernia 02/28/2016  . Rectal bleed 01/24/2016  . Umbilical hernia 01/24/2016  . Ascites 01/24/2016  . SBP (spontaneous bacterial peritonitis) (HCC) 01/24/2016  . Tobacco abuse 01/24/2016  . Gynecomastia, male 01/24/2016  . Abdominal pain, generalized   . Esophageal varices (HCC) 09/02/2014  . Portal hypertension with esophageal varices (HCC)  09/02/2014  . Portal hypertensive gastropathy (HCC) 09/02/2014  . Hiatal hernia 09/02/2014  . Acute blood loss anemia 09/02/2014  . Leukopenia 09/02/2014  . Thrombocytopenia (HCC) 09/02/2014  . Alcoholic cirrhosis of liver with ascites (HCC)   . Alcohol abuse 08/31/2014   Social History   Socioeconomic History  . Marital status: Single    Spouse name: Not on file  . Number of children: 1  . Years of education: Not on file  . Highest education level: Not on file    Occupational History  . Occupation: Administrator  Social Needs  . Financial resource strain: Not on file  . Food insecurity:    Worry: Not on file    Inability: Not on file  . Transportation needs:    Medical: Not on file    Non-medical: Not on file  Tobacco Use  . Smoking status: Current Every Day Smoker    Packs/day: 0.50    Years: 10.00    Pack years: 5.00    Types: Cigarettes  . Smokeless tobacco: Never Used  Substance and Sexual Activity  . Alcohol use: No    Comment: no alcohol since 01/23/16  . Drug use: Yes    Frequency: 5.0 times per week    Types: Marijuana  . Sexual activity: Not on file  Lifestyle  . Physical activity:    Days per week: Not on file    Minutes per session: Not on file  . Stress: Not on file  Relationships  . Social connections:    Talks on phone: Not on file    Gets together: Not on file    Attends religious service: Not on file    Active member of club or organization: Not on file    Attends meetings of clubs or organizations: Not on file    Relationship status: Not on file  . Intimate partner violence:    Fear of current or ex partner: Not on file    Emotionally abused: Not on file    Physically abused: Not on file    Forced sexual activity: Not on file  Other Topics Concern  . Not on file  Social History Narrative  . Not on file    Mr. Jahnke's family history includes Hyperlipidemia in his father; Hypertension in his father; Leukemia in his father.      Objective:    Vitals:   07/21/18 1115  BP: (!) 82/50  Pulse: 77    Physical Exam; well-developed chronically ill-appearing white male in no acute distress, accompanied by his son both pleasant blood pressure 92/50 pulse 77, height 5 foot 7, weight 144, BMI 22.5.  HEENT; nontraumatic normocephalic EOMI PERRLA sclera are anicteric, buccal mucosa moist, Cardiovascular; regular rate and rhythm with S1-S2 no murmur rub or gallop, Pulmonary ;clear bilaterally, Abdomen; soft, he has  non-tense ascites less than at last office visit, reducible umbilical hernia is nontender liver edge is palpable in the right upper quadrant, bowel sounds are present.  Rectal; exam not done, Extremities; 1+ edema to the shins bilaterally, Neuro psych; alert and oriented, bit brighter today, no asterixis mood and affect appropriate       Assessment & Plan:   #49 57 year old white male with decompensated alcohol induced cirrhosis that is post TIPS July 2019 with persistent ascites and peripheral edema.  Current MELD =19  Currently on Lasix 60 mg p.o. daily and amiloride 5 mg p.o. Daily Patient [facility] and had to  decided he did not want to pursue.  At this time he has changed his mind and would like to be referred again for transplant evaluation  #2 hypotension-on Midodrine #3 fatigue multifactorial improved  #4 hepatic encephalopathy- mild   Plan; CMET today I do not think patient needs a paracentesis at present, have asked him to call for increasing abdominal discomfort ,distention, shortness of breath etc. to be scheduled for paracentesis. Continue lactulose 4 tablespoons twice daily Continue Lasix 60 mg p.o. every morning Continue amiloride 5 mg p.o. daily Continue Midodrine   5 mg 3 times daily Patient states that he has been feeling depressed, he has been on 40 mg of Prozac and wants to know if he can increase his dose.  I advised him to discuss with his PCP.  Will discuss with Dr. Myrtie Neither, may try to bump up diuretics pending today's labs, Will initiate re-referral process to [facility] for transplant evaluation.  Malaika Arnall S Shanine Kreiger PA-C 07/21/2018   Cc: [name], MD

## 2018-07-21 NOTE — Progress Notes (Signed)
Thank you for sending this case to me. I have reviewed the entire note, and the outlined plan seems appropriate.  We cannot increase his diuretics with persistent hypotension.  I wonder if transplant hepatology will think we should increase midodrine.  I am in favor of the referral back to [facility], but I am concerned that his demonstrated lack of interest in that process thus far (with two now-shows to their clinic) may adversely affect consideration of his candidacy for liver transplant.  Amada Jupiter, MD

## 2018-07-21 NOTE — Patient Instructions (Addendum)
Your provider has requested that you go to the basement level for lab work before leaving today. Press "B" on the elevator. The lab is located at the first door on the left as you exit the elevator.  [facility] 1. Lasix 60 mg  2. Lactulose  3. Midodrine 4. Amiloride 5 mg  Continue these medications.   Normal BMI (Body Mass Index- based on height and weight) is between 19 and 25. Your BMI today is Body mass index is 22.55 kg/m. Marland Kitchen. Please consider follow up  regarding your BMI with your Primary Care Provider.

## 2018-07-23 ENCOUNTER — Other Ambulatory Visit: Payer: Self-pay

## 2018-07-23 DIAGNOSIS — E876 Hypokalemia: Secondary | ICD-10-CM

## 2018-07-23 MED ORDER — POTASSIUM CHLORIDE ER 20 MEQ PO TBCR: 40.0000 meq | EXTENDED_RELEASE_TABLET | Freq: Every day | ORAL | 0 refills | Status: DC

## 2018-07-27 MED FILL — LACTULOSE 10 GM/15 ML SOLN: 10 | 3 days supply | Qty: 473 | Fill #1

## 2018-08-02 ENCOUNTER — Encounter: Payer: Self-pay | Admitting: Family Medicine

## 2018-08-02 ENCOUNTER — Telehealth: Payer: Self-pay

## 2018-08-02 ENCOUNTER — Ambulatory Visit (HOSPITAL_BASED_OUTPATIENT_CLINIC_OR_DEPARTMENT_OTHER): Payer: 59 | Admitting: Licensed Clinical Social Worker

## 2018-08-02 ENCOUNTER — Ambulatory Visit: Payer: 59 | Attending: Family Medicine | Admitting: Family Medicine

## 2018-08-02 VITALS — BP 95/60 | HR 75 | Temp 98.0°F | Ht 67.0 in | Wt 150.6 lb

## 2018-08-02 DIAGNOSIS — G6289 Other specified polyneuropathies: Secondary | ICD-10-CM | POA: Diagnosis not present

## 2018-08-02 DIAGNOSIS — F325 Major depressive disorder, single episode, in full remission: Secondary | ICD-10-CM | POA: Diagnosis not present

## 2018-08-02 DIAGNOSIS — R5383 Other fatigue: Secondary | ICD-10-CM | POA: Diagnosis not present

## 2018-08-02 DIAGNOSIS — K7031 Alcoholic cirrhosis of liver with ascites: Secondary | ICD-10-CM | POA: Diagnosis not present

## 2018-08-02 DIAGNOSIS — E876 Hypokalemia: Secondary | ICD-10-CM | POA: Insufficient documentation

## 2018-08-02 DIAGNOSIS — I959 Hypotension, unspecified: Secondary | ICD-10-CM | POA: Insufficient documentation

## 2018-08-02 DIAGNOSIS — F332 Major depressive disorder, recurrent severe without psychotic features: Secondary | ICD-10-CM

## 2018-08-02 DIAGNOSIS — G629 Polyneuropathy, unspecified: Secondary | ICD-10-CM | POA: Insufficient documentation

## 2018-08-02 DIAGNOSIS — F419 Anxiety disorder, unspecified: Secondary | ICD-10-CM

## 2018-08-02 MED ORDER — ALBUMIN HUMAN 25 % IV SOLN: 50.0000 g | Freq: Once | INTRAVENOUS | Status: DC

## 2018-08-02 MED ORDER — GABAPENTIN 300 MG PO CAPS: 300.0000 mg | ORAL_CAPSULE | Freq: Every day | ORAL | 3 refills | Status: DC

## 2018-08-02 MED ORDER — FLUOXETINE HCL 40 MG PO CAPS: 40.0000 mg | ORAL_CAPSULE | Freq: Every day | ORAL | 3 refills | Status: DC

## 2018-08-02 MED ORDER — BUPROPION HCL ER (SR) 150 MG PO TB12: 150.0000 mg | ORAL_TABLET | Freq: Two times a day (BID) | ORAL | 3 refills | Status: DC

## 2018-08-02 MED ORDER — ALBUMIN HUMAN 25 % IV SOLN: 25.0000 g | Freq: Once | INTRAVENOUS | 0 refills | Status: DC

## 2018-08-02 MED FILL — LACTULOSE 10 GM/15 ML SOLN: 10 | 3 days supply | Qty: 473 | Fill #1

## 2018-08-02 MED FILL — BUPROPION SR 150 MG TABLET: 150 | 30 days supply | Qty: 60 | Fill #0

## 2018-08-02 MED FILL — GABAPENTIN 300 MG CAPSULE: 300 | 30 days supply | Qty: 30 | Fill #0

## 2018-08-02 NOTE — Telephone Encounter (Signed)
Met with the patient and his son, Aaron, when he was in the clinic for his appointment today.  He explained that he currently has insurance but will soon be loosing it.  Instructed him to check with his employer about insurance options after it is terminated.  Also provided him with the contact information for social security to apply for disability and DSS to apply for medicaid.  He said that he think he applied for disability years ago. He was agreeable to making a referral to [employer] [facility] [place] to assist with navigating his insurance options as well as the process of applying for guardianship. He is interested in having his son be appointed his guardian and understands it is a process that needs to be initiated through the court.  The Legal Aid referral was faxed to [facility] - [employer] [facility] [place].    He also reported concerns about caring for his mother. She has ? Dementia and wanders. He said that she refuses to see a doctor.  Provided him with the phone # for Adult Protective Services if he is concerned about her safety.   He was provided information about applying for the [employer] and [facility] Financial Assistance.   Call placed to [facility] as Dr Newlin is recommending a paracentesis.  Spoke to Beth # [phone] who explained that a referral can be placed in Epic for IR para and call can be placed to Radiology to schedule.  Call placed to [facility] after Dr Newlin placed the order. Spoke to Melinda and scheduled the paracentesis for tomorrow - 08/03/18 @ 1300, patient to arrive at 1245.  This appointment information was shared with the patient and his son. Melinda said that she could not see the order to administer albumin. She said that the order could be entered in Epic or faxed to IR - fax # [phone].  Dr Newlin informed and she placed a new order.  Call placed to IR, spoke to Brandi who stated that she is now able to see the order to administer IV albumin tomorrow  

## 2018-08-02 NOTE — Progress Notes (Signed)
Patient is having pain in feet.

## 2018-08-02 NOTE — Progress Notes (Signed)
Subjective:  Patient ID: Kenneth [name], male    DOB: [birth year]  Age: 57 y.o. MRN: [id number]  CC: Hypotension   HPI Kenneth [name] is a 57 year old male with a history of alcoholic cirrhosis (status post TIPS in 04/2018) , thrombocytopenia, previous history of SMV thrombosis , depression here for a follow up visit accompanied by his son for a follow-up visit. He complains of fatigue, decreased energy, daytime somnolence but states he has a good appetite. Last visit with GI was 2 weeks ago and notes reveal referral [facility] for possible liver transplant (of note he had demonstrated level of interest in the past).  He complains of lower extremity edema, scrotal swelling, abdominal swelling and discomfort, generalized pruritus and his last paracentesis was 2 weeks ago with 5 L of acetic fluid was removed and 2 weeks prior to that 4 L were removed. He has been compliant with his medications and with Lasix but his hypertension has limited further increases in dose of Lasix and he has been placed on midodrine.  He complains of being depressed despite being on Prozac and this is because he feels he is dying and he has several stressors including an elderly mom who is in his care.  His son lives down the road from him and comes in to check on him.  His medical insurance runs out in the middle of this month and he has been unable to return to work. Also complains of pain in the soles of both feet which feel like fire for the last 1 to 2 months.  Past Medical History:  Diagnosis Date  . Alcohol abuse   . Anxiety   . Arthritis    "never diagnosis"  . Blood clot in vein   . Cirrhosis (HCC)   . Depression   . Dyspnea    Due to ascites  . Esophageal varices (HCC) 09/02/2014  . H/O [facility] spotted fever    age 60 or 61  . Hernia, inguinal    right  . Hiatal hernia 09/02/2014  . Polysubstance abuse (HCC)   . Portal hypertension with esophageal varices (HCC) 09/02/2014  . Portal  hypertensive gastropathy (HCC) 09/02/2014  . Thrombocytopenia (HCC) 09/02/2014  . Upper GI bleed 08/31/2014    Past Surgical History:  Procedure Laterality Date  . ESOPHAGOGASTRODUODENOSCOPY Left 09/01/2014   Procedure: ESOPHAGOGASTRODUODENOSCOPY (EGD);  Surgeon: Charna Elizabeth, MD;  Location: [facility];  Service: Endoscopy;  Laterality: Left;  . EYE SURGERY Left    age 82  . IR PARACENTESIS  03/16/2018  . IR PARACENTESIS  03/29/2018  . IR PARACENTESIS  04/19/2018  . IR PARACENTESIS  04/27/2018  . IR PARACENTESIS  05/10/2018  . IR PARACENTESIS  05/19/2018  . IR PARACENTESIS  06/11/2018  . IR PARACENTESIS  06/29/2018  . IR PARACENTESIS  07/16/2018  . IR RADIOLOGIST EVAL & MGMT  05/13/2018  . IR RADIOLOGIST EVAL & MGMT  06/29/2018  . IR TIPS  05/19/2018  . RADIOLOGY WITH ANESTHESIA N/A 05/19/2018   Procedure: TIPS;  Surgeon: Simonne Come, MD;  Location: [facility] OR;  Service: Radiology;  Laterality: N/A;    Allergies  Allergen Reactions  . Latex Swelling and Rash     Outpatient Medications Prior to Visit  Medication Sig Dispense Refill  . aMILoride (MIDAMOR) 5 MG tablet Take 1 tablet (5 mg total) by mouth daily. 30 tablet 4  . diclofenac sodium (VOLTAREN) 1 % GEL Apply 4 g topically 4 (four) times daily. 100  g 0  . furosemide (LASIX) 40 MG tablet Take 1.5 tablets (60 mg total) by mouth daily. 45 tablet 4  . lactulose (CHRONULAC) 10 GM/15ML solution Increase to 4 tablespoons by mouth twice daily. 473 mL 4  . midodrine (PROAMATINE) 5 MG tablet Take 1 tablet (5 mg total) by mouth 3 (three) times daily with meals. 90 tablet 4  . Multiple Vitamin (MULTIVITAMIN WITH MINERALS) TABS tablet Take 1 tablet by mouth as needed.     . ondansetron (ZOFRAN) 4 MG tablet TAKE 1 TABLET BY MOUTH 2 TIMES DAILY AS NEEDED FOR NAUSEA OR VOMITING. 30 tablet 0  . Potassium Chloride ER 20 MEQ TBCR Take 40 mEq by mouth daily. 6 tablet 0  . FLUoxetine (PROZAC) 40 MG capsule Take 1 capsule (40 mg total) by mouth daily. 30 capsule 3     No facility-administered medications prior to visit.     ROS Review of Systems  Constitutional: Positive for activity change and fatigue. Negative for appetite change.  HENT: Negative for sinus pressure and sore throat.   Eyes: Negative for visual disturbance.  Respiratory: Negative for cough, chest tightness and shortness of breath.   Cardiovascular: Positive for leg swelling. Negative for chest pain.  Gastrointestinal: Negative for abdominal distention, abdominal pain, constipation and diarrhea.  Endocrine: Negative.   Genitourinary: Negative for dysuria.  Musculoskeletal:       See hpi  Skin: Negative for rash.  Allergic/Immunologic: Negative.   Neurological: Negative for weakness, light-headedness and numbness.  Psychiatric/Behavioral: Negative for dysphoric mood and suicidal ideas.    Objective:  BP 95/60   Pulse 75   Temp 98 F (36.7 C) (Oral)   Ht 5\' 7"  (1.702 m)   Wt 150 lb 9.6 oz (68.3 kg)   SpO2 100%   BMI 23.59 kg/m   BP/Weight 08/02/2018 07/21/2018 07/16/2018  Systolic BP 95 82 97  Diastolic BP 60 50 63  Wt. (Lbs) 150.6 144 -  BMI 23.59 22.55 -     Physical Exam  Constitutional: He is oriented to person, place, and time.  Chronically ill looking  Eyes: Scleral icterus is present.  Cardiovascular: Normal rate, normal heart sounds and intact distal pulses.  No murmur heard. Pulmonary/Chest: Effort normal and breath sounds normal. He has no wheezes. He has no rales. He exhibits no tenderness.  Abdominal: Soft. Bowel sounds are normal. He exhibits no distension and no mass. There is no tenderness.  Genitourinary:  Genitourinary Comments: Scrotal edema  Musculoskeletal: Normal range of motion. He exhibits edema (1+ b/l pitting pedal edema).  Neurological: He is alert and oriented to person, place, and time.  Skin: Skin is warm and dry.  Psychiatric: He has a normal mood and affect.     Assessment & Plan:   1. Ascites due to alcoholic cirrhosis  (HCC) Decompensated status post TIPS Currently with fluid overload -he needs paracentesis We will call IR to set up his paracentesis Unable to return to work -he will need to obtain a form from work for signatures to this effect Referred for liver transplant with [facility] by GI. Hypotension limits increasing Lasix dose I have discussed the need for healthcare power of attorney and we have provided him documents for his son who will be his power of Attorney Montez Morita) to have this signed Case manager and LCSW called in to see the patient to assist with navigating transition from her insurance and Social Security issues.  His insurance will be discontinued soon.   2.  Other fatigue Likely due to chronic condition We will check for other metabolic causes and supplement if indicated - VITAMIN D 25 Hydroxy (Vit-D Deficiency, Fractures) - T4, free - TSH  3. 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 Major depressive disorder with single episode, in full remission (HCC) Uncontrolled Wellbutrin added to regimen; unable to use trazodone given he has a prolonged QT interval - FLUoxetine (PROZAC) 40 MG capsule; Take 1 capsule (40 mg total) by mouth daily.  Dispense: 30 capsule; Refill: 3  5.  Hypokalemia Secondary to Lasix use Last potassium was 2.9; he just started taking his potassium supplements yesterday. It is too soon to check a potassium level.  Meds ordered this encounter  Medications  . buPROPion (WELLBUTRIN SR) 150 MG 12 hr tablet    Sig: Take 1 tablet (150 mg total) by mouth 2 (two) times daily.    Dispense:  60 tablet    Refill:  3  . gabapentin (NEURONTIN) 300 MG capsule    Sig: Take 1 capsule (300 mg total) by mouth at bedtime.    Dispense:  30 capsule    Refill:  3  . FLUoxetine (PROZAC) 40 MG capsule    Sig: Take 1 capsule (40 mg total) by mouth daily.     Dispense:  30 capsule    Refill:  3    Follow-up: 6 weeks for follow-up of chronic medical conditions  [name] MD

## 2018-08-03 ENCOUNTER — Ambulatory Visit (HOSPITAL_COMMUNITY)
Admission: RE | Admit: 2018-08-03 | Discharge: 2018-08-03 | Disposition: A | Discharge status: Home / Self Care | Payer: 59 | Source: Ambulatory Visit | Attending: Family Medicine | Admitting: Family Medicine

## 2018-08-03 ENCOUNTER — Other Ambulatory Visit: Payer: Self-pay | Admitting: Family Medicine

## 2018-08-03 ENCOUNTER — Encounter (HOSPITAL_COMMUNITY): Payer: Self-pay | Admitting: Physician Assistant

## 2018-08-03 DIAGNOSIS — K7460 Unspecified cirrhosis of liver: Secondary | ICD-10-CM | POA: Insufficient documentation

## 2018-08-03 DIAGNOSIS — R188 Other ascites: Secondary | ICD-10-CM | POA: Insufficient documentation

## 2018-08-03 HISTORY — PX: IR PARACENTESIS: IMG2679

## 2018-08-03 LAB — VITAMIN D 25 HYDROXY (VIT D DEFICIENCY, FRACTURES): VIT D 25 HYDROXY: 16.4 ng/mL — AB (ref 30.0–100.0)

## 2018-08-03 LAB — T4, FREE: Free T4: 1.01 ng/dL (ref 0.82–1.77)

## 2018-08-03 LAB — TSH: TSH: 1.68 u[IU]/mL (ref 0.450–4.500)

## 2018-08-03 MED ORDER — ALBUMIN HUMAN 25 % IV SOLN
25.0000 g | Freq: Once | INTRAVENOUS | Status: AC
  Administered 2018-08-03: 25 g via INTRAVENOUS

## 2018-08-03 MED ORDER — ALBUMIN HUMAN 25 % IV SOLN
INTRAVENOUS | Status: AC
  Administered 2018-08-03: 25 g via INTRAVENOUS
  Filled 2018-08-03: qty 100

## 2018-08-03 MED ORDER — ERGOCALCIFEROL 1.25 MG (50000 UT) PO CAPS: 50000.0000 [IU] | ORAL_CAPSULE | ORAL | 1 refills | Status: DC

## 2018-08-03 MED ORDER — LIDOCAINE HCL 1 % IJ SOLN
INTRAMUSCULAR | Status: AC
  Filled 2018-08-03: qty 20

## 2018-08-03 MED ORDER — LIDOCAINE HCL 1 % IJ SOLN
INTRAMUSCULAR | Status: AC | PRN
  Administered 2018-08-03: 10 mL

## 2018-08-03 NOTE — Procedures (Signed)
PROCEDURE SUMMARY:  Successful image-guided paracentesis from the left lower abdomen.  Yielded 4.1 liters of yellow fluid.  No immediate complications.  Patient tolerated well.   Specimen was not sent for labs.  Patient to receive post procedure albumin.  Villa Herb PA-C 08/03/2018 1:36 PM

## 2018-08-03 NOTE — BH Specialist Note (Signed)
Integrated Behavioral Health Initial Visit  MRN: [id number] Name: Kenneth [name]  Number of Integrated Behavioral Health Clinician visits:: 1/6 Session Start time: 3:00 PM  Session End time: 3:30 PM Total time: 30 minutes  Type of Service: Integrated Behavioral Health- Individual/Family Interpretor:No. Interpretor Name and Language: N/A   Warm Hand Off Completed.       SUBJECTIVE: Kenneth [name] is a 57 y.o. male accompanied by adult son, Kenneth [name] Patient was referred by Dr. Alvis [name] for depression and anxiety. Patient reports the following symptoms/concerns: feelings of sadness and worry, low motivation, difficulty sleeping, pain, feeling bad about self, decreased concentration, irritability, racing thoughts, hx of substance use, and suicidal ideations Duration of problem: Ongoing; Severity of problem: severe  OBJECTIVE: Mood: Dysphoric and Affect: Depressed Risk of harm to self or others: No plan to harm self or others Pt scored positive on PHQ-9; however, denied current SI/HI/AVH. Protective factors were identified, safety plan was discussed, and crisis intervention resources provided.  LIFE CONTEXT: Family and Social: Pt receives strong support from adult son, Kenneth [name], who was present during visit. He has siblings who reside locally and provides minimal assistance with the care of pt's elderly mother School/Work: Pt is not employed and will be soon losing his medical coverage.  Self-Care: Pt participates in medication management Life Changes: Pt reports difficulty managing and coping with ongoing medical conditions   GOALS ADDRESSED: Patient will: 1. Reduce symptoms of: anxiety and depression 2. Increase knowledge and/or ability of: coping skills and healthy habits  3. Demonstrate ability to: Increase healthy adjustment to current life circumstances, Increase adequate support systems for patient/family and Increase motivation to adhere to plan of  care  INTERVENTIONS: Interventions utilized: Solution-Focused Strategies, Supportive Counseling and Link to [employer]  Standardized Assessments completed: GAD-7 and PHQ 2&9 with C-SSRS  ASSESSMENT: Patient currently experiencing depression and anxiety triggered by difficulty coping and managing ongoing medical conditions. He reports feelings of sadness and worry, low motivation, difficulty sleeping, pain, feeling bad about self, decreased concentration, irritability, racing thoughts, hx of substance use, and suicidal ideations. Protective factors were identified, safety plan was discussed, and crisis intervention resources provided.   Patient may benefit from psychotherapy. LCSWA discussed therapeutic interventions to assist in coping skills and maintaining motivation to remain sober. Family were provided Advanced Directives and were informed of how to complete documentation. A legal aid referral was completed to provide additional support services, in addition, to the number to Adult [employer]. Pt was strongly encouraged to schedule appointment with Financial Counseling to assist with medical coverage.  PLAN: 1. Follow up with behavioral health clinician on : Pt was encouraged to contact LCSWA if symptoms worsen or fail to improve to schedule behavioral appointments at [facility]. 2. Behavioral recommendations: LCSWA recommends that pt apply healthy coping skills discussed and utilize provided resources. Pt is encouraged to schedule follow up appointment with LCSWA 3. Referral(s): Integrated [employer] (In Clinic) and Legal Aid, APS 4. "From scale of 1-10, how likely are you to follow plan?": 7/10  Bridgett Larsson, LCSW 08/05/18 12:18 PM

## 2018-08-04 MED FILL — VIT D2 1.25 MG (50,000 UNIT: 1.25 MG | 28 days supply | Qty: 4 | Fill #0

## 2018-08-05 MED FILL — LACTULOSE 10 GM/15 ML SOLN: 10 | 3 days supply | Qty: 473 | Fill #2

## 2018-08-06 MED FILL — aMILoride HCL 5 MG TABS: 5 | 30 days supply | Qty: 30 | Fill #1

## 2018-08-11 ENCOUNTER — Other Ambulatory Visit (INDEPENDENT_AMBULATORY_CARE_PROVIDER_SITE_OTHER): Payer: 59

## 2018-08-11 ENCOUNTER — Ambulatory Visit (INDEPENDENT_AMBULATORY_CARE_PROVIDER_SITE_OTHER): Payer: 59 | Admitting: Gastroenterology

## 2018-08-11 ENCOUNTER — Encounter: Payer: Self-pay | Admitting: Gastroenterology

## 2018-08-11 ENCOUNTER — Other Ambulatory Visit: Payer: Self-pay | Admitting: Gastroenterology

## 2018-08-11 VITALS — BP 100/62 | HR 75 | Ht 67.0 in | Wt 145.4 lb

## 2018-08-11 DIAGNOSIS — R110 Nausea: Secondary | ICD-10-CM

## 2018-08-11 DIAGNOSIS — K7031 Alcoholic cirrhosis of liver with ascites: Secondary | ICD-10-CM | POA: Diagnosis not present

## 2018-08-11 DIAGNOSIS — I8500 Esophageal varices without bleeding: Secondary | ICD-10-CM

## 2018-08-11 DIAGNOSIS — D689 Coagulation defect, unspecified: Secondary | ICD-10-CM

## 2018-08-11 DIAGNOSIS — D696 Thrombocytopenia, unspecified: Secondary | ICD-10-CM

## 2018-08-11 DIAGNOSIS — K7682 Hepatic encephalopathy: Secondary | ICD-10-CM

## 2018-08-11 DIAGNOSIS — K7290 Hepatic failure, unspecified without coma: Secondary | ICD-10-CM

## 2018-08-11 DIAGNOSIS — K7460 Unspecified cirrhosis of liver: Secondary | ICD-10-CM

## 2018-08-11 DIAGNOSIS — K766 Portal hypertension: Secondary | ICD-10-CM

## 2018-08-11 LAB — COMPREHENSIVE METABOLIC PANEL
ALK PHOS: 155 U/L — AB (ref 39–117)
ALT: 33 U/L (ref 0–53)
AST: 54 U/L — AB (ref 0–37)
Albumin: 2.7 g/dL — ABNORMAL LOW (ref 3.5–5.2)
BILIRUBIN TOTAL: 5.4 mg/dL — AB (ref 0.2–1.2)
BUN: 7 mg/dL (ref 6–23)
CO2: 29 mEq/L (ref 19–32)
Calcium: 8.4 mg/dL (ref 8.4–10.5)
Chloride: 105 mEq/L (ref 96–112)
Creatinine, Ser: 0.83 mg/dL (ref 0.40–1.50)
GFR: 101.38 mL/min (ref >60.00)
Glucose, Bld: 99 mg/dL (ref 70–99)
POTASSIUM: 3.7 meq/L (ref 3.5–5.1)
Sodium: 141 mEq/L (ref 135–145)
Total Protein: 5.9 g/dL — ABNORMAL LOW (ref 6.0–8.3)

## 2018-08-11 LAB — CBC WITH DIFFERENTIAL/PLATELET
BASOS PCT: 0.4 % (ref 0.0–3.0)
Basophils Absolute: 0 10*3/uL (ref 0.0–0.1)
EOS ABS: 0.1 10*3/uL (ref 0.0–0.7)
Eosinophils Relative: 1.4 % (ref 0.0–5.0)
HCT: 35.2 % — ABNORMAL LOW (ref 39.0–52.0)
HEMOGLOBIN: 12.3 g/dL — AB (ref 13.0–17.0)
Lymphocytes Relative: 13.2 % (ref 12.0–46.0)
Lymphs Abs: 0.7 10*3/uL (ref 0.7–4.0)
MCHC: 34.9 g/dL (ref 30.0–36.0)
MCV: 94.8 fl (ref 78.0–100.0)
Monocytes Absolute: 1.1 10*3/uL — ABNORMAL HIGH (ref 0.1–1.0)
Monocytes Relative: 20.3 % — ABNORMAL HIGH (ref 3.0–12.0)
NEUTROS ABS: 3.6 10*3/uL (ref 1.4–7.7)
Neutrophils Relative %: 64.7 % (ref 43.0–77.0)
PLATELETS: 81 10*3/uL — AB (ref 150.0–400.0)
RBC: 3.72 Mil/uL — ABNORMAL LOW (ref 4.22–5.81)
RDW: 16.4 % — AB (ref 11.5–15.5)
WBC: 5.5 10*3/uL (ref 4.0–10.5)

## 2018-08-11 LAB — PROTIME-INR
INR: 2 ratio — ABNORMAL HIGH (ref 0.8–1.0)
PROTHROMBIN TIME: 22.6 s — AB (ref 9.6–13.1)

## 2018-08-11 MED ORDER — AMILORIDE HCL 5 MG PO TABS: 10.0000 mg | ORAL_TABLET | Freq: Every day | ORAL | 2 refills | Status: DC

## 2018-08-11 MED ORDER — ONDANSETRON HCL 4 MG PO TABS: 4.0000 mg | ORAL_TABLET | Freq: Three times a day (TID) | ORAL | 3 refills | Status: DC | PRN

## 2018-08-11 MED FILL — ONDANSETRON HCL 4 MG TABLET: 4 | 20 days supply | Qty: 60 | Fill #0

## 2018-08-11 NOTE — Progress Notes (Signed)
[facility] Progress Note  Chief Complaint: Cirrhosis and ascites  Subjective  History:  Lela follows up for his cirrhosis with large volume ascites and edema.  He has not consumed alcohol in years, but his condition decompensated earlier this year without clear trigger.  He developed large volume ascites, treatment of which was limited by persistent hypotension, thus limited diuretic dosing.  He underwent TIPS procedure with initially very good response, but then recurrence of ascites and edema.  His compliance with entire treatment regimen including sodium restriction has been questionable.  We recently started him on midodrine in an effort to improve blood pressure and give [place] more room for diuretic dosing.  He is now back to requiring regular therapeutic paracentesis, which was done on October 8, September 20, September 3, August 16, and July 24.   He is complaining of abdominal bloating and distention as well as more frequent nausea with intermittent vomiting.  Zofran works well to control the nausea and vomiting, and lately he has been taking it about every morning.  He does not vomit undigested food, but he does have early satiety, denies dysphagia.  There is still seems to be some communication difficulty where he has confusion about his medicine doses.  After recent lab work, I had hoped to change his diuretics and gave that result note to my nurse.  He does not recall that conversation and did not change his med doses. His son [name] is with him today, he reports living about a mile away and trying to help his father manage his medical care. Amadu would like to be reconsidered for liver transplant clinic evaluation.  This was apparently discussed at his recent visit and a referral reportedly sent, but he says he has not heard from the clinic.  He typically has 2-3 BMs per day while taking lactulose 4 tablespoons twice daily.  ROS: Cardiovascular:  no chest  pain Respiratory: He has dyspnea with exertion The patient's Past Medical, Family and Social History were reviewed and are on file in the EMR. He has chronic fatigue  Remainder of systems negative except as above  Objective:  Med list reviewed  Current Outpatient Medications:  .  aMILoride (MIDAMOR) 5 MG tablet, Take 1 tablet (5 mg total) by mouth daily., Disp: 30 tablet, Rfl: 4 .  buPROPion (WELLBUTRIN SR) 150 MG 12 hr tablet, Take 1 tablet (150 mg total) by mouth 2 (two) times daily., Disp: 60 tablet, Rfl: 3 .  diclofenac sodium (VOLTAREN) 1 % GEL, Apply 4 g topically 4 (four) times daily., Disp: 100 g, Rfl: 0 .  ergocalciferol (DRISDOL) 50000 units capsule, Take 1 capsule (50,000 Units total) by mouth once a week., Disp: 9 capsule, Rfl: 1 .  FLUoxetine (PROZAC) 40 MG capsule, Take 1 capsule (40 mg total) by mouth daily., Disp: 30 capsule, Rfl: 3 .  furosemide (LASIX) 40 MG tablet, Take 1.5 tablets (60 mg total) by mouth daily., Disp: 45 tablet, Rfl: 4 .  gabapentin (NEURONTIN) 300 MG capsule, Take 1 capsule (300 mg total) by mouth at bedtime., Disp: 30 capsule, Rfl: 3 .  lactulose (CHRONULAC) 10 GM/15ML solution, Increase to 4 tablespoons by mouth twice daily., Disp: 473 mL, Rfl: 4 .  midodrine (PROAMATINE) 5 MG tablet, Take 1 tablet (5 mg total) by mouth 3 (three) times daily with meals., Disp: 90 tablet, Rfl: 4 .  Multiple Vitamin (MULTIVITAMIN WITH MINERALS) TABS tablet, Take 1 tablet by mouth as needed. , Disp: , Rfl:  .  Potassium Chloride ER 20 MEQ TBCR, Take 40 mEq by mouth daily., Disp: 6 tablet, Rfl: 0 .  ondansetron (ZOFRAN) 4 MG tablet, Take 1 tablet (4 mg total) by mouth every 8 (eight) hours as needed for nausea or vomiting., Disp: 60 tablet, Rfl: 3  Current Facility-Administered Medications:  .  albumin human 25 % solution 50 g, 50 g, Intravenous, Once, Newlin, Enobong, MD   Vital signs in last 24 hrs: Vitals:   08/11/18 1313  BP: 100/62  Pulse: 75    Physical  Exam  He is chronically ill-appearing, decreased affect, alert, conversational, fluent speech.  He gets on the exam table slowly and without assistance.  HEENT: sclera mildly icteric, oral mucosa moist without lesions  Neck: supple, no thyromegaly, JVD or lymphadenopathy  Cardiac: RRR without murmurs, S1S2 heard, no peripheral edema  Pulm: clear to auscultation bilaterally, normal RR and effort noted  Abdomen: soft, distended with ascites, no tenderness, with active bowel sounds. No guarding or palpable hepatosplenomegaly.  His ascites is not tense  Skin; warm and dry, + jaundice, no rash  Neuro no tremor or asterixis  Recent Labs:  CMP Latest Ref Rng & Units 08/11/2018 07/21/2018 07/06/2018  Glucose 70 - 99 mg/dL 99 93 95  BUN 6 - 23 mg/dL 7 5(L) 5(L)  Creatinine 0.40 - 1.50 mg/dL 4.09 8.11 9.14  Sodium 135 - 145 mEq/L 141 136 139  Potassium 3.5 - 5.1 mEq/L 3.7 2.9(L) 4.2  Chloride 96 - 112 mEq/L 105 98 105  CO2 19 - 32 mEq/L 29 32 28  Calcium 8.4 - 10.5 mg/dL 8.4 7.8(G) 8.3(L)  Total Protein 6.0 - 8.3 g/dL 5.9(L) 6.2 6.2  Total Bilirubin 0.2 - 1.2 mg/dL 9.5(A) 5.8(H) 6.4(H)  Alkaline Phos 39 - 117 U/L 155(H) 220(H) 163(H)  AST 0 - 37 U/L 54(H) 69(H) 65(H)  ALT 0 - 53 U/L 33 39 37   My advice after 07/21/18 CMP was: " Increase amiloride to 10 mg once daily Decreased furosemide to 40 mg once daily Increase midodrine to 7.5 mg three times daily.  He does not recall being told to make any of those changes.  Potassium chloride 40 meq by mouth once daily x 3 days.  Recheck BMP in 2 weeks. "  Last INR 1.7 on 07/06/18  CBC Latest Ref Rng & Units 08/11/2018 07/06/2018 06/24/2018  WBC 4.0 - 10.5 K/uL 5.5 4.8 5.0  Hemoglobin 13.0 - 17.0 g/dL 12.3(L) 12.3(L) 11.9(L)  Hematocrit 39.0 - 52.0 % 35.2(L) 35.2(L) 34.3(L)  Platelets 150.0 - 400.0 K/uL 81.0(L) 83.0(L) 78.0(L)    Radiologic studies:  Paracentesis reports were reviewed.  He typically has 4 to 6 L removed with  administration of albumin.  @ASSESSMENTPLANBEGIN @ Assessment: Encounter Diagnoses  Name Primary?  . Decompensated hepatic cirrhosis (HCC) Yes  . Alcoholic cirrhosis of liver with ascites (HCC)   . Encephalopathy, hepatic (HCC)   . Coagulopathy (HCC)   . Thrombocytopenia (HCC)   . Portal hypertension with esophageal varices (HCC)   . Nausea without vomiting    His liver disease has been slowly worsening, he has persistent large volume ascites despite diuretics.  Diuretic dosing limited by hypotension despite midodrine.  The labs done on his way out from clinic today came back later in the afternoon and showed that his INR has worsened, LFTs relatively stable, renal function fortunately normal.  Nausea that I believe is from his severe portal hypertension as well as gastric compression from large volume ascites.  Plan: I will  increase amiloride to 10 mg daily Continue furosemide 60 mg daily Keep Midodrine at current dose Keep lactulose current dose  I am trying not to make too many changes because I think it is confusing for him, and I also am concerned about the risk of hepatorenal syndrome.  I believe he understands the gravity of the situation.  We will make an inquiry to the [facility] liver transplant clinic to make sure they received a referral, but also, frankly, that they are agreeable to seeing him.  He did not make to prior consults with them within a few weeks, which does not help his consideration for transplant.  We also arranged a large volume paracentesis for later next week for up to 5 L and with 25 g of IV albumin.  Check a cell count without fluid to rule out SBP.  I renewed his Zofran prescription.  Total time 45 minutes, over half spent face-to-face with patient in counseling and coordination of care.   Charlie Pitter III

## 2018-08-11 NOTE — Patient Instructions (Addendum)
If you are age 57 or older, your body mass index should be between 23-30. Your Body mass index is 22.77 kg/m. If this is out of the aforementioned range listed, please consider follow up with your Primary Care Provider.  If you are age 11 or younger, your body mass index should be between 19-25. Your Body mass index is 22.77 kg/m. If this is out of the aformentioned range listed, please consider follow up with your Primary Care Provider.   You have been scheduled for an abdominal paracentesis at [facility] radiology (1st floor of hospital) on 08-18-2018 at 1pm. Please arrive at least 15 minutes prior to your appointment time for registration. Should you need to reschedule this appointment for any reason, please call our office at [phone].  Your provider has requested that you go to the basement level for lab work before leaving today. Press "B" on the elevator. The lab is located at the first door on the left as you exit the elevator.    It was a pleasure to see you today!  Dr. Myrtie Neither

## 2018-08-12 ENCOUNTER — Other Ambulatory Visit: Payer: Self-pay

## 2018-08-12 DIAGNOSIS — K7031 Alcoholic cirrhosis of liver with ascites: Secondary | ICD-10-CM

## 2018-08-15 ENCOUNTER — Inpatient Hospital Stay (HOSPITAL_COMMUNITY)
Admission: EM | Admit: 2018-08-15 | Discharge: 2018-08-26 | DRG: 433 | Disposition: A | Discharge status: Skilled Nursing Facility | Payer: Medicaid Other | Attending: Internal Medicine | Admitting: Internal Medicine

## 2018-08-15 ENCOUNTER — Other Ambulatory Visit: Payer: Self-pay

## 2018-08-15 ENCOUNTER — Encounter (HOSPITAL_COMMUNITY): Payer: Self-pay

## 2018-08-15 DIAGNOSIS — K7041 Alcoholic hepatic failure with coma: Principal | ICD-10-CM | POA: Diagnosis present

## 2018-08-15 DIAGNOSIS — D6959 Other secondary thrombocytopenia: Secondary | ICD-10-CM | POA: Diagnosis present

## 2018-08-15 DIAGNOSIS — F325 Major depressive disorder, single episode, in full remission: Secondary | ICD-10-CM

## 2018-08-15 DIAGNOSIS — F419 Anxiety disorder, unspecified: Secondary | ICD-10-CM | POA: Diagnosis present

## 2018-08-15 DIAGNOSIS — K766 Portal hypertension: Secondary | ICD-10-CM | POA: Diagnosis present

## 2018-08-15 DIAGNOSIS — R64 Cachexia: Secondary | ICD-10-CM | POA: Diagnosis present

## 2018-08-15 DIAGNOSIS — E876 Hypokalemia: Secondary | ICD-10-CM | POA: Diagnosis present

## 2018-08-15 DIAGNOSIS — K7682 Hepatic encephalopathy: Secondary | ICD-10-CM | POA: Diagnosis present

## 2018-08-15 DIAGNOSIS — F329 Major depressive disorder, single episode, unspecified: Secondary | ICD-10-CM | POA: Diagnosis present

## 2018-08-15 DIAGNOSIS — R410 Disorientation, unspecified: Secondary | ICD-10-CM

## 2018-08-15 DIAGNOSIS — Z8349 Family history of other endocrine, nutritional and metabolic diseases: Secondary | ICD-10-CM

## 2018-08-15 DIAGNOSIS — R109 Unspecified abdominal pain: Secondary | ICD-10-CM

## 2018-08-15 DIAGNOSIS — D696 Thrombocytopenia, unspecified: Secondary | ICD-10-CM | POA: Diagnosis present

## 2018-08-15 DIAGNOSIS — F32A Depression, unspecified: Secondary | ICD-10-CM | POA: Diagnosis present

## 2018-08-15 DIAGNOSIS — I498 Other specified cardiac arrhythmias: Secondary | ICD-10-CM

## 2018-08-15 DIAGNOSIS — G9340 Encephalopathy, unspecified: Secondary | ICD-10-CM

## 2018-08-15 DIAGNOSIS — Z23 Encounter for immunization: Secondary | ICD-10-CM

## 2018-08-15 DIAGNOSIS — R188 Other ascites: Secondary | ICD-10-CM

## 2018-08-15 DIAGNOSIS — I8500 Esophageal varices without bleeding: Secondary | ICD-10-CM | POA: Diagnosis present

## 2018-08-15 DIAGNOSIS — K7201 Acute and subacute hepatic failure with coma: Secondary | ICD-10-CM

## 2018-08-15 DIAGNOSIS — K7290 Hepatic failure, unspecified without coma: Secondary | ICD-10-CM | POA: Diagnosis present

## 2018-08-15 DIAGNOSIS — F17210 Nicotine dependence, cigarettes, uncomplicated: Secondary | ICD-10-CM | POA: Diagnosis present

## 2018-08-15 DIAGNOSIS — Z66 Do not resuscitate: Secondary | ICD-10-CM | POA: Diagnosis not present

## 2018-08-15 DIAGNOSIS — R278 Other lack of coordination: Secondary | ICD-10-CM | POA: Diagnosis present

## 2018-08-15 DIAGNOSIS — R161 Splenomegaly, not elsewhere classified: Secondary | ICD-10-CM | POA: Diagnosis present

## 2018-08-15 DIAGNOSIS — Z806 Family history of leukemia: Secondary | ICD-10-CM

## 2018-08-15 DIAGNOSIS — K7030 Alcoholic cirrhosis of liver without ascites: Secondary | ICD-10-CM | POA: Diagnosis present

## 2018-08-15 DIAGNOSIS — I8510 Secondary esophageal varices without bleeding: Secondary | ICD-10-CM | POA: Diagnosis present

## 2018-08-15 DIAGNOSIS — D649 Anemia, unspecified: Secondary | ICD-10-CM | POA: Diagnosis present

## 2018-08-15 DIAGNOSIS — K7031 Alcoholic cirrhosis of liver with ascites: Secondary | ICD-10-CM

## 2018-08-15 DIAGNOSIS — Z91040 Latex allergy status: Secondary | ICD-10-CM

## 2018-08-15 DIAGNOSIS — Z8249 Family history of ischemic heart disease and other diseases of the circulatory system: Secondary | ICD-10-CM

## 2018-08-15 DIAGNOSIS — Z6821 Body mass index (BMI) 21.0-21.9, adult: Secondary | ICD-10-CM

## 2018-08-15 DIAGNOSIS — I9589 Other hypotension: Secondary | ICD-10-CM | POA: Diagnosis present

## 2018-08-15 DIAGNOSIS — E440 Moderate protein-calorie malnutrition: Secondary | ICD-10-CM | POA: Diagnosis present

## 2018-08-15 LAB — URINALYSIS, ROUTINE W REFLEX MICROSCOPIC
Bilirubin Urine: NEGATIVE
Glucose, UA: NEGATIVE mg/dL
Hgb urine dipstick: NEGATIVE
Ketones, ur: NEGATIVE mg/dL
Leukocytes, UA: NEGATIVE
Nitrite: NEGATIVE
Protein, ur: NEGATIVE mg/dL
Specific Gravity, Urine: 1.006 (ref 1.005–1.030)
pH: 8 (ref 5.0–8.0)

## 2018-08-15 LAB — COMPREHENSIVE METABOLIC PANEL
ALT: 39 U/L (ref 0–44)
AST: 75 U/L — ABNORMAL HIGH (ref 15–41)
Albumin: 2.6 g/dL — ABNORMAL LOW (ref 3.5–5.0)
Alkaline Phosphatase: 154 U/L — ABNORMAL HIGH (ref 38–126)
Anion gap: 10 (ref 5–15)
BUN: 7 mg/dL (ref 6–20)
CO2: 29 mmol/L (ref 22–32)
Calcium: 8.2 mg/dL — ABNORMAL LOW (ref 8.9–10.3)
Chloride: 100 mmol/L (ref 98–111)
Creatinine, Ser: 0.78 mg/dL (ref 0.61–1.24)
GFR calc Af Amer: >60 mL/min (ref >60)
GFR calc non Af Amer: >60 mL/min (ref >60)
Glucose, Bld: 108 mg/dL — ABNORMAL HIGH (ref 70–99)
Potassium: 2.3 mmol/L — CL (ref 3.5–5.1)
Sodium: 139 mmol/L (ref 135–145)
Total Bilirubin: 5.9 mg/dL — ABNORMAL HIGH (ref 0.3–1.2)
Total Protein: 6.4 g/dL — ABNORMAL LOW (ref 6.5–8.1)

## 2018-08-15 LAB — CBC
HCT: 40.6 % (ref 39.0–52.0)
Hemoglobin: 13.5 g/dL (ref 13.0–17.0)
MCH: 31.7 pg (ref 26.0–34.0)
MCHC: 33.3 g/dL (ref 30.0–36.0)
MCV: 95.3 fL (ref 80.0–100.0)
Platelets: 91 10*3/uL — ABNORMAL LOW (ref 150–400)
RBC: 4.26 MIL/uL (ref 4.22–5.81)
RDW: 15.9 % — ABNORMAL HIGH (ref 11.5–15.5)
WBC: 6.3 10*3/uL (ref 4.0–10.5)
nRBC: 0 % (ref 0.0–0.2)

## 2018-08-15 LAB — AMMONIA: Ammonia: 79 umol/L — ABNORMAL HIGH (ref 9–35)

## 2018-08-15 LAB — LIPASE, BLOOD: Lipase: 60 U/L — ABNORMAL HIGH (ref 11–51)

## 2018-08-15 MED ORDER — POTASSIUM CHLORIDE 10 MEQ/100ML IV SOLN
10.0000 meq | INTRAVENOUS | Status: AC
  Administered 2018-08-15 – 2018-08-16 (×6): 10 meq via INTRAVENOUS
  Filled 2018-08-15 (×7): qty 100

## 2018-08-15 MED ORDER — SODIUM CHLORIDE 0.9 % IV SOLN
Freq: Once | INTRAVENOUS | Status: AC
  Administered 2018-08-15: 22:00:00 via INTRAVENOUS

## 2018-08-15 MED ORDER — POTASSIUM CHLORIDE 10 MEQ/100ML IV SOLN
10.0000 meq | INTRAVENOUS | Status: DC
  Administered 2018-08-15: 10 meq via INTRAVENOUS
  Filled 2018-08-15: qty 100

## 2018-08-15 NOTE — ED Notes (Addendum)
RN called for result on patients blood works. Lab tech stated they just processed it.

## 2018-08-15 NOTE — ED Notes (Addendum)
CRITICAL VALUE ALERT  Critical Value:  K 2.3  Date & Time Notied:  08/15/18 2212  Provider Notified: Dagoberto Ligas  Orders Received/Actions taken:

## 2018-08-15 NOTE — ED Notes (Signed)
EDEKG collected for pt

## 2018-08-15 NOTE — ED Triage Notes (Signed)
Pt reports increased weakness and confusion over the last 3-4 days. Also states that he cant keep anything down and has been vomiting for the last 3 days as well. Hx cirrhosis. A&Ox4, but lethargic.

## 2018-08-15 NOTE — ED Provider Notes (Signed)
[facility]-EMERGENCY DEPT Provider Note  CSN: [id number] Arrival date & time: 08/15/18  2007  History   Chief Complaint Chief Complaint  Patient presents with  . Emesis  . Weakness    HPI Most of the history is provided by the patient's family due to patient's current mental state.   Kenneth [name] is a 58 y.o. male with a medical history of cirrhosis with complications that include portal HTN and esophageal varices who presented to the ED for weakness and AMS x2 weeks. Denies any precipitating illness, trauma or medication changes prior to this. Family states that he frequently confuses days, appointment times and people. Also reported that patient has become more drowsy and has been vomiting more frequently. Patient's family helps with medication management and states he has taken his medications as prescribed, but is unsure how much is staying down as patient frequently vomits with meals.   Additional history obtained by medical chart. Patient seen by his GI provider, Dr. Amada Jupiter, on 08/11/18. He requires therapeutic paracentesis for his reaccumulating ascites.  Past Medical History:  Diagnosis Date  . Alcohol abuse   . Anxiety   . Arthritis    "never diagnosis"  . Blood clot in vein   . Cirrhosis (HCC)   . Depression   . Dyspnea    Due to ascites  . Esophageal varices (HCC) 09/02/2014  . H/O [facility] spotted fever    age 73 or 64  . Hernia, inguinal    right  . Hiatal hernia 09/02/2014  . Polysubstance abuse (HCC)   . Portal hypertension with esophageal varices (HCC) 09/02/2014  . Portal hypertensive gastropathy (HCC) 09/02/2014  . Thrombocytopenia (HCC) 09/02/2014  . Upper GI bleed 08/31/2014    Patient Active Problem List   Diagnosis Date Noted  . Peripheral neuropathy 08/02/2018  . Ascites due to alcoholic cirrhosis (HCC) 05/19/2018  . Depression 12/18/2016  . Testosterone deficiency in male 08/11/2016  . Inguinal hernia 02/28/2016    . Rectal bleed 01/24/2016  . Umbilical hernia 01/24/2016  . Tobacco abuse 01/24/2016  . Gynecomastia, male 01/24/2016  . Esophageal varices (HCC) 09/02/2014  . Portal hypertension with esophageal varices (HCC) 09/02/2014  . Hiatal hernia 09/02/2014  . Leukopenia 09/02/2014  . Thrombocytopenia (HCC) 09/02/2014  . Alcoholic cirrhosis of liver with ascites [facility])     Past Surgical History:  Procedure Laterality Date  . ESOPHAGOGASTRODUODENOSCOPY Left 09/01/2014   Procedure: ESOPHAGOGASTRODUODENOSCOPY (EGD);  Surgeon: Charna Elizabeth, MD;  Location: [facility] ENDOSCOPY;  Service: Endoscopy;  Laterality: Left;  . EYE SURGERY Left    age 9  . IR PARACENTESIS  03/16/2018  . IR PARACENTESIS  03/29/2018  . IR PARACENTESIS  04/19/2018  . IR PARACENTESIS  04/27/2018  . IR PARACENTESIS  05/10/2018  . IR PARACENTESIS  05/19/2018  . IR PARACENTESIS  06/11/2018  . IR PARACENTESIS  06/29/2018  . IR PARACENTESIS  07/16/2018  . IR PARACENTESIS  08/03/2018  . IR RADIOLOGIST EVAL & MGMT  05/13/2018  . IR RADIOLOGIST EVAL & MGMT  06/29/2018  . IR TIPS  05/19/2018  . RADIOLOGY WITH ANESTHESIA N/A 05/19/2018   Procedure: TIPS;  Surgeon: Simonne Come, MD;  Location[facility] OR;  Service: Radiology;  Laterality: N/A;        Home Medications    Prior to Admission medications   Medication Sig Start Date End Date Taking? Authorizing Provider  aMILoride (MIDAMOR) 5 MG tablet Take 2 tablets (10 mg total) by mouth daily. 08/11/18  Sherrilyn Rist, MD  buPROPion (WELLBUTRIN SR) 150 MG 12 hr tablet Take 1 tablet (150 mg total) by mouth 2 (two) times daily. 08/02/18   [name], MD  diclofenac sodium (VOLTAREN) 1 % GEL Apply 4 g topically 4 (four) times daily. 12/15/17   [name], MD  ergocalciferol (DRISDOL) 50000 units capsule Take 1 capsule (50,000 Units total) by mouth once a week. 08/03/18   [name], MD  FLUoxetine (PROZAC) 40 MG capsule Take 1 capsule (40 mg total) by mouth daily. 08/02/18   [name], MD   furosemide (LASIX) 40 MG tablet Take 1.5 tablets (60 mg total) by mouth daily. 07/21/18   Esterwood, Amy S, PA-C  gabapentin (NEURONTIN) 300 MG capsule Take 1 capsule (300 mg total) by mouth at bedtime. 08/02/18   [name], MD  lactulose (CHRONULAC) 10 GM/15ML solution Increase to 4 tablespoons by mouth twice daily. 07/21/18   Esterwood, Amy S, PA-C  midodrine (PROAMATINE) 5 MG tablet Take 1 tablet (5 mg total) by mouth 3 (three) times daily with meals. 07/21/18   Esterwood, Amy S, PA-C  Multiple Vitamin (MULTIVITAMIN WITH MINERALS) TABS tablet Take 1 tablet by mouth as needed.     [provider]  ondansetron (ZOFRAN) 4 MG tablet Take 1 tablet (4 mg total) by mouth every 8 (eight) hours as needed for nausea or vomiting. 08/11/18   Sherrilyn Rist, MD  Potassium Chloride ER 20 MEQ TBCR Take 40 mEq by mouth daily. 07/23/18   Esterwood, Amy S, PA-C    Family History Family History  Problem Relation Age of Onset  . Leukemia Father        acute myloid   . Hyperlipidemia Father   . Hypertension Father   . Colon cancer Neg Hx   . Esophageal cancer Neg Hx   . Pancreatic cancer Neg Hx   . Stomach cancer Neg Hx   . Liver disease Neg Hx     Social History Social History   Tobacco Use  . Smoking status: Current Every Day Smoker    Packs/day: 0.50    Years: 10.00    Pack years: 5.00    Types: Cigarettes  . Smokeless tobacco: Never Used  Substance Use Topics  . Alcohol use: No    Comment: no alcohol since 01/23/16  . Drug use: Yes    Frequency: 5.0 times per week    Types: Marijuana     Allergies   Latex   Review of Systems Review of Systems  Constitutional: Negative for chills and fever.  HENT: Negative.   Eyes: Negative.   Respiratory: Negative for shortness of breath.   Cardiovascular: Negative for chest pain.  Gastrointestinal: Positive for abdominal pain, nausea and vomiting. Negative for constipation and diarrhea.  Genitourinary: Negative.   Skin:  Positive for color change (Jaundice ).  Neurological: Positive for weakness. Negative for headaches.  Psychiatric/Behavioral: Positive for confusion and decreased concentration.     Physical Exam Updated Vital Signs BP 113/83 (BP Location: Left Arm)   Pulse 88   Temp 97.9 F (36.6 C)   Resp 16   SpO2 99%   Physical Exam  Constitutional: Vital signs are normal. He appears lethargic. He is easily aroused.  Chronic ill appearance.  Neck: Normal range of motion and full passive range of motion without pain. Neck supple.  Cardiovascular: Normal rate, regular rhythm, normal heart sounds, intact distal pulses and normal pulses.  No murmur heard. Pulmonary/Chest: Effort normal and breath  sounds normal.  Abdominal: Soft. Normal appearance. He exhibits ascites. There is no tenderness.  Musculoskeletal: Normal range of motion.  Neurological: He is easily aroused. He appears lethargic. He is disoriented. No cranial nerve deficit.  Oriented to person and place only. States it is 2016 and that today is Wednesday.  Skin: Skin is warm and intact. Capillary refill takes less than 2 seconds.  Jaundice.  Psychiatric: His speech is delayed. Cognition and memory are impaired. He exhibits abnormal recent memory. He is inattentive.  Nursing note and vitals reviewed.   ED Treatments / Results  Labs (all labs ordered are listed, but only abnormal results are displayed) Labs Reviewed  LIPASE, BLOOD  COMPREHENSIVE METABOLIC PANEL  CBC  URINALYSIS, ROUTINE W REFLEX MICROSCOPIC  AMMONIA    EKG None  Radiology No results found.  Procedures Procedures (including critical care time)  Medications Ordered in ED Medications - No data to display   Initial Impression / Assessment and Plan / ED Course  Triage vital signs and the nursing notes have been reviewed.  Pertinent labs & imaging results that were available during care of the patient were reviewed and considered in medical decision  making (see chart for details).  Patient presents afebrile with AMS and weakness x2 weeks. He has a history of cirrhosis which has decompensated this year and now requires lactulose and scheduled therapeutic paracentesis among other medications for management. There is reported compliance with medications, but unclear how effective that they have been as patient has had been having excessive vomiting recently. History and clinical presentation not consistent with an infectious etiology to complaints and is likely a further complication of patient's already decompensated cirrhosis. In addition to abdominal labs, will add on ammonia as well.  Clinical Course as of Aug 15 2300  Wynelle Link Aug 15, 2018  2209 Ammonia elevated at 2 which is somewhat expected given history and current presentation. CBC consistent with baseline.   [GM]  2212 RN alerted this provider that patient's potassium is 2.3. Will order EKG to evaluate for associated changes. Will begin with IV potassium replenishment.   [GM]  2235 Elevated ALP and AST which is expected given pt's history. Will consult [facility] for admission for AMS.   [GM]  2259 Case discussed with [facility] who will admit patient.   [GM]    Clinical Course User Index [GM] Lashuna Tamashiro, Sharyon Medicus, PA-C     Final Clinical Impressions(s) / ED Diagnoses  1. Altered Mental Status. Likely hepatic encephalopathy. Case discussed with [facility] who will admit.  Dispo: Admit.  Final diagnoses:  Encephalopathy    ED Discharge Orders    None        Windy Carina, [place] 08/15/18 2302    Rolan Bucco, MD 08/15/18 2321

## 2018-08-16 ENCOUNTER — Encounter (HOSPITAL_COMMUNITY): Payer: Self-pay | Admitting: Internal Medicine

## 2018-08-16 ENCOUNTER — Observation Stay (HOSPITAL_COMMUNITY): Payer: Medicaid Other

## 2018-08-16 DIAGNOSIS — F329 Major depressive disorder, single episode, unspecified: Secondary | ICD-10-CM | POA: Diagnosis present

## 2018-08-16 DIAGNOSIS — R278 Other lack of coordination: Secondary | ICD-10-CM | POA: Diagnosis present

## 2018-08-16 DIAGNOSIS — E876 Hypokalemia: Secondary | ICD-10-CM | POA: Diagnosis present

## 2018-08-16 DIAGNOSIS — I9589 Other hypotension: Secondary | ICD-10-CM | POA: Diagnosis present

## 2018-08-16 DIAGNOSIS — D6959 Other secondary thrombocytopenia: Secondary | ICD-10-CM | POA: Diagnosis present

## 2018-08-16 DIAGNOSIS — Z91040 Latex allergy status: Secondary | ICD-10-CM | POA: Diagnosis not present

## 2018-08-16 DIAGNOSIS — R64 Cachexia: Secondary | ICD-10-CM | POA: Diagnosis present

## 2018-08-16 DIAGNOSIS — Z6821 Body mass index (BMI) 21.0-21.9, adult: Secondary | ICD-10-CM | POA: Diagnosis not present

## 2018-08-16 DIAGNOSIS — I8500 Esophageal varices without bleeding: Secondary | ICD-10-CM | POA: Diagnosis present

## 2018-08-16 DIAGNOSIS — R1110 Vomiting, unspecified: Secondary | ICD-10-CM | POA: Diagnosis not present

## 2018-08-16 DIAGNOSIS — D649 Anemia, unspecified: Secondary | ICD-10-CM | POA: Diagnosis present

## 2018-08-16 DIAGNOSIS — K7041 Alcoholic hepatic failure with coma: Secondary | ICD-10-CM | POA: Diagnosis not present

## 2018-08-16 DIAGNOSIS — I498 Other specified cardiac arrhythmias: Secondary | ICD-10-CM

## 2018-08-16 DIAGNOSIS — K7290 Hepatic failure, unspecified without coma: Secondary | ICD-10-CM

## 2018-08-16 DIAGNOSIS — K7031 Alcoholic cirrhosis of liver with ascites: Secondary | ICD-10-CM | POA: Diagnosis present

## 2018-08-16 DIAGNOSIS — K766 Portal hypertension: Secondary | ICD-10-CM | POA: Diagnosis present

## 2018-08-16 DIAGNOSIS — R161 Splenomegaly, not elsewhere classified: Secondary | ICD-10-CM | POA: Diagnosis present

## 2018-08-16 DIAGNOSIS — F419 Anxiety disorder, unspecified: Secondary | ICD-10-CM | POA: Diagnosis present

## 2018-08-16 DIAGNOSIS — Z23 Encounter for immunization: Secondary | ICD-10-CM | POA: Diagnosis not present

## 2018-08-16 DIAGNOSIS — Z66 Do not resuscitate: Secondary | ICD-10-CM | POA: Diagnosis not present

## 2018-08-16 DIAGNOSIS — I8510 Secondary esophageal varices without bleeding: Secondary | ICD-10-CM | POA: Diagnosis present

## 2018-08-16 DIAGNOSIS — F17210 Nicotine dependence, cigarettes, uncomplicated: Secondary | ICD-10-CM | POA: Diagnosis present

## 2018-08-16 DIAGNOSIS — Z806 Family history of leukemia: Secondary | ICD-10-CM | POA: Diagnosis not present

## 2018-08-16 DIAGNOSIS — E440 Moderate protein-calorie malnutrition: Secondary | ICD-10-CM | POA: Diagnosis present

## 2018-08-16 DIAGNOSIS — Z8249 Family history of ischemic heart disease and other diseases of the circulatory system: Secondary | ICD-10-CM | POA: Diagnosis not present

## 2018-08-16 DIAGNOSIS — Z8349 Family history of other endocrine, nutritional and metabolic diseases: Secondary | ICD-10-CM | POA: Diagnosis not present

## 2018-08-16 DIAGNOSIS — G9340 Encephalopathy, unspecified: Secondary | ICD-10-CM

## 2018-08-16 LAB — BASIC METABOLIC PANEL
ANION GAP: 8 (ref 5–15)
Anion gap: 6 (ref 5–15)
BUN: 7 mg/dL (ref 6–20)
BUN: 8 mg/dL (ref 6–20)
CHLORIDE: 106 mmol/L (ref 98–111)
CHLORIDE: 107 mmol/L (ref 98–111)
CO2: 27 mmol/L (ref 22–32)
CO2: 27 mmol/L (ref 22–32)
CREATININE: 0.73 mg/dL (ref 0.61–1.24)
Calcium: 7.8 mg/dL — ABNORMAL LOW (ref 8.9–10.3)
Calcium: 8.1 mg/dL — ABNORMAL LOW (ref 8.9–10.3)
Creatinine, Ser: 0.58 mg/dL — ABNORMAL LOW (ref 0.61–1.24)
GFR calc Af Amer: >60 mL/min (ref >60)
GFR calc non Af Amer: >60 mL/min (ref >60)
GLUCOSE: 76 mg/dL (ref 70–99)
Glucose, Bld: 87 mg/dL (ref 70–99)
POTASSIUM: 4.5 mmol/L (ref 3.5–5.1)
Potassium: 3.1 mmol/L — ABNORMAL LOW (ref 3.5–5.1)
SODIUM: 140 mmol/L (ref 135–145)
Sodium: 141 mmol/L (ref 135–145)

## 2018-08-16 LAB — HEPATIC FUNCTION PANEL
ALT: 36 U/L (ref 0–44)
AST: 65 U/L — AB (ref 15–41)
Albumin: 2.2 g/dL — ABNORMAL LOW (ref 3.5–5.0)
Alkaline Phosphatase: 126 U/L (ref 38–126)
BILIRUBIN DIRECT: 1.6 mg/dL — AB (ref 0.0–0.2)
BILIRUBIN INDIRECT: 3 mg/dL — AB (ref 0.3–0.9)
Total Bilirubin: 4.6 mg/dL — ABNORMAL HIGH (ref 0.3–1.2)
Total Protein: 5.3 g/dL — ABNORMAL LOW (ref 6.5–8.1)

## 2018-08-16 LAB — CBC
HCT: 34.9 % — ABNORMAL LOW (ref 39.0–52.0)
HEMOGLOBIN: 11.7 g/dL — AB (ref 13.0–17.0)
MCH: 32 pg (ref 26.0–34.0)
MCHC: 33.5 g/dL (ref 30.0–36.0)
MCV: 95.4 fL (ref 80.0–100.0)
Platelets: 71 10*3/uL — ABNORMAL LOW (ref 150–400)
RBC: 3.66 MIL/uL — AB (ref 4.22–5.81)
RDW: 15.9 % — ABNORMAL HIGH (ref 11.5–15.5)
WBC: 5.6 10*3/uL (ref 4.0–10.5)
nRBC: 0 % (ref 0.0–0.2)

## 2018-08-16 LAB — LIPASE, BLOOD: LIPASE: 63 U/L — AB (ref 11–51)

## 2018-08-16 LAB — MAGNESIUM
MAGNESIUM: 1.4 mg/dL — AB (ref 1.7–2.4)
Magnesium: 1.8 mg/dL (ref 1.7–2.4)

## 2018-08-16 MED ORDER — ENSURE ENLIVE PO LIQD
237.0000 mL | Freq: Two times a day (BID) | ORAL | Status: DC
  Administered 2018-08-16 – 2018-08-25 (×13): 237 mL via ORAL

## 2018-08-16 MED ORDER — GABAPENTIN 300 MG PO CAPS
300.0000 mg | ORAL_CAPSULE | Freq: Every day | ORAL | Status: DC
  Administered 2018-08-17 – 2018-08-25 (×9): 300 mg via ORAL
  Filled 2018-08-16 (×9): qty 1

## 2018-08-16 MED ORDER — LORAZEPAM 2 MG/ML IJ SOLN
0.5000 mg | Freq: Once | INTRAMUSCULAR | Status: AC
  Administered 2018-08-16: 0.5 mg via INTRAVENOUS
  Filled 2018-08-16: qty 1

## 2018-08-16 MED ORDER — SPIRONOLACTONE 25 MG PO TABS
100.0000 mg | ORAL_TABLET | Freq: Every day | ORAL | Status: DC
  Administered 2018-08-16 – 2018-08-26 (×11): 100 mg via ORAL
  Filled 2018-08-16 (×11): qty 4

## 2018-08-16 MED ORDER — POTASSIUM CHLORIDE CRYS ER 20 MEQ PO TBCR
40.0000 meq | EXTENDED_RELEASE_TABLET | Freq: Once | ORAL | Status: AC
  Administered 2018-08-16: 40 meq via ORAL
  Filled 2018-08-16: qty 2

## 2018-08-16 MED ORDER — FLUOXETINE HCL 20 MG PO CAPS
40.0000 mg | ORAL_CAPSULE | Freq: Every day | ORAL | Status: DC
  Administered 2018-08-16 – 2018-08-18 (×3): 40 mg via ORAL
  Filled 2018-08-16 (×4): qty 2

## 2018-08-16 MED ORDER — MIDODRINE HCL 5 MG PO TABS
5.0000 mg | ORAL_TABLET | Freq: Three times a day (TID) | ORAL | Status: DC
  Administered 2018-08-16 – 2018-08-26 (×23): 5 mg via ORAL
  Filled 2018-08-16 (×33): qty 1

## 2018-08-16 MED ORDER — POTASSIUM CHLORIDE 10 MEQ/100ML IV SOLN
10.0000 meq | INTRAVENOUS | Status: AC
  Administered 2018-08-16 (×4): 10 meq via INTRAVENOUS
  Filled 2018-08-16 (×4): qty 100

## 2018-08-16 MED ORDER — MAGNESIUM SULFATE 2 GM/50ML IV SOLN
2.0000 g | Freq: Once | INTRAVENOUS | Status: AC
  Administered 2018-08-16: 2 g via INTRAVENOUS
  Filled 2018-08-16: qty 50

## 2018-08-16 MED ORDER — INFLUENZA VAC SPLIT QUAD 0.5 ML IM SUSY
0.5000 mL | PREFILLED_SYRINGE | INTRAMUSCULAR | Status: AC
  Administered 2018-08-17: 0.5 mL via INTRAMUSCULAR
  Filled 2018-08-16: qty 0.5

## 2018-08-16 MED ORDER — SODIUM CHLORIDE 0.9 % IV SOLN
2.0000 g | Freq: Every day | INTRAVENOUS | Status: DC
  Administered 2018-08-16: 2 g via INTRAVENOUS
  Filled 2018-08-16: qty 2

## 2018-08-16 MED ORDER — BUPROPION HCL ER (SR) 150 MG PO TB12
150.0000 mg | ORAL_TABLET | Freq: Two times a day (BID) | ORAL | Status: DC
  Administered 2018-08-17: 150 mg via ORAL
  Filled 2018-08-16: qty 1

## 2018-08-16 MED ORDER — POTASSIUM CHLORIDE CRYS ER 20 MEQ PO TBCR
40.0000 meq | EXTENDED_RELEASE_TABLET | ORAL | Status: AC
  Administered 2018-08-16 (×2): 40 meq via ORAL
  Filled 2018-08-16 (×2): qty 2

## 2018-08-16 MED ORDER — LACTULOSE 10 GM/15ML PO SOLN
20.0000 g | Freq: Three times a day (TID) | ORAL | Status: DC
  Administered 2018-08-16 – 2018-08-18 (×9): 20 g via ORAL
  Filled 2018-08-16 (×10): qty 30

## 2018-08-16 MED ORDER — ONDANSETRON HCL 4 MG PO TABS: 4.0000 mg | ORAL_TABLET | Freq: Four times a day (QID) | ORAL | Status: DC | PRN

## 2018-08-16 MED ORDER — ADULT MULTIVITAMIN W/MINERALS CH
1.0000 | ORAL_TABLET | Freq: Every day | ORAL | Status: DC
  Administered 2018-08-16 – 2018-08-26 (×11): 1 via ORAL
  Filled 2018-08-16 (×11): qty 1

## 2018-08-16 MED ORDER — ONDANSETRON HCL 4 MG/2ML IJ SOLN
4.0000 mg | Freq: Four times a day (QID) | INTRAMUSCULAR | Status: DC | PRN
  Administered 2018-08-16 – 2018-08-20 (×5): 4 mg via INTRAVENOUS
  Filled 2018-08-16 (×5): qty 2

## 2018-08-16 NOTE — Progress Notes (Signed)
Initial Nutrition Assessment  DOCUMENTATION CODES:   Non-severe (moderate) malnutrition in context of chronic illness  INTERVENTION:   -Continue Ensure Enlive po BID, each supplement provides 350 kcal and 20 grams of protein -Provide Multivitamin with minerals daily  Recommend checking Vitamin D serum levels given cirrhosis and low Vitamin D levels (16.4ng/mL found on 10/7)  NUTRITION DIAGNOSIS:   Moderate Malnutrition related to chronic illness(ETOH cirrhosis) as evidenced by percent weight loss, moderate fat depletion, severe muscle depletion.  GOAL:   Patient will meet greater than or equal to 90% of their needs  MONITOR:   PO intake, Supplement acceptance, Weight trends, Labs, I & O's  REASON FOR ASSESSMENT:   Malnutrition Screening Tool   ASSESSMENT:   57 y.o. male with history of hepatic cirrhosis status post TIPS procedure with history of esophageal varices who has had recent paracentesis done 2 weeks ago was brought to the ER after patient's family found patient was increasingly confused.  Patient also states that over the past 4 days patient felt that his abdomen is getting more distended and he has has episodes of nausea and vomiting.   Patient asleep upon visit, woke up to speak with RD but was very lethargic and kept yawning. Pt was unable to provide detailed nutrition history given lethargic state. Has meal tray of a bowl of mashed potatoes and a Coke sitting in front of him. Gave patient the cup of Coke to drink but he was unable to hold the cup. Pt states he is just too tired to hold it. Noted that pt received a dose of Ativan earlier this morning.  Pt is willing to try Ensure supplements in between meals.  Per weight records, pt has lost 18 lb since 8/14 (12% wt loss x 2 months, significant for time frame). Pt is s/p multiple paracentesis procedures this year d/t ascites. Some weight fluctuations may be a result from those procedures and fluid accumulation. Pt  states his UBW is 142 lb. Weight is still decreased from UBW.  Medications: K-DUR tablet every 4 hours, KCl infusion, Mg sulfate infusion, IV Zofran PRN Labs reviewed: Low K Mg WNL Low Vitamin D levels  NUTRITION - FOCUSED PHYSICAL EXAM:    Most Recent Value  Orbital Region  No depletion  Upper Arm Region  Moderate depletion  Thoracic and Lumbar Region  Unable to assess  Buccal Region  Mild depletion  Temple Region  Moderate depletion  Clavicle Bone Region  Severe depletion  Clavicle and Acromion Bone Region  Severe depletion  Scapular Bone Region  Unable to assess  Dorsal Hand  Mild depletion  Patellar Region  Unable to assess  Anterior Thigh Region  Unable to assess  Posterior Calf Region  Unable to assess  Edema (RD Assessment)  None       Diet Order:   Diet Order            DIET SOFT Room service appropriate? Yes; Fluid consistency: Thin; Fluid restriction: 1200 mL Fluid  Diet effective now              EDUCATION NEEDS:   Not appropriate for education at this time  Skin:  Skin Assessment: Reviewed RN Assessment  Last BM:  PTA  Height:   Ht Readings from Last 1 Encounters:  08/15/18 5\' 7"  (1.702 m)    Weight:   Wt Readings from Last 1 Encounters:  08/15/18 61.4 kg    Ideal Body Weight:  67.3 kg  BMI:  Body mass  index is 21.2 kg/m.  Estimated Nutritional Needs:   Kcal:  1850-2050  Protein:  90-100g  Fluid:  Per MD  Tilda Franco, MS, RD, LDN [facility] Inpatient Clinical Dietitian Pager: [phone] After Hours Pager: [phone]

## 2018-08-16 NOTE — Consult Note (Addendum)
Cardiology Consultation:   Patient ID: Kenneth [name]; [id number]; 18-Feb-1961   Admit date: 08/15/2018 Date of Consult: 08/16/2018  Primary Care Provider: [name], MD Primary Cardiologist: New, Dr Delton See Primary Electrophysiologist:  none   Patient Profile:   Kenneth [name] is a 57 y.o. male with a hx of ETOH abuse, portal HTN w/ varices, polysubs abuse, cirrhosis s/p TIPS and paracentesis, anxiety, who is being seen today for the evaluation of arrhythmia at the request of Dr Butler Denmark.   History of Present Illness:   Kenneth [name] was admitted 10/20 with increasing confusion, abdominal distention, ammonia 79, K+ 2.3 initially, now 3.1, albumin 2.6>>2.2, some LFTs abnl, magnesium 1.8.  ECG 12/15/2017 was sinus bradycardia, heart rate 53.  Today, ECG shows accelerated junctional rhythm with a heart rate of 74, cardiology asked to evaluate.  Kenneth [name] denies chest pain.  He has never had chest pain and says he has never had shortness of breath.  He says he feels his heart beat abnormally all the time.  However, he does not state that it is doing it now.  His heart has never made him pass out.  He is not aware of any symptoms that come from his heart.  He feels better than he did on admission.   Past Medical History:  Diagnosis Date  . Alcohol abuse   . Anxiety   . Arthritis    "never diagnosis"  . Blood clot in vein   . Cirrhosis (HCC)   . Depression   . Dyspnea    Due to ascites  . Esophageal varices (HCC) 09/02/2014  . H/O [facility] spotted fever    age 40 or 59  . Hernia, inguinal    right  . Hiatal hernia 09/02/2014  . Polysubstance abuse (HCC)   . Portal hypertension with esophageal varices (HCC) 09/02/2014  . Portal hypertensive gastropathy (HCC) 09/02/2014  . Thrombocytopenia (HCC) 09/02/2014  . Upper GI bleed 08/31/2014    Past Surgical History:  Procedure Laterality Date  . ESOPHAGOGASTRODUODENOSCOPY Left 09/01/2014   Procedure: ESOPHAGOGASTRODUODENOSCOPY  (EGD);  Surgeon: Charna Elizabeth, MD;  Location: [facility] END[facility];  Service: Endoscopy;  Laterality: Left;  . EYE SURGERY Left    age 63  . IR PARACENTESIS  03/16/2018  . IR PARACENTESIS  03/29/2018  . IR PARACENTESIS  04/19/2018  . IR PARACENTESIS  04/27/2018  . IR PARACENTESIS  05/10/2018  . IR PARACENTESIS  05/19/2018  . IR PARACENTESIS  06/11/2018  . IR PARACENTESIS  06/29/2018  . IR PARACENTESIS  07/16/2018  . IR PARACENTESIS  08/03/2018  . IR RADIOLOGIST EVAL & MGMT  05/13/2018  . IR RADIOLOGIST EVAL & MGMT  06/29/2018  . IR TIPS  05/19/2018  . RADIOLOGY WITH ANESTHESIA N/A 05/19/2018   Procedure: TIPS;  Surgeon: Simonne Come, MD;  [facility];  Service: Radiology;  Laterality: N/A;     Prior to Admission medications   Medication Sig Start Date End Date Taking? Authorizing Provider  aMILoride (MIDAMOR) 5 MG tablet Take 2 tablets (10 mg total) by mouth daily. 08/11/18  Yes Danis, Andreas Blower, MD  buPROPion (WELLBUTRIN SR) 150 MG 12 hr tablet Take 1 tablet (150 mg total) by mouth 2 (two) times daily. 08/02/18  Yes [name], MD  ergocalciferol (DRISDOL) 50000 units capsule Take 1 capsule (50,000 Units total) by mouth once a week. 08/03/18  Yes [name], MD  FLUoxetine (PROZAC) 40 MG capsule Take 1 capsule (40 mg total) by mouth daily. 08/02/18  Yes [name], MD  furosemide (LASIX) 40 MG tablet Take 1.5 tablets (60 mg total) by mouth daily. 07/21/18  Yes Esterwood, Amy S, PA-C  gabapentin (NEURONTIN) 300 MG capsule Take 1 capsule (300 mg total) by mouth at bedtime. 08/02/18  Yes [name], MD  lactulose (CHRONULAC) 10 GM/15ML solution Increase to 4 tablespoons by mouth twice daily. 07/21/18  Yes Esterwood, Amy S, PA-C  midodrine (PROAMATINE) 5 MG tablet Take 1 tablet (5 mg total) by mouth 3 (three) times daily with meals. 07/21/18  Yes Esterwood, Amy S, PA-C  ondansetron (ZOFRAN) 4 MG tablet Take 1 tablet (4 mg total) by mouth every 8 (eight) hours as needed for nausea or vomiting.  08/11/18  Yes Danis, Starr Lake III, MD  diclofenac sodium (VOLTAREN) 1 % GEL Apply 4 g topically 4 (four) times daily. Patient not taking: Reported on 08/15/2018 12/15/17   [name], MD  Potassium Chloride ER 20 MEQ TBCR Take 40 mEq by mouth daily. Patient not taking: Reported on 08/15/2018 07/23/18   Sammuel Cooper, PA-C    Inpatient Medications: Scheduled Meds: . [START ON 08/17/2018] buPROPion  150 mg Oral BID  . feeding supplement (ENSURE ENLIVE)  237 mL Oral BID BM  . FLUoxetine  40 mg Oral Daily  . [START ON 08/17/2018] gabapentin  300 mg Oral QHS  . [START ON 08/17/2018] Influenza vac split quadrivalent PF  0.5 mL Intramuscular Tomorrow-1000  . lactulose  20 g Oral TID  . midodrine  5 mg Oral TID WC  . potassium chloride  40 mEq Oral Q4H  . spironolactone  100 mg Oral Daily   Continuous Infusions: . magnesium sulfate 1 - 4 g bolus IVPB    . potassium chloride     PRN Meds: ondansetron **OR** ondansetron (ZOFRAN) IV  Allergies:    Allergies  Allergen Reactions  . Latex Swelling and Rash    Social History:   Social History   Socioeconomic History  . Marital status: Single    Spouse name: Not on file  . Number of children: 1  . Years of education: Not on file  . Highest education level: Not on file  Occupational History  . Occupation: Administrator  Social Needs  . Financial resource strain: Not on file  . Food insecurity:    Worry: Not on file    Inability: Not on file  . Transportation needs:    Medical: Not on file    Non-medical: Not on file  Tobacco Use  . Smoking status: Current Every Day Smoker    Packs/day: 0.50    Years: 10.00    Pack years: 5.00    Types: Cigarettes  . Smokeless tobacco: Never Used  Substance and Sexual Activity  . Alcohol use: No    Comment: no alcohol since 01/23/16  . Drug use: Yes    Frequency: 5.0 times per week    Types: Marijuana  . Sexual activity: Not on file  Lifestyle  . Physical activity:    Days per week:  Not on file    Minutes per session: Not on file  . Stress: Not on file  Relationships  . Social connections:    Talks on phone: Not on file    Gets together: Not on file    Attends religious service: Not on file    Active member of club or organization: Not on file    Attends meetings of clubs or organizations: Not on file    Relationship status: Not  on file  . Intimate partner violence:    Fear of current or ex partner: Not on file    Emotionally abused: Not on file    Physically abused: Not on file    Forced sexual activity: Not on file  Other Topics Concern  . Not on file  Social History Narrative  . Not on file    Family History:   Family History  Problem Relation Age of Onset  . Leukemia Father        acute myloid   . Hyperlipidemia Father   . Hypertension Father   . Colon cancer Neg Hx   . Esophageal cancer Neg Hx   . Pancreatic cancer Neg Hx   . Stomach cancer Neg Hx   . Liver disease Neg Hx    Family Status:  Family Status  Relation Name Status  . Father  Deceased  . Neg Hx  (Not Specified)    ROS:  Please see the history of present illness.  All other ROS reviewed and negative.     Physical Exam/Data:   Vitals:   08/15/18 2318 08/15/18 2352 08/15/18 2353 08/16/18 0518  BP: 114/79  105/71 107/68  Pulse: 79  74 78  Resp: 19  20 18   Temp:   98.9 F (37.2 C) 98 F (36.7 C)  TempSrc:   Oral Oral  SpO2: 98%  97% 96%  Weight:  61.4 kg    Height:  5\' 7"  (1.702 m)      Intake/Output Summary (Last 24 hours) at 08/16/2018 0951 Last data filed at 08/16/2018 0600 Gross per 24 hour  Intake 1020.16 ml  Output -  Net 1020.16 ml   Filed Weights   08/15/18 2352  Weight: 61.4 kg   Body mass index is 21.2 kg/m.  General:  Well nourished, well developed, in no acute distress HEENT: normal Lymph: no adenopathy Neck: no JVD Endocrine:  No thryomegaly Vascular: No carotid bruits; 4/4 extremity pulses 2+, without bruits  Cardiac:  normal S1, S2; RRR;  2/6 murmur  Lungs:  clear to auscultation bilaterally, no wheezing, rhonchi or rales  Abd: soft, slightly tender, no hepatomegaly  Ext: no edema Musculoskeletal:  No deformities, BUE and BLE strength normal and equal Skin: warm and dry  Neuro:  CNs 2-12 intact, no focal abnormalities noted Psych:  Normal affect   EKG:  The EKG was personally reviewed and demonstrates: Accelerated junctional rhythm, heart rate 74 Telemetry:  Telemetry was personally reviewed and demonstrates: Accelerated junctional rhythm since admission, no other ectopy  Relevant CV Studies:  ECHO: 04/27/2018 - Left ventricle: The cavity size was normal. Wall thickness was   normal. Systolic function was normal. The estimated ejection   fraction was in the range of 60% to 65%. Wall motion was normal;   there were no regional wall motion abnormalities. Left   ventricular diastolic function parameters were normal for the   patient&'s age. - Pulmonary arteries: PA peak pressure: 31 mm Hg (S).  Laboratory Data:  Chemistry Recent Labs  Lab 08/11/18 1418 08/15/18 2029 08/16/18 0655  NA 141 139 141  K 3.7 2.3* 3.1*  CL 105 100 106  CO2 29 29 27   GLUCOSE 99 108* 76  BUN 7 7 7   CREATININE 0.83 0.78 0.58*  CALCIUM 8.4 8.2* 7.8*  GFRNONAA  --  >60 >60  GFRAA  --  >60 >60  ANIONGAP  --  10 8    Lab Results  Component Value Date   ALT  36 08/16/2018   AST 65 (H) 08/16/2018   ALKPHOS 126 08/16/2018   BILITOT 4.6 (H) 08/16/2018   Hematology Recent Labs  Lab 08/11/18 1418 08/15/18 2029 08/16/18 0655  WBC 5.5 6.3 5.6  RBC 3.72* 4.26 3.66*  HGB 12.3* 13.5 11.7*  HCT 35.2* 40.6 34.9*  MCV 94.8 95.3 95.4  MCH  --  31.7 32.0  MCHC 34.9 33.3 33.5  RDW 16.4* 15.9* 15.9*  PLT 81.0* 91* 71*   Cardiac EnzymesNo results for input(s): TROPONINI in the last 168 hours. No results for input(s): TROPIPOC in the last 168 hours.   TSH:  Lab Results  Component Value Date   TSH 1.680 08/02/2018   Magnesium:    Magnesium  Date Value Ref Range Status  08/16/2018 1.8 1.7 - 2.4 mg/dL Final    Comment:    Performed at [facility], [address], [place], [place] [address]     Radiology/Studies:  Dg Abd Acute W/chest  Result Date: 08/16/2018 CLINICAL DATA:  Abdominal pain. EXAM: DG ABDOMEN ACUTE W/ 1V CHEST COMPARISON:  CT 04/26/2018 FINDINGS: Linear subsegmental atelectasis at the left lung base. No confluent consolidation. No pleural fluid. No free intra-abdominal air. No bowel dilatation to suggest obstruction. Air-filled small bowel in the right abdomen is nondilated and appears similar to prior CT. Moderate colonic stool burden with stool in the rectum. Calcified gallstones. TIPS in the right upper quadrant. Pelvic calcifications consistent with phleboliths. IMPRESSION: 1. No bowel obstruction or free air.  Moderate colonic stool burden. 2. Gallstones. 3. Subsegmental atelectasis at the left lung base. Electronically Signed   By: Narda Rutherford M.D.   On: 08/16/2018 06:06    Assessment and Plan:   1. Arrhythmia -He was in an accelerated junctional rhythm on admission, his rate has been stable -Previously, he was in sinus bradycardia, heart rate 53. - He is on his home dose of midodrine and being treated for hepatic encephalopathy and electrolyte abnormalities - We will continue to follow on telemetry, but do not feel any further work-up is indicated as a recent echo showed a normal EF  Otherwise, per IM Principal Problem:   Hepatic encephalopathy (HCC) Active Problems:   Portal hypertension with esophageal varices (HCC)   Thrombocytopenia (HCC)   Alcoholic cirrhosis of liver with ascites (HCC)   Hypokalemia   Hypomagnesemia  For questions or updates, please contact [facility] Please consult [email or web address] for contact info under Cardiology/STEMI.   Signed, Theodore Demark, PA-C  08/16/2018 9:51 AM   The patient was seen, examined and discussed with Theodore Demark, PA-C and I agree with the above.   57 y.o. male with a hx of ETOH abuse, portal HTN w/ varices, polysubs abuse, cirrhosis s/p TIPS and paracentesis, anxiety, who is being seen today for the evaluation of accelerated junctional rhythm.   He was admitted 10/20 with increasing confusion, abdominal distention, ammonia 79, K+ 2.3 initially, now 3.1, albumin 2.6>>2.2, some LFTs abnl, magnesium 1.8.  Potassium magnesium being replaced.  Original ECG 12/15/2017 showed sinus bradycardia, heart rate 53.  Today, ECG shows accelerated junctional rhythm with a heart rate of 74, the patient is completely asymptomatic.  Echocardiogram from July of this year showed normal systolic and diastolic LV function, I would recommend to continue replacing potassium and magnesium, avoid any AV nodal blocking agent given underlying bradycardia, like of symptoms, hypotension. No further management or work-up is needed.  Tobias Alexander, MD 08/16/2018

## 2018-08-16 NOTE — Progress Notes (Signed)
CSW consulted for "trouble getting medications". CSW notified patient's RNCM. CSW signing off, no other needs identified at this time.  Celso Sickle, [place] Clinical Social Worker [facility] Cell#: [phone]

## 2018-08-16 NOTE — H&P (Addendum)
History and Physical    Kenneth [name]:[id number] DOB: January 09, 1961 DOA: 08/15/2018  PCP: [name], MD  Patient coming from: Home.  Chief Complaint: Weakness confusion abdominal discomfort.  HPI: Kenneth [name] is a 57 y.o. male with history of hepatic cirrhosis status post TIPS procedure with history of esophageal varices who has had recent paracentesis done 2 weeks ago was brought to the ER after patient's family found patient was increasingly confused.  Patient also states that over the last 4 days patient felt that his abdomen is getting more distended with discomfort no definite pain but increasing distention is making it uncomfortable.  Last 2 days patient had nausea vomiting denies any diarrhea.  ED Course: In the ER patient was oriented to his name and place ammonia levels were around 79.  Abdomen appears distended.  Denies any chest pain or shortness of breath.  Patient was given fluid in the ER.  Admitted for hepatic encephalopathy and abdominal distention.  Labs also show markedly hypokalemic.  For which patient is getting potassium replacement.  Review of Systems: As per HPI, rest all negative.   Past Medical History:  Diagnosis Date  . Alcohol abuse   . Anxiety   . Arthritis    "never diagnosis"  . Blood clot in vein   . Cirrhosis (HCC)   . Depression   . Dyspnea    Due to ascites  . Esophageal varices (HCC) 09/02/2014  . H/O [facility] spotted fever    age 41 or 48  . Hernia, inguinal    right  . Hiatal hernia 09/02/2014  . Polysubstance abuse (HCC)   . Portal hypertension with esophageal varices (HCC) 09/02/2014  . Portal hypertensive gastropathy (HCC) 09/02/2014  . Thrombocytopenia (HCC) 09/02/2014  . Upper GI bleed 08/31/2014    Past Surgical History:  Procedure Laterality Date  . ESOPHAGOGASTRODUODENOSCOPY Left 09/01/2014   Procedure: ESOPHAGOGASTRODUODENOSCOPY (EGD);  Surgeon: Charna Elizabeth, MD;  Location: [facility];  Service: Endoscopy;   Laterality: Left;  . EYE SURGERY Left    age 70  . IR PARACENTESIS  03/16/2018  . IR PARACENTESIS  03/29/2018  . IR PARACENTESIS  04/19/2018  . IR PARACENTESIS  04/27/2018  . IR PARACENTESIS  05/10/2018  . IR PARACENTESIS  05/19/2018  . IR PARACENTESIS  06/11/2018  . IR PARACENTESIS  06/29/2018  . IR PARACENTESIS  07/16/2018  . IR PARACENTESIS  08/03/2018  . IR RADIOLOGIST EVAL & MGMT  05/13/2018  . IR RADIOLOGIST EVAL & MGMT  06/29/2018  . IR TIPS  05/19/2018  . RADIOLOGY WITH ANESTHESIA N/A 05/19/2018   Procedure: TIPS;  Surgeon: Simonne Come, MD;  Location: [facility] OR;  Service: Radiology;  Laterality: N/A;     reports that he has been smoking cigarettes. He has a 5.00 pack-year smoking history. He has never used smokeless tobacco. He reports that he has current or past drug history. Drug: Marijuana. Frequency: 5.00 times per week. He reports that he does not drink alcohol.  Allergies  Allergen Reactions  . Latex Swelling and Rash    Family History  Problem Relation Age of Onset  . Leukemia Father        acute myloid   . Hyperlipidemia Father   . Hypertension Father   . Colon cancer Neg Hx   . Esophageal cancer Neg Hx   . Pancreatic cancer Neg Hx   . Stomach cancer Neg Hx   . Liver disease Neg Hx     Prior to Admission  medications   Medication Sig Start Date End Date Taking? Authorizing Provider  aMILoride (MIDAMOR) 5 MG tablet Take 2 tablets (10 mg total) by mouth daily. 08/11/18  Yes Danis, Andreas Blower, MD  buPROPion (WELLBUTRIN SR) 150 MG 12 hr tablet Take 1 tablet (150 mg total) by mouth 2 (two) times daily. 08/02/18  Yes [name], MD  ergocalciferol (DRISDOL) 50000 units capsule Take 1 capsule (50,000 Units total) by mouth once a week. 08/03/18  Yes [name], MD  FLUoxetine (PROZAC) 40 MG capsule Take 1 capsule (40 mg total) by mouth daily. 08/02/18  Yes [name], MD  furosemide (LASIX) 40 MG tablet Take 1.5 tablets (60 mg total) by mouth daily. 07/21/18  Yes Esterwood,  Amy S, PA-C  gabapentin (NEURONTIN) 300 MG capsule Take 1 capsule (300 mg total) by mouth at bedtime. 08/02/18  Yes [name], MD  lactulose (CHRONULAC) 10 GM/15ML solution Increase to 4 tablespoons by mouth twice daily. 07/21/18  Yes Esterwood, Amy S, PA-C  midodrine (PROAMATINE) 5 MG tablet Take 1 tablet (5 mg total) by mouth 3 (three) times daily with meals. 07/21/18  Yes Esterwood, Amy S, PA-C  ondansetron (ZOFRAN) 4 MG tablet Take 1 tablet (4 mg total) by mouth every 8 (eight) hours as needed for nausea or vomiting. 08/11/18  Yes Danis, Starr Lake III, MD  diclofenac sodium (VOLTAREN) 1 % GEL Apply 4 g topically 4 (four) times daily. Patient not taking: Reported on 08/15/2018 12/15/17   [name], MD  Potassium Chloride ER 20 MEQ TBCR Take 40 mEq by mouth daily. Patient not taking: Reported on 08/15/2018 07/23/18   Sammuel Cooper, PA-C    Physical Exam: Vitals:   08/15/18 2300 08/15/18 2318 08/15/18 2352 08/15/18 2353  BP: 114/79 114/79  105/71  Pulse: 76 79  74  Resp: 19 19  20   Temp:    98.9 F (37.2 C)  TempSrc:    Oral  SpO2: 98% 98%  97%  Weight:   61.4 kg   Height:   5\' 7"  (1.702 m)       Constitutional: Moderately built and nourished. Vitals:   08/15/18 2300 08/15/18 2318 08/15/18 2352 08/15/18 2353  BP: 114/79 114/79  105/71  Pulse: 76 79  74  Resp: 19 19  20   Temp:    98.9 F (37.2 C)  TempSrc:    Oral  SpO2: 98% 98%  97%  Weight:   61.4 kg   Height:   5\' 7"  (1.702 m)    Eyes: Anicteric no pallor. ENMT: No discharge from the ears eyes nose or mouth. Neck: No neck rigidity no mass felt.  No JVD appreciated. Respiratory: No rhonchi or crepitations. Cardiovascular: S1-S2 heard no murmurs appreciated. Abdomen: Distended nontender bowel sounds present. Musculoskeletal: No edema. Skin: No rash. Neurologic: Alert awake oriented to time place and person.  Moves all extremities. Psychiatric: Appears normal.   Labs on Admission: I have personally reviewed  following labs and imaging studies  CBC: Recent Labs  Lab 08/11/18 1418 08/15/18 2029  WBC 5.5 6.3  NEUTROABS 3.6  --   HGB 12.3* 13.5  HCT 35.2* 40.6  MCV 94.8 95.3  PLT 81.0* 91*   Basic Metabolic Panel: Recent Labs  Lab 08/11/18 1418 08/15/18 2029 08/15/18 2224  NA 141 139  --   K 3.7 2.3*  --   CL 105 100  --   CO2 29 29  --   GLUCOSE 99 108*  --   BUN  7 7  --   CREATININE 0.83 0.78  --   CALCIUM 8.4 8.2*  --   MG  --   --  1.4*   GFR: Estimated Creatinine Clearance: 88.5 mL/min (by C-G formula based on SCr of 0.78 mg/dL). Liver Function Tests: Recent Labs  Lab 08/11/18 1418 08/15/18 2029  AST 54* 75*  ALT 33 39  ALKPHOS 155* 154*  BILITOT 5.4* 5.9*  PROT 5.9* 6.4*  ALBUMIN 2.7* 2.6*   Recent Labs  Lab 08/15/18 2029  LIPASE 60*   Recent Labs  Lab 08/15/18 2039  AMMONIA 79*   Coagulation Profile: Recent Labs  Lab 08/11/18 1418  INR 2.0*   Cardiac Enzymes: No results for input(s): CKTOTAL, CKMB, CKMBINDEX, TROPONINI in the last 168 hours. BNP (last 3 results) No results for input(s): PROBNP in the last 8760 hours. HbA1C: No results for input(s): HGBA1C in the last 72 hours. CBG: No results for input(s): GLUCAP in the last 168 hours. Lipid Profile: No results for input(s): CHOL, HDL, LDLCALC, TRIG, CHOLHDL, LDLDIRECT in the last 72 hours. Thyroid Function Tests: No results for input(s): TSH, T4TOTAL, FREET4, T3FREE, THYROIDAB in the last 72 hours. Anemia Panel: No results for input(s): VITAMINB12, FOLATE, FERRITIN, TIBC, IRON, RETICCTPCT in the last 72 hours. Urine analysis:    Component Value Date/Time   COLORURINE YELLOW 08/15/2018 2048   APPEARANCEUR CLEAR 08/15/2018 2048   LABSPEC 1.006 08/15/2018 2048   PHURINE 8.0 08/15/2018 2048   GLUCOSEU NEGATIVE 08/15/2018 2048   HGBUR NEGATIVE 08/15/2018 2048   BILIRUBINUR NEGATIVE 08/15/2018 2048   BILIRUBINUR negative 02/25/2018 1121   KETONESUR NEGATIVE 08/15/2018 2048   PROTEINUR  NEGATIVE 08/15/2018 2048   UROBILINOGEN 0.2 02/25/2018 1121   UROBILINOGEN 1.0 04/08/2009 1430   NITRITE NEGATIVE 08/15/2018 2048   LEUKOCYTESUR NEGATIVE 08/15/2018 2048   Sepsis Labs: @LABRCNTIP (procalcitonin:4,lacticidven:4) )No results found for this or any previous visit (from the past 240 hour(s)).   Radiological Exams on Admission: No results found.    Assessment/Plan Principal Problem:   Hepatic encephalopathy (HCC) Active Problems:   Portal hypertension with esophageal varices (HCC)   Thrombocytopenia (HCC)   Alcoholic cirrhosis of liver with ascites (HCC)   Hypokalemia   Hypomagnesemia    1. Abdominal distention likely from decompensated liver cirrhosis with ascites -we will get ultrasound-guided paracentesis in the morning.  Also to check patency of TIPS.  Until then we will keep patient on empiric antibiotics for SBP.  Check labs for paracentesis.  Patient's lipase is mildly elevated.  If persistently elevated and patient had nausea vomiting will consider CT abdomen. 2. Hepatic encephalopathy -appears to be improved after admission.  Will continue lactulose recheck ammonia in the morning.  Correct lites changes.  Empiric antibiotic coverage for possible SBP until we get paracentesis. 3. Severe hypokalemia and hypomagnesemia likely from nausea vomiting.  Replace and recheck.  Holding Lasix and diuretics for now and will be correct potassium.  Closely follow respiratory status. 4. Nausea vomiting cause not clear.  Abdomen appears benign but distended.  KUB is pending. 5. Thrombocytopenia likely from cirrhosis.  Follow CBC. 6. Alcoholic liver cirrhosis being followed by above gastroenterologist.  As explained in #3 holding off patient's Lasix and diuretics for now until he correct potassium.  Patient is on lactulose recheck ammonia levels.  Check paracentesis lab. 7. Elevated LFTs likely from cirrhosis appears to be at baseline.  LFTs.   DVT prophylaxis: SCDs. Code Status:  Full code. Family Communication: Discussed with patient. Disposition Plan: Home.  Consults called: None. Admission status: Observation with   Eduard Clos MD [facility] Pager [phone].  If 7PM-7AM, please contact night-coverage [email or web address] Password [email or web address]  08/16/2018, 12:58 AM

## 2018-08-16 NOTE — Hospital Discharge Follow-Up (Signed)
The patient is known to the [facility] and will be followed by [facility] post discharge.  He currently lives with his elderly mother who has ? Dementia and wanders. These concerns have been addressed at a recent office visit.  His son, [name], is his primary support but he does not live with the patient. He was given the contact # for Adult Protective Services to call if he has on going concerns about his mother's safety.  He said that he has not yet gone to DSS to apply for medicaid nor has he gone to social security to apply for disability.  He still needs to apply for the [employer] and [employer] after the determination of a medicaid application is made. He also needs to apply for a Blue Card that will allow him to charge medications to an account that he has filled at [facility].   His son provides transportation to his medical appointments and he has also submitted a SCAT application.   An appointment has been scheduled at [facility] for 08/30/18 @ 1410 and the information has been placed on the AVS.

## 2018-08-16 NOTE — Plan of Care (Signed)
Will cont with poc 

## 2018-08-16 NOTE — Progress Notes (Signed)
PHARMACY NOTE:  ANTIMICROBIAL RENAL DOSAGE ADJUSTMENT  Current antimicrobial regimen includes a mismatch between antimicrobial dosage and indication.  As per policy approved by the Pharmacy & Therapeutics and Medical Executive Committees, the antimicrobial dosage will be adjusted accordingly.  Current antimicrobial dosage:  Rocephin 1 Gm IV q24h  Indication: SBP coverage  Renal Function:  Estimated Creatinine Clearance: 88.5 mL/min (by C-G formula based on SCr of 0.78 mg/dL). []      On intermittent HD, scheduled: []      On CRRT    Antimicrobial dosage has been changed to:  Rocephin 2 Gm IV q24h  Thank you for allowing pharmacy to be a part of this patient's care.  Lorenza Evangelist, [facility] 08/16/2018 5:14 AM

## 2018-08-16 NOTE — Progress Notes (Signed)
PROGRESS NOTE    Kenneth [name]   WUJ:[id number]  DOB: [birth year]  DOA: 08/15/2018 PCP: [name], MD   Brief Narrative:  Kenneth [name] is a 57 y.o. male with history of hepatic cirrhosis status post TIPS procedure with history of esophageal varices who has had recent paracentesis done 2 weeks ago was brought to the ER after patient's family found patient was increasingly confused.  Patient also states that over the past 4 days patient felt that his abdomen is getting more distended and he has has episodes of nausea and vomiting.  In ED > Ammonia 79, K 3.1, oriented to self and place, abdomen distended- given IVF and K.  S/p IR guided paracentesis on 10/16, ~ 4 L removed  Subjective: Quite sleepy. No complaints. No abdominal pain. Last episode of vomiting > 24 hrs ago.     Assessment & Plan:   Principal Problem:   Hepatic encephalopathy   - cont Lactulose- quite drowsy this AM after being given Ativan last night for anxiety- d/c Ativan for now  Active Problems: Junctional rhythm - correct electrolyte abnormalities - have asked for cardiology eval - BP stable in low 100s  Vomiting - resolved- advance to soft diet    Hypokalemia   Hypomagnesemia - due to  vomiting and diuretics - replacing via IV and orally - K + is 3.1 today - Mg+ was 1.4 last night-  has improved to 1.8 today - try to achieve K+ > 4.0 and Mg+ > 2.0  Chronic hypotension - cont Midodrine    Alcoholic cirrhosis of liver with ascites  - s/p paracentesis as mentioned above - abdomen is non-tender and not severely distended - no need for another paracentesis- stop Ceftriaxone - resume oral diuretics- start Aldactone instead of Lasix due to hypokalemia- 1200 fluid restriction added - no longer drinks alcohol    Advanced cirrhosis -  Portal hypertension with esophageal varices -  Splenomegaly - elevated INR  - s/p TIPS    Thrombocytopenia  - chronic and stable - likely related to cirrhosis/  splenomegaly-   DVT prophylaxis: SCDs Code Status: Full code Family Communication:  Disposition Plan: home when junctional rhythm resolves Consultants:   cardiology Procedures:   none Antimicrobials:  Anti-infectives (From admission, onward)   Start     Dose/Rate Route Frequency Ordered Stop   08/16/18 0115  cefTRIAXone (ROCEPHIN) 2 g in sodium chloride 0.9 % 100 mL IVPB     2 g 200 mL/hr over 30 Minutes Intravenous Daily at bedtime 08/16/18 0058         Objective: Vitals:   08/15/18 2318 08/15/18 2352 08/15/18 2353 08/16/18 0518  BP: 114/79  105/71 107/68  Pulse: 79  74 78  Resp: 19  20 18   Temp:   98.9 F (37.2 C) 98 F (36.7 C)  TempSrc:   Oral Oral  SpO2: 98%  97% 96%  Weight:  61.4 kg    Height:  5\' 7"  (1.702 m)      Intake/Output Summary (Last 24 hours) at 08/16/2018 0838 Last data filed at 08/16/2018 0600 Gross per 24 hour  Intake 1020.16 ml  Output -  Net 1020.16 ml   Filed Weights   08/15/18 2352  Weight: 61.4 kg    Examination: General exam: Appears comfortable  HEENT: PERRLA, oral mucosa moist, no sclera icterus or thrush Respiratory system: Clear to auscultation. Respiratory effort normal. Cardiovascular system: S1 & S2 heard, RRR.   Gastrointestinal system: Abdomen soft, non-tender, moderately distended.  Normal bowel sounds.  Extremities: No cyanosis, clubbing or edema Skin: No rashes or ulcers Psychiatry:  sleepy    Data Reviewed: I have personally reviewed following labs and imaging studies  CBC: Recent Labs  Lab 08/11/18 1418 08/15/18 2029 08/16/18 0655  WBC 5.5 6.3 5.6  NEUTROABS 3.6  --   --   HGB 12.3* 13.5 11.7*  HCT 35.2* 40.6 34.9*  MCV 94.8 95.3 95.4  PLT 81.0* 91* 71*   Basic Metabolic Panel: Recent Labs  Lab 08/11/18 1418 08/15/18 2029 08/15/18 2224 08/16/18 0655  NA 141 139  --  141  K 3.7 2.3*  --  3.1*  CL 105 100  --  106  CO2 29 29  --  27  GLUCOSE 99 108*  --  76  BUN 7 7  --  7  CREATININE 0.83  0.78  --  0.58*  CALCIUM 8.4 8.2*  --  7.8*  MG  --   --  1.4* 1.8   GFR: Estimated Creatinine Clearance: 88.5 mL/min (A) (by C-G formula based on SCr of 0.58 mg/dL (L)). Liver Function Tests: Recent Labs  Lab 08/11/18 1418 08/15/18 2029 08/16/18 0655  AST 54* 75* 65*  ALT 33 39 36  ALKPHOS 155* 154* 126  BILITOT 5.4* 5.9* 4.6*  PROT 5.9* 6.4* 5.3*  ALBUMIN 2.7* 2.6* 2.2*   Recent Labs  Lab 08/15/18 2029 08/16/18 0655  LIPASE 60* 63*   Recent Labs  Lab 08/15/18 2039  AMMONIA 79*   Coagulation Profile: Recent Labs  Lab 08/11/18 1418  INR 2.0*   Cardiac Enzymes: No results for input(s): CKTOTAL, CKMB, CKMBINDEX, TROPONINI in the last 168 hours. BNP (last 3 results) No results for input(s): PROBNP in the last 8760 hours. HbA1C: No results for input(s): HGBA1C in the last 72 hours. CBG: No results for input(s): GLUCAP in the last 168 hours. Lipid Profile: No results for input(s): CHOL, HDL, LDLCALC, TRIG, CHOLHDL, LDLDIRECT in the last 72 hours. Thyroid Function Tests: No results for input(s): TSH, T4TOTAL, FREET4, T3FREE, THYROIDAB in the last 72 hours. Anemia Panel: No results for input(s): VITAMINB12, FOLATE, FERRITIN, TIBC, IRON, RETICCTPCT in the last 72 hours. Urine analysis:    Component Value Date/Time   COLORURINE YELLOW 08/15/2018 2048   APPEARANCEUR CLEAR 08/15/2018 2048   LABSPEC 1.006 08/15/2018 2048   PHURINE 8.0 08/15/2018 2048   GLUCOSEU NEGATIVE 08/15/2018 2048   HGBUR NEGATIVE 08/15/2018 2048   BILIRUBINUR NEGATIVE 08/15/2018 2048   BILIRUBINUR negative 02/25/2018 1121   KETONESUR NEGATIVE 08/15/2018 2048   PROTEINUR NEGATIVE 08/15/2018 2048   UROBILINOGEN 0.2 02/25/2018 1121   UROBILINOGEN 1.0 04/08/2009 1430   NITRITE NEGATIVE 08/15/2018 2048   LEUKOCYTESUR NEGATIVE 08/15/2018 2048   Sepsis Labs: @LABRCNTIP (procalcitonin:4,lacticidven:4) )No results found for this or any previous visit (from the past 240 hour(s)).        Radiology Studies: Dg Abd Acute W/chest  Result Date: 08/16/2018 CLINICAL DATA:  Abdominal pain. EXAM: DG ABDOMEN ACUTE W/ 1V CHEST COMPARISON:  CT 04/26/2018 FINDINGS: Linear subsegmental atelectasis at the left lung base. No confluent consolidation. No pleural fluid. No free intra-abdominal air. No bowel dilatation to suggest obstruction. Air-filled small bowel in the right abdomen is nondilated and appears similar to prior CT. Moderate colonic stool burden with stool in the rectum. Calcified gallstones. TIPS in the right upper quadrant. Pelvic calcifications consistent with phleboliths. IMPRESSION: 1. No bowel obstruction or free air.  Moderate colonic stool burden. 2. Gallstones. 3. Subsegmental atelectasis at the  left lung base. Electronically Signed   By: Narda Rutherford M.D.   On: 08/16/2018 06:06      Scheduled Meds: . [START ON 08/17/2018] buPROPion  150 mg Oral BID  . feeding supplement (ENSURE ENLIVE)  237 mL Oral BID BM  . FLUoxetine  40 mg Oral Daily  . [START ON 08/17/2018] gabapentin  300 mg Oral QHS  . [START ON 08/17/2018] Influenza vac split quadrivalent PF  0.5 mL Intramuscular Tomorrow-1000  . lactulose  20 g Oral TID  . midodrine  5 mg Oral TID WC   Continuous Infusions: . cefTRIAXone (ROCEPHIN)  IV Stopped (08/16/18 0305)     LOS: 0 days    Time spent in minutes: 35    Calvert Cantor, MD [facility] Pager: [email or web address] Password [email or web address] 08/16/2018, 8:38 AM

## 2018-08-17 DIAGNOSIS — E440 Moderate protein-calorie malnutrition: Secondary | ICD-10-CM

## 2018-08-17 DIAGNOSIS — I498 Other specified cardiac arrhythmias: Secondary | ICD-10-CM

## 2018-08-17 LAB — CBC
HEMATOCRIT: 37.2 % — AB (ref 39.0–52.0)
HEMOGLOBIN: 11.9 g/dL — AB (ref 13.0–17.0)
MCH: 31.6 pg (ref 26.0–34.0)
MCHC: 32 g/dL (ref 30.0–36.0)
MCV: 98.9 fL (ref 80.0–100.0)
NRBC: 0 % (ref 0.0–0.2)
Platelets: 80 10*3/uL — ABNORMAL LOW (ref 150–400)
RBC: 3.76 MIL/uL — ABNORMAL LOW (ref 4.22–5.81)
RDW: 16.3 % — ABNORMAL HIGH (ref 11.5–15.5)
WBC: 6.1 10*3/uL (ref 4.0–10.5)

## 2018-08-17 LAB — BASIC METABOLIC PANEL
Anion gap: 5 (ref 5–15)
BUN: 9 mg/dL (ref 6–20)
CHLORIDE: 112 mmol/L — AB (ref 98–111)
CO2: 26 mmol/L (ref 22–32)
Calcium: 8.5 mg/dL — ABNORMAL LOW (ref 8.9–10.3)
Creatinine, Ser: 0.62 mg/dL (ref 0.61–1.24)
GFR calc non Af Amer: >60 mL/min (ref >60)
Glucose, Bld: 78 mg/dL (ref 70–99)
POTASSIUM: 4.3 mmol/L (ref 3.5–5.1)
SODIUM: 143 mmol/L (ref 135–145)

## 2018-08-17 LAB — GLUCOSE, CAPILLARY: GLUCOSE-CAPILLARY: 98 mg/dL (ref 70–99)

## 2018-08-17 LAB — MAGNESIUM: Magnesium: 1.8 mg/dL (ref 1.7–2.4)

## 2018-08-17 MED ORDER — BUPROPION HCL ER (SR) 100 MG PO TB12
100.0000 mg | ORAL_TABLET | Freq: Every day | ORAL | Status: DC
  Administered 2018-08-18 – 2018-08-26 (×9): 100 mg via ORAL
  Filled 2018-08-17 (×9): qty 1

## 2018-08-17 MED ORDER — POTASSIUM CHLORIDE ER 20 MEQ PO TBCR: 40.0000 meq | EXTENDED_RELEASE_TABLET | Freq: Every day | ORAL | 0 refills | Status: DC

## 2018-08-17 MED ORDER — ADULT MULTIVITAMIN W/MINERALS CH: 1.0000 | ORAL_TABLET | Freq: Every day | ORAL | 0 refills | Status: DC

## 2018-08-17 MED ORDER — SPIRONOLACTONE 100 MG PO TABS: 100.0000 mg | ORAL_TABLET | Freq: Every day | ORAL | 0 refills | Status: DC

## 2018-08-17 MED ORDER — BUPROPION HCL ER (SR) 100 MG PO TB12: 100.0000 mg | ORAL_TABLET | Freq: Every day | ORAL | 0 refills | Status: DC

## 2018-08-17 MED ORDER — ENSURE ENLIVE PO LIQD: 237.0000 mL | Freq: Two times a day (BID) | ORAL | 12 refills | Status: DC

## 2018-08-17 NOTE — Progress Notes (Signed)
[facility]  I was sent a text page yesterday at 6:21 by the RN stating that the patient's wife and son were in the room and wanted to speak with me. I agreed and subsequently, his wife (or ex wife) answered the phone. We had an extensive conversation about the patient's condition and the plan for discharge tomorrow (meaning today) and also the need to start discussions about palliative care as he is noted to be deteriorating. All other questions were answered. Today the son is calling and stating that the patient has no where to go and no one to take care of him and specifically is stating that no one spoke with the patient's family. Of note, the patient is awake and alert and completely capable of understanding his care plan which I discussed with him today.   Calvert Cantor, MD Pager: [email or web address]

## 2018-08-17 NOTE — Care Management Note (Signed)
Case Management Note  Patient Details  Name: Kenneth [name] MRN: [id number] Date of Birth: 07-13-1961  Subjective/Objective:Patient ordered for [facility] able to provide HHC-rep Clydie Braun aware of HHC orders, & d/c. No further CM needs.                    Action/Plan:dc home w/HHC.   Expected Discharge Date:  08/17/18               Expected Discharge Plan:  Home w Home Health Services  In-House Referral:     Discharge planning Services  CM Consult  Post Acute Care Choice:    Choice offered to:     DME Arranged:    DME Agency:     HH Arranged:  RN, PT, OT, Nurse's Aide HH Agency:  [facility]  Status of Service:  Completed, signed off  If discussed at Long Length of Stay Meetings, dates discussed:    Additional Comments:  Lanier Clam, RN 08/17/2018, 3:52 PM

## 2018-08-17 NOTE — Discharge Instructions (Signed)

## 2018-08-17 NOTE — Plan of Care (Signed)
Pt unsteady on his feet. Pt has a good appetite this shift.

## 2018-08-17 NOTE — Care Management Note (Signed)
Case Management Note  Patient Details  Name: Kenneth [name] MRN: [id number] Date of Birth: [birth year]  Subjective/Objective:See [facility] liason note-Jane. Patient has appt [facility] 08/30/18,can get meds @ their pharmacy.                     Action/Plan:d/c plan home.   Expected Discharge Date:                  Expected Discharge Plan:  Home/Self Care  In-House Referral:     Discharge planning Services  CM Consult  Post Acute Care Choice:    Choice offered to:     DME Arranged:    DME Agency:     HH Arranged:    HH Agency:     Status of Service:  In process, will continue to follow  If discussed at Long Length of Stay Meetings, dates discussed:    Additional Comments:  Lanier Clam, RN 08/17/2018, 11:45 AM

## 2018-08-17 NOTE — Discharge Summary (Signed)
Physician Discharge Summary  Kenneth [name] ZOX:[id number] DOB: 12/02/1960 DOA: 08/15/2018  PCP: [name], MD  Admit date: 08/15/2018 Discharge date: 08/17/2018  Admitted From: home  Disposition:  home   Recommendations for Outpatient Follow-up:  1. Bmet in 1 wk 2. Consider discussions about palliative care  Discharge Condition:  stable   CODE STATUS:  Full code   Consultations:  cardiology    Discharge Diagnoses:  Principal Problem:   Hepatic encephalopathy (HCC) Active Problems:  Hypokalemia   Hypomagnesemia   Malnutrition of moderate degree   Accelerated junctional rhythm   Portal hypertension with esophageal varices (HCC)   Thrombocytopenia (HCC)   Alcoholic cirrhosis of liver with ascites (HCC)  Depression  Brief Summary: Kenneth [name] is a 57 y.o.malewithhistory of hepatic cirrhosis status post TIPS procedure with history of esophageal varices who has had recent paracentesis done 2 weeks ago was brought to the ER after patient's family found patient was increasingly confused. Patient also states that over the past 4 days patient felt that his abdomen is getting more distended and he has has episodes of nausea and vomiting.  In ED > Ammonia 79, K 3.1, oriented to self and place, abdomen distended- given IVF and K.  S/p IR guided paracentesis on 10/16, ~ 4 L removed  Hospital Course:  Principal Problem:   Hepatic encephalopathy   - cont Lactulose- he is quite awake and alert  Active Problems:    Hypokalemia   Hypomagnesemia - due to  vomiting and diuretics - replacing via IV and orally - K + is 3.1 today - Mg+ was 1.4 last night-  has improved to 1.8 today - try to achieve K+ > 4.0 and Mg+ > 2.0  Accelerated Junctional rhythm - corrected electrolyte abnormalities- HR is still junctional in 60s - have asked for cardiology eval- Cardiology does not feel any further work up is needed as he is asymptomatic- d/w Dr Delton See today - BP stable in 90-  100s- cont Midodrine for this  Vomiting - resolved- advance to soft diet  Chronic hypotension - cont Midodrine    Alcoholic cirrhosis of liver with ascites  - s/p paracentesis as mentioned above - abdomen is non-tender and not severely distended - no need for another paracentesis- stop Ceftriaxone - resumed oral diuretics- 1200 fluid restriction added - no longer drinks alcohol    Advanced cirrhosis -  Portal hypertension with esophageal varices -  Splenomegaly - elevated INR  - s/p TIPS    Thrombocytopenia  - chronic and stable - likely related to cirrhosis/ splenomegaly-   Depression/anxiety - Welbutrin dose has been changed from 150 > 100 mg daily due to poor liver function   Discharge Exam: Vitals:   08/17/18 1350 08/17/18 1412  BP: (!) 90/58 101/62  Pulse: 68 74  Resp:  18  Temp:    SpO2: 97% 99%   Vitals:   08/16/18 2025 08/17/18 0527 08/17/18 1350 08/17/18 1412  BP: 99/74 99/67 (!) 90/58 101/62  Pulse: 66 63 68 74  Resp: 18 18  18   Temp: 98.2 F (36.8 C) 97.9 F (36.6 C)    TempSrc: Oral Oral Oral   SpO2: 96% 98% 97% 99%  Weight:      Height:        General: Pt is alert, awake, not in acute distress Cardiovascular: RRR, S1/S2 +, no rubs, no gallops Respiratory: CTA bilaterally, no wheezing, no rhonchi Abdominal: Soft, NT, ND, bowel sounds + Extremities: no edema, no cyanosis  Discharge Instructions  Discharge Instructions    Diet - low sodium heart healthy   Complete by:  As directed    Increase activity slowly   Complete by:  As directed      Allergies as of 08/17/2018      Reactions   Latex Swelling, Rash      Medication List    TAKE these medications   aMILoride 5 MG tablet Commonly known as:  MIDAMOR Take 2 tablets (10 mg total) by mouth daily.   buPROPion 100 MG 12 hr tablet Commonly known as:  WELLBUTRIN SR Take 1 tablet (100 mg total) by mouth daily. Start taking on:  08/18/2018 What changed:    medication  strength  how much to take  when to take this   diclofenac sodium 1 % Gel Commonly known as:  VOLTAREN Apply 4 g topically 4 (four) times daily.   ergocalciferol 50000 units capsule Commonly known as:  VITAMIN D2 Take 1 capsule (50,000 Units total) by mouth once a week.   feeding supplement (ENSURE ENLIVE) Liqd Take 237 mLs by mouth 2 (two) times daily between meals. Start taking on:  08/18/2018   FLUoxetine 40 MG capsule Commonly known as:  PROZAC Take 1 capsule (40 mg total) by mouth daily.   furosemide 40 MG tablet Commonly known as:  LASIX Take 1.5 tablets (60 mg total) by mouth daily.   gabapentin 300 MG capsule Commonly known as:  NEURONTIN Take 1 capsule (300 mg total) by mouth at bedtime.   lactulose 10 GM/15ML solution Commonly known as:  CHRONULAC Increase to 4 tablespoons by mouth twice daily.   midodrine 5 MG tablet Commonly known as:  PROAMATINE Take 1 tablet (5 mg total) by mouth 3 (three) times daily with meals.   multivitamin with minerals Tabs tablet Take 1 tablet by mouth daily. Start taking on:  08/18/2018   ondansetron 4 MG tablet Commonly known as:  ZOFRAN Take 1 tablet (4 mg total) by mouth every 8 (eight) hours as needed for nausea or vomiting.   Potassium Chloride ER 20 MEQ Tbcr Take 40 mEq by mouth daily.      Follow-up Information    [facility]. Go on 08/30/2018.   Why:  at 2:10pm for an appointment with Dr Alvis Lemmings. Contact information: [address] [place] [address] [phone]         Allergies  Allergen Reactions  . Latex Swelling and Rash     Procedures/Studies:   Dg Abd Acute W/chest  Result Date: 08/16/2018 CLINICAL DATA:  Abdominal pain. EXAM: DG ABDOMEN ACUTE W/ 1V CHEST COMPARISON:  CT 04/26/2018 FINDINGS: Linear subsegmental atelectasis at the left lung base. No confluent consolidation. No pleural fluid. No free intra-abdominal air. No bowel dilatation  to suggest obstruction. Air-filled small bowel in the right abdomen is nondilated and appears similar to prior CT. Moderate colonic stool burden with stool in the rectum. Calcified gallstones. TIPS in the right upper quadrant. Pelvic calcifications consistent with phleboliths. IMPRESSION: 1. No bowel obstruction or free air.  Moderate colonic stool burden. 2. Gallstones. 3. Subsegmental atelectasis at the left lung base. Electronically Signed   By: Narda Rutherford M.D.   On: 08/16/2018 06:06   Ir Paracentesis  Result Date: 08/03/2018 INDICATION: History of cirrhosis s/p TIPS with recurrent ascites. Request for therapeutic paracentesis today with 5 L limit and post procedure albumin administration. EXAM: ULTRASOUND GUIDED THERAPEUTIC PARACENTESIS MEDICATIONS: 10 mL 2% lidocaine. COMPLICATIONS: None immediate.  PROCEDURE: Informed written consent was obtained from the patient after a discussion of the risks, benefits and alternatives to treatment. A timeout was performed prior to the initiation of the procedure. Initial ultrasound scanning demonstrates a large amount of ascites within the left lower abdominal quadrant. The left lower abdomen was prepped and draped in the usual sterile fashion. 2% lidocaine was used for local anesthesia. Following this, a 19 gauge, 7-cm, Yueh catheter was introduced. An ultrasound image was saved for documentation purposes. The paracentesis was performed. The catheter was removed and a dressing was applied. The patient tolerated the procedure well without immediate post procedural complication. FINDINGS: A total of approximately 4.1L of yellow fluid was removed. IMPRESSION: Successful ultrasound-guided paracentesis yielding 4.1 liters of peritoneal fluid. Read by Lynnette Caffey, PA-C Electronically Signed   By: Gilmer Mor D.O.   On: 08/03/2018 14:15     The results of significant diagnostics from this hospitalization (including imaging, microbiology, ancillary and  laboratory) are listed below for reference.     Microbiology: No results found for this or any previous visit (from the past 240 hour(s)).   Labs: BNP (last 3 results) No results for input(s): BNP in the last 8760 hours. Basic Metabolic Panel: Recent Labs  Lab 08/11/18 1418 08/15/18 2029 08/15/18 2224 08/16/18 0655 08/16/18 1622 08/17/18 0620  NA 141 139  --  141 140 143  K 3.7 2.3*  --  3.1* 4.5 4.3  CL 105 100  --  106 107 112*  CO2 29 29  --  27 27 26   GLUCOSE 99 108*  --  76 87 78  BUN 7 7  --  7 8 9   CREATININE 0.83 0.78  --  0.58* 0.73 0.62  CALCIUM 8.4 8.2*  --  7.8* 8.1* 8.5*  MG  --   --  1.4* 1.8  --  1.8   Liver Function Tests: Recent Labs  Lab 08/11/18 1418 08/15/18 2029 08/16/18 0655  AST 54* 75* 65*  ALT 33 39 36  ALKPHOS 155* 154* 126  BILITOT 5.4* 5.9* 4.6*  PROT 5.9* 6.4* 5.3*  ALBUMIN 2.7* 2.6* 2.2*   Recent Labs  Lab 08/15/18 2029 08/16/18 0655  LIPASE 60* 63*   Recent Labs  Lab 08/15/18 2039  AMMONIA 79*   CBC: Recent Labs  Lab 08/11/18 1418 08/15/18 2029 08/16/18 0655 08/17/18 0620  WBC 5.5 6.3 5.6 6.1  NEUTROABS 3.6  --   --   --   HGB 12.3* 13.5 11.7* 11.9*  HCT 35.2* 40.6 34.9* 37.2*  MCV 94.8 95.3 95.4 98.9  PLT 81.0* 91* 71* 80*   Cardiac Enzymes: No results for input(s): CKTOTAL, CKMB, CKMBINDEX, TROPONINI in the last 168 hours. BNP: Invalid input(s): POCBNP CBG: No results for input(s): GLUCAP in the last 168 hours. D-Dimer No results for input(s): DDIMER in the last 72 hours. Hgb A1c No results for input(s): HGBA1C in the last 72 hours. Lipid Profile No results for input(s): CHOL, HDL, LDLCALC, TRIG, CHOLHDL, LDLDIRECT in the last 72 hours. Thyroid function studies No results for input(s): TSH, T4TOTAL, T3FREE, THYROIDAB in the last 72 hours.  Invalid input(s): FREET3 Anemia work up No results for input(s): VITAMINB12, FOLATE, FERRITIN, TIBC, IRON, RETICCTPCT in the last 72 hours. Urinalysis     Component Value Date/Time   COLORURINE YELLOW 08/15/2018 2048   APPEARANCEUR CLEAR 08/15/2018 2048   LABSPEC 1.006 08/15/2018 2048   PHURINE 8.0 08/15/2018 2048   GLUCOSEU NEGATIVE 08/15/2018 2048   HGBUR NEGATIVE  08/15/2018 2048   BILIRUBINUR NEGATIVE 08/15/2018 2048   BILIRUBINUR negative 02/25/2018 1121   KETONESUR NEGATIVE 08/15/2018 2048   PROTEINUR NEGATIVE 08/15/2018 2048   UROBILINOGEN 0.2 02/25/2018 1121   UROBILINOGEN 1.0 04/08/2009 1430   NITRITE NEGATIVE 08/15/2018 2048   LEUKOCYTESUR NEGATIVE 08/15/2018 2048   Sepsis Labs Invalid input(s): PROCALCITONIN,  WBC,  LACTICIDVEN Microbiology No results found for this or any previous visit (from the past 240 hour(s)).   Time coordinating discharge in minutes: 65  SIGNED:   Calvert Cantor, MD  [facility] 08/17/2018, 2:35 PM Pager   If 7PM-7AM, please contact night-coverage [email or web address] Password [email or web address]

## 2018-08-17 NOTE — Progress Notes (Signed)
Patients son was contacted regarding patients discharge. Patients son Kenneth [name] states that pt does not have an actual address. He reports that there will be no family/friends available to assist in the patients care. Resources reviewed with patients son, see progress note from previous CM/SW 10/7. Son reports he has not followed up. He was encouraged to do so. Pt's son then stated that he will not be available to take patient home. Provider updated. CM/SW contacted to assist.

## 2018-08-18 ENCOUNTER — Ambulatory Visit (HOSPITAL_COMMUNITY): Admission: RE | Admit: 2018-08-18 | Payer: 59 | Source: Ambulatory Visit

## 2018-08-18 LAB — COMPREHENSIVE METABOLIC PANEL
ALT: 37 U/L (ref 0–44)
ANION GAP: 7 (ref 5–15)
AST: 70 U/L — AB (ref 15–41)
Albumin: 2.4 g/dL — ABNORMAL LOW (ref 3.5–5.0)
Alkaline Phosphatase: 137 U/L — ABNORMAL HIGH (ref 38–126)
BUN: 9 mg/dL (ref 6–20)
CHLORIDE: 105 mmol/L (ref 98–111)
CO2: 29 mmol/L (ref 22–32)
Calcium: 8.7 mg/dL — ABNORMAL LOW (ref 8.9–10.3)
Creatinine, Ser: 0.65 mg/dL (ref 0.61–1.24)
Glucose, Bld: 122 mg/dL — ABNORMAL HIGH (ref 70–99)
POTASSIUM: 3.7 mmol/L (ref 3.5–5.1)
Sodium: 141 mmol/L (ref 135–145)
Total Bilirubin: 5 mg/dL — ABNORMAL HIGH (ref 0.3–1.2)
Total Protein: 5.8 g/dL — ABNORMAL LOW (ref 6.5–8.1)

## 2018-08-18 LAB — CBC WITH DIFFERENTIAL/PLATELET
Abs Immature Granulocytes: 0.02 10*3/uL (ref 0.00–0.07)
BASOS PCT: 1 %
Basophils Absolute: 0.1 10*3/uL (ref 0.0–0.1)
EOS ABS: 0.2 10*3/uL (ref 0.0–0.5)
Eosinophils Relative: 4 %
HCT: 37.2 % — ABNORMAL LOW (ref 39.0–52.0)
Hemoglobin: 12.2 g/dL — ABNORMAL LOW (ref 13.0–17.0)
IMMATURE GRANULOCYTES: 0 %
Lymphocytes Relative: 14 %
Lymphs Abs: 0.7 10*3/uL (ref 0.7–4.0)
MCH: 32.4 pg (ref 26.0–34.0)
MCHC: 32.8 g/dL (ref 30.0–36.0)
MCV: 98.7 fL (ref 80.0–100.0)
MONOS PCT: 14 %
Monocytes Absolute: 0.8 10*3/uL (ref 0.1–1.0)
NEUTROS PCT: 67 %
NRBC: 0 % (ref 0.0–0.2)
Neutro Abs: 3.7 10*3/uL (ref 1.7–7.7)
PLATELETS: 80 10*3/uL — AB (ref 150–400)
RBC: 3.77 MIL/uL — ABNORMAL LOW (ref 4.22–5.81)
RDW: 16.1 % — ABNORMAL HIGH (ref 11.5–15.5)
WBC: 5.4 10*3/uL (ref 4.0–10.5)

## 2018-08-18 LAB — AMMONIA: AMMONIA: 102 umol/L — AB (ref 9–35)

## 2018-08-18 LAB — PHOSPHORUS: PHOSPHORUS: 3.3 mg/dL (ref 2.5–4.6)

## 2018-08-18 LAB — MAGNESIUM: MAGNESIUM: 1.6 mg/dL — AB (ref 1.7–2.4)

## 2018-08-18 MED ORDER — SODIUM CHLORIDE 0.9 % IV SOLN
INTRAVENOUS | Status: DC | PRN
  Administered 2018-08-18 – 2018-08-21 (×2): 250 mL via INTRAVENOUS
  Administered 2018-08-22: 1000 mL via INTRAVENOUS

## 2018-08-18 MED ORDER — LACTULOSE 10 GM/15ML PO SOLN
30.0000 g | Freq: Three times a day (TID) | ORAL | Status: DC
  Administered 2018-08-18: 30 g via ORAL
  Filled 2018-08-18 (×2): qty 45

## 2018-08-18 MED ORDER — MAGNESIUM SULFATE 2 GM/50ML IV SOLN
2.0000 g | Freq: Once | INTRAVENOUS | Status: AC
  Administered 2018-08-18: 2 g via INTRAVENOUS
  Filled 2018-08-18: qty 50

## 2018-08-18 NOTE — Progress Notes (Signed)
   08/18/18 1420  Clinical Encounter Type  Visited With Patient and family together  Visit Type Initial  Referral From Palliative care team  Consult/Referral To Chaplain  The Chaplain introduced herself to Pt. from Palliative Care census. Upon arrival the chaplain was greeted by Pt. and Pt. son.  The Pt. gave permission for the son to share the details of the Pt.'s hospital admission with the chaplain.  The Pt. son shared his personal task of getting the paperwork signed for his father's continued care. The Pt.'s son reflected on his father's positive character when the Pt. was employed in [employer].  The son added the Pt. family is supportive but unable to provide the necessary 24 hr care he believes his father will need.  The chaplain informed the Pt. and son of the availability of future spiritual care.

## 2018-08-18 NOTE — Progress Notes (Signed)
CSW notified that patient's son refused to pick up patient stating patient does not have a place to live and no support.  CSW spoke with patient at bedside regarding discharge planning and son's refusal to pick up patient. Patient reported the resides with his mother and brother at [address] [facility] [address] [facility]. Patient reported that his mother has alzheimer's and is not able to assist patient with care needs. Patient reported that his brother Lorin Picket) does not want him to return home because patient is sick. CSW inquired about any other support systems that patient has, patient reported none. Patient reported that prior to hospitalization he was independent with ADLs and ambulation. Patient reported that he currently has no income or insurance and has not worked since June 2019. Patient reported taht his preference is to return home but he doesn't have any support. CSW asked for patient's permission to reach out to patient's family to discuss discharge planning further. Patient granted CSW verbal permission to speak with his family.  CSW contacted patient's sister Selden Noteboom [phone]), no answer nor option to leave a voicemail.  CSW contacted patient's sister Elmond Poehlman [phone]), no answer and voicemail box full.  CSW contacted patient's son Amelia Macken [phone]), no answer nor option to leave a voicemail.  CSW staffed patient's case with CSW Chiropodist. CSW Chiropodist approved 2 week letter of guarantee for SNF placement.   CSW received return call from patient's son, patient's son reported that it is unsafe for patient to return home because there is no family to care for patient. Patient's son reported that patient's family all work and cannot care for patient as much as they would like to. Patient's son reported that patient was caring for his mom who has alzheimer's and patient's brother is not usually at home. CSW and patient's son discussed [facility] term care medicaid  and disability, patient's son agreed to go apply for both for patient.  Patient currently has no safe discharge plan and will need facility placement unless he improves at the hospital and is able to care for himself. CSW will complete FL2 and seek SNF placement that will accept (2 week letter of guarantee).   Celso Sickle, [place] Clinical Social Worker [facility] Cell#: [phone]

## 2018-08-18 NOTE — Progress Notes (Signed)
PROGRESS NOTE    Kenneth [name]  FAO:[id number] DOB: 1961/04/04 DOA: 08/15/2018 PCP: [name], MD   Brief Narrative:  [name] a 57 y.o.malewithhistory of hepatic cirrhosis status post TIPS procedure with history of esophageal varices who has had recent paracentesis done 2 weeks ago was brought to the ER after patient's family found patient was increasingly confused. Patient also states that over thepast 4 days patient felt that his abdomen is getting more distendedand he has has episodes of nausea and vomiting.   In ED >Ammonia 79, K 3.1, oriented to self and place, abdomen distended- given IVF and K.  S/p IR guided paracentesis on 10/16, ~ 4 L removed.  Is deemed stable to be discharged yesterday but my colleague Dr. Butler Denmark however family refused to pick the patient up as they felt that there is no safe for discharge as they could not take care of the patient for home.  Social work is now involved and a 2-week letter guarantee has been obtained however will need to try and find a skilled nursing facility for the patient until he is stronger.  Assessment & Plan:   Principal Problem:   Hepatic encephalopathy (HCC) Active Problems:   Portal hypertension with esophageal varices (HCC)   Thrombocytopenia (HCC)   Alcoholic cirrhosis of liver with ascites (HCC)   Depression   Hypokalemia   Hypomagnesemia   Malnutrition of moderate degree   Accelerated junctional rhythm  Hepatic encephalopathy  -Cont Lactulose- he is quite awake and alert but having tremors -Ammonia Level went from 79 -> 102 -Increase Lactulose today to 30 grams TID  Hypokalemia Hypomagnesemia - due to vomiting and diuretics - replacingvia IV and orally - K+ is3.7 today -Mg+ was1.6 last night - try to achieve K+ > 4.0 and Mg+ > 2.0 -Continue to monitor and replete as necessary -Repeat CMP in the a.m. along with mag level  Accelerated Junctional rhythm -Corrected electrolyte  abnormalities- HR is still junctional in 60s -Cave asked for cardiology eval- Cardiology does not feel any further work up is needed as he is asymptomatic and Dr. Butler Denmark discussed with Dr Delton See  - BP stable in 90- 100s- cont Midodrine for this  Vomiting - resolved- advance to soft diet  Chronic hypotension -Continue Midodrine  Alcoholic cirrhosis of liver with ascites  -s/p paracentesis as mentioned above -abdomen is non-tender and not severely distended -no need for another paracentesis- stop Ceftriaxone -Resumed oral diuretics- 1200 fluid restriction added - no longer drinks alcohol  Advanced cirrhosis Portal hypertension with esophageal varices Splenomegaly Elevated INR  -s/p TIPS -Needs to follow up with GI as an outpatient   Thrombocytopenia  -Chronicand stable -likely related to cirrhosis/ splenomegaly -Platelet Count 80,000  Depression/Anxiety -Welbutrin dose has been changed from 150 > 100 mg daily due to poor liver function -Continue with fluoxetine 40 g p.o. daily, and gabapentin 300 mg p.o. nightly -? Wellbutrin causing more tremors  DVT prophylaxis: CDs given thrombocytopenia Code Status: FULL CODE Family Communication: No family present at bedside  Disposition Plan: Likely SNF now as patient is unsafe discharge plan to go home  Consultants:   Cardiology   Procedures:  Paracentesis   Antimicrobials:  Anti-infectives (From admission, onward)   Start     Dose/Rate Route Frequency Ordered Stop   08/16/18 0115  cefTRIAXone (ROCEPHIN) 2 g in sodium chloride 0.9 % 100 mL IVPB  Status:  Discontinued     2 g 200 mL/hr over 30 Minutes Intravenous Daily at bedtime  08/16/18 0058 08/16/18 0906     Subjective: Seen and examined at bedside patient states that he was not feeling good today.  Is having more tremors and shakes.  No chest pain, lightheadedness or dizziness.  No nausea or vomiting but just did not feel well.  Objective: Vitals:   08/18/18  0324 08/18/18 0848 08/18/18 1343 08/18/18 1558  BP: 97/67 110/72 109/78   Pulse: 71 77 79   Resp: 18  16   Temp: 98.7 F (37.1 C)  98 F (36.7 C)   TempSrc: Oral  Oral   SpO2: 97%  98% 96%  Weight:      Height:        Intake/Output Summary (Last 24 hours) at 08/18/2018 1826 Last data filed at 08/18/2018 1641 Gross per 24 hour  Intake 1157 ml  Output 1925 ml  Net -768 ml   Filed Weights   08/15/18 2352  Weight: 61.4 kg   Examination: Physical Exam:  Constitutional: Thin frail cachectic ill-appearing Caucasian male NAD and appears calm but uncomfortable Eyes: Lids and conjunctivae normal, sclerae icteric  ENMT: External Ears, Nose appear normal. Grossly normal hearing. Mucous membranes are moist. Neck: Appears normal, supple, no cervical masses, normal ROM, no appreciable thyromegaly, no JVD Respiratory: Diminished to auscultation bilaterally, no wheezing, rales, rhonchi or crackles. Normal respiratory effort and patient is not tachypenic. No accessory muscle use.  Cardiovascular: RRR, no murmurs / rubs / gallops. S1 and S2 auscultated.  Abdomen: Soft, slightly tender, Somewhat distended.  Bowel sounds positive x4.  GU: Deferred. Musculoskeletal: No clubbing / cyanosis of digits/nails.  Skin: No rashes, lesions, ulcers on a limited skin eval. No induration; Warm and dry.  Neurologic: CN 2-12 grossly intact with no focal deficits.  Romberg sign and cerebellar reflexes not assessed. Having some mild tremors Psychiatric: Normal judgment and insight. Alert and oriented x 3. Anxious mood and appropriate affect.   Data Reviewed: I have personally reviewed following labs and imaging studies  CBC: Recent Labs  Lab 08/15/18 2029 08/16/18 0655 08/17/18 0620 08/18/18 1012  WBC 6.3 5.6 6.1 5.4  NEUTROABS  --   --   --  3.7  HGB 13.5 11.7* 11.9* 12.2*  HCT 40.6 34.9* 37.2* 37.2*  MCV 95.3 95.4 98.9 98.7  PLT 91* 71* 80* 80*   Basic Metabolic Panel: Recent Labs  Lab  08/15/18 2029 08/15/18 2224 08/16/18 0655 08/16/18 1622 08/17/18 0620 08/18/18 1012  NA 139  --  141 140 143 141  K 2.3*  --  3.1* 4.5 4.3 3.7  CL 100  --  106 107 112* 105  CO2 29  --  27 27 26 29   GLUCOSE 108*  --  76 87 78 122*  BUN 7  --  7 8 9 9   CREATININE 0.78  --  0.58* 0.73 0.62 0.65  CALCIUM 8.2*  --  7.8* 8.1* 8.5* 8.7*  MG  --  1.4* 1.8  --  1.8 1.6*  PHOS  --   --   --   --   --  3.3   GFR: Estimated Creatinine Clearance: 88.5 mL/min (by C-G formula based on SCr of 0.65 mg/dL). Liver Function Tests: Recent Labs  Lab 08/15/18 2029 08/16/18 0655 08/18/18 1012  AST 75* 65* 70*  ALT 39 36 37  ALKPHOS 154* 126 137*  BILITOT 5.9* 4.6* 5.0*  PROT 6.4* 5.3* 5.8*  ALBUMIN 2.6* 2.2* 2.4*   Recent Labs  Lab 08/15/18 2029 08/16/18 0655  LIPASE 60*  63*   Recent Labs  Lab 08/15/18 2039 08/18/18 1012  AMMONIA 79* 102*   Coagulation Profile: No results for input(s): INR, PROTIME in the last 168 hours. Cardiac Enzymes: No results for input(s): CKTOTAL, CKMB, CKMBINDEX, TROPONINI in the last 168 hours. BNP (last 3 results) No results for input(s): PROBNP in the last 8760 hours. HbA1C: No results for input(s): HGBA1C in the last 72 hours. CBG: Recent Labs  Lab 08/17/18 2146  GLUCAP 98   Lipid Profile: No results for input(s): CHOL, HDL, LDLCALC, TRIG, CHOLHDL, LDLDIRECT in the last 72 hours. Thyroid Function Tests: No results for input(s): TSH, T4TOTAL, FREET4, T3FREE, THYROIDAB in the last 72 hours. Anemia Panel: No results for input(s): VITAMINB12, FOLATE, FERRITIN, TIBC, IRON, RETICCTPCT in the last 72 hours. Sepsis Labs: No results for input(s): PROCALCITON, LATICACIDVEN in the last 168 hours.  No results found for this or any previous visit (from the past 240 hour(s)).   Radiology Studies: No results found.  Scheduled Meds: . buPROPion  100 mg Oral Daily  . feeding supplement (ENSURE ENLIVE)  237 mL Oral BID BM  . FLUoxetine  40 mg Oral Daily   . gabapentin  300 mg Oral QHS  . lactulose  20 g Oral TID  . midodrine  5 mg Oral TID WC  . multivitamin with minerals  1 tablet Oral Daily  . spironolactone  100 mg Oral Daily   Continuous Infusions:   LOS: 2 days   Merlene Laughter, DO [facility] PAGER is on AMION  If 7PM-7AM, please contact night-coverage [email or web address] Password [facility] 08/18/2018, 6:26 PM

## 2018-08-18 NOTE — Care Management Note (Signed)
Case Management Note  Patient Details  Name: Kenneth [name] MRN: [id number] Date of Birth: 04/06/1961  Subjective/Objective:  Not medically stable for d/c. PT cons, Palliative cons. See prior notes from [facility], & CSW.                  Action/Plan:dc plan SNF.   Expected Discharge Date:  08/17/18               Expected Discharge Plan:  Skilled Nursing Facility  In-House Referral:  Clinical Social Work  Discharge planning Services  CM Consult  Post Acute Care Choice:    Choice offered to:     DME Arranged:    DME Agency:     HH Arranged:    HH Agency:     Status of Service:  In process, will continue to follow  If discussed at Long Length of Stay Meetings, dates discussed:    Additional Comments:  Lanier Clam, RN 08/18/2018, 12:47 PM

## 2018-08-19 LAB — COMPREHENSIVE METABOLIC PANEL
ALBUMIN: 2.3 g/dL — AB (ref 3.5–5.0)
ALT: 35 U/L (ref 0–44)
ANION GAP: 6 (ref 5–15)
AST: 65 U/L — ABNORMAL HIGH (ref 15–41)
Alkaline Phosphatase: 123 U/L (ref 38–126)
BUN: 8 mg/dL (ref 6–20)
CO2: 30 mmol/L (ref 22–32)
Calcium: 8.8 mg/dL — ABNORMAL LOW (ref 8.9–10.3)
Chloride: 106 mmol/L (ref 98–111)
Creatinine, Ser: 0.66 mg/dL (ref 0.61–1.24)
GFR calc Af Amer: >60 mL/min (ref >60)
GFR calc non Af Amer: >60 mL/min (ref >60)
GLUCOSE: 85 mg/dL (ref 70–99)
POTASSIUM: 3.8 mmol/L (ref 3.5–5.1)
SODIUM: 142 mmol/L (ref 135–145)
TOTAL PROTEIN: 5.6 g/dL — AB (ref 6.5–8.1)
Total Bilirubin: 4.7 mg/dL — ABNORMAL HIGH (ref 0.3–1.2)

## 2018-08-19 LAB — CBC WITH DIFFERENTIAL/PLATELET
Abs Immature Granulocytes: 0.04 10*3/uL (ref 0.00–0.07)
BASOS ABS: 0 10*3/uL (ref 0.0–0.1)
Basophils Relative: 1 %
EOS PCT: 3 %
Eosinophils Absolute: 0.2 10*3/uL (ref 0.0–0.5)
HCT: 38 % — ABNORMAL LOW (ref 39.0–52.0)
Hemoglobin: 12.2 g/dL — ABNORMAL LOW (ref 13.0–17.0)
Immature Granulocytes: 1 %
LYMPHS ABS: 0.6 10*3/uL — AB (ref 0.7–4.0)
Lymphocytes Relative: 11 %
MCH: 31.5 pg (ref 26.0–34.0)
MCHC: 32.1 g/dL (ref 30.0–36.0)
MCV: 98.2 fL (ref 80.0–100.0)
MONO ABS: 1.2 10*3/uL — AB (ref 0.1–1.0)
MONOS PCT: 21 %
Neutro Abs: 3.7 10*3/uL (ref 1.7–7.7)
Neutrophils Relative %: 63 %
Platelets: 77 10*3/uL — ABNORMAL LOW (ref 150–400)
RBC: 3.87 MIL/uL — ABNORMAL LOW (ref 4.22–5.81)
RDW: 16.1 % — AB (ref 11.5–15.5)
WBC: 5.8 10*3/uL (ref 4.0–10.5)
nRBC: 0 % (ref 0.0–0.2)

## 2018-08-19 LAB — PHOSPHORUS: Phosphorus: 4 mg/dL (ref 2.5–4.6)

## 2018-08-19 LAB — AMMONIA: Ammonia: 87 umol/L — ABNORMAL HIGH (ref 9–35)

## 2018-08-19 LAB — MAGNESIUM: Magnesium: 1.8 mg/dL (ref 1.7–2.4)

## 2018-08-19 MED ORDER — FUROSEMIDE 40 MG PO TABS
40.0000 mg | ORAL_TABLET | Freq: Every day | ORAL | Status: DC
  Administered 2018-08-19 – 2018-08-26 (×8): 40 mg via ORAL
  Filled 2018-08-19 (×8): qty 1

## 2018-08-19 MED ORDER — LACTULOSE 10 GM/15ML PO SOLN: 45.0000 g | Freq: Three times a day (TID) | ORAL | Status: DC

## 2018-08-19 MED ORDER — LACTULOSE 10 GM/15ML PO SOLN
30.0000 g | Freq: Three times a day (TID) | ORAL | Status: DC
  Administered 2018-08-19 – 2018-08-20 (×4): 30 g via ORAL
  Filled 2018-08-19 (×3): qty 45

## 2018-08-19 MED ORDER — FLUOXETINE HCL 20 MG PO CAPS
20.0000 mg | ORAL_CAPSULE | Freq: Every day | ORAL | Status: DC
  Administered 2018-08-19 – 2018-08-26 (×8): 20 mg via ORAL
  Filled 2018-08-19 (×7): qty 1

## 2018-08-19 NOTE — Progress Notes (Signed)
PROGRESS NOTE    DRURY ARDIZZONE  WUJ:[id number] DOB: [birth year] DOA: 08/15/2018 PCP: [name], MD   Brief Narrative:  [name] a 57 y.o.malewithhistory of hepatic cirrhosis status post TIPS procedure with history of esophageal varices who has had recent paracentesis done 2 weeks ago was brought to the ER after patient's family found patient was increasingly confused. Patient also states that over thepast 4 days patient felt that his abdomen is getting more distendedand he has has episodes of nausea and vomiting.   In ED >Ammonia 79, K 3.1, oriented to self and place, abdomen distended- given IVF and K.  S/p IR guided paracentesis on 10/16, ~ 4 L removed.  PT evaluated and recommending SNF and social work involved for assistance with placement.   Assessment & Plan:   Principal Problem:   Hepatic encephalopathy (HCC) Active Problems:   Portal hypertension with esophageal varices (HCC)   Thrombocytopenia (HCC)   Alcoholic cirrhosis of liver with ascites (HCC)   Depression   Hypokalemia   Hypomagnesemia   Malnutrition of moderate degree   Accelerated junctional rhythm  Hepatic encephalopathy  -Cont Lactulose- he is quite awake and alert but having tremors -Ammonia Level went from 79 -> 102 -> 87 -Increased Lactulose yesterday to 30 grams TID and patient is improving  -Continue to Monitor and repeat Ammonia Level in AM   Hypokalemia Hypomagnesemia -Was due t vomiting and diuretics -Replacingvia IV and orally -K+ is3.8 today -Mg+ was1.8 this AM  -Try to achieve K+ > 4.0 and Mg+ > 2.0 -Continue to monitor and replete as necessary -Repeat CMP in the a.m. along with mag level  Accelerated Junctional rhythm -Corrected electrolyte abnormalities- HR is still junctional in 60s -Cave asked for Cardiology eval- Cardiology does not feel any further work up is needed as he is asymptomatic and Dr. Butler Denmark discussed with Dr Delton See  - BP stable in 90- 100s- cont  Midodrine for this  Vomiting -Resolved- advance to soft diet  Chronic Hypotension -Continue Midodrine  Alcoholic cirrhosis of liver with ascites  -S/p paracentesis as mentioned above -abdomen is non-tender and not severely distended -no need for another paracentesis- stop Ceftriaxone -Resumed oral diuretics with Spironolactone- 1200 fluid restriction added -Resumed Home Lasix at 2/3 of the Home dose and started 40 mg po Daily  - no longer drinks alcohol -Palliative care consulted for goals of care discussion  Advanced Cirrhosis Portal hypertension with esophageal varices Splenomegaly Elevated INR  Hyperbilirubinemia  Abnormal LFTs -s/p TIPS -Needs to follow up with GI as an outpatient  -As above  Thrombocytopenia  -Chronicand stable -likely related to cirrhosis/ splenomegaly -Platelet Count 77,000  Depression/Anxiety -Welbutrin dose has been changed from 150 > 100 mg daily due to poor liver function -Continue with fluoxetine 40 g p.o. daily, and gabapentin 300 mg p.o. nightly -? Wellbutrin causing more tremors  Normocytic Anemia -Since hemoglobin/hematocrit is stable at 12.2/38.0 -In the setting of liver cirrhosis -Continue monitor for signs and symptoms of bleeding -Repeat CBC in the a.m.  DVT prophylaxis: SCDs given thrombocytopenia Code Status: FULL CODE Family Communication: No family present at bedside  Disposition Plan: Likely SNF now as patient is unsafe discharge plan to go home  Consultants:   Cardiology  Palliative care medicine   Procedures:  Paracentesis   Antimicrobials:  Anti-infectives (From admission, onward)   Start     Dose/Rate Route Frequency Ordered Stop   08/16/18 0115  cefTRIAXone (ROCEPHIN) 2 g in sodium chloride 0.9 % 100 mL IVPB  Status:  Discontinued     2 g 200 mL/hr over 30 Minutes Intravenous Daily at bedtime 08/16/18 0058 08/16/18 0906     Subjective: Seen and examined at bedside patient states that he is  feeling better. Not as tremulous today.  Is having bowel movements and states that he had 3 bowel movements.  No chest pain, lightheadedness or dizziness.  Abdomen is not as distended.  Patient was ambulating with physical therapy earlier.  No nausea or vomiting today.  No other concerns or complaints at this time  Objective: Vitals:   08/19/18 0528 08/19/18 0900 08/19/18 1126 08/19/18 1347  BP: 106/73 99/67  102/73  Pulse: 75 74  70  Resp: 16   18  Temp: 98.6 F (37 C)   98.2 F (36.8 C)  TempSrc: Oral   Oral  SpO2: 97% 98% 97% 100%  Weight:      Height:        Intake/Output Summary (Last 24 hours) at 08/19/2018 1458 Last data filed at 08/19/2018 0530 Gross per 24 hour  Intake 483.4 ml  Output 1075 ml  Net -591.6 ml   Filed Weights   08/15/18 2352  Weight: 61.4 kg   Examination: Physical Exam:  Constitutional: Frail cachectic chronically ill-appearing Caucasian male is currently no acute distress appears calm and more comfortable today. Eyes: Lids and conjunctive are normal.  Sclera anicteric.  ENMT: External ears and nose appear normal.  Grossly normal hearing.  Mucous members are moist Neck: Appears supple no JVD Respiratory: Diminished to auscultation bilaterally no appreciable wheezing, rales, rhonchi.  Patient not tachypneic wheezing excess muscle breathe Cardiovascular: Rate and rhythm.  No appreciable murmurs, rubs, gallops. Abdomen: Soft, slightly tender.  Slightly distended.  Does have a umbilical hernia.  Bowel sounds present x4 GU: Deferred Musculoskeletal: No contractures or cyanosis.  No joint deformities noted Skin: Skin is warm and dry.  No appreciable rashes or lesions limited skin evaluation Neurologic: Cranial nerves II through XII grossly intact.  Tremors have improved somewhat.  Romberg sign and cerebellar reflexes were not assessed Psychiatric: Normal judgment and insight.  Patient is awake, alert and oriented x3.  Remains slightly anxious  Data  Reviewed: I have personally reviewed following labs and imaging studies  CBC: Recent Labs  Lab 08/15/18 2029 08/16/18 0655 08/17/18 0620 08/18/18 1012 08/19/18 0542  WBC 6.3 5.6 6.1 5.4 5.8  NEUTROABS  --   --   --  3.7 3.7  HGB 13.5 11.7* 11.9* 12.2* 12.2*  HCT 40.6 34.9* 37.2* 37.2* 38.0*  MCV 95.3 95.4 98.9 98.7 98.2  PLT 91* 71* 80* 80* 77*   Basic Metabolic Panel: Recent Labs  Lab 08/15/18 2224 08/16/18 0655 08/16/18 1622 08/17/18 0620 08/18/18 1012 08/19/18 0542  NA  --  141 140 143 141 142  K  --  3.1* 4.5 4.3 3.7 3.8  CL  --  106 107 112* 105 106  CO2  --  27 27 26 29 30   GLUCOSE  --  76 87 78 122* 85  BUN  --  7 8 9 9 8   CREATININE  --  0.58* 0.73 0.62 0.65 0.66  CALCIUM  --  7.8* 8.1* 8.5* 8.7* 8.8*  MG 1.4* 1.8  --  1.8 1.6* 1.8  PHOS  --   --   --   --  3.3 4.0   GFR: Estimated Creatinine Clearance: 88.5 mL/min (by C-G formula based on SCr of 0.66 mg/dL). Liver Function Tests: Recent Labs  Lab  08/15/18 2029 08/16/18 0655 08/18/18 1012 08/19/18 0542  AST 75* 65* 70* 65*  ALT 39 36 37 35  ALKPHOS 154* 126 137* 123  BILITOT 5.9* 4.6* 5.0* 4.7*  PROT 6.4* 5.3* 5.8* 5.6*  ALBUMIN 2.6* 2.2* 2.4* 2.3*   Recent Labs  Lab 08/15/18 2029 08/16/18 0655  LIPASE 60* 63*   Recent Labs  Lab 08/15/18 2039 08/18/18 1012 08/19/18 0831  AMMONIA 79* 102* 87*   Coagulation Profile: No results for input(s): INR, PROTIME in the last 168 hours. Cardiac Enzymes: No results for input(s): CKTOTAL, CKMB, CKMBINDEX, TROPONINI in the last 168 hours. BNP (last 3 results) No results for input(s): PROBNP in the last 8760 hours. HbA1C: No results for input(s): HGBA1C in the last 72 hours. CBG: Recent Labs  Lab 08/17/18 2146  GLUCAP 98   Lipid Profile: No results for input(s): CHOL, HDL, LDLCALC, TRIG, CHOLHDL, LDLDIRECT in the last 72 hours. Thyroid Function Tests: No results for input(s): TSH, T4TOTAL, FREET4, T3FREE, THYROIDAB in the last 72  hours. Anemia Panel: No results for input(s): VITAMINB12, FOLATE, FERRITIN, TIBC, IRON, RETICCTPCT in the last 72 hours. Sepsis Labs: No results for input(s): PROCALCITON, LATICACIDVEN in the last 168 hours.  No results found for this or any previous visit (from the past 240 hour(s)).   Radiology Studies: No results found.  Scheduled Meds: . buPROPion  100 mg Oral Daily  . feeding supplement (ENSURE ENLIVE)  237 mL Oral BID BM  . FLUoxetine  20 mg Oral Daily  . furosemide  40 mg Oral Daily  . gabapentin  300 mg Oral QHS  . lactulose  30 g Oral TID  . midodrine  5 mg Oral TID WC  . multivitamin with minerals  1 tablet Oral Daily  . spironolactone  100 mg Oral Daily   Continuous Infusions: . sodium chloride Stopped (08/18/18 2035)    LOS: 3 days   Merlene Laughter, DO [facility] PAGER is on AMION  If 7PM-7AM, please contact night-coverage [email or web address] Password [email or web address] 08/19/2018, 2:58 PM

## 2018-08-19 NOTE — Evaluation (Signed)
Physical Therapy Evaluation Patient Details Name: Kenneth [name] MRN: [id number] DOB: [birth year] Today's Date: 08/19/2018   History of Present Illness  57 yo male admitted with hepatic encephalopathy, ascites. Hx of hepatic cirrhosis, polysubstance abuse  Clinical Impression  On eval, pt required Min assist for mobility. He walked ~125 feet with a RW. Noted intermittent UE jerking. R side leaning and LOB during ambulation. Gait is mildly ataxic. He is at risk for falls when mobilizing. Per chart, pt does not have a safe d/c plan. Will recommend SNF at this time. Will follow and progress activity as tolerated.     Follow Up Recommendations SNF    Equipment Recommendations  None recommended by PT    Recommendations for Other Services       Precautions / Restrictions Precautions Precautions: Fall Restrictions Weight Bearing Restrictions: No      Mobility  Bed Mobility Overal bed mobility: Needs Assistance Bed Mobility: Supine to Sit;Sit to Supine     Supine to sit: Supervision Sit to supine: Supervision   General bed mobility comments: cues for safety, technique.   Transfers Overall transfer level: Needs assistance Equipment used: Rolling walker (2 wheeled) Transfers: Sit to/from Stand Sit to Stand: Min assist         General transfer comment: Assist to rise, stabilize, control descent. VCs safety, hand placement. Unsteady.   Ambulation/Gait Ambulation/Gait assistance: Min assist Gait Distance (Feet): 125 Feet Assistive device: Rolling walker (2 wheeled) Gait Pattern/deviations: Step-through pattern;Ataxic;Decreased stride length     General Gait Details: Asssist to stabilize pt throughout distance and to manuever with RW. R side leaning and LOB intermittently. Pt tolerated distance well. He c/o mild fatigue.   Stairs            Wheelchair Mobility    Modified Rankin (Stroke Patients Only)       Balance Overall balance assessment: Needs  assistance         Standing balance support: Bilateral upper extremity supported Standing balance-Leahy Scale: Poor Standing balance comment: at risk for falls                             Pertinent Vitals/Pain Pain Assessment: No/denies pain    Home Living Family/patient expects to be discharged to:: Private residence Living Arrangements: Parent;Other relatives   Type of Home: House         Home Equipment: None      Prior Function Level of Independence: Independent               Hand Dominance        Extremity/Trunk Assessment   Upper Extremity Assessment Upper Extremity Assessment: Defer to OT evaluation    Lower Extremity Assessment Lower Extremity Assessment: Generalized weakness    Cervical / Trunk Assessment Cervical / Trunk Assessment: Normal  Communication   Communication: No difficulties  Cognition Arousal/Alertness: Awake/alert Behavior During Therapy: WFL for tasks assessed/performed Overall Cognitive Status: Within Functional Limits for tasks assessed                                        General Comments      Exercises     Assessment/Plan    PT Assessment Patient needs continued PT services  PT Problem List Decreased strength;Decreased mobility;Decreased activity tolerance;Decreased balance;Decreased knowledge of use of DME;Decreased coordination  PT Treatment Interventions DME instruction;Functional mobility training;Gait training;Therapeutic activities;Balance training;Patient/family education;Therapeutic exercise    PT Goals (Current goals can be found in the Care Plan section)  Acute Rehab PT Goals Patient Stated Goal: none stated PT Goal Formulation: With patient Time For Goal Achievement: 09/02/18 Potential to Achieve Goals: Good    Frequency Min 3X/week   Barriers to discharge Decreased caregiver support      Co-evaluation               AM-PAC PT "6 Clicks" Daily  Activity  Outcome Measure Difficulty turning over in bed (including adjusting bedclothes, sheets and blankets)?: None Difficulty moving from lying on back to sitting on the side of the bed? : None Difficulty sitting down on and standing up from a chair with arms (e.g., wheelchair, bedside commode, etc,.)?: Unable Help needed moving to and from a bed to chair (including a wheelchair)?: A Little Help needed walking in hospital room?: A Little Help needed climbing 3-5 steps with a railing? : A Lot 6 Click Score: 17    End of Session Equipment Utilized During Treatment: Gait belt Activity Tolerance: Patient tolerated treatment well Patient left: in bed;with call bell/phone within reach;with bed alarm set   PT Visit Diagnosis: Muscle weakness (generalized) (M62.81);Difficulty in walking, not elsewhere classified (R26.2);Ataxic gait (R26.0)    Time: 1610-9604 PT Time Calculation (min) (ACUTE ONLY): 16 min   Charges:   PT Evaluation $PT Eval Moderate Complexity: 1 Mod            Rebeca Alert, PT Acute Rehabilitation Services Pager: [phone] Office: [phone]

## 2018-08-19 NOTE — Care Management Note (Signed)
Case Management Note  Patient Details  Name: Kenneth [name] MRN: [id number] Date of Birth: 03/15/1961  Subjective/Objective:  PT recc SNF. See CSW note-following for SNF.                  Action/Plan:d/c SNF.   Expected Discharge Date:  08/17/18               Expected Discharge Plan:  Skilled Nursing Facility  In-House Referral:  Clinical Social Work  Discharge planning Services  CM Consult  Post Acute Care Choice:    Choice offered to:     DME Arranged:    DME Agency:     HH Arranged:    HH Agency:     Status of Service:  In process, will continue to follow  If discussed at Long Length of Stay Meetings, dates discussed:    Additional Comments:  Lanier Clam, RN 08/19/2018, 12:45 PM

## 2018-08-19 NOTE — Progress Notes (Signed)
CSW completed patient's FL2 and initiated patient's PASRR. Patient's PASRR currently under manual review MUST ID [id number]. Patient's son agreed to start [facility] term care medicaid and disability application for patient. CSW will continue to follow and seek placement for patient (2 week letter of guarantee).  Celso Sickle, [place] Clinical Social Worker [facility] Cell#: [phone]

## 2018-08-19 NOTE — Plan of Care (Signed)
  Problem: Education: Goal: Knowledge of General Education information will improve Description: Including pain rating scale, medication(s)/side effects and non-pharmacologic comfort measures Outcome: Progressing   Problem: Health Behavior/Discharge Planning: Goal: Ability to manage health-related needs will improve Outcome: Progressing   Problem: Clinical Measurements: Goal: Ability to maintain clinical measurements within normal limits will improve Outcome: Progressing Goal: Will remain free from infection Outcome: Progressing Goal: Diagnostic test results will improve Outcome: Progressing Goal: Respiratory complications will improve Outcome: Progressing Goal: Cardiovascular complication will be avoided Outcome: Progressing   Problem: Activity: Goal: Risk for activity intolerance will decrease Outcome: Progressing   Problem: Nutrition: Goal: Adequate nutrition will be maintained Outcome: Progressing   Problem: Coping: Goal: Level of anxiety will decrease Outcome: Progressing   Problem: Elimination: Goal: Will not experience complications related to urinary retention Outcome: Progressing   Problem: Pain Managment: Goal: General experience of comfort will improve Outcome: Progressing   Problem: Skin Integrity: Goal: Risk for impaired skin integrity will decrease Outcome: Progressing   

## 2018-08-19 NOTE — [place] FL2 (Signed)
[place] MEDICAID FL2 LEVEL OF CARE SCREENING TOOL     IDENTIFICATION  Patient Name: Kenneth [name] Birthdate: November 11, 1960 Sex: male Admission Date (Current Location): 08/15/2018  [facility] and [place] Number:  Producer, television/film/video and Address:  [facility],  [address] [place], [place] [address]      Provider Number: [id number]  Attending Physician Name and Address:  Merlene Laughter, DO  Relative Name and Phone Number:       Current Level of Care: Hospital Recommended Level of Care: Skilled Nursing Facility Prior Approval Number:    Date Approved/Denied:   PASRR Number: pending  Discharge Plan: SNF    Current Diagnoses: Patient Active Problem List   Diagnosis Date Noted  . Malnutrition of moderate degree 08/17/2018  . Accelerated junctional rhythm 08/17/2018  . Hypokalemia 08/16/2018  . Hypomagnesemia 08/16/2018  . Hepatic encephalopathy (HCC) 08/15/2018  . Peripheral neuropathy 08/02/2018  . Ascites due to alcoholic cirrhosis (HCC) 05/19/2018  . Depression 12/18/2016  . Testosterone deficiency in male 08/11/2016  . Inguinal hernia 02/28/2016  . Rectal bleed 01/24/2016  . Umbilical hernia 01/24/2016  . Tobacco abuse 01/24/2016  . Gynecomastia, male 01/24/2016  . Esophageal varices (HCC) 09/02/2014  . Portal hypertension with esophageal varices (HCC) 09/02/2014  . Hiatal hernia 09/02/2014  . Leukopenia 09/02/2014  . Thrombocytopenia (HCC) 09/02/2014  . Alcoholic cirrhosis of liver with ascites (HCC)     Orientation RESPIRATION BLADDER Height & Weight     Self, Time, Situation, Place  Normal Continent Weight: 135 lb 5.8 oz (61.4 kg) Height:  5\' 7"  (170.2 cm)  BEHAVIORAL SYMPTOMS/MOOD NEUROLOGICAL BOWEL NUTRITION STATUS        Diet(see discharge summary)  AMBULATORY STATUS COMMUNICATION OF NEEDS Skin   Limited Assist Verbally Normal                       Personal Care Assistance Level of Assistance  Bathing, Feeding, Dressing Bathing  Assistance: Maximum assistance Feeding assistance: Independent Dressing Assistance: Maximum assistance     Functional Limitations Info  Sight, Hearing, Speech Sight Info: Adequate Hearing Info: Adequate Speech Info: Adequate    SPECIAL CARE FACTORS FREQUENCY  PT (By licensed PT)     PT Frequency: Min 3x/week              Contractures      Additional Factors Info  Code Status, Allergies Code Status Info: Full Code Allergies Info: Latex           Current Medications (08/19/2018):  This is the current hospital active medication list Current Facility-Administered Medications  Medication Dose Route Frequency Provider Last Rate Last Dose  . 0.9 %  sodium chloride infusion   Intravenous PRN Marguerita Merles [place], DO   Stopped at 08/18/18 2035  . buPROPion (WELLBUTRIN SR) 12 hr tablet 100 mg  100 mg Oral Daily Calvert Cantor, MD   100 mg at 08/18/18 0950  . feeding supplement (ENSURE ENLIVE) (ENSURE ENLIVE) liquid 237 mL  237 mL Oral BID BM Eduard Clos, MD   237 mL at 08/18/18 1636  . FLUoxetine (PROZAC) capsule 20 mg  20 mg Oral Daily Sheikh, Omair Latif, DO      . furosemide (LASIX) tablet 40 mg  40 mg Oral Daily Sheikh, Omair Latif, DO      . gabapentin (NEURONTIN) capsule 300 mg  300 mg Oral QHS Eduard Clos, MD   300 mg at 08/18/18 2225  . lactulose (CHRONULAC)  10 GM/15ML solution 30 g  30 g Oral TID Marguerita Merles Latif, DO      . midodrine (PROAMATINE) tablet 5 mg  5 mg Oral TID WC Eduard Clos, MD   5 mg at 08/19/18 1011  . multivitamin with minerals tablet 1 tablet  1 tablet Oral Daily Calvert Cantor, MD   1 tablet at 08/18/18 0950  . ondansetron (ZOFRAN) tablet 4 mg  4 mg Oral Q6H PRN Eduard Clos, MD       Or  . ondansetron [facility]) injection 4 mg  4 mg Intravenous Q6H PRN Eduard Clos, MD   4 mg at 08/18/18 1243  . spironolactone (ALDACTONE) tablet 100 mg  100 mg Oral Daily Calvert Cantor, MD   100 mg at 08/18/18 1610      Discharge Medications: Please see discharge summary for a list of discharge medications.  Relevant Imaging Results:  Relevant Lab Results:   Additional Information SSN [id number]  Antionette Poles, LCSW

## 2018-08-19 NOTE — Evaluation (Signed)
Occupational Therapy Evaluation Patient Details Name: Kenneth [name] MRN: [id number] DOB: 06/10/1961 Today's Date: 08/19/2018    History of Present Illness 57 yo male admitted with hepatic encephalopathy, ascites. Hx of hepatic cirrhosis, polysubstance abuse   Clinical Impression   Pt admitted with the above. Pt currently with functional limitations due to the deficits listed below (see OT Problem List).  Pt will benefit from skilled OT to increase their safety and independence with ADL and functional mobility for ADL to facilitate discharge to venue listed below.      Follow Up Recommendations  SNF    Equipment Recommendations  None recommended by OT    Recommendations for Other Services       Precautions / Restrictions Precautions Precautions: Fall Restrictions Weight Bearing Restrictions: No      Mobility Bed Mobility Overal bed mobility: Needs Assistance Bed Mobility: Supine to Sit;Sit to Supine     Supine to sit: Supervision Sit to supine: Supervision   General bed mobility comments: cues for safety, technique.   Transfers Overall transfer level: Needs assistance Equipment used: Rolling walker (2 wheeled) Transfers: Sit to/from Stand Sit to Stand: Min assist         General transfer comment: Assist to rise, stabilize, control descent. VCs safety, hand placement. Unsteady.     Balance Overall balance assessment: Needs assistance         Standing balance support: Bilateral upper extremity supported Standing balance-Leahy Scale: Poor Standing balance comment: at risk for falls                           ADL either performed or assessed with clinical judgement   ADL Overall ADL's : Needs assistance/impaired                                       General ADL Comments: limited eval as pt became sick after getting to chair and begain throwingup                  Pertinent Vitals/Pain Pain Assessment: No/denies pain      Hand Dominance     Extremity/Trunk Assessment Upper Extremity Assessment Upper Extremity Assessment: Generalized weakness   Lower Extremity Assessment Lower Extremity Assessment: Generalized weakness   Cervical / Trunk Assessment Cervical / Trunk Assessment: Normal   Communication Communication Communication: No difficulties   Cognition Arousal/Alertness: Awake/alert Behavior During Therapy: WFL for tasks assessed/performed Overall Cognitive Status: Within Functional Limits for tasks assessed                                                Home Living Family/patient expects to be discharged to:: Skilled nursing facility Living Arrangements: Parent;Other relatives   Type of Home: House                       Home Equipment: None          Prior Functioning/Environment Level of Independence: Independent                 OT Problem List: Decreased strength;Impaired balance (sitting and/or standing);Decreased safety awareness      OT Treatment/Interventions: Self-care/ADL training;Patient/family education;DME and/or AE instruction    OT Goals(Current goals  can be found in the care plan section) Acute Rehab OT Goals Patient Stated Goal: none stated  OT Frequency: Min 2X/week   Barriers to D/C:               AM-PAC PT "6 Clicks" Daily Activity     Outcome Measure Help from another person eating meals?: A Little Help from another person taking care of personal grooming?: A Little Help from another person toileting, which includes using toliet, bedpan, or urinal?: A Little Help from another person bathing (including washing, rinsing, drying)?: A Lot Help from another person to put on and taking off regular upper body clothing?: A Little Help from another person to put on and taking off regular lower body clothing?: A Lot 6 Click Score: 16   End of Session Equipment Utilized During Treatment: Rolling walker Nurse  Communication: Mobility status  Activity Tolerance: Other (comment)(limited by vomiting) Patient left: in chair;with call bell/phone within reach;with nursing/sitter in room;with chair alarm set  OT Visit Diagnosis: Unsteadiness on feet (R26.81);Muscle weakness (generalized) (M62.81);Other abnormalities of gait and mobility (R26.89);History of falling (Z91.81);Repeated falls (R29.6)                Time: 1610-9604 OT Time Calculation (min): 12 min Charges:  OT General Charges $OT Visit: 1 Visit OT Evaluation $OT Eval Moderate Complexity: 1 Mod  Lise Auer, OT Acute Rehabilitation Services [phone] Office- [phone]     Areliz Rothman, Karin Golden D 08/19/2018, 1:38 PM

## 2018-08-20 LAB — CBC WITH DIFFERENTIAL/PLATELET
Abs Immature Granulocytes: 0.02 10*3/uL (ref 0.00–0.07)
BASOS ABS: 0.1 10*3/uL (ref 0.0–0.1)
BASOS PCT: 1 %
EOS ABS: 0.2 10*3/uL (ref 0.0–0.5)
Eosinophils Relative: 3 %
HCT: 39.5 % (ref 39.0–52.0)
Hemoglobin: 12.8 g/dL — ABNORMAL LOW (ref 13.0–17.0)
IMMATURE GRANULOCYTES: 0 %
Lymphocytes Relative: 14 %
Lymphs Abs: 1 10*3/uL (ref 0.7–4.0)
MCH: 31.3 pg (ref 26.0–34.0)
MCHC: 32.4 g/dL (ref 30.0–36.0)
MCV: 96.6 fL (ref 80.0–100.0)
Monocytes Absolute: 1.1 10*3/uL — ABNORMAL HIGH (ref 0.1–1.0)
Monocytes Relative: 16 %
NRBC: 0 % (ref 0.0–0.2)
Neutro Abs: 4.7 10*3/uL (ref 1.7–7.7)
Neutrophils Relative %: 66 %
PLATELETS: 84 10*3/uL — AB (ref 150–400)
RBC: 4.09 MIL/uL — ABNORMAL LOW (ref 4.22–5.81)
RDW: 16.1 % — AB (ref 11.5–15.5)
WBC: 7 10*3/uL (ref 4.0–10.5)

## 2018-08-20 LAB — COMPREHENSIVE METABOLIC PANEL
ALBUMIN: 2.5 g/dL — AB (ref 3.5–5.0)
ALT: 36 U/L (ref 0–44)
AST: 72 U/L — AB (ref 15–41)
Alkaline Phosphatase: 146 U/L — ABNORMAL HIGH (ref 38–126)
Anion gap: 9 (ref 5–15)
BUN: 8 mg/dL (ref 6–20)
CHLORIDE: 103 mmol/L (ref 98–111)
CO2: 30 mmol/L (ref 22–32)
Calcium: 8.9 mg/dL (ref 8.9–10.3)
Creatinine, Ser: 0.57 mg/dL — ABNORMAL LOW (ref 0.61–1.24)
GFR calc Af Amer: >60 mL/min (ref >60)
GFR calc non Af Amer: >60 mL/min (ref >60)
GLUCOSE: 87 mg/dL (ref 70–99)
POTASSIUM: 3.6 mmol/L (ref 3.5–5.1)
Sodium: 142 mmol/L (ref 135–145)
Total Bilirubin: 5.7 mg/dL — ABNORMAL HIGH (ref 0.3–1.2)
Total Protein: 5.8 g/dL — ABNORMAL LOW (ref 6.5–8.1)

## 2018-08-20 LAB — MAGNESIUM: MAGNESIUM: 1.9 mg/dL (ref 1.7–2.4)

## 2018-08-20 LAB — AMMONIA: Ammonia: 90 umol/L — ABNORMAL HIGH (ref 9–35)

## 2018-08-20 LAB — PHOSPHORUS: Phosphorus: 3.9 mg/dL (ref 2.5–4.6)

## 2018-08-20 MED ORDER — LACTULOSE ENEMA
300.0000 mL | Freq: Every day | ORAL | Status: DC
  Administered 2018-08-20 – 2018-08-22 (×2): 300 mL via RECTAL
  Filled 2018-08-20 (×3): qty 300

## 2018-08-20 MED ORDER — LACTULOSE 10 GM/15ML PO SOLN
45.0000 g | Freq: Three times a day (TID) | ORAL | Status: DC
  Administered 2018-08-20 – 2018-08-26 (×19): 45 g via ORAL
  Filled 2018-08-20 (×2): qty 90
  Filled 2018-08-20: qty 75
  Filled 2018-08-20 (×2): qty 90
  Filled 2018-08-20 (×2): qty 75
  Filled 2018-08-20: qty 90
  Filled 2018-08-20 (×6): qty 75
  Filled 2018-08-20: qty 90
  Filled 2018-08-20 (×6): qty 75
  Filled 2018-08-20: qty 90

## 2018-08-20 MED ORDER — PROMETHAZINE HCL 25 MG/ML IJ SOLN: 12.5000 mg | Freq: Four times a day (QID) | INTRAMUSCULAR | Status: DC | PRN

## 2018-08-20 NOTE — Progress Notes (Signed)
Nutrition Follow-up  DOCUMENTATION CODES:   Non-severe (moderate) malnutrition in context of chronic illness  INTERVENTION:  - Continue Ensure Enlive BID. - Continue to encourage PO intakes. - Will continue to monitor for nutrition-related needs.    NUTRITION DIAGNOSIS:   Moderate Malnutrition related to chronic illness(ETOH cirrhosis) as evidenced by percent weight loss, moderate fat depletion, severe muscle depletion. -ongoing  GOAL:   Patient will meet greater than or equal to 90% of their needs -beginning to meet on average.  MONITOR:   PO intake, Supplement acceptance, Weight trends, Labs, I & O's  ASSESSMENT:   57 y.o. male with history of hepatic cirrhosis status post TIPS procedure with history of esophageal varices who has had recent paracentesis done 2 weeks ago was brought to the ER after patient's family found patient was increasingly confused.  Patient also states that over the past 4 days patient felt that his abdomen is getting more distended and he has has episodes of nausea and vomiting.   No weight since admission on 10/20. Patient is now a/o and reports good appetite and intakes. He denies any abdominal pain or nausea with PO intakes and denies chewing or swallowing difficulties, but seems to prefer softer, easier-to-chew items. He has enjoyed [employer] and has consumed nearly all of the bottles provided to him.  Per Dr. Weston Settle note yesterday afternoon: Hepatic encephalopathy to continue lactulose, ongoing tremors, accelerated junctional rhythm with electrolyte abnormalities corrected, vomiting--resolved, advancedalcoholic cirrhosis with ascites, portal HTN, and esophageal varices--s/p TIPS. Note states that patient will likely go to SNF as he is unsafe to return home.     Medications reviewed; 40 mg oral Lasix/day, 45 g oral lactulose TID, 300 ml rectal lactulose/day, daily multivitamin with minerals, 100 mg aldactone/day.  Labs reviewed; creatinine: 0.57  mg/dL, Alk Phos elevated, ammonia: 90 umol/L.      Diet Order:   Diet Order            Diet - low sodium heart healthy        DIET SOFT Room service appropriate? Yes; Fluid consistency: Thin; Fluid restriction: 1200 mL Fluid  Diet effective now              EDUCATION NEEDS:   Not appropriate for education at this time  Skin:  Skin Assessment: Reviewed RN Assessment  Last BM:  PTA  Height:   Ht Readings from Last 1 Encounters:  08/15/18 '5\' 7"'  (1.702 m)    Weight:   Wt Readings from Last 1 Encounters:  08/15/18 61.4 kg    Ideal Body Weight:  67.3 kg  BMI:  Body mass index is 21.2 kg/m.  Estimated Nutritional Needs:   Kcal:  1850-2050  Protein:  90-100g  Fluid:  Per MD     Jarome Matin, MS, RD, LDN, CNSC Inpatient Clinical Dietitian Pager # [phone] After hours/weekend pager # [phone]

## 2018-08-20 NOTE — Progress Notes (Signed)
Physical Therapy Treatment Patient Details Name: Kenneth [name] MRN: [id number] DOB: Jul 31, 1961 Today's Date: 08/20/2018    History of Present Illness 57 yo male admitted with hepatic encephalopathy, ascites. Hx of hepatic cirrhosis, polysubstance abuse    PT Comments    Pt c/o nausea during session. He required more cues on today. Remains very unsteady and at high risk for falls. Continue to recommend SNF.    Follow Up Recommendations  SNF     Equipment Recommendations  None recommended by PT    Recommendations for Other Services       Precautions / Restrictions Precautions Precautions: Fall Restrictions Weight Bearing Restrictions: No    Mobility  Bed Mobility Overal bed mobility: Needs Assistance Bed Mobility: Supine to Sit;Sit to Supine     Supine to sit: Min assist Sit to supine: Min assist   General bed mobility comments: Assist for trunk and LEs. Increased time. Multimodal cueing required.   Transfers Overall transfer level: Needs assistance Equipment used: 4-wheeled walker Transfers: Sit to/from Stand Sit to Stand: Min assist         General transfer comment: Assist to rise, stabilize, control descent. VCs safety, hand placement. Unsteady.   Ambulation/Gait Ambulation/Gait assistance: Min assist Gait Distance (Feet): 75 Feet Assistive device: 4-wheeled walker Gait Pattern/deviations: Step-through pattern;Decreased stride length     General Gait Details: Asssist to stabilize pt throughout distance and to manuever with RW. Pt c/o fatigue and nausea   Stairs             Wheelchair Mobility    Modified Rankin (Stroke Patients Only)       Balance Overall balance assessment: Needs assistance         Standing balance support: Bilateral upper extremity supported Standing balance-Leahy Scale: Poor                              Cognition Arousal/Alertness: Awake/alert Behavior During Therapy: Flat affect Overall  Cognitive Status: Impaired/Different from baseline Area of Impairment: Problem solving;Following commands;Safety/judgement                       Following Commands: Follows one step commands with increased time Safety/Judgement: Decreased awareness of safety   Problem Solving: Slow processing;Difficulty sequencing;Requires verbal cues;Requires tactile cues        Exercises      General Comments        Pertinent Vitals/Pain Pain Assessment: No/denies pain    Home Living                      Prior Function            PT Goals (current goals can now be found in the care plan section) Progress towards PT goals: Progressing toward goals    Frequency    Min 2X/week      PT Plan Frequency needs to be updated;Current plan remains appropriate    Co-evaluation              AM-PAC PT "6 Clicks" Daily Activity  Outcome Measure  Difficulty turning over in bed (including adjusting bedclothes, sheets and blankets)?: None Difficulty moving from lying on back to sitting on the side of the bed? : Unable Difficulty sitting down on and standing up from a chair with arms (e.g., wheelchair, bedside commode, etc,.)?: Unable Help needed moving to and from a bed to chair (including a wheelchair)?: A  Little Help needed walking in hospital room?: A Little Help needed climbing 3-5 steps with a railing? : A Lot 6 Click Score: 14    End of Session Equipment Utilized During Treatment: Gait belt Activity Tolerance: Patient limited by fatigue(limited by nausea) Patient left: in bed;with call bell/phone within reach;with bed alarm set   PT Visit Diagnosis: Muscle weakness (generalized) (M62.81);Difficulty in walking, not elsewhere classified (R26.2)     Time: 1610-9604 PT Time Calculation (min) (ACUTE ONLY): 10 min  Charges:  $Gait Training: 8-22 mins                        Rebeca Alert, PT Acute Rehabilitation Services Pager: [phone] Office:  [phone]

## 2018-08-20 NOTE — Progress Notes (Signed)
PROGRESS NOTE    Kenneth [name]  ZOX:[id number] DOB: [birth year] DOA: 08/15/2018 PCP: [name], MD   Brief Narrative:  Kenneth [name] Her a 57 y.o.malewithhistory of hepatic cirrhosis status post TIPS procedure with history of esophageal varices who has had recent paracentesis done 2 weeks ago was brought to the ER after patient's family found patient was increasingly confused. Patient also states that over thepast 4 days patient felt that his abdomen is getting more distendedand he has has episodes of nausea and vomiting.   In ED >Ammonia 79, K 3.1, oriented to self and place, abdomen distended- given IVF and K.   S/p IR guided paracentesis on 10/16, ~ 4 L removed.  PT evaluated and recommending SNF and social work involved for assistance with placement.   Patient today remained somewhat confused and per nurse, has not had a bowel movement.  Will start lactulose retention enemas as well as increasing p.o. lactulose currently.  Assessment & Plan:   Principal Problem:   Hepatic encephalopathy (HCC) Active Problems:   Portal hypertension with esophageal varices (HCC)   Thrombocytopenia (HCC)   Alcoholic cirrhosis of liver with ascites (HCC)   Depression   Hypokalemia   Hypomagnesemia   Malnutrition of moderate degree   Accelerated junctional rhythm  Hepatic Encephalopathy  -Cont Lactulose- he was awake today but very confused -Ammonia Level went from 79 -> 102 -> 87 -> 90 -Increased Lactulose to 30 grams TID and patient was improving but now is more confused so we will increase again to 45 g p.o. 3 times daily -Started the patient on lactulose enemas and have ordered 1 300 mL rectal now -Continue to Monitor and repeat Ammonia Level in AM   Hypokalemia, improved Hypomagnesemia, improved -Was due t vomiting and diuretics -Replacingvia IV and orally -K+ is3.6 today -Mg+ was1.9 this AM  -Try to achieve K+ > 4.0 and Mg+ > 2.0 -Continue to monitor and replete as  necessary -Repeat CMP in the a.m. along with mag level  Accelerated Junctional rhythm -Corrected electrolyte abnormalities- HR is still junctional in 60s -Cave asked for Cardiology eval- Cardiology does not feel any further work up is needed as he is asymptomatic and Dr. Butler Denmark discussed with Dr Delton See  - BP stable in 90- 100s- cont Midodrine for this  Vomiting -Resolved- advanced to soft diet  Chronic Hypotension -Continue Midodrine 5 mg p.o. 3 times daily with meals  Alcoholic cirrhosis of liver with ascites  -S/p paracentesis as mentioned above -abdomen is non-tender and not severely distended -no need for another paracentesis- stop Ceftriaxone -Resumed oral diuretics with Spironolactone- 1200 fluid restriction added -Resumed Home Lasix at 2/3 of the Home dose and started 40 mg po Daily and will continue for now - no longer drinks alcohol -Palliative care consulted for goals of care discussion appreciate palliative evaluation  Advanced Cirrhosis Portal hypertension with esophageal varices Splenomegaly Elevated INR  Hyperbilirubinemia (worsened from yesterday as T bili went from 4.7 is now 5.7) Abnormal LFTs (AST is mildly off) -s/p TIPS -Needs to follow up with GI as an outpatient  -As above  Thrombocytopenia  -Chronicand stable -likely related to cirrhosis/ splenomegaly -Platelet Count 84,000  Depression/Anxiety -Welbutrin dose has been changed from 150 > 100 mg daily due to poor liver function -Continue with fluoxetine 40 g p.o. daily, and gabapentin 300 mg p.o. nightly -? Wellbutrin causing more tremors  Normocytic Anemia -Since hemoglobin/hematocrit is stable at 12.8/39.5 -In the setting of liver cirrhosis -Continue monitor for signs and symptoms  of bleeding -Repeat CBC in the a.m.  Nonsevere/moderate malnutrition in the context of chronic illness -Nutrition is consulted for further evaluation recommendations -Nutrition is recommending Ensure Enlive  p.o. twice daily as well as encouraging p.o. intakes  DVT prophylaxis: SCDs given thrombocytopenia Code Status: FULL CODE Family Communication: No family present at bedside  Disposition Plan: Likely SNF now as patient is unsafe discharge plan to go home when he is medically stable  Consultants:   Cardiology  Palliative care medicine   Procedures:  Paracentesis   Antimicrobials:  Anti-infectives (From admission, onward)   Start     Dose/Rate Route Frequency Ordered Stop   08/16/18 0115  cefTRIAXone (ROCEPHIN) 2 g in sodium chloride 0.9 % 100 mL IVPB  Status:  Discontinued     2 g 200 mL/hr over 30 Minutes Intravenous Daily at bedtime 08/16/18 0058 08/16/18 0906     Subjective: Seen and examined at bedside and patient was more confused with waxing and waning mental status.  Is still having some shakes and dropping his food.  Appeared very withdrawn.  No nausea or vomiting.  Nursing states that he has not had a bowel movement.  No other concerns or complaints at this time.   Objective: Vitals:   08/19/18 2110 08/20/18 0601 08/20/18 1319 08/20/18 1352  BP: 107/73 117/83 99/66   Pulse: 76 71 77   Resp: 18 16 18    Temp: 98.2 F (36.8 C) 98.5 F (36.9 C) 98.1 F (36.7 C)   TempSrc: Oral Oral Oral   SpO2: 97% 99% 99% 94%  Weight:      Height:        Intake/Output Summary (Last 24 hours) at 08/20/2018 1357 Last data filed at 08/20/2018 0600 Gross per 24 hour  Intake 240 ml  Output 575 ml  Net -335 ml   Filed Weights   08/15/18 2352  Weight: 61.4 kg   Examination: Physical Exam:  Constitutional: Frail cachectic and chronically ill-appearing Caucasian male is currently in no acute distress but is more confused than he was yesterday Eyes: Lids and conjunctive are normal.  Sclera is icteric ENMT: External ears and nose appear normal.  Grossly normal hearing.  Mucous members are moist Neck: Neck appears supple with no appreciable JVD Respiratory: Diminished to  auscultation bilaterally with no appreciable wheezing, rales, rhonchi.  Patient not tachypneic or using any accessory muscles to breathe Cardiovascular: Regular rate and rhythm.  No appreciable murmurs, rubs, gallops. Abdomen: Soft, slightly tender.  Does have a umbilical hernia noted.  Bowel sounds present GU: Deferred Musculoskeletal: No contractures or cyanosis.  No joint warmth noted Skin: Mild jaundice.  No appreciable rashes or lesions limited skin evaluation Neurologic: Cranial nerves II through XII grossly intact.  Romberg sign and cerebellar reflexes not assessed. Psychiatric: Impaired judgment and insight.  Is awake and alert oriented times self.  Is acting somewhat confused  Data Reviewed: I have personally reviewed following labs and imaging studies  CBC: Recent Labs  Lab 08/16/18 0655 08/17/18 0620 08/18/18 1012 08/19/18 0542 08/20/18 0526  WBC 5.6 6.1 5.4 5.8 7.0  NEUTROABS  --   --  3.7 3.7 4.7  HGB 11.7* 11.9* 12.2* 12.2* 12.8*  HCT 34.9* 37.2* 37.2* 38.0* 39.5  MCV 95.4 98.9 98.7 98.2 96.6  PLT 71* 80* 80* 77* 84*   Basic Metabolic Panel: Recent Labs  Lab 08/16/18 0655 08/16/18 1622 08/17/18 0620 08/18/18 1012 08/19/18 0542 08/20/18 0526  NA 141 140 143 141 142 142  K 3.1*  4.5 4.3 3.7 3.8 3.6  CL 106 107 112* 105 106 103  CO2 27 27 26 29 30 30   GLUCOSE 76 87 78 122* 85 87  BUN 7 8 9 9 8 8   CREATININE 0.58* 0.73 0.62 0.65 0.66 0.57*  CALCIUM 7.8* 8.1* 8.5* 8.7* 8.8* 8.9  MG 1.8  --  1.8 1.6* 1.8 1.9  PHOS  --   --   --  3.3 4.0 3.9   GFR: Estimated Creatinine Clearance: 88.5 mL/min (A) (by C-G formula based on SCr of 0.57 mg/dL (L)). Liver Function Tests: Recent Labs  Lab 08/15/18 2029 08/16/18 0655 08/18/18 1012 08/19/18 0542 08/20/18 0526  AST 75* 65* 70* 65* 72*  ALT 39 36 37 35 36  ALKPHOS 154* 126 137* 123 146*  BILITOT 5.9* 4.6* 5.0* 4.7* 5.7*  PROT 6.4* 5.3* 5.8* 5.6* 5.8*  ALBUMIN 2.6* 2.2* 2.4* 2.3* 2.5*   Recent Labs  Lab  08/15/18 2029 08/16/18 0655  LIPASE 60* 63*   Recent Labs  Lab 08/15/18 2039 08/18/18 1012 08/19/18 0831 08/20/18 0830  AMMONIA 79* 102* 87* 90*   Coagulation Profile: No results for input(s): INR, PROTIME in the last 168 hours. Cardiac Enzymes: No results for input(s): CKTOTAL, CKMB, CKMBINDEX, TROPONINI in the last 168 hours. BNP (last 3 results) No results for input(s): PROBNP in the last 8760 hours. HbA1C: No results for input(s): HGBA1C in the last 72 hours. CBG: Recent Labs  Lab 08/17/18 2146  GLUCAP 98   Lipid Profile: No results for input(s): CHOL, HDL, LDLCALC, TRIG, CHOLHDL, LDLDIRECT in the last 72 hours. Thyroid Function Tests: No results for input(s): TSH, T4TOTAL, FREET4, T3FREE, THYROIDAB in the last 72 hours. Anemia Panel: No results for input(s): VITAMINB12, FOLATE, FERRITIN, TIBC, IRON, RETICCTPCT in the last 72 hours. Sepsis Labs: No results for input(s): PROCALCITON, LATICACIDVEN in the last 168 hours.  No results found for this or any previous visit (from the past 240 hour(s)).   Radiology Studies: No results found.  Scheduled Meds: . buPROPion  100 mg Oral Daily  . feeding supplement (ENSURE ENLIVE)  237 mL Oral BID BM  . FLUoxetine  20 mg Oral Daily  . furosemide  40 mg Oral Daily  . gabapentin  300 mg Oral QHS  . lactulose  45 g Oral TID  . lactulose  300 mL Rectal Daily  . midodrine  5 mg Oral TID WC  . multivitamin with minerals  1 tablet Oral Daily  . spironolactone  100 mg Oral Daily   Continuous Infusions: . sodium chloride Stopped (08/18/18 2035)    LOS: 4 days   Merlene Laughter, DO [facility] PAGER is on AMION  If 7PM-7AM, please contact night-coverage [email or web address] Password [email or web address] 08/20/2018, 1:57 PM

## 2018-08-21 DIAGNOSIS — Z7189 Other specified counseling: Secondary | ICD-10-CM

## 2018-08-21 DIAGNOSIS — Z515 Encounter for palliative care: Secondary | ICD-10-CM

## 2018-08-21 LAB — CBC WITH DIFFERENTIAL/PLATELET
ABS IMMATURE GRANULOCYTES: 0.03 10*3/uL (ref 0.00–0.07)
BASOS PCT: 1 %
Basophils Absolute: 0.1 10*3/uL (ref 0.0–0.1)
Eosinophils Absolute: 0.2 10*3/uL (ref 0.0–0.5)
Eosinophils Relative: 4 %
HCT: 38.5 % — ABNORMAL LOW (ref 39.0–52.0)
HEMOGLOBIN: 12.5 g/dL — AB (ref 13.0–17.0)
IMMATURE GRANULOCYTES: 1 %
LYMPHS PCT: 17 %
Lymphs Abs: 1 10*3/uL (ref 0.7–4.0)
MCH: 31.4 pg (ref 26.0–34.0)
MCHC: 32.5 g/dL (ref 30.0–36.0)
MCV: 96.7 fL (ref 80.0–100.0)
MONO ABS: 1.4 10*3/uL — AB (ref 0.1–1.0)
MONOS PCT: 24 %
NEUTROS ABS: 3.2 10*3/uL (ref 1.7–7.7)
NEUTROS PCT: 53 %
PLATELETS: 78 10*3/uL — AB (ref 150–400)
RBC: 3.98 MIL/uL — ABNORMAL LOW (ref 4.22–5.81)
RDW: 16.5 % — ABNORMAL HIGH (ref 11.5–15.5)
WBC: 5.9 10*3/uL (ref 4.0–10.5)
nRBC: 0 % (ref 0.0–0.2)

## 2018-08-21 LAB — COMPREHENSIVE METABOLIC PANEL
ALK PHOS: 140 U/L — AB (ref 38–126)
ALT: 37 U/L (ref 0–44)
AST: 74 U/L — AB (ref 15–41)
Albumin: 2.4 g/dL — ABNORMAL LOW (ref 3.5–5.0)
Anion gap: 8 (ref 5–15)
BUN: 9 mg/dL (ref 6–20)
CALCIUM: 8.9 mg/dL (ref 8.9–10.3)
CHLORIDE: 106 mmol/L (ref 98–111)
CO2: 29 mmol/L (ref 22–32)
CREATININE: 0.61 mg/dL (ref 0.61–1.24)
GFR calc non Af Amer: >60 mL/min (ref >60)
Glucose, Bld: 79 mg/dL (ref 70–99)
Potassium: 4.1 mmol/L (ref 3.5–5.1)
Sodium: 143 mmol/L (ref 135–145)
Total Bilirubin: 5.7 mg/dL — ABNORMAL HIGH (ref 0.3–1.2)
Total Protein: 5.7 g/dL — ABNORMAL LOW (ref 6.5–8.1)

## 2018-08-21 LAB — MAGNESIUM: Magnesium: 1.7 mg/dL (ref 1.7–2.4)

## 2018-08-21 LAB — AMMONIA: Ammonia: 87 umol/L — ABNORMAL HIGH (ref 9–35)

## 2018-08-21 LAB — PHOSPHORUS: Phosphorus: 3.7 mg/dL (ref 2.5–4.6)

## 2018-08-21 MED ORDER — PROMETHAZINE HCL 25 MG/ML IJ SOLN: 6.2500 mg | Freq: Four times a day (QID) | INTRAMUSCULAR | Status: DC | PRN

## 2018-08-21 MED ORDER — MAGNESIUM SULFATE 2 GM/50ML IV SOLN
2.0000 g | Freq: Once | INTRAVENOUS | Status: AC
  Administered 2018-08-21: 2 g via INTRAVENOUS
  Filled 2018-08-21 (×2): qty 50

## 2018-08-21 NOTE — Progress Notes (Signed)
Palliative care progress note  Reason for consult: Goals of care in light of ESLD  I checked in on Kenneth [name] today.  He is more confused.  When asked if I should call family, he reported yes and told me to call his son.  I was able to reach his son today.  I introduced palliative care as specialized medical care for people living with serious illness. It focuses on providing relief from the symptoms and stress of a serious illness. The goal is to improve quality of life for both the patient and the family.  We discussed his father's clinical course including continued decline in functional status and cognition.  His son reports that he understand that his father is very ill and is at high risk of continued decompensation.  Discussed how to develop plan of care to focus on continuing therapies that would maximize chance of being well enough to return home and limiting therapies not in line with this goal.  We also discussed that he currently has HCPOA paperwork on chart that names pt sister as HCPOA.  His son reports that this was likely from when his father was attempting to get care in [place] (where his sister lives).  His son feels that pt sister would be a good HCPOA, but his is not sure if she still want this role. He will discuss with her.  I left a copy of Hard Choices for Loving People in pt room for his son to review and discuss with family.  Palliative to continue to follow.  Will plan to see again on Monday.  Total time: 55 minutes Greater than 50%  of this time was spent counseling and coordinating care related to the above assessment and plan.  Kenneth Minus, MD [facility] Palliative Medicine Team [phone]

## 2018-08-21 NOTE — Progress Notes (Signed)
PROGRESS NOTE    Kenneth [name]  ZOX:[id number] DOB: [birth year] DOA: 08/15/2018 PCP: [name], MD   Brief Narrative:  [name] a 57 y.o.malewithhistory of hepatic cirrhosis status post TIPS procedure with history of esophageal varices who has had recent paracentesis done 2 weeks ago was brought to the ER after patient's family found patient was increasingly confused. Patient also states that over thepast 4 days patient felt that his abdomen is getting more distendedand he has has episodes of nausea and vomiting.   In ED >Ammonia 79, K 3.1, oriented to self and place, abdomen distended- given IVF and K.   S/p IR guided paracentesis on 10/16, ~ 4 L removed.  PT evaluated and recommending SNF and social work involved for assistance with placement.   Patient today remained somewhat intermittently confused and per nurse now has had multiple bowel movements.Started lactulose retention enemas as well as increasing p.o. lactulose yesterday but will hold off on Enema today   Assessment & Plan:   Principal Problem:   Hepatic encephalopathy (HCC) Active Problems:   Portal hypertension with esophageal varices (HCC)   Thrombocytopenia (HCC)   Alcoholic cirrhosis of liver with ascites (HCC)   Depression   Hypokalemia   Hypomagnesemia   Malnutrition of moderate degree   Accelerated junctional rhythm  Hepatic Encephalopathy  Hyperammonemia  -Cont Lactulose- Remains intermittently confused  -Ammonia Level went from 79 -> 102 -> 87 -> 90 -> 87 -Increased Lactulose to 30 grams TID and patient was improving but now is more confused so we will increased again to 45 g p.o. 3 times daily yesterday and will continue today  -Started the patient on lactulose enemas and have ordered 300 mL rectal yesterday  -Continue to Monitor and repeat Ammonia Level in AM  -Start Delirium Precautions   Hypokalemia, improved Hypomagnesemia, improved -Was due t vomiting and  diuretics -Replacingvia IV and orally -K+ is4.1 today -Mg+ was1.7 this AM but will give IV Mag Sulfate 2 grams -Try to achieve K+ > 4.0 and Mg+ > 2.0 -Continue to monitor and replete as necessary -Repeat CMP in the a.m. along with mag level  Accelerated Junctional rhythm -Corrected electrolyte abnormalities- HR is still junctional in 60s -Cave asked for Cardiology eval- Cardiology does not feel any further work up is needed as he is asymptomatic and Dr. Butler Denmark discussed with Dr Delton See  - BP stable in 90- 100s- cont Midodrine for this  Nausea and Vomiting -Continue with Antiemetics with po/IV Zofran -Patient has Phenergan 6.25 mg IV q6hPRN for Breakthrough Nausea and Vomiting -May need to call GI if not improving   Chronic Hypotension -Continue Midodrine 5 mg p.o. 3 times daily with meals  Alcoholic cirrhosis of liver with ascites  -S/p paracentesis as mentioned above in HPI -Abdomen is non-tender and not severely distended -No need for another paracentesis- stop Ceftriaxone -Resumed oral diuretics with Spironolactone 100 mg po Daily -C/w 1200 fluid restriction added -Resumed Home Lasix at 2/3 of the Home dose and started 40 mg po Daily and will continue for now and may increased to Home dose tomorrow  -No longer drinks alcohol -Palliative care consulted for goals of care discussion appreciate palliative evaluation  Advanced Cirrhosis Portal hypertension with esophageal varices Splenomegaly Elevated INR  Hyperbilirubinemia Abnormal LFTs (AST is mildly off) -s/p TIPS -Needs to follow up with GI as an outpatient but may have GI Evaluation inpatient if continues to have Nausea/Vomiting -As above  Thrombocytopenia  -Chronicand stable -likely related to cirrhosis/ splenomegaly -  Platelet Count 78,000  Depression/Anxiety -Welbutrin dose has been changed from 150 > 100 mg daily due to poor liver function -Changed with Fluoxetine 40 mg p.o. Daily to 20 mg po Daily,  and Gabapentin 300 mg p.o. nightly -? Wellbutrin causing more tremors however they have been improving   Normocytic Anemia -Since hemoglobin/hematocrit is stable at 12.5/38.5 -In the setting of liver cirrhosis -Continue monitor for signs and symptoms of bleeding -Repeat CBC in the a.m.  Non-severe/Moderate malnutrition in the context of chronic illness -Nutrition is consulted for further evaluation recommendations -Nutrition is recommending Ensure Enlive p.o. twice daily as well as encouraging p.o. intakes  DVT prophylaxis: SCDs given thrombocytopenia Code Status: FULL CODE Family Communication: No family present at bedside  Disposition Plan: Likely SNF now as patient is unsafe discharge plan to go home when he is medically stable  Consultants:   Cardiology  Palliative care medicine   Procedures:  Paracentesis   Antimicrobials:  Anti-infectives (From admission, onward)   Start     Dose/Rate Route Frequency Ordered Stop   08/16/18 0115  cefTRIAXone (ROCEPHIN) 2 g in sodium chloride 0.9 % 100 mL IVPB  Status:  Discontinued     2 g 200 mL/hr over 30 Minutes Intravenous Daily at bedtime 08/16/18 0058 08/16/18 0906     Subjective: Seen and examined at bedside and still somewhat confused and forgetful.  Not in any acute distress and but could not remember that he was walking with therapy.  He denied having a bowel movement however nurse states that he had audible bowel movements yesterday.  No Family present at bedside.  Objective: Vitals:   08/20/18 1352 08/20/18 2139 08/21/18 0559 08/21/18 1245  BP:  117/65 108/72 109/74  Pulse:  78 78 76  Resp:  16 18 12   Temp:  98.2 F (36.8 C) 98 F (36.7 C) 97.8 F (36.6 C)  TempSrc:  Oral Oral Oral  SpO2: 94% 98% 100% 95%  Weight:      Height:        Intake/Output Summary (Last 24 hours) at 08/21/2018 1525 Last data filed at 08/21/2018 1055 Gross per 24 hour  Intake 240 ml  Output 800 ml  Net -560 ml   Filed Weights    08/15/18 2352  Weight: 61.4 kg   Examination: Physical Exam:  Constitutional: Frail, cachectic, chronically ill-appearing Caucasian male currently no acute distress but is still somewhat confused and more forgetful today Eyes: Lids and conjunctive are normal.  Sclera is icteric ENMT: External ears and nose appear normal.  Grossly normal hearing.  Extremities are moist Neck: Appears supple with no JVD Respiratory: Slightly diminished to auscultation bilaterally no appreciable wheezing, rales, rhonchi.  Patient was not tachypneic using accessory muscles to breathe Cardiovascular: Regular rate and rhythm.  No appreciable murmurs, rubs, gallops Abdomen: Soft, slightly tender.  Does have an umbilical hernia noted.  Bowel sounds present in 4 quadrants GU: Deferred but is wearing a condom catheter Musculoskeletal: No contractures or cyanosis.  No joint deformities noted Skin: Skin is warm and dry.  Does have some jaundice. no appreciable rashes or lesions on limited skin evaluation Neurologic: Cranial nerves II through XII grossly intact no appreciable focal deficits. Psychiatric: Impaired judgment insight.  Patient is awake alert and oriented to himself.  Still acting somewhat confused and more forgetful today  Data Reviewed: I have personally reviewed following labs and imaging studies  CBC: Recent Labs  Lab 08/17/18 0620 08/18/18 1012 08/19/18 0542 08/20/18 0526 08/21/18 4540  WBC 6.1 5.4 5.8 7.0 5.9  NEUTROABS  --  3.7 3.7 4.7 3.2  HGB 11.9* 12.2* 12.2* 12.8* 12.5*  HCT 37.2* 37.2* 38.0* 39.5 38.5*  MCV 98.9 98.7 98.2 96.6 96.7  PLT 80* 80* 77* 84* 78*   Basic Metabolic Panel: Recent Labs  Lab 08/17/18 0620 08/18/18 1012 08/19/18 0542 08/20/18 0526 08/21/18 0506  NA 143 141 142 142 143  K 4.3 3.7 3.8 3.6 4.1  CL 112* 105 106 103 106  CO2 26 29 30 30 29   GLUCOSE 78 122* 85 87 79  BUN 9 9 8 8 9   CREATININE 0.62 0.65 0.66 0.57* 0.61  CALCIUM 8.5* 8.7* 8.8* 8.9 8.9  MG  1.8 1.6* 1.8 1.9 1.7  PHOS  --  3.3 4.0 3.9 3.7   GFR: Estimated Creatinine Clearance: 88.5 mL/min (by C-G formula based on SCr of 0.61 mg/dL). Liver Function Tests: Recent Labs  Lab 08/16/18 0655 08/18/18 1012 08/19/18 0542 08/20/18 0526 08/21/18 0506  AST 65* 70* 65* 72* 74*  ALT 36 37 35 36 37  ALKPHOS 126 137* 123 146* 140*  BILITOT 4.6* 5.0* 4.7* 5.7* 5.7*  PROT 5.3* 5.8* 5.6* 5.8* 5.7*  ALBUMIN 2.2* 2.4* 2.3* 2.5* 2.4*   Recent Labs  Lab 08/15/18 2029 08/16/18 0655  LIPASE 60* 63*   Recent Labs  Lab 08/15/18 2039 08/18/18 1012 08/19/18 0831 08/20/18 0830 08/21/18 0928  AMMONIA 79* 102* 87* 90* 87*   Coagulation Profile: No results for input(s): INR, PROTIME in the last 168 hours. Cardiac Enzymes: No results for input(s): CKTOTAL, CKMB, CKMBINDEX, TROPONINI in the last 168 hours. BNP (last 3 results) No results for input(s): PROBNP in the last 8760 hours. HbA1C: No results for input(s): HGBA1C in the last 72 hours. CBG: Recent Labs  Lab 08/17/18 2146  GLUCAP 98   Lipid Profile: No results for input(s): CHOL, HDL, LDLCALC, TRIG, CHOLHDL, LDLDIRECT in the last 72 hours. Thyroid Function Tests: No results for input(s): TSH, T4TOTAL, FREET4, T3FREE, THYROIDAB in the last 72 hours. Anemia Panel: No results for input(s): VITAMINB12, FOLATE, FERRITIN, TIBC, IRON, RETICCTPCT in the last 72 hours. Sepsis Labs: No results for input(s): PROCALCITON, LATICACIDVEN in the last 168 hours.  No results found for this or any previous visit (from the past 240 hour(s)).   Radiology Studies: No results found.  Scheduled Meds: . buPROPion  100 mg Oral Daily  . feeding supplement (ENSURE ENLIVE)  237 mL Oral BID BM  . FLUoxetine  20 mg Oral Daily  . furosemide  40 mg Oral Daily  . gabapentin  300 mg Oral QHS  . lactulose  45 g Oral TID  . lactulose  300 mL Rectal Daily  . midodrine  5 mg Oral TID WC  . multivitamin with minerals  1 tablet Oral Daily  .  spironolactone  100 mg Oral Daily   Continuous Infusions: . sodium chloride Stopped (08/18/18 2035)    LOS: 5 days   Merlene Laughter, DO [facility] PAGER is on AMION  If 7PM-7AM, please contact night-coverage [email or web address] Password [email or web address] 08/21/2018, 3:25 PM

## 2018-08-21 NOTE — Consult Note (Signed)
Consultation Note Date: 08/23/2018   Patient Name: Kenneth [name]  DOB: [birth year]  MRN: [id number]  Age / Sex: 57 y.o., male  PCP: [name], MD Referring Physician: Merlene Laughter, DO  Reason for Consultation: Establishing goals of care  HPI/Patient Profile: 57 y.o. male  with past medical history of hepatic cirrhosis s/p TIPS (still with recurrent paracentesis- last a couple weeks ago) and hx of esophageal varices admitted on 08/15/2018 with hepatic encephalopathy.  Currently working toward placement as unsafe home environment.  Palliative consulted for goals of care.   Clinical Assessment and Goals of Care: Chart reviewed.  Discussed with primary attending and bedside RN.  I saw and examined [name] today.  He is sleepy but arouses easily and is able to participate in some conversation.  He is able to state that he has "a bad liver" does not have insight into what is going on with his current hospitalization.  Attempted to discuss things that are most important to him, and he reports his family.  States that his son, [name] has been coming to see him.    Also attempted to discuss HCPOA (paperwork in chart reveals his sister, [name], is HCPOA).  He reports that he may want his son to serve in this capacity.  And asked me to call his son today.    SUMMARY OF RECOMMENDATIONS   - GOC: Patient does not have capacity at this time. Will reach out to family (son and sister) to set up GOC meeting . Code Status/Advance Care Planning:  Full code   Symptom Management:   HE: Per primary/GI  Palliative Prophylaxis:   Delirium Protocol  Prognosis:   Guarded  Discharge Planning: SNF once bed available      Primary Diagnoses: Present on Admission: . Hepatic encephalopathy (HCC) . Portal hypertension with esophageal varices (HCC) . Alcoholic cirrhosis of liver with ascites (HCC) .  Thrombocytopenia (HCC) . Hypokalemia . Hypomagnesemia . Depression   I have reviewed the medical record, interviewed the patient and family, and examined the patient. The following aspects are pertinent.  Past Medical History:  Diagnosis Date  . Alcohol abuse   . Anxiety   . Arthritis    "never diagnosis"  . Blood clot in vein   . Cirrhosis (HCC)   . Depression   . Dyspnea    Due to ascites  . Esophageal varices (HCC) 09/02/2014  . H/O [facility] spotted fever    age 27 or 49  . Hernia, inguinal    right  . Hiatal hernia 09/02/2014  . Polysubstance abuse (HCC)   . Portal hypertension with esophageal varices (HCC) 09/02/2014  . Portal hypertensive gastropathy (HCC) 09/02/2014  . Thrombocytopenia (HCC) 09/02/2014  . Upper GI bleed 08/31/2014   Social History   Socioeconomic History  . Marital status: Single    Spouse name: Not on file  . Number of children: 1  . Years of education: Not on file  . Highest education level: Not on file  Occupational History  .  Occupation: Administrator  Social Needs  . Financial resource strain: Not on file  . Food insecurity:    Worry: Not on file    Inability: Not on file  . Transportation needs:    Medical: Not on file    Non-medical: Not on file  Tobacco Use  . Smoking status: Current Every Day Smoker    Packs/day: 0.50    Years: 10.00    Pack years: 5.00    Types: Cigarettes  . Smokeless tobacco: Never Used  Substance and Sexual Activity  . Alcohol use: No    Comment: no alcohol since 01/23/16  . Drug use: Yes    Frequency: 5.0 times per week    Types: Marijuana  . Sexual activity: Not on file  Lifestyle  . Physical activity:    Days per week: Not on file    Minutes per session: Not on file  . Stress: Not on file  Relationships  . Social connections:    Talks on phone: Not on file    Gets together: Not on file    Attends religious service: Not on file    Active member of club or organization: Not on file    Attends  meetings of clubs or organizations: Not on file    Relationship status: Not on file  Other Topics Concern  . Not on file  Social History Narrative  . Not on file   Family History  Problem Relation Age of Onset  . Leukemia Father        acute myloid   . Hyperlipidemia Father   . Hypertension Father   . Colon cancer Neg Hx   . Esophageal cancer Neg Hx   . Pancreatic cancer Neg Hx   . Stomach cancer Neg Hx   . Liver disease Neg Hx    Scheduled Meds: . buPROPion  100 mg Oral Daily  . feeding supplement (ENSURE ENLIVE)  237 mL Oral BID BM  . FLUoxetine  20 mg Oral Daily  . furosemide  40 mg Oral Daily  . gabapentin  300 mg Oral QHS  . lactulose  45 g Oral TID  . midodrine  5 mg Oral TID WC  . multivitamin with minerals  1 tablet Oral Daily  . nicotine  21 mg Transdermal Daily  . phosphorus  500 mg Oral BID  . rifaximin  550 mg Oral BID  . spironolactone  100 mg Oral Daily   Continuous Infusions: . sodium chloride Stopped (08/23/18 1018)   PRN Meds:.sodium chloride, lidocaine, ondansetron **OR** ondansetron (ZOFRAN) IV, promethazine Medications Prior to Admission:  Prior to Admission medications   Medication Sig Start Date End Date Taking? Authorizing Provider  aMILoride (MIDAMOR) 5 MG tablet Take 2 tablets (10 mg total) by mouth daily. 08/11/18  Yes Danis, Andreas Blower, MD  buPROPion (WELLBUTRIN SR) 150 MG 12 hr tablet Take 1 tablet (150 mg total) by mouth 2 (two) times daily. 08/02/18  Yes [name], MD  ergocalciferol (DRISDOL) 50000 units capsule Take 1 capsule (50,000 Units total) by mouth once a week. 08/03/18  Yes [name], MD  FLUoxetine (PROZAC) 40 MG capsule Take 1 capsule (40 mg total) by mouth daily. 08/02/18  Yes [name], MD  furosemide (LASIX) 40 MG tablet Take 1.5 tablets (60 mg total) by mouth daily. 07/21/18  Yes Esterwood, Amy S, PA-C  gabapentin (NEURONTIN) 300 MG capsule Take 1 capsule (300 mg total) by mouth at bedtime. 08/02/18  Yes [name], MD  lactulose (CHRONULAC) 10 GM/15ML solution Increase to 4 tablespoons by mouth twice daily. 07/21/18  Yes Esterwood, Amy S, PA-C  midodrine (PROAMATINE) 5 MG tablet Take 1 tablet (5 mg total) by mouth 3 (three) times daily with meals. 07/21/18  Yes Esterwood, Amy S, PA-C  ondansetron (ZOFRAN) 4 MG tablet Take 1 tablet (4 mg total) by mouth every 8 (eight) hours as needed for nausea or vomiting. 08/11/18  Yes Danis, Andreas Blower, MD  buPROPion (WELLBUTRIN SR) 100 MG 12 hr tablet Take 1 tablet (100 mg total) by mouth daily. 08/18/18   Calvert Cantor, MD  diclofenac sodium (VOLTAREN) 1 % GEL Apply 4 g topically 4 (four) times daily. Patient not taking: Reported on 08/15/2018 12/15/17   [name], MD  feeding supplement, ENSURE ENLIVE, (ENSURE ENLIVE) LIQD Take 237 mLs by mouth 2 (two) times daily between meals. 08/18/18   Calvert Cantor, MD  Multiple Vitamin (MULTIVITAMIN WITH MINERALS) TABS tablet Take 1 tablet by mouth daily. 08/18/18   Calvert Cantor, MD  Potassium Chloride ER 20 MEQ TBCR Take 40 mEq by mouth daily. 08/17/18   Calvert Cantor, MD   Allergies  Allergen Reactions  . Latex Swelling and Rash   Review of Systems  Pt confused  Physical Exam General: Alert, awake, in no acute distress but poor insight.  Heart: Regular rate and rhythm. No murmur appreciated. Lungs: Good air movement, clear Abdomen: Soft, nontender, mild distended, positive bowel sounds.  Skin: Warm and dry  Vital Signs: BP 103/74 (BP Location: Left Arm)   Pulse 75   Temp 97.8 F (36.6 C) (Oral)   Resp 12   Ht 5\' 7"  (1.702 m)   Wt 61.4 kg   SpO2 99%   BMI 21.20 kg/m  Pain Scale: 0-10   Pain Score: 0-No pain   SpO2: SpO2: 99 % O2 Device:SpO2: 99 % O2 Flow Rate: .   IO: Intake/output summary:   Intake/Output Summary (Last 24 hours) at 08/23/2018 1544 Last data filed at 08/23/2018 1500 Gross per 24 hour  Intake 2204.72 ml  Output 901 ml  Net 1303.72 ml    LBM: Last BM Date:  08/22/18 Baseline Weight: Weight: 61.4 kg Most recent weight: Weight: 61.4 kg     Palliative Assessment/Data:    Time Total: 55 minutes  Greater than 50%  of this time was spent counseling and coordinating care related to the above assessment and plan.  Signed by: Romie Minus, MD   Please contact Palliative Medicine Team phone at [phone] for questions and concerns.  For individual provider: See Loretha Stapler

## 2018-08-22 DIAGNOSIS — R740 Nonspecific elevation of levels of transaminase and lactic acid dehydrogenase [LDH]: Secondary | ICD-10-CM

## 2018-08-22 DIAGNOSIS — K7201 Acute and subacute hepatic failure with coma: Secondary | ICD-10-CM

## 2018-08-22 DIAGNOSIS — K7200 Acute and subacute hepatic failure without coma: Secondary | ICD-10-CM

## 2018-08-22 LAB — CBC WITH DIFFERENTIAL/PLATELET
ABS IMMATURE GRANULOCYTES: 0.03 10*3/uL (ref 0.00–0.07)
BASOS PCT: 1 %
Basophils Absolute: 0.1 10*3/uL (ref 0.0–0.1)
EOS PCT: 3 %
Eosinophils Absolute: 0.2 10*3/uL (ref 0.0–0.5)
HCT: 37.5 % — ABNORMAL LOW (ref 39.0–52.0)
Hemoglobin: 12.4 g/dL — ABNORMAL LOW (ref 13.0–17.0)
Immature Granulocytes: 1 %
Lymphocytes Relative: 14 %
Lymphs Abs: 0.8 10*3/uL (ref 0.7–4.0)
MCH: 31.6 pg (ref 26.0–34.0)
MCHC: 33.1 g/dL (ref 30.0–36.0)
MCV: 95.4 fL (ref 80.0–100.0)
MONO ABS: 1.3 10*3/uL — AB (ref 0.1–1.0)
MONOS PCT: 23 %
Neutro Abs: 3.4 10*3/uL (ref 1.7–7.7)
Neutrophils Relative %: 58 %
PLATELETS: 74 10*3/uL — AB (ref 150–400)
RBC: 3.93 MIL/uL — AB (ref 4.22–5.81)
RDW: 16.7 % — ABNORMAL HIGH (ref 11.5–15.5)
WBC: 5.8 10*3/uL (ref 4.0–10.5)
nRBC: 0 % (ref 0.0–0.2)

## 2018-08-22 LAB — COMPREHENSIVE METABOLIC PANEL
ALBUMIN: 2.3 g/dL — AB (ref 3.5–5.0)
ALK PHOS: 140 U/L — AB (ref 38–126)
ALT: 40 U/L (ref 0–44)
AST: 74 U/L — AB (ref 15–41)
Anion gap: 6 (ref 5–15)
BUN: 8 mg/dL (ref 6–20)
CALCIUM: 8.3 mg/dL — AB (ref 8.9–10.3)
CHLORIDE: 102 mmol/L (ref 98–111)
CO2: 32 mmol/L (ref 22–32)
CREATININE: 0.64 mg/dL (ref 0.61–1.24)
GFR calc Af Amer: >60 mL/min (ref >60)
GFR calc non Af Amer: >60 mL/min (ref >60)
GLUCOSE: 99 mg/dL (ref 70–99)
Potassium: 3.1 mmol/L — ABNORMAL LOW (ref 3.5–5.1)
SODIUM: 140 mmol/L (ref 135–145)
Total Bilirubin: 5.1 mg/dL — ABNORMAL HIGH (ref 0.3–1.2)
Total Protein: 5.7 g/dL — ABNORMAL LOW (ref 6.5–8.1)

## 2018-08-22 LAB — URINALYSIS, ROUTINE W REFLEX MICROSCOPIC
GLUCOSE, UA: NEGATIVE mg/dL
Hgb urine dipstick: NEGATIVE
KETONES UR: 5 mg/dL — AB
LEUKOCYTES UA: NEGATIVE
Nitrite: NEGATIVE
PH: 8 (ref 5.0–8.0)
Protein, ur: NEGATIVE mg/dL
Specific Gravity, Urine: 1.013 (ref 1.005–1.030)

## 2018-08-22 LAB — MAGNESIUM: Magnesium: 1.8 mg/dL (ref 1.7–2.4)

## 2018-08-22 LAB — PHOSPHORUS: PHOSPHORUS: 2.7 mg/dL (ref 2.5–4.6)

## 2018-08-22 LAB — AMMONIA: Ammonia: 147 umol/L — ABNORMAL HIGH (ref 9–35)

## 2018-08-22 MED ORDER — MAGNESIUM SULFATE 2 GM/50ML IV SOLN
2.0000 g | Freq: Once | INTRAVENOUS | Status: AC
  Administered 2018-08-22: 2 g via INTRAVENOUS
  Filled 2018-08-22: qty 50

## 2018-08-22 MED ORDER — POTASSIUM CHLORIDE 10 MEQ/100ML IV SOLN
10.0000 meq | INTRAVENOUS | Status: AC
  Administered 2018-08-22 (×4): 10 meq via INTRAVENOUS
  Filled 2018-08-22 (×4): qty 100

## 2018-08-22 MED ORDER — RIFAXIMIN 550 MG PO TABS
550.0000 mg | ORAL_TABLET | Freq: Two times a day (BID) | ORAL | Status: DC
  Administered 2018-08-22 – 2018-08-26 (×9): 550 mg via ORAL
  Filled 2018-08-22 (×9): qty 1

## 2018-08-22 MED ORDER — POTASSIUM CHLORIDE CRYS ER 20 MEQ PO TBCR
40.0000 meq | EXTENDED_RELEASE_TABLET | Freq: Two times a day (BID) | ORAL | Status: DC
  Administered 2018-08-22 (×2): 40 meq via ORAL
  Filled 2018-08-22 (×2): qty 2

## 2018-08-22 MED ORDER — NICOTINE 21 MG/24HR TD PT24
21.0000 mg | MEDICATED_PATCH | Freq: Every day | TRANSDERMAL | Status: DC
  Administered 2018-08-22 – 2018-08-26 (×5): 21 mg via TRANSDERMAL
  Filled 2018-08-22 (×5): qty 1

## 2018-08-22 MED ORDER — LACTULOSE ENEMA
300.0000 mL | Freq: Two times a day (BID) | ORAL | Status: DC
  Administered 2018-08-22: 300 mL via RECTAL
  Filled 2018-08-22 (×3): qty 300

## 2018-08-22 NOTE — Progress Notes (Signed)
PROGRESS NOTE    Kenneth [name]  ZOX:[id number] DOB: 1961/05/03 DOA: 08/15/2018 PCP: [name], MD   Brief Narrative:  Kenneth [name] a 57 y.o.malewithhistory of hepatic cirrhosis status post TIPS procedure with history of esophageal varices who has had recent paracentesis done 2 weeks ago was brought to the ER after patient's family found patient was increasingly confused. Patient also states that over thepast 4 days patient felt that his abdomen is getting more distendedand he has has episodes of nausea and vomiting.   In ED >Ammonia 79, K 3.1, oriented to self and place, abdomen distended- given IVF and K.   S/p IR guided paracentesis on 10/16, ~ 4 L removed.  PT evaluated and recommending SNF and social work involved for assistance with placement.   Patient continues to remain severely confused so I have increased his lactulose and lactulose enemas.  I have consulted gastroenterology also for further evaluation recommendations.  I do not feel that this is infectious etiology and this is likely from  Assessment & Plan:   Principal Problem:   Hepatic encephalopathy (HCC) Active Problems:   Portal hypertension with esophageal varices (HCC)   Thrombocytopenia (HCC)   Alcoholic cirrhosis of liver with ascites (HCC)   Depression   Hypokalemia   Hypomagnesemia   Malnutrition of moderate degree   Accelerated junctional rhythm  Hepatic Encephalopathy  Hyperammonemia  -Cont Lactulose- Remains intermittently confused  -Ammonia Level went from 79 -> 102 -> 87 -> 90 -> 87 -> 147 -Increased Lactulose 45 g p.o. 3 times daily yesterday and will continue today  -Started the patient on lactulose enemas and have ordered 300 mL rectal yesterday and increased to BID  -Gastroenterology recommending starting Xifaxan 550 mill grams p.o. twice daily -Per GI if encephalopathy does not improve considering diagnostic paracentesis to rule out SBP but this is unlikely -GI recommending  continuing current dose of diuretics and monitoring and repleting electrolytes -I doubt this is infectious as UA was unremarkable however I have ordered a urine culture and blood cultures the patient is afebrile and has no white count -Per GI patient did not show up for his liver transplant appointment however they are skeptical about his candidacy for liver transplant at this point now -I have consulted gastroenterology for further evaluation and assistance. -Continue to Monitor and repeat Ammonia Level in AM  -Start Delirium Precautions  -Appreciate GI evaluation further assistance  Hypokalemia, improved Hypomagnesemia, improved -Was due t vomiting and diuretics -Replacingvia IV and orally -K+ is3.1 today -Mg+ was1.8 this AM but will give IV Mag Sulfate 2 grams -Try to achieve K+ > 4.0 and Mg+ > 2.0 -Continue to monitor and replete as necessary -Repeat CMP in the a.m. along with mag level  Accelerated Junctional rhythm -Corrected electrolyte abnormalities- HR is still junctional in 60s -Cave asked for Cardiology eval- Cardiology does not feel any further work up is needed as he is asymptomatic and Dr. Butler Denmark discussed with Dr Delton See  - BP stable in 90- 100s- cont Midodrine for this  Nausea and Vomiting -Continue with Antiemetics with po/IV Zofran -Patient has Phenergan 6.25 mg IV q6hPRN for Breakthrough Nausea and Vomiting -GI Consulted for Hepatic Encephalopathy and following -Appreciate any further reccs for N/V  Chronic Hypotension -Continue Midodrine 5 mg p.o. 3 times daily with meals  Alcoholic cirrhosis of liver with ascites  -S/p paracentesis as mentioned above in HPI -Abdomen is non-tender and not severely distended -No need for another paracentesis- stop Ceftriaxone -Resumed oral diuretics with  Spironolactone 100 mg po Daily -C/w 1200 fluid restriction added -Resumed Home Lasix at 2/3 of the Home dose and started 40 mg po Daily and will continue for now and  may increased to Home dose tomorrow  -No longer drinks alcohol -Palliative care consulted for goals of care discussion appreciate palliative evaluation -GI Consulted for further evaluation and recommendations -Gastroenterology started the patient on Xifaxan 550 mg p.o. twice daily  Advanced Cirrhosis Portal hypertension with esophageal varices Splenomegaly Elevated INR  Hyperbilirubinemia Abnormal LFTs (AST is mildly off and elevated to 74) -s/p TIPS -As above -GI now following; patient was supposed to follow-up with the liver transplant specialist and had a liver transplant appointment however did not show for it and is now awaiting other referral to [facility] but continues to decline.  Thrombocytopenia  -Chronicand stable -likely related to cirrhosis splenomegaly -Platelet Count 74,000  Depression/Anxiety -Welbutrin dose has been changed from 150 > 100 mg daily due to poor liver function -Changed with Fluoxetine 40 mg p.o. Daily to 20 mg po Daily, and Gabapentin 300 mg p.o. nightly -? Wellbutrin causing more tremors however they have been improving   Normocytic Anemia -Since hemoglobin/hematocrit is stable at 12.4/37.5 -In the setting of liver cirrhosis -Continue monitor for signs and symptoms of bleeding -Repeat CBC in the a.m.  Non-severe/Moderate malnutrition in the context of chronic illness -Nutrition is consulted for further evaluation recommendations -Nutrition is recommending Ensure Enlive p.o. twice daily as well as encouraging p.o. intakes  DVT prophylaxis: SCDs given thrombocytopenia Code Status: FULL CODE Family Communication: No family present at bedside  Disposition Plan: Likely SNF now as patient is unsafe discharge plan to go home when he is medically stable  Consultants:   Cardiology  Palliative care medicine  Gastroenterology   Procedures:  Paracentesis   Antimicrobials:  Anti-infectives (From admission, onward)   Start      Dose/Rate Route Frequency Ordered Stop   08/22/18 1500  rifaximin (XIFAXAN) tablet 550 mg     550 mg Oral 2 times daily 08/22/18 1421     08/16/18 0115  cefTRIAXone (ROCEPHIN) 2 g in sodium chloride 0.9 % 100 mL IVPB  Status:  Discontinued     2 g 200 mL/hr over 30 Minutes Intravenous Daily at bedtime 08/16/18 0058 08/16/18 0906     Subjective: Seen and examined at bedside was very confused today.  Was being helped from his bed to the bedside commode by nursing this morning.  States his abdomen was hurting slightly.  Ammonia level extremely elevated.  Nursing states that he had bowel movements all of yesterday but none today.  No chest pain, lightheadedness or dizziness  Objective: Vitals:   08/21/18 1245 08/21/18 2103 08/22/18 0538 08/22/18 1319  BP: 109/74 99/71 107/74 107/77  Pulse: 76 76 81 75  Resp: 12 18 18 12   Temp: 97.8 F (36.6 C) 98.4 F (36.9 C) 98.1 F (36.7 C) 98.2 F (36.8 C)  TempSrc: Oral Oral Oral Oral  SpO2: 95% 98% 98% 97%  Weight:      Height:        Intake/Output Summary (Last 24 hours) at 08/22/2018 1439 Last data filed at 08/22/2018 0654 Gross per 24 hour  Intake 510 ml  Output 600 ml  Net -90 ml   Filed Weights   08/15/18 2352  Weight: 61.4 kg   Examination: Physical Exam:  Constitutional: Frail, cachectic, chronically ill-appearing Caucasian male currently no acute distress but is severely confused again Eyes: Lids and conjunctive  are normal.  Sclera is icteric ENMT: External ears and nose appear normal.  Grossly normal hearing.  Mucous membranes are moist Neck: Appears supple no JVD Respiratory: Diminished to auscultation bilaterally with no appreciable wheezing, rales, rhonchi.  Patient not tachypneic using accessory muscle breathe and had unlabored breathing Cardiovascular: Regular rate and rhythm.  No appreciable murmurs, rubs, gallops Abdomen: Soft, slightly tender.  Has an umbilical hernia noted.  Bowel sounds present all 4 quadrants and  slightly hyperactive GU: Deferred but is wearing a condom catheter Musculoskeletal: No contractures or cyanosis Skin: Is warm and dry.  Does have some jaundice.  No appreciable rashes lesions limited skin evaluation Neurologic: Cranial nerves II through XII grossly intact no appreciable focal deficits Psychiatric: Impaired judgment and insight.  Patient is awake but not as alert and definitely not oriented  Data Reviewed: I have personally reviewed following labs and imaging studies  CBC: Recent Labs  Lab 08/18/18 1012 08/19/18 0542 08/20/18 0526 08/21/18 0506 08/22/18 0537  WBC 5.4 5.8 7.0 5.9 5.8  NEUTROABS 3.7 3.7 4.7 3.2 3.4  HGB 12.2* 12.2* 12.8* 12.5* 12.4*  HCT 37.2* 38.0* 39.5 38.5* 37.5*  MCV 98.7 98.2 96.6 96.7 95.4  PLT 80* 77* 84* 78* 74*   Basic Metabolic Panel: Recent Labs  Lab 08/18/18 1012 08/19/18 0542 08/20/18 0526 08/21/18 0506 08/22/18 0537  NA 141 142 142 143 140  K 3.7 3.8 3.6 4.1 3.1*  CL 105 106 103 106 102  CO2 29 30 30 29  32  GLUCOSE 122* 85 87 79 99  BUN 9 8 8 9 8   CREATININE 0.65 0.66 0.57* 0.61 0.64  CALCIUM 8.7* 8.8* 8.9 8.9 8.3*  MG 1.6* 1.8 1.9 1.7 1.8  PHOS 3.3 4.0 3.9 3.7 2.7   GFR: Estimated Creatinine Clearance: 88.5 mL/min (by C-G formula based on SCr of 0.64 mg/dL). Liver Function Tests: Recent Labs  Lab 08/18/18 1012 08/19/18 0542 08/20/18 0526 08/21/18 0506 08/22/18 0537  AST 70* 65* 72* 74* 74*  ALT 37 35 36 37 40  ALKPHOS 137* 123 146* 140* 140*  BILITOT 5.0* 4.7* 5.7* 5.7* 5.1*  PROT 5.8* 5.6* 5.8* 5.7* 5.7*  ALBUMIN 2.4* 2.3* 2.5* 2.4* 2.3*   Recent Labs  Lab 08/15/18 2029 08/16/18 0655  LIPASE 60* 63*   Recent Labs  Lab 08/18/18 1012 08/19/18 0831 08/20/18 0830 08/21/18 0928 08/22/18 0847  AMMONIA 102* 87* 90* 87* 147*   Coagulation Profile: No results for input(s): INR, PROTIME in the last 168 hours. Cardiac Enzymes: No results for input(s): CKTOTAL, CKMB, CKMBINDEX, TROPONINI in the last 168  hours. BNP (last 3 results) No results for input(s): PROBNP in the last 8760 hours. HbA1C: No results for input(s): HGBA1C in the last 72 hours. CBG: Recent Labs  Lab 08/17/18 2146  GLUCAP 98   Lipid Profile: No results for input(s): CHOL, HDL, LDLCALC, TRIG, CHOLHDL, LDLDIRECT in the last 72 hours. Thyroid Function Tests: No results for input(s): TSH, T4TOTAL, FREET4, T3FREE, THYROIDAB in the last 72 hours. Anemia Panel: No results for input(s): VITAMINB12, FOLATE, FERRITIN, TIBC, IRON, RETICCTPCT in the last 72 hours. Sepsis Labs: No results for input(s): PROCALCITON, LATICACIDVEN in the last 168 hours.  No results found for this or any previous visit (from the past 240 hour(s)).   Radiology Studies: No results found.  Scheduled Meds: . buPROPion  100 mg Oral Daily  . feeding supplement (ENSURE ENLIVE)  237 mL Oral BID BM  . FLUoxetine  20 mg Oral Daily  .  furosemide  40 mg Oral Daily  . gabapentin  300 mg Oral QHS  . lactulose  45 g Oral TID  . lactulose  300 mL Rectal BID  . midodrine  5 mg Oral TID WC  . multivitamin with minerals  1 tablet Oral Daily  . nicotine  21 mg Transdermal Daily  . potassium chloride  40 mEq Oral BID  . rifaximin  550 mg Oral BID  . spironolactone  100 mg Oral Daily   Continuous Infusions: . sodium chloride 10 mL/hr at 08/21/18 1702    LOS: 6 days   Merlene Laughter, DO [facility] PAGER is on AMION  If 7PM-7AM, please contact night-coverage [email or web address] Password [facility] 08/22/2018, 2:39 PM

## 2018-08-22 NOTE — Progress Notes (Signed)
Palliative care progress note  Reason for consult: Goals of care in light of ESLD  I checked in on Kenneth [name] today.  He is more confused.  His only concern is wanting a cigarette.  I started nicotine patch today per family request.    His sister/HCPOA is at bedside today.  I spoke with her at length and then called and discussed with his son, Kenneth [name], via phone.  While his sister is his HCPOA, she wants Kenneth [name] to be part of all decisions.  Kenneth [name] reports that he is very comfortable with arrangements (patient had earlier mentioned naming his son as HCPOA) as they have been working together on decisions and are in agreement with goals moving forward.  We discussed Kenneth [name]'s clinical course including continued decline in functional status and cognition.  Family understands that he is very ill and is at high risk of continued decompensation.  Discussed plan to continue current interventions for another 48 hours, but if he further declines, family would be agreeable to hospice discussion if it appears we are at end of life.  We discussed that in light of multiple chronic medical problems that have worsened with this acute problem, care should be focused on interventions that are likely to allow the patient to achieve goal of getting back to home and spending time with family. I discussed with family regarding heroic interventions at the end-of-life and they agree this would not be in line with prior expressed wishes for a natural death or be likely to lead to getting well enough to go back home. They were in agreement with changing CODE STATUS to DO NOT RESUSCITATE.  His sister reports that this was his wish in the past and he has actually completed MOST for noting this.  She does not know, however, where MOST form is located.  Palliative to continue to follow.  Will plan to see again on Monday.  Total time: 50 minutes Greater than 50%  of this time was spent counseling and coordinating care related to the  above assessment and plan.  Kenneth Minus, MD [facility] Palliative Medicine Team [phone]

## 2018-08-22 NOTE — Consult Note (Addendum)
Referring Provider: [facility]  Primary Care Physician:  [name], MD Primary Gastroenterologist:  Amada Jupiter, MD  Reason for Consultation: Hepatic encephalopathy  ASSESSMENT AND PLAN:    #43.  57 year old male with ESLD requiring frequent paracentesis.  Did well for a while following TIPS procedure late June but now with recurrent ascites requiring frequent LVPs. In the hospital several days now for confusion (probably HE).  -No evidence for infectious process. CXR neg. U/A not suspicious. -no evidence for GI bleed  -Up to date with [facility] surveillance. No definite liver lesions on CT scan followed by MRI in July -Ammonia does not usually correlate with degree of HE but for whatever it is worth, it is quite elevated at 147.  -continue PO lactulose, enemas already stopped. He has flapping tremors / confusion. Start Xifaxan 550 mg BID -If encephalopathy doesn't improve consider diagnostic paracentesis to rule out SBP but seems unlikely. - continue current dose of diuretics for now. Renal function okay. Monitor electrolyptes -Palliative Medicine involved in care.  -Patient didn't show for two Liver Transplant appt. He is now awaiting another referral to [facility] but in the interim continues to decline. Skeptical about candidacy for transplant at this point. Also, it appears he has limited social support. Still in hospital because no one at home to care for him at home.     #2. Hypokalemia / low Mg+, recurrent.  Magnesium barely in normal range at 1.8. -Electrolyte abnormalities initially related to N/V and diuretics. Now, deficiencies probably result of diarrhea from PO and rectal lactulose and maybe decreased PO intake.  -Repletion in progress     [facility] Attending   I have taken an interval history, reviewed the chart and examined the patient. I agree with the Advanced Practitioner's note, impression and recommendations.   Childs C +++ 14 points MELD  21 20 % 3 month mortality  I believe lack of social support would preclude transplant but we can see. Additionally he does not have insurance so unless he gets Medicaid its a non-issue.  He needs an ultrasound and diagnostic paracentesis in case he has SBP  Will order - he cannot consent right now though  Iva Boop, MD, [facility] 08/22/2018 4:08 PM Pager [phone]    HPI: MERL BOMMARITO is a 57 y.o. male with history of cirrhosis,?  Alcohol related.  No alcohol in years.  He underwent TIPS procedure late July of this year with good response.  A few weeks later he was seen in the office for worsening ascites, aggressive coagulopathy and worsening hyperbilirubinemia.  Dr. Myrtie Neither spoke with transplant hepatologist Dr. Hamilton Capri. An appt was made but patient canceled the appointment because he was reportedly too frightened.  Turns out his resumption in order of the appointment however.  Another appointment was made to see Dr. Hamilton Capri, patient did not go.  Patient says he felt nauseated and unwell enough to make a trip but went on later to say that he did not want a liver transplant.  Patient cares for his mother with dementia  Patient has since been seen in the office a couple of times.  His compliance to low-sodium diet has been questionable.. Diuresis has been limited by hypotension.  We started him on midodrine in hopes of being able to increase diuretic dose.  Patient has been requiring regular therapeutic paracentesis.  Last seen on the 16th of this month at which time he complained of bloating and distention with nausea and  vomiting.Marland Kitchen  He was noted that we had made changes to diuretics based on labs prior to that visit.  This was relayed to the patient through a phone call from 1 of our office nurses.  Changes were not implemented, patient cannot remember having had a conversation the nurse.  At the recent office visit we increased his amiloride 10 mg daily, continued Lasix  60 mg daily.  Lactulose To current dose.  Monitoring The current dose.  Patient had changed his mind about liver transplant and was now interested in pursuing work-up.  We referred lactulose to [facility].  In the interim, we arranged for another therapeutic LVP. Patient was admitted through the ED 10/ 20 with altered mental status.  He had some electrolyte abnormalities felt to be due to vomiting and diuretics.  Electrolytes were repleted.  Hepatic encephalopathy treated with lactulose . Repeat therapeutic LVP was not needed.  Was supposed to be discharged home on 10/22 but apparently there was no one at home to care for him .  He has been awaiting SNF placement.  In the interim, 2 days ago, patient developed confusion again.  He had not been having bowel movements so her lactulose dose was increased, retention enemas given.  Yesterday patient still intermittently confused despite multiple bowel movements.  Past Medical History:  Diagnosis Date  . Alcohol abuse   . Anxiety   . Arthritis    "never diagnosis"  . Blood clot in vein   . Cirrhosis (HCC)   . Depression   . Dyspnea    Due to ascites  . Esophageal varices (HCC) 09/02/2014  . H/O [facility] spotted fever    age 46 or 23  . Hernia, inguinal    right  . Hiatal hernia 09/02/2014  . Polysubstance abuse (HCC)   . Portal hypertension with esophageal varices (HCC) 09/02/2014  . Portal hypertensive gastropathy (HCC) 09/02/2014  . Thrombocytopenia (HCC) 09/02/2014  . Upper GI bleed 08/31/2014    Past Surgical History:  Procedure Laterality Date  . ESOPHAGOGASTRODUODENOSCOPY Left 09/01/2014   Procedure: ESOPHAGOGASTRODUODENOSCOPY (EGD);  Surgeon: Charna Elizabeth, MD;  Location: [facility] END[facility];  Service: Endoscopy;  Laterality: Left;  . EYE SURGERY Left    age 21  . IR PARACENTESIS  03/16/2018  . IR PARACENTESIS  03/29/2018  . IR PARACENTESIS  04/19/2018  . IR PARACENTESIS  04/27/2018  . IR PARACENTESIS  05/10/2018  . IR  PARACENTESIS  05/19/2018  . IR PARACENTESIS  06/11/2018  . IR PARACENTESIS  06/29/2018  . IR PARACENTESIS  07/16/2018  . IR PARACENTESIS  08/03/2018  . IR RADIOLOGIST EVAL & MGMT  05/13/2018  . IR RADIOLOGIST EVAL & MGMT  06/29/2018  . IR TIPS  05/19/2018  . RADIOLOGY WITH ANESTHESIA N/A 05/19/2018   Procedure: TIPS;  Surgeon: Simonne Come, MD;  Location: [facility] OR;  Service: Radiology;  Laterality: N/A;    Prior to Admission medications   Medication Sig Start Date End Date Taking? Authorizing Provider  aMILoride (MIDAMOR) 5 MG tablet Take 2 tablets (10 mg total) by mouth daily. 08/11/18  Yes Danis, Andreas Blower, MD  buPROPion (WELLBUTRIN SR) 150 MG 12 hr tablet Take 1 tablet (150 mg total) by mouth 2 (two) times daily. 08/02/18  Yes [name], MD  ergocalciferol (DRISDOL) 50000 units capsule Take 1 capsule (50,000 Units total) by mouth once a week. 08/03/18  Yes [name], MD  FLUoxetine (PROZAC) 40 MG capsule Take 1 capsule (40 mg total) by mouth  daily. 08/02/18  Yes [name], MD  furosemide (LASIX) 40 MG tablet Take 1.5 tablets (60 mg total) by mouth daily. 07/21/18  Yes Esterwood, Amy S, PA-C  gabapentin (NEURONTIN) 300 MG capsule Take 1 capsule (300 mg total) by mouth at bedtime. 08/02/18  Yes [name], MD  lactulose (CHRONULAC) 10 GM/15ML solution Increase to 4 tablespoons by mouth twice daily. 07/21/18  Yes Esterwood, Amy S, PA-C  midodrine (PROAMATINE) 5 MG tablet Take 1 tablet (5 mg total) by mouth 3 (three) times daily with meals. 07/21/18  Yes Esterwood, Amy S, PA-C  ondansetron (ZOFRAN) 4 MG tablet Take 1 tablet (4 mg total) by mouth every 8 (eight) hours as needed for nausea or vomiting. 08/11/18  Yes Danis, Andreas Blower, MD  buPROPion (WELLBUTRIN SR) 100 MG 12 hr tablet Take 1 tablet (100 mg total) by mouth daily. 08/18/18   Calvert Cantor, MD  diclofenac sodium (VOLTAREN) 1 % GEL Apply 4 g topically 4 (four) times daily. Patient not taking: Reported on 08/15/2018 12/15/17    [name], MD  feeding supplement, ENSURE ENLIVE, (ENSURE ENLIVE) LIQD Take 237 mLs by mouth 2 (two) times daily between meals. 08/18/18   Calvert Cantor, MD  Multiple Vitamin (MULTIVITAMIN WITH MINERALS) TABS tablet Take 1 tablet by mouth daily. 08/18/18   Calvert Cantor, MD  Potassium Chloride ER 20 MEQ TBCR Take 40 mEq by mouth daily. 08/17/18   Calvert Cantor, MD    Current Facility-Administered Medications  Medication Dose Route Frequency Provider Last Rate Last Dose  . 0.9 %  sodium chloride infusion   Intravenous PRN Marguerita Merles [place], DO 10 mL/hr at 08/21/18 1702    . buPROPion (WELLBUTRIN SR) 12 hr tablet 100 mg  100 mg Oral Daily Calvert Cantor, MD   100 mg at 08/22/18 1006  . feeding supplement (ENSURE ENLIVE) (ENSURE ENLIVE) liquid 237 mL  237 mL Oral BID BM Eduard Clos, MD   237 mL at 08/22/18 1006  . FLUoxetine (PROZAC) capsule 20 mg  20 mg Oral Daily Marguerita Merles [place], DO   20 mg at 08/22/18 1006  . furosemide (LASIX) tablet 40 mg  40 mg Oral Daily Marguerita Merles [place], DO   40 mg at 08/22/18 1006  . gabapentin (NEURONTIN) capsule 300 mg  300 mg Oral QHS Eduard Clos, MD   300 mg at 08/21/18 2250  . lactulose (CHRONULAC) 10 GM/15ML solution 45 g  45 g Oral TID Marguerita Merles Latif, DO   45 g at 08/22/18 1006  . lactulose (CHRONULAC) enema 200 gm  300 mL Rectal BID Sheikh, Omair Latif, DO      . midodrine (PROAMATINE) tablet 5 mg  5 mg Oral TID WC Eduard Clos, MD   5 mg at 08/20/18 0820  . multivitamin with minerals tablet 1 tablet  1 tablet Oral Daily Calvert Cantor, MD   1 tablet at 08/22/18 1005  . nicotine (NICODERM CQ - dosed in mg/24 hours) patch 21 mg  21 mg Transdermal Daily Neale Burly, Gene, MD   21 mg at 08/22/18 1226  . ondansetron (ZOFRAN) tablet 4 mg  4 mg Oral Q6H PRN Eduard Clos, MD       Or  . ondansetron [facility]) injection 4 mg  4 mg Intravenous Q6H PRN Eduard Clos, MD   4 mg at 08/20/18 1626  . potassium chloride SA  (K-DUR,KLOR-CON) CR tablet 40 mEq  40 mEq Oral BID Sheikh, Omair Latif, DO   40 mEq  at 08/22/18 1005  . promethazine (PHENERGAN) injection 6.25 mg  6.25 mg Intravenous Q6H PRN Sheikh, Omair Latif, DO      . spironolactone (ALDACTONE) tablet 100 mg  100 mg Oral Daily Calvert Cantor, MD   100 mg at 08/22/18 1005    Allergies as of 08/15/2018 - Review Complete 08/15/2018  Allergen Reaction Noted  . Latex Swelling and Rash 01/24/2016    Family History  Problem Relation Age of Onset  . Leukemia Father        acute myloid   . Hyperlipidemia Father   . Hypertension Father   . Colon cancer Neg Hx   . Esophageal cancer Neg Hx   . Pancreatic cancer Neg Hx   . Stomach cancer Neg Hx   . Liver disease Neg Hx     Social History   Tobacco Use  . Smoking status: Current Every Day Smoker    Packs/day: 0.50    Years: 10.00    Pack years: 5.00    Types: Cigarettes  . Smokeless tobacco: Never Used  Substance Use Topics  . Alcohol use: No    Comment: no alcohol since 01/23/16  . Drug use: Yes    Frequency: 5.0 times per week    Types: Marijuana      Review of Systems: Confused. Unable to obtain.  Physical Exam: Vital signs in last 24 hours: Temp:  [98.1 F (36.7 C)-98.4 F (36.9 C)] 98.1 F (36.7 C) (10/27 0538) Pulse Rate:  [76-81] 81 (10/27 0538) Resp:  [18] 18 (10/27 0538) BP: (99-107)/(71-74) 107/74 (10/27 0538) SpO2:  [98 %] 98 % (10/27 0538) Last BM Date: 08/22/18 General:   white male who I awoke from sleep Psych:   cooperative.  Eyes:  Icteric sclera. Ears:  Normal auditory acuity. Nose:  No deformity, discharge,  or lesions. Neck:  Supple; no masses Lungs:  Clear throughout to auscultation.   No wheezes, crackles, or rhonchi.  Heart:  Regular rate and rhythm;  Abdomen:  Soft, mildly distended, nontender, BS active,  + ascites suspected Msk:  Symmetrical without gross deformities. . Neurologic:  Alert, follows commands. Knows he is in hospital but thinks it is year  2012. Has asterixis - flapping.  Skin:  Intact without significant lesions or rashes..   Intake/Output from previous day: 10/26 0701 - 10/27 0700 In: 510 [P.O.:480; I.V.:30] Out: 1000 [Urine:1000]  Lab Results: Recent Labs    08/20/18 0526 08/21/18 0506 08/22/18 0537  WBC 7.0 5.9 5.8  HGB 12.8* 12.5* 12.4*  HCT 39.5 38.5* 37.5*  PLT 84* 78* 74*   BMET Recent Labs    08/20/18 0526 08/21/18 0506 08/22/18 0537  NA 142 143 140  K 3.6 4.1 3.1*  CL 103 106 102  CO2 30 29 32  GLUCOSE 87 79 99  BUN 8 9 8   CREATININE 0.57* 0.61 0.64  CALCIUM 8.9 8.9 8.3*   LFT Recent Labs    08/22/18 0537  PROT 5.7*  ALBUMIN 2.3*  AST 74*  ALT 40  ALKPHOS 140*  BILITOT 5.1*     Willette Cluster, NP-C @  08/22/2018, 1:17 PM

## 2018-08-23 ENCOUNTER — Encounter (HOSPITAL_COMMUNITY): Payer: Self-pay | Admitting: Radiology

## 2018-08-23 ENCOUNTER — Inpatient Hospital Stay (HOSPITAL_COMMUNITY): Payer: Medicaid Other

## 2018-08-23 DIAGNOSIS — E7220 Disorder of urea cycle metabolism, unspecified: Secondary | ICD-10-CM

## 2018-08-23 DIAGNOSIS — K7201 Acute and subacute hepatic failure with coma: Secondary | ICD-10-CM

## 2018-08-23 HISTORY — PX: IR PARACENTESIS: IMG2679

## 2018-08-23 LAB — COMPREHENSIVE METABOLIC PANEL
ALK PHOS: 128 U/L — AB (ref 38–126)
ALT: 44 U/L (ref 0–44)
ANION GAP: 6 (ref 5–15)
AST: 78 U/L — ABNORMAL HIGH (ref 15–41)
Albumin: 2.1 g/dL — ABNORMAL LOW (ref 3.5–5.0)
BUN: 6 mg/dL (ref 6–20)
CALCIUM: 8.3 mg/dL — AB (ref 8.9–10.3)
CO2: 27 mmol/L (ref 22–32)
Chloride: 105 mmol/L (ref 98–111)
Creatinine, Ser: 0.59 mg/dL — ABNORMAL LOW (ref 0.61–1.24)
GFR calc non Af Amer: >60 mL/min (ref >60)
Glucose, Bld: 95 mg/dL (ref 70–99)
POTASSIUM: 4 mmol/L (ref 3.5–5.1)
Sodium: 138 mmol/L (ref 135–145)
TOTAL PROTEIN: 5.5 g/dL — AB (ref 6.5–8.1)
Total Bilirubin: 5.1 mg/dL — ABNORMAL HIGH (ref 0.3–1.2)

## 2018-08-23 LAB — CBC WITH DIFFERENTIAL/PLATELET
Abs Immature Granulocytes: 0.04 10*3/uL (ref 0.00–0.07)
BASOS ABS: 0.1 10*3/uL (ref 0.0–0.1)
BASOS PCT: 1 %
EOS ABS: 0.2 10*3/uL (ref 0.0–0.5)
EOS PCT: 3 %
HCT: 37.5 % — ABNORMAL LOW (ref 39.0–52.0)
HEMOGLOBIN: 12.3 g/dL — AB (ref 13.0–17.0)
Immature Granulocytes: 1 %
LYMPHS PCT: 16 %
Lymphs Abs: 0.9 10*3/uL (ref 0.7–4.0)
MCH: 31.4 pg (ref 26.0–34.0)
MCHC: 32.8 g/dL (ref 30.0–36.0)
MCV: 95.7 fL (ref 80.0–100.0)
MONO ABS: 1.2 10*3/uL — AB (ref 0.1–1.0)
Monocytes Relative: 21 %
NRBC: 0 % (ref 0.0–0.2)
Neutro Abs: 3.3 10*3/uL (ref 1.7–7.7)
Neutrophils Relative %: 58 %
PLATELETS: 65 10*3/uL — AB (ref 150–400)
RBC: 3.92 MIL/uL — AB (ref 4.22–5.81)
RDW: 17.2 % — AB (ref 11.5–15.5)
WBC: 5.7 10*3/uL (ref 4.0–10.5)

## 2018-08-23 LAB — MAGNESIUM: Magnesium: 1.6 mg/dL — ABNORMAL LOW (ref 1.7–2.4)

## 2018-08-23 LAB — URINE CULTURE: Culture: <10000 — AB

## 2018-08-23 LAB — BODY FLUID CELL COUNT WITH DIFFERENTIAL
EOS FL: 1 %
LYMPHS FL: 20 %
Monocyte-Macrophage-Serous Fluid: 65 % (ref 50–90)
NEUTROPHIL FLUID: 14 % (ref 0–25)
Total Nucleated Cell Count, Fluid: 261 cu mm (ref 0–1000)

## 2018-08-23 LAB — PROTIME-INR
INR: 2.59
PROTHROMBIN TIME: 27.4 s — AB (ref 11.4–15.2)

## 2018-08-23 LAB — PHOSPHORUS: PHOSPHORUS: 2.4 mg/dL — AB (ref 2.5–4.6)

## 2018-08-23 LAB — AMMONIA: AMMONIA: 53 umol/L — AB (ref 9–35)

## 2018-08-23 MED ORDER — POTASSIUM CHLORIDE CRYS ER 20 MEQ PO TBCR
40.0000 meq | EXTENDED_RELEASE_TABLET | Freq: Once | ORAL | Status: AC
  Administered 2018-08-23: 40 meq via ORAL
  Filled 2018-08-23: qty 2

## 2018-08-23 MED ORDER — MAGNESIUM SULFATE 2 GM/50ML IV SOLN
2.0000 g | Freq: Once | INTRAVENOUS | Status: AC
  Administered 2018-08-23: 2 g via INTRAVENOUS
  Filled 2018-08-23: qty 50

## 2018-08-23 MED ORDER — K PHOS MONO-SOD PHOS DI & MONO 155-852-130 MG PO TABS
500.0000 mg | ORAL_TABLET | Freq: Two times a day (BID) | ORAL | Status: AC
  Administered 2018-08-23 (×2): 500 mg via ORAL
  Filled 2018-08-23 (×2): qty 2

## 2018-08-23 MED ORDER — LIDOCAINE HCL 1 % IJ SOLN
INTRAMUSCULAR | Status: DC | PRN
  Administered 2018-08-23: 10 mL via INTRADERMAL

## 2018-08-23 MED ORDER — LIDOCAINE HCL 1 % IJ SOLN
INTRAMUSCULAR | Status: AC
  Filled 2018-08-23: qty 20

## 2018-08-23 NOTE — Progress Notes (Signed)
Patient son called this RN into the room and wanted to discuss patients code status. Patient decided he wanted to be a full code and "have everything done." This RN paged the on call provide K. Sofia. New orders received.

## 2018-08-23 NOTE — Procedures (Signed)
Ultrasound-guided diagnostic and therapeutic paracentesis performed yielding 750 cc of yellow fluid. No immediate complications.  A portion of the fluid was submitted to the lab for preordered studies.

## 2018-08-23 NOTE — Progress Notes (Addendum)
CSW following patient for discharge planning. Patient recommended for placement, barriers of no payor source.  Patient has one SNF bed offer, [facility]. CSW reaching out to [facility] to determine agreeableness to a two week Letter of Guarantee.    Patient PASRR approved for 30 day SNF. PASSR # [id number]    CSW informed by palliative care that patient's sister, [name] is HCPOA.   CSW received call back from [facility]. [name] would consider taking the patient on a 30 day (or longer) LOG. CSW to update SW Chiropodist. Patient has no other SNF offers at this time.   Enid Cutter, LCSW-A Clinical Social Worker [phone]

## 2018-08-23 NOTE — Progress Notes (Signed)
Palliative care progress note  Reason for consult: Goals of care in light of ESLD  I checked in on [name] today.  He is much clearer today.  Reports feeling better with no memory of the past several days.  Spoke with his sister/HCPOA via phone.    We discussed [name]'s clinical course and improvement today.  She is thankful that he is doing better, but also understands the gravity of his overall situation.  We discussed plan to continue to work toward finding placement for him as he is not safe to be alone.  Family is invested in his well-being, but unfortunately, there is nowhere for him to go with continued supervision, which is his need at this point.  - While his sister is his HCPOA, she wants his son Kenneth [name] to be part of all decisions.  Kenneth [name] reports that he is very comfortable with arrangement (patient had earlier mentioned naming his son as HCPOA) as he and Kenneth [name] have been working together on decisions and are in agreement with goals moving forward.    Palliative to continue to follow intermittently.  Please call with any specific needs.  Total time: 30 minutes Greater than 50%  of this time was spent counseling and coordinating care related to the above assessment and plan.  Romie Minus, MD [facility] Palliative Medicine Team [phone]

## 2018-08-23 NOTE — Progress Notes (Signed)
PROGRESS NOTE    Kenneth [name]  ZOX:[id number] DOB: [birth year] DOA: 08/15/2018 PCP: [name], MD   Brief Narrative:  Kenneth [name] Her a 57 y.o.malewithhistory of hepatic cirrhosis status post TIPS procedure with history of esophageal varices who has had recent paracentesis done 2 weeks ago was brought to the ER after patient's family found patient was increasingly confused. Patient also states that over thepast 4 days patient felt that his abdomen is getting more distendedand he has has episodes of nausea and vomiting.   In ED >Ammonia 79, K 3.1, oriented to self and place, abdomen distended- given IVF and K.   S/p IR guided paracentesis on 10/16, ~ 4 L removed.  PT evaluated and recommending SNF and social work involved for assistance with placement.   Patient continued to remain severely confused gastroenterology was consulted for further evaluation recommendations and placed the patient on Xifaxan.  This morning patient is much more alert.  Patient underwent diagnostic paracentesis and yielded 750 mils today  Assessment & Plan:   Principal Problem:   Hepatic encephalopathy (HCC) Active Problems:   Portal hypertension with esophageal varices (HCC)   Thrombocytopenia (HCC)   Alcoholic cirrhosis of liver with ascites (HCC)   Depression   Hypokalemia   Hypomagnesemia   Malnutrition of moderate degree   Accelerated junctional rhythm   Acute liver failure with hepatic coma (HCC)  Hepatic Encephalopathy  Hyperammonemia  -Cont Lactulose- Remains intermittently confused  -Ammonia Level went from 79 -> 102 -> 87 -> 90 -> 87 -> 147 -> 53 -Increased Lactulose 45 g p.o. 3 times daily yesterday and will continue today  -Started the patient on lactulose enemas and have ordered 300 mL rectal yesterday and increased to BID but have now stopped -I have consulted Gastroenterology for further evaluation and assistance. -Gastroenterology recommending starting Xifaxan 550 mill  grams p.o. twice daily -Gastroenterology recommended obtaining a diagnostic and therapeutic Paracentesis to rule out SBP and 750 cc of yellow fluid removed and a portion of the fluid was submitted to lab for preordered studies by Gastroenterology; Does not Appear to SBP -Ascitic Fluid sent for Cx and is pending  -GI recommending continuing current dose of diuretics and monitoring and repleting electrolytes -I doubt this is infectious as UA was unremarkable however I have ordered a urine culture (<10,000 CFU of Insignificant Growth) and Blood cultures (NGTD < 24 Hours) but the patient is afebrile and has no white count -Per GI patient did not show up for his liver transplant appointment however they are skeptical about his candidacy for liver transplant at this point now -Continue to Monitor and repeat Ammonia Level in AM  -Start Delirium Precautions  -Appreciate GI evaluation further assistance -Palliative Care consulted for further assistance and GOC; treatment with the patient and his sister as well as his son and current plans to continue interventions for another 40 hours and if he further declines and would be agreeable to hospice discussion however patient is significantly improved but prognosis is still very poor.  Appreciate Dr. Onnie Boer evaluation recommendations.  Hypokalemia Hypomagnesemia -Was due t vomiting and diuretics -Replacingvia IV and orally -K+ is4.0 today -Mg+ was1.6 this AM but will give IV Mag Sulfate 2 grams -Try to achieve K+ > 4.0 and Mg+ > 2.0 -Continue to monitor and replete as necessary -Repeat CMP in the a.m. along with mag level  Accelerated Junctional rhythm -Corrected electrolyte abnormalities- HR is still junctional in 60s -Cave asked for Cardiology eval- Cardiology does not feel any  further work up is needed as he is asymptomatic and Dr. Butler Denmark discussed with Dr Delton See  - BP stable in 90- 100s- cont Midodrine for this  Nausea and Vomiting,  improved  -Continue with Antiemetics with po/IV Zofran -Patient has Phenergan 6.25 mg IV q6hPRN for Breakthrough Nausea and Vomiting -GI Consulted for Hepatic Encephalopathy and following -Appreciate any further reccs for N/V  Chronic Hypotension -Continue Midodrine 5 mg p.o. 3 times daily with meals  Alcoholic cirrhosis of liver with ascites  -S/p paracentesis as mentioned above in HPI -Abdomen is non-tender and not severely distended -No need for another paracentesis- stop Ceftriaxone -Resumed oral diuretics with Spironolactone 100 mg po Daily -C/w 1200 fluid restriction added -Resumed Home Lasix at 2/3 of the Home dose and started 40 mg po Daily and will continue for now and may increased to Home dose tomorrow  -No longer drinks alcohol -Palliative care consulted for goals of care discussion appreciate palliative evaluation -GI Consulted for further evaluation and recommendations -Gastroenterology started the patient on Xifaxan 550 mg p.o. twice daily  Advanced Cirrhosis Portal hypertension with esophageal varices Splenomegaly Elevated INR  (2.57) Hyperbilirubinemia (T Bili is 5.1) Abnormal LFTs (AST is mildly off and elevated to 74) -s/p TIPS -As above -GI now following; patient was supposed to follow-up with the liver transplant specialist and had a liver transplant appointment however did not show for it and is now awaiting other referral [facility] but continues to decline. -GI ordered Diagnostic and Therapeutic Paracentesis and yielded 750 mL of yellow fluid   Thrombocytopenia  -Chronicand stable -likely related to cirrhosis splenomegaly -Platelet Count now 65,000  Depression/Anxiety -Welbutrin dose has been changed from 150 > 100 mg daily due to poor liver function -Changed with Fluoxetine 40 mg p.o. Daily to 20 mg po Daily, and Gabapentin 300 mg p.o. nightly -? Wellbutrin causing more tremors however they have been improving   Normocytic  Anemia -Since hemoglobin/hematocrit is stable at 12.3/37.5 -In the setting of liver cirrhosis -Continue monitor for signs and symptoms of bleeding -Repeat CBC in the a.m.  Non-severe/Moderate malnutrition in the context of chronic illness -Nutrition is consulted for further evaluation recommendations -Nutrition is recommending Ensure Enlive p.o. twice daily as well as encouraging p.o. intakes  DVT prophylaxis: SCDs given thrombocytopenia Code Status: FULL CODE Family Communication: No family present at bedside  Disposition Plan: Likely SNF now as patient is unsafe discharge plan to go home when he is medically stable  Consultants:   Cardiology  Palliative care medicine  Gastroenterology   Procedures:  Paracentesis x2   Antimicrobials:  Anti-infectives (From admission, onward)   Start     Dose/Rate Route Frequency Ordered Stop   08/22/18 1500  rifaximin (XIFAXAN) tablet 550 mg     550 mg Oral 2 times daily 08/22/18 1421     08/16/18 0115  cefTRIAXone (ROCEPHIN) 2 g in sodium chloride 0.9 % 100 mL IVPB  Status:  Discontinued     2 g 200 mL/hr over 30 Minutes Intravenous Daily at bedtime 08/16/18 0058 08/16/18 0906     Subjective: Seen and examined at bedside was much more alert and oriented and knew where he was.  To the month, date, year and who the president was.  Able to tell me where he lives.  Denies chest pain, lightheadedness or dizziness.  States he had some slight abdominal soreness and distention but it was not bad.  States he did not think he had a bowel move.  No  other concerns or complaints at this time  Objective: Vitals:   08/23/18 0427 08/23/18 1210 08/23/18 1231 08/23/18 1407  BP: 103/72 106/75 103/71 103/74  Pulse: 78 74 75 75  Resp: 18   12  Temp: 98.4 F (36.9 C)   97.8 F (36.6 C)  TempSrc: Oral   Oral  SpO2: 99% 99% 98% 99%  Weight:      Height:        Intake/Output Summary (Last 24 hours) at 08/23/2018 1513 Last data filed at 08/23/2018  1500 Gross per 24 hour  Intake 2204.72 ml  Output 901 ml  Net 1303.72 ml   Filed Weights   08/15/18 2352  Weight: 61.4 kg   Examination: Physical Exam:  Constitutional: Frail, cachectic, chronically ill-appearing Caucasian male is currently in no acute distress and is not confused today Eyes: Sclera is icteric.  Lids and conjunctive are normal ENMT: External ears and nose appear normal.  Grossly normal hearing.  Mucous members are moist Neck: Appears supple no JVD Respiratory: Diminished to auscultation bilaterally no appreciable wheezing, rales, rhonchi.  Patient was not tachypneic wheezing excess muscle breathe Cardiovascular: Regular rate and rhythm.  No appreciable murmurs, rubs, gallops Abdomen: Soft, slightly tender.  His umbilical hernia that is noted.  Bowel sounds present all 4 quadrants and are slightly hyperactive GU: Deferred as patient is wearing a condom catheter Musculoskeletal: No contractures or cyanosis.  No joint deformity noted Skin: Skin is warm and dry.  No appreciable rashes or lesions but patient is still jaundiced Neurologic: Cranial nerves II through XII grossly intact no appreciable focal deficits but does still have some asterixis Psychiatric: Normal judgment and insight.  Patient is awake, alert and oriented x3.  Data Reviewed: I have personally reviewed following labs and imaging studies  CBC: Recent Labs  Lab 08/19/18 0542 08/20/18 0526 08/21/18 0506 08/22/18 0537 08/23/18 0555  WBC 5.8 7.0 5.9 5.8 5.7  NEUTROABS 3.7 4.7 3.2 3.4 3.3  HGB 12.2* 12.8* 12.5* 12.4* 12.3*  HCT 38.0* 39.5 38.5* 37.5* 37.5*  MCV 98.2 96.6 96.7 95.4 95.7  PLT 77* 84* 78* 74* 65*   Basic Metabolic Panel: Recent Labs  Lab 08/19/18 0542 08/20/18 0526 08/21/18 0506 08/22/18 0537 08/23/18 0555  NA 142 142 143 140 138  K 3.8 3.6 4.1 3.1* 4.0  CL 106 103 106 102 105  CO2 30 30 29  32 27  GLUCOSE 85 87 79 99 95  BUN 8 8 9 8 6   CREATININE 0.66 0.57* 0.61 0.64  0.59*  CALCIUM 8.8* 8.9 8.9 8.3* 8.3*  MG 1.8 1.9 1.7 1.8 1.6*  PHOS 4.0 3.9 3.7 2.7 2.4*   GFR: Estimated Creatinine Clearance: 88.5 mL/min (A) (by C-G formula based on SCr of 0.59 mg/dL (L)). Liver Function Tests: Recent Labs  Lab 08/19/18 0542 08/20/18 0526 08/21/18 0506 08/22/18 0537 08/23/18 0555  AST 65* 72* 74* 74* 78*  ALT 35 36 37 40 44  ALKPHOS 123 146* 140* 140* 128*  BILITOT 4.7* 5.7* 5.7* 5.1* 5.1*  PROT 5.6* 5.8* 5.7* 5.7* 5.5*  ALBUMIN 2.3* 2.5* 2.4* 2.3* 2.1*   No results for input(s): LIPASE, AMYLASE in the last 168 hours. Recent Labs  Lab 08/19/18 0831 08/20/18 0830 08/21/18 0928 08/22/18 0847 08/23/18 0555  AMMONIA 87* 90* 87* 147* 53*   Coagulation Profile: Recent Labs  Lab 08/23/18 0555  INR 2.59   Cardiac Enzymes: No results for input(s): CKTOTAL, CKMB, CKMBINDEX, TROPONINI in the last 168 hours. BNP (last 3 results)  No results for input(s): PROBNP in the last 8760 hours. HbA1C: No results for input(s): HGBA1C in the last 72 hours. CBG: Recent Labs  Lab 08/17/18 2146  GLUCAP 98   Lipid Profile: No results for input(s): CHOL, HDL, LDLCALC, TRIG, CHOLHDL, LDLDIRECT in the last 72 hours. Thyroid Function Tests: No results for input(s): TSH, T4TOTAL, FREET4, T3FREE, THYROIDAB in the last 72 hours. Anemia Panel: No results for input(s): VITAMINB12, FOLATE, FERRITIN, TIBC, IRON, RETICCTPCT in the last 72 hours. Sepsis Labs: No results for input(s): PROCALCITON, LATICACIDVEN in the last 168 hours.  Recent Results (from the past 240 hour(s))  Culture, Urine     Status: Abnormal   Collection Time: 08/22/18 10:26 AM  Result Value Ref Range Status   Specimen Description   Final    URINE, CLEAN CATCH Performed at [facility], [address] [place], [place] [address]    Special Requests   Final    NONE Performed at [facility], [address] [place], [place] [address]    Culture (A)  Final     <10,000 COLONIES/mL INSIGNIFICANT GROWTH Performed at [facility], [address], [place], [place] [address]    Report Status 08/23/2018 FINAL  Final  Culture, blood (routine x 2)     Status: None (Preliminary result)   Collection Time: 08/22/18 11:02 AM  Result Value Ref Range Status   Specimen Description   Final    BLOOD RIGHT ARM Performed at [facility], [address] [place], [place] [address]    Special Requests   Final    BAA BCAV Performed at [facility] [address], [place], [place] [address]    Culture   Final    NO GROWTH < 24 HOURS Performed at [facility], [address] [place], [place] [address]    Report Status PENDING  Incomplete  Culture, blood (routine x 2)     Status: None (Preliminary result)   Collection Time: 08/22/18 11:02 AM  Result Value Ref Range Status   Specimen Description   Final    BLOOD LEFT ARM Performed at [facility], [address], [place], [place] [address]    Special Requests   Final    BAA BCAV Performed at [facility], [address] [place], [place] [address]    Culture   Final    NO GROWTH < 24 HOURS Performed at [facility], [address], [place], [place] [address]    Report Status PENDING  Incomplete     Radiology Studies: Ir Paracentesis  Result Date: 08/23/2018 INDICATION: Patient with history of end-stage liver disease/cirrhosis with prior TIPS, recurrent ascites; request made for diagnostic and therapeutic paracentesis. EXAM: ULTRASOUND GUIDED DIAGNOSTIC AND THERAPEUTIC PARACENTESIS MEDICATIONS: None COMPLICATIONS: None immediate. PROCEDURE: Informed written consent was obtained from the patient after a discussion of the risks, benefits and alternatives to treatment. A timeout was performed prior to the initiation of the procedure. Initial ultrasound scanning demonstrates a small amount of ascites within the left  lower abdominal quadrant. The left lower abdomen was prepped and draped in the usual sterile fashion. 1% lidocaine was used for local anesthesia. Following this, a 19 gauge, 7-cm, Yueh catheter was introduced. An ultrasound image was saved for documentation purposes. The paracentesis was performed. The catheter was removed and a dressing was applied. The patient tolerated the procedure well without immediate post procedural complication. FINDINGS: A  total of approximately 750 cc of yellow fluid was removed. Samples were sent to the laboratory as requested by the clinical team. IMPRESSION: Successful ultrasound-guided diagnostic and therapeutic paracentesis yielding 750 cc of peritoneal fluid. Read by: Jeananne Rama, PA-C Electronically Signed   By: Malachy Moan M.D.   On: 08/23/2018 13:01    Scheduled Meds: . buPROPion  100 mg Oral Daily  . feeding supplement (ENSURE ENLIVE)  237 mL Oral BID BM  . FLUoxetine  20 mg Oral Daily  . furosemide  40 mg Oral Daily  . gabapentin  300 mg Oral QHS  . lactulose  45 g Oral TID  . midodrine  5 mg Oral TID WC  . multivitamin with minerals  1 tablet Oral Daily  . nicotine  21 mg Transdermal Daily  . phosphorus  500 mg Oral BID  . rifaximin  550 mg Oral BID  . spironolactone  100 mg Oral Daily   Continuous Infusions: . sodium chloride Stopped (08/23/18 1018)    LOS: 7 days   Merlene Laughter, DO [facility] PAGER is on AMION  If 7PM-7AM, please contact night-coverage [email or web address] Password [email or web address] 08/23/2018, 3:13 PM

## 2018-08-23 NOTE — Progress Notes (Signed)
[facility] Progress Note   Chief Complaint:   Hepatic encephalopathy   SUBJECTIVE:    nauseated after drinking Ensure. No abdominal pain  ASSESSMENT    #85.  57 year old male with ESLD requiring frequent paracentesis.  MELD 20. Did well for a while following TIPS procedure late June but now with recurrent ascites requiring frequent LVPs. In the hospital several days now for N/V and confusion (probably HE). -He is alert and conversant today, major improvement. Still + asterixis  -No evidence for infectious process. CXR neg. U/A not suspicious.  -no evidence for GI bleed  -Up to date with [facility] surveillance. No definite liver lesions on CT scan followed by MRI in July -Ammonia does not usually correlate with degree of HE but was 147 today, down to 53 today -Palliative Medicine following. Plan was for Hospice in a couple of days in no improvement but there has short some improvement in HE overnight. Unfortunately the overall picture is still grim. .  -Patient didn't show for two Liver Transplant appts. He is now awaiting another referral to [facility] but in the interim continues to decline. Skeptical about candidacy for transplant at this point. Also, it appears he has limited social support. Still in hospital because no one at home to care for him at home.     PLAN:   -Awaiting dx paracentesis to rule out SBP -Continue PO lactulose and Xifaxan (added yesterday) -continue current dose of diuretics for now. He had 900 ml out yesterday. -Continue to monitor electrolytes.  Na+, K+ are okay   OBJECTIVE:     Vital signs in last 24 hours: Temp:  [98.2 F (36.8 C)-98.4 F (36.9 C)] 98.4 F (36.9 C) (10/28 0427) Pulse Rate:  [75-78] 78 (10/28 0427) Resp:  [12-20] 18 (10/28 0427) BP: (103-109)/(72-83) 103/72 (10/28 0427) SpO2:  [97 %-99 %] 99 % (10/28 0427) Last BM Date: 08/22/18 General:   Alert, thin male in NAD EENT:  Normal hearing, icteric sclera   Heart:  Regular rate and rhythm. No lower extremity edema Pulm: Normal respiratory effort Abdomen:  Soft, nondistended, nontender. A few hyperactive bowel sounds.     Neurologic:  Alert and  oriented x3. Asterixis on exam. Psych:  Pleasant, cooperative.  Normal mood and affect.   Intake/Output from previous day: 10/27 0701 - 10/28 0700 In: 1593.6 [P.O.:840; I.V.:406.9; IV Piggyback:346.8] Out: 901 [Urine:900; Stool:1] Intake/Output this shift: Total I/O In: 360 [P.O.:360] Out: -   Lab Results: Recent Labs    08/21/18 0506 08/22/18 0537 08/23/18 0555  WBC 5.9 5.8 5.7  HGB 12.5* 12.4* 12.3*  HCT 38.5* 37.5* 37.5*  PLT 78* 74* 65*   BMET Recent Labs    08/21/18 0506 08/22/18 0537 08/23/18 0555  NA 143 140 138  K 4.1 3.1* 4.0  CL 106 102 105  CO2 29 32 27  GLUCOSE 79 99 95  BUN 9 8 6   CREATININE 0.61 0.64 0.59*  CALCIUM 8.9 8.3* 8.3*   LFT Recent Labs    08/23/18 0555  PROT 5.5*  ALBUMIN 2.1*  AST 78*  ALT 44  ALKPHOS 128*  BILITOT 5.1*   PT/INR Recent Labs    08/23/18 0555  LABPROT 27.4*  INR 2.59      Principal Problem:   Hepatic encephalopathy (HCC) Active Problems:   Portal hypertension with esophageal varices (HCC)   Thrombocytopenia (HCC)   Alcoholic cirrhosis of liver with ascites (HCC)   Depression   Hypokalemia   Hypomagnesemia  Malnutrition of moderate degree   Accelerated junctional rhythm   Acute liver failure with hepatic coma (HCC)     LOS: 7 days   Willette Cluster ,NP 08/23/2018, 9:10 AM

## 2018-08-24 LAB — CBC WITH DIFFERENTIAL/PLATELET
Abs Immature Granulocytes: 0.04 10*3/uL (ref 0.00–0.07)
BASOS ABS: 0.1 10*3/uL (ref 0.0–0.1)
BASOS PCT: 1 %
EOS ABS: 0.2 10*3/uL (ref 0.0–0.5)
EOS PCT: 4 %
HCT: 37.9 % — ABNORMAL LOW (ref 39.0–52.0)
Hemoglobin: 12.7 g/dL — ABNORMAL LOW (ref 13.0–17.0)
Immature Granulocytes: 1 %
Lymphocytes Relative: 14 %
Lymphs Abs: 0.7 10*3/uL (ref 0.7–4.0)
MCH: 31.8 pg (ref 26.0–34.0)
MCHC: 33.5 g/dL (ref 30.0–36.0)
MCV: 94.8 fL (ref 80.0–100.0)
Monocytes Absolute: 1.1 10*3/uL — ABNORMAL HIGH (ref 0.1–1.0)
Monocytes Relative: 21 %
NRBC: 0 % (ref 0.0–0.2)
Neutro Abs: 3.2 10*3/uL (ref 1.7–7.7)
Neutrophils Relative %: 59 %
Platelets: 67 10*3/uL — ABNORMAL LOW (ref 150–400)
RBC: 4 MIL/uL — AB (ref 4.22–5.81)
RDW: 16.9 % — AB (ref 11.5–15.5)
WBC: 5.3 10*3/uL (ref 4.0–10.5)

## 2018-08-24 LAB — COMPREHENSIVE METABOLIC PANEL
ALBUMIN: 2.3 g/dL — AB (ref 3.5–5.0)
ALT: 48 U/L — ABNORMAL HIGH (ref 0–44)
ANION GAP: 7 (ref 5–15)
AST: 86 U/L — ABNORMAL HIGH (ref 15–41)
Alkaline Phosphatase: 135 U/L — ABNORMAL HIGH (ref 38–126)
BUN: 5 mg/dL — ABNORMAL LOW (ref 6–20)
CO2: 26 mmol/L (ref 22–32)
Calcium: 8.2 mg/dL — ABNORMAL LOW (ref 8.9–10.3)
Chloride: 106 mmol/L (ref 98–111)
Creatinine, Ser: 0.37 mg/dL — ABNORMAL LOW (ref 0.61–1.24)
GFR calc Af Amer: >60 mL/min (ref >60)
GFR calc non Af Amer: >60 mL/min (ref >60)
GLUCOSE: 90 mg/dL (ref 70–99)
POTASSIUM: 3.9 mmol/L (ref 3.5–5.1)
SODIUM: 139 mmol/L (ref 135–145)
TOTAL PROTEIN: 5.6 g/dL — AB (ref 6.5–8.1)
Total Bilirubin: 5.5 mg/dL — ABNORMAL HIGH (ref 0.3–1.2)

## 2018-08-24 LAB — MAGNESIUM: Magnesium: 1.7 mg/dL (ref 1.7–2.4)

## 2018-08-24 LAB — PHOSPHORUS: PHOSPHORUS: 4 mg/dL (ref 2.5–4.6)

## 2018-08-24 LAB — PATHOLOGIST SMEAR REVIEW

## 2018-08-24 MED ORDER — MAGNESIUM SULFATE 2 GM/50ML IV SOLN
2.0000 g | Freq: Once | INTRAVENOUS | Status: AC
  Administered 2018-08-24: 2 g via INTRAVENOUS
  Filled 2018-08-24: qty 50

## 2018-08-24 NOTE — Progress Notes (Signed)
PROGRESS NOTE    Kenneth [name]  WUJ:[id number] DOB: 1960-12-27 DOA: 08/15/2018 PCP: [name], MD   Brief Narrative:  Kenneth [name] a 57 y.o.malewithhistory of hepatic cirrhosis status post TIPS procedure with history of esophageal varices who has had recent paracentesis done 2 weeks ago was brought to the ER after patient's family found patient was increasingly confused. Patient also states that over thepast 4 days patient felt that his abdomen is getting more distendedand he has has episodes of nausea and vomiting.   In ED >Ammonia 79, K 3.1, oriented to self and place, abdomen distended- given IVF and K.   S/p IR guided paracentesis on 10/16, ~ 4 L removed.  PT evaluated and recommending SNF and social work involved for assistance with placement.   Patient continued to remain severely confused gastroenterology was consulted for further evaluation recommendations and placed the patient on Xifaxan.  This morning patient is much more alert.  Patient underwent diagnostic paracentesis and yielded 750 mL   Assessment & Plan:   Principal Problem:   Hepatic encephalopathy (HCC) Active Problems:   Portal hypertension with esophageal varices (HCC)   Thrombocytopenia (HCC)   Alcoholic cirrhosis of liver with ascites (HCC)   Depression   Hypokalemia   Hypomagnesemia   Malnutrition of moderate degree   Accelerated junctional rhythm   Acute liver failure with hepatic coma (HCC)  Hepatic Encephalopathy  Hyperammonemia  -Cont Lactulose- Remains intermittently confused  -Ammonia Level went from 79 -> 102 -> 87 -> 90 -> 87 -> 147 -> 53 -Increased Lactulose 45 g p.o. 3 times daily and will continue -Started the patient on lactulose enemas and have ordered 300 mL rectal yesterday and increased to BID but have now stopped -I have consulted Gastroenterology for further evaluation and assistance. -Gastroenterology recommending starting Xifaxan 550 mill grams p.o. twice  daily -Gastroenterology recommended obtaining a diagnostic and therapeutic Paracentesis to rule out SBP and 750 mL of yellow fluid removed and a portion of the fluid was submitted to lab for preordered studies by Gastroenterology; Does not Appear to SBP -Ascitic Fluid sent for Cx and is showed NG <24 hours -GI recommending continuing current dose of diuretics and monitoring and repleting electrolytes -I doubt this is infectious as UA was unremarkable however I have ordered a urine culture (<10,000 CFU of Insignificant Growth) and Blood cultures (NGTD at 2 Days) but the patient is afebrile and has no white count -Per GI patient did not show up for his liver transplant appointment however they are skeptical about his candidacy for liver transplant at this point now -Continue to Monitor and per GI will not repeat Ammonia Levels  -Start Delirium Precautions  -Appreciate GI evaluation further assistance -Palliative Care consulted for further assistance and GOC; treatment with the patient and his sister as well as his son and current plans to continue interventions for another 48 hours and if he further declines and would be agreeable to hospice discussion however patient is significantly improved but prognosis is still very poor.  Appreciate Dr. Onnie Boer evaluation recommendations.  [name] -Gastroenterology recommending coordinating of the hepatology consultation again in office with in 1 to 2 weeks with will be our GI to schedule another paracentesis -Gastroenterology recommended continue current diuretics along with Midodrine and lactulose.  Oncology also recommending continuing with rifaximin 550 mg twice daily if patient is able to afford it as an outpatient and  not prohibitively expensive -Social work consulted for placement and has a bed offer at [facility] and they are willing to accept the patient on a 30-day  length of stay guarantee and the caseworker assistant director is agreeable to a 30-day LOG along his Medicaid application is complete and pending with ID and this is needed to be done by patient's son  Hypokalemia Hypomagnesemia -Was due t vomiting and diuretics -Replacingvia IV and orally -K+ is3.9 today -Mg+ was1.7 this AM but will give IV Mag Sulfate 2 grams again -Try to achieve K+ > 4.0 and Mg+ > 2.0 -Continue to monitor and replete as necessary -Repeat CMP in the a.m. along with mag level  Accelerated Junctional rhythm -Corrected electrolyte abnormalities- HR is still junctional in 60s -Cave asked for Cardiology eval- Cardiology does not feel any further work up is needed as he is asymptomatic and Dr. Butler Denmark discussed with Dr Delton See  - BP stable in 90- 100s- cont Midodrine for this  Nausea and Vomiting, improved  -Continue with Antiemetics with po/IV Zofran -Patient has Phenergan 6.25 mg IV q6hPRN for Breakthrough Nausea and Vomiting -GI Consulted for Hepatic Encephalopathy and following -Appreciate any further reccs for N/V  Chronic Hypotension -Continue Midodrine 5 mg p.o. 3 times daily with meals  Alcoholic cirrhosis of liver with ascites  -S/p paracentesis as mentioned above in HPI -Abdomen is non-tender and not severely distended -No need for another paracentesis- stop Ceftriaxone -Resumed oral diuretics with Spironolactone 100 mg po Daily -C/w 1200 fluid restriction added -Resumed Home Lasix at 2/3 of the Home dose and started 40 mg po Daily and will continue for now -No longer drinks alcohol -Palliative care consulted for goals of care discussion appreciate palliative evaluation -GI Consulted for further evaluation and recommendations -Gastroenterology started the patient on Xifaxan 550 mg p.o. twice daily and will continue -GI recommending coordinating on [name] pathology consultation again for liver transplant and following up with lumbar GI in 2 weeks and  having a scheduled paracentesis in a week or 2 -GI recommending continue current regimen at this time  Advanced Cirrhosis Portal hypertension with esophageal varices Splenomegaly Elevated INR  (2.57) Hyperbilirubinemia (T Bili is 5.5) Abnormal LFTs  -s/p TIPS -As above -GI now following; patient was supposed to follow-up with the liver transplant specialist and had a liver transplant appointment however did not show for it and is now awaiting other referral [facility] but continues to decline. -GI ordered Diagnostic and Therapeutic Paracentesis and yielded 750 mL of yellow fluid negative for SBP -Per GI recommending paracentesis every 1 to 2 weeks  Thrombocytopenia  -Chronicand stable -likely related to cirrhosis splenomegaly -Platelet Count now 67,000  Depression/Anxiety -Welbutrin dose has been changed from 150 > 100 mg daily due to poor liver function -Changed with Fluoxetine 40 mg p.o. Daily to 20 mg po Daily, and Gabapentin 300 mg p.o. nightly -? Wellbutrin causing more tremors however they have been improving   Normocytic Anemia -Since hemoglobin/hematocrit is stable at 12.7/37.9 -In the setting of liver cirrhosis -Continue monitor for signs and symptoms of bleeding -Repeat CBC in the a.m.  Non-severe/Moderate malnutrition in the context of chronic illness -Nutrition is consulted for further evaluation recommendations -Nutrition is recommending Ensure Enlive p.o. twice daily as well as encouraging p.o. intakes  DVT prophylaxis: SCDs given thrombocytopenia Code Status: FULL CODE Family Communication: No family present at bedside  Disposition Plan: Likely SNF at [facility] now as patient is unsafe discharge plan to go home  when he is medically stable and social issues are worked out with obtaining bleeding Medicaid application with pending ID  Consultants:   Cardiology  Palliative care medicine  Gastroenterology   Procedures:  Paracentesis  x2   Antimicrobials:  Anti-infectives (From admission, onward)   Start     Dose/Rate Route Frequency Ordered Stop   08/22/18 1500  rifaximin (XIFAXAN) tablet 550 mg     550 mg Oral 2 times daily 08/22/18 1421     08/16/18 0115  cefTRIAXone (ROCEPHIN) 2 g in sodium chloride 0.9 % 100 mL IVPB  Status:  Discontinued     2 g 200 mL/hr over 30 Minutes Intravenous Daily at bedtime 08/16/18 0058 08/16/18 0906     Subjective: Seen and examined at bedside and was more alert and pleasant however he is wanting to leave to go visit his mother.  No chest pain, lightheadedness or dizziness.  Not as alert as he was yesterday and had a difficult time remembering certain things.  No other concerns or complaints at this time but mentation has improved the last few days.  GI recommending continuing current regimen and follow-up as outpatient.  Overnight patient rescinded his DNR and is now full code again.  Objective: Vitals:   08/23/18 1407 08/23/18 2040 08/24/18 0408 08/24/18 1300  BP: 103/74 110/75 108/81 115/79  Pulse: 75 75 78 71  Resp: 12 18 18 16   Temp: 97.8 F (36.6 C) 98.2 F (36.8 C) 98.6 F (37 C) 98.2 F (36.8 C)  TempSrc: Oral Oral Oral Oral  SpO2: 99% 99% 98% 99%  Weight:      Height:        Intake/Output Summary (Last 24 hours) at 08/24/2018 1618 Last data filed at 08/24/2018 0900 Gross per 24 hour  Intake 960 ml  Output 1400 ml  Net -440 ml   Filed Weights   08/15/18 2352  Weight: 61.4 kg   Examination: Physical Exam:  Constitutional: Frail, cachectic, chronically ill-appearing Caucasian male currently no acute distress and is not confused today but is a little forgetful Eyes: Sclera is anicteric.  Lids and conjunctive are normal ENMT: External ears and nose appear normal.  Grossly normal hearing.  Mucous members are moist Neck: Appears supple no JVD Respiratory: Diminished to auscultation bilaterally no appreciable wheezing, rales, rhonchi.  Patient not tachypneic  wheezing excess muscle breathe Cardiovascular: Regular rate and rhythm.  No appreciable murmurs, rubs or gallops Abdomen: Soft, slightly tender.  Umbilical hernia noted.  Bowel sounds present all 4 quadrants. GU: Deferred Musculoskeletal: No contractures cyanosis.  No joint deformity noted Skin: Is warm and dry.  No appreciable rashes or lesions on limited skin evaluation but he still jaundiced Neurologic: Cranial nerves II through XII grossly intact no appreciable focal deficits. Psychiatric: Normal judgment and insight.  Patient is awake, alert and oriented.  Somewhat forgetful and did not remember who his GI doctor was today but does remember other things he is oriented  Data Reviewed: I have personally reviewed following labs and imaging studies  CBC: Recent Labs  Lab 08/20/18 0526 08/21/18 0506 08/22/18 0537 08/23/18 0555 08/24/18 0555  WBC 7.0 5.9 5.8 5.7 5.3  NEUTROABS 4.7 3.2 3.4 3.3 3.2  HGB 12.8* 12.5* 12.4* 12.3* 12.7*  HCT 39.5 38.5* 37.5* 37.5* 37.9*  MCV 96.6 96.7 95.4 95.7 94.8  PLT 84* 78* 74* 65* 67*   Basic Metabolic Panel: Recent Labs  Lab 08/20/18 0526 08/21/18 0506 08/22/18 0537 08/23/18 0555 08/24/18 0555  NA 142 143  140 138 139  K 3.6 4.1 3.1* 4.0 3.9  CL 103 106 102 105 106  CO2 30 29 32 27 26  GLUCOSE 87 79 99 95 90  BUN 8 9 8 6  5*  CREATININE 0.57* 0.61 0.64 0.59* 0.37*  CALCIUM 8.9 8.9 8.3* 8.3* 8.2*  MG 1.9 1.7 1.8 1.6* 1.7  PHOS 3.9 3.7 2.7 2.4* 4.0   GFR: Estimated Creatinine Clearance: 88.5 mL/min (A) (by C-G formula based on SCr of 0.37 mg/dL (L)). Liver Function Tests: Recent Labs  Lab 08/20/18 0526 08/21/18 0506 08/22/18 0537 08/23/18 0555 08/24/18 0555  AST 72* 74* 74* 78* 86*  ALT 36 37 40 44 48*  ALKPHOS 146* 140* 140* 128* 135*  BILITOT 5.7* 5.7* 5.1* 5.1* 5.5*  PROT 5.8* 5.7* 5.7* 5.5* 5.6*  ALBUMIN 2.5* 2.4* 2.3* 2.1* 2.3*   No results for input(s): LIPASE, AMYLASE in the last 168 hours. Recent Labs  Lab  08/19/18 0831 08/20/18 0830 08/21/18 0928 08/22/18 0847 08/23/18 0555  AMMONIA 87* 90* 87* 147* 53*   Coagulation Profile: Recent Labs  Lab 08/23/18 0555  INR 2.59   Cardiac Enzymes: No results for input(s): CKTOTAL, CKMB, CKMBINDEX, TROPONINI in the last 168 hours. BNP (last 3 results) No results for input(s): PROBNP in the last 8760 hours. HbA1C: No results for input(s): HGBA1C in the last 72 hours. CBG: Recent Labs  Lab 08/17/18 2146  GLUCAP 98   Lipid Profile: No results for input(s): CHOL, HDL, LDLCALC, TRIG, CHOLHDL, LDLDIRECT in the last 72 hours. Thyroid Function Tests: No results for input(s): TSH, T4TOTAL, FREET4, T3FREE, THYROIDAB in the last 72 hours. Anemia Panel: No results for input(s): VITAMINB12, FOLATE, FERRITIN, TIBC, IRON, RETICCTPCT in the last 72 hours. Sepsis Labs: No results for input(s): PROCALCITON, LATICACIDVEN in the last 168 hours.  Recent Results (from the past 240 hour(s))  Culture, Urine     Status: Abnormal   Collection Time: 08/22/18 10:26 AM  Result Value Ref Range Status   Specimen Description   Final    URINE, CLEAN CATCH Performed at [facility], [address], [place], [place] [address]    Special Requests   Final    NONE Performed at [facility], [address], [place], [place] [address]    Culture (A)  Final    <10,000 COLONIES/mL INSIGNIFICANT GROWTH Performed at [facility], [address] [place], [place] [address]    Report Status 08/23/2018 FINAL  Final  Culture, blood (routine x 2)     Status: None (Preliminary result)   Collection Time: 08/22/18 11:02 AM  Result Value Ref Range Status   Specimen Description   Final    BLOOD RIGHT ARM Performed at [facility], [address] [place], [place] [address]    Special Requests   Final    BAA BCAV Performed at [facility], [address] [place], [place] [address]    Culture    Final    NO GROWTH 2 DAYS Performed at [facility], [address], [place], [place] [address]    Report Status PENDING  Incomplete  Culture, blood (routine x 2)     Status: None (Preliminary result)   Collection Time: 08/22/18 11:02 AM  Result Value Ref Range Status   Specimen Description   Final    BLOOD LEFT ARM Performed at [facility], [address] [place], [place] [address]    Special Requests  Final    BAA BCAV Performed at [facility], [address] [place], [place] [address]    Culture   Final    NO GROWTH 2 DAYS Performed at [facility], [address] [place], [place] [address]    Report Status PENDING  Incomplete  Body fluid culture     Status: None (Preliminary result)   Collection Time: 08/23/18 12:55 PM  Result Value Ref Range Status   Specimen Description   Final    Peritoneal Performed at [facility], [address], [place], [place] [address]    Special Requests   Final    NONE Performed at [facility], [address], [place], [place] [address]    Gram Stain NO WBC SEEN NO ORGANISMS SEEN   Final   Culture   Final    NO GROWTH < 24 HOURS Performed at [facility], [address], [place], [place] [address]    Report Status PENDING  Incomplete    Radiology Studies: Ir Paracentesis  Result Date: 08/23/2018 INDICATION: Patient with history of end-stage liver disease/cirrhosis with prior TIPS, recurrent ascites; request made for diagnostic and therapeutic paracentesis. EXAM: ULTRASOUND GUIDED DIAGNOSTIC AND THERAPEUTIC PARACENTESIS MEDICATIONS: None COMPLICATIONS: None immediate. PROCEDURE: Informed written consent was obtained from the patient after a discussion of the risks, benefits and alternatives to treatment. A timeout was performed prior to the initiation of the procedure. Initial ultrasound scanning demonstrates a small amount of ascites  within the left lower abdominal quadrant. The left lower abdomen was prepped and draped in the usual sterile fashion. 1% lidocaine was used for local anesthesia. Following this, a 19 gauge, 7-cm, Yueh catheter was introduced. An ultrasound image was saved for documentation purposes. The paracentesis was performed. The catheter was removed and a dressing was applied. The patient tolerated the procedure well without immediate post procedural complication. FINDINGS: A total of approximately 750 cc of yellow fluid was removed. Samples were sent to the laboratory as requested by the clinical team. IMPRESSION: Successful ultrasound-guided diagnostic and therapeutic paracentesis yielding 750 cc of peritoneal fluid. Read by: Jeananne Rama, PA-C Electronically Signed   By: Malachy Moan M.D.   On: 08/23/2018 13:01   Scheduled Meds: . buPROPion  100 mg Oral Daily  . feeding supplement (ENSURE ENLIVE)  237 mL Oral BID BM  . FLUoxetine  20 mg Oral Daily  . furosemide  40 mg Oral Daily  . gabapentin  300 mg Oral QHS  . lactulose  45 g Oral TID  . midodrine  5 mg Oral TID WC  . multivitamin with minerals  1 tablet Oral Daily  . nicotine  21 mg Transdermal Daily  . rifaximin  550 mg Oral BID  . spironolactone  100 mg Oral Daily   Continuous Infusions: . sodium chloride Stopped (08/23/18 1018)    LOS: 8 days   Merlene Laughter, DO [facility] PAGER is on AMION  If 7PM-7AM, please contact night-coverage [email or web address] Password [email or web address] 08/24/2018, 4:18 PM

## 2018-08-24 NOTE — Progress Notes (Signed)
CSW spoke with patient's son, Adolf Ormiston ([phone]) to f/u regarding status of long term Medicaid application. Son reports he has gone to DSS office, has had patient sign paperwork that was given to him, and is planning to return to continue process. CSW encouraged son to complete application as soon as possible as Medicaid pending ID is needed to pursue placement with LOG.  [facility] SN[facility] is able to accept patient on 30 day LOG. CSW Chiropodist agreeable to 30 day LOG as long as Medicaid application is complete with pending ID.  CSW will continue to follow for discharge coordination.   Enid Cutter, MSW, [employer] Clinical Social Work [phone] (Weekend)

## 2018-08-24 NOTE — Progress Notes (Signed)
[facility] Progress Note  CC:  Cirrhosis and HE  Subjective:  Feels good.  Tolerating diet.  No abdominal pain.    Objective:  Vital signs in last 24 hours: Temp:  [97.8 F (36.6 C)-98.6 F (37 C)] 98.6 F (37 C) (10/29 0408) Pulse Rate:  [74-78] 78 (10/29 0408) Resp:  [12-18] 18 (10/29 0408) BP: (103-110)/(71-81) 108/81 (10/29 0408) SpO2:  [98 %-99 %] 98 % (10/29 0408) Last BM Date: 08/22/18 General:  Chronically ill-appearing, in NAD Heart:  Regular rate and rhythm; no murmurs Pulm:  CTAB.  No increased WOB. Abdomen:  Soft, slightly distended.  BS present.  Non-tender.  Extremities:  Without edema. Neurologic:  Alert and oriented x4;  grossly normal neurologically.  Negative for asterixis.  Intake/Output from previous day: 10/28 0701 - 10/29 0700 In: 1331.1 [P.O.:1280; I.V.:1.1; IV Piggyback:50] Out: 1400 [Urine:1400]  Lab Results: Recent Labs    08/22/18 0537 08/23/18 0555 08/24/18 0555  WBC 5.8 5.7 5.3  HGB 12.4* 12.3* 12.7*  HCT 37.5* 37.5* 37.9*  PLT 74* 65* 67*   BMET Recent Labs    08/22/18 0537 08/23/18 0555 08/24/18 0555  NA 140 138 139  K 3.1* 4.0 3.9  CL 102 105 106  CO2 32 27 26  GLUCOSE 99 95 90  BUN 8 6 5*  CREATININE 0.64 0.59* 0.37*  CALCIUM 8.3* 8.3* 8.2*   LFT Recent Labs    08/24/18 0555  PROT 5.6*  ALBUMIN 2.3*  AST 86*  ALT 48*  ALKPHOS 135*  BILITOT 5.5*   PT/INR Recent Labs    08/23/18 0555  LABPROT 27.4*  INR 2.59   Ir Paracentesis  Result Date: 08/23/2018 INDICATION: Patient with history of end-stage liver disease/cirrhosis with prior TIPS, recurrent ascites; request made for diagnostic and therapeutic paracentesis. EXAM: ULTRASOUND GUIDED DIAGNOSTIC AND THERAPEUTIC PARACENTESIS MEDICATIONS: None COMPLICATIONS: None immediate. PROCEDURE: Informed written consent was obtained from the patient after a discussion of the risks, benefits and alternatives to treatment. A timeout was performed prior  to the initiation of the procedure. Initial ultrasound scanning demonstrates a small amount of ascites within the left lower abdominal quadrant. The left lower abdomen was prepped and draped in the usual sterile fashion. 1% lidocaine was used for local anesthesia. Following this, a 19 gauge, 7-cm, Yueh catheter was introduced. An ultrasound image was saved for documentation purposes. The paracentesis was performed. The catheter was removed and a dressing was applied. The patient tolerated the procedure well without immediate post procedural complication. FINDINGS: A total of approximately 750 cc of yellow fluid was removed. Samples were sent to the laboratory as requested by the clinical team. IMPRESSION: Successful ultrasound-guided diagnostic and therapeutic paracentesis yielding 750 cc of peritoneal fluid. Read by: Jeananne Rama, PA-C Electronically Signed   By: Malachy Moan M.D.   On: 08/23/2018 13:01   Assessment / Plan: #11. 57 year old male with ESLDlikely from ETOH requiring frequent paracentesis.MELD 20. Did well for a while following TIPS procedure late [place] now with recurrent ascites requiring frequent LVPs. In the hospital several days nowfor N/V and confusion(probably HE). -He is alert and conversant today, major improvement.  Asterixis negative today.  -No evidence for infectious process. CXR neg. U/A not suspicious.  -no evidence for GI bleed  -Up to date with [facility] surveillance. No definite liver lesions on CT scan followed by MRI in July -Ammonia does not usually correlate with degree ofHEbut was 147 today, down to 53 yesterday.  Continue lactulose 45 grams TID  and Xifaxan 550 mg BID for now.  Goal with lactulose should be 3-4 soft stools daily. -Palliative Medicine following. Plan was for Hospice in a couple of days in no improvement but there has short some improvement in HE overnight. Unfortunately the overall picture is still grim.  Was actually DNR but now son and patient  want "everything done" so he is FULL CODE. -Patient didn't show for two Liver Transplant appts. He is now awaiting another referral to [facility] but in the interim continues to decline. Skeptical about candidacy for transplant at this point. Also, it appears he has limited social support. Still in hospital because no one at home to care for him at home.  Plan is for discharge to [facility] living facility ? tomorrow.   LOS: 8 days   Princella Pellegrini. Zehr  08/24/2018, 9:09 AM

## 2018-08-25 ENCOUNTER — Telehealth: Payer: Self-pay

## 2018-08-25 LAB — CBC WITH DIFFERENTIAL/PLATELET
Abs Immature Granulocytes: 0.05 10*3/uL (ref 0.00–0.07)
BASOS ABS: 0.1 10*3/uL (ref 0.0–0.1)
Basophils Relative: 1 %
EOS PCT: 3 %
Eosinophils Absolute: 0.2 10*3/uL (ref 0.0–0.5)
HEMATOCRIT: 38 % — AB (ref 39.0–52.0)
HEMOGLOBIN: 12.7 g/dL — AB (ref 13.0–17.0)
Immature Granulocytes: 1 %
LYMPHS ABS: 1 10*3/uL (ref 0.7–4.0)
LYMPHS PCT: 14 %
MCH: 31.8 pg (ref 26.0–34.0)
MCHC: 33.4 g/dL (ref 30.0–36.0)
MCV: 95.2 fL (ref 80.0–100.0)
MONO ABS: 1.4 10*3/uL — AB (ref 0.1–1.0)
MONOS PCT: 19 %
Neutro Abs: 4.7 10*3/uL (ref 1.7–7.7)
Neutrophils Relative %: 62 %
Platelets: 76 10*3/uL — ABNORMAL LOW (ref 150–400)
RBC: 3.99 MIL/uL — ABNORMAL LOW (ref 4.22–5.81)
RDW: 17.2 % — ABNORMAL HIGH (ref 11.5–15.5)
WBC: 7.4 10*3/uL (ref 4.0–10.5)
nRBC: 0 % (ref 0.0–0.2)

## 2018-08-25 LAB — COMPREHENSIVE METABOLIC PANEL
ALK PHOS: 143 U/L — AB (ref 38–126)
ALT: 51 U/L — AB (ref 0–44)
AST: 94 U/L — AB (ref 15–41)
Albumin: 2.4 g/dL — ABNORMAL LOW (ref 3.5–5.0)
Anion gap: 4 — ABNORMAL LOW (ref 5–15)
BILIRUBIN TOTAL: 5.9 mg/dL — AB (ref 0.3–1.2)
BUN: 7 mg/dL (ref 6–20)
CO2: 30 mmol/L (ref 22–32)
CREATININE: 0.65 mg/dL (ref 0.61–1.24)
Calcium: 8.5 mg/dL — ABNORMAL LOW (ref 8.9–10.3)
Chloride: 102 mmol/L (ref 98–111)
GFR calc Af Amer: >60 mL/min (ref >60)
Glucose, Bld: 97 mg/dL (ref 70–99)
Potassium: 4 mmol/L (ref 3.5–5.1)
Sodium: 136 mmol/L (ref 135–145)
TOTAL PROTEIN: 5.8 g/dL — AB (ref 6.5–8.1)

## 2018-08-25 LAB — PHOSPHORUS: Phosphorus: 3.8 mg/dL (ref 2.5–4.6)

## 2018-08-25 LAB — MAGNESIUM: MAGNESIUM: 1.7 mg/dL (ref 1.7–2.4)

## 2018-08-25 MED ORDER — MAGNESIUM SULFATE 4 GM/100ML IV SOLN
4.0000 g | Freq: Once | INTRAVENOUS | Status: AC
  Administered 2018-08-25: 4 g via INTRAVENOUS
  Filled 2018-08-25: qty 100

## 2018-08-25 NOTE — Telephone Encounter (Signed)
Spoke to patient and let him know to check his MyChart messages, that I did inquire about setting up an appointment with [facility] liver clinic. They requesting he contact them and they will schedule appointment.

## 2018-08-25 NOTE — Progress Notes (Signed)
Physical Therapy Treatment Patient Details Name: Kenneth [name] MRN: [id number] DOB: [birth year] Today's Date: 08/25/2018    History of Present Illness 57 yo male admitted with hepatic encephalopathy, ascites. Hx of hepatic cirrhosis, polysubstance abuse    PT Comments    Pt tolerated increased distance of 160' with ambulation today. He is oriented, but requires some verbal cues due to decreased safety awareness.    Follow Up Recommendations  SNF;Supervision for mobility/OOB     Equipment Recommendations  None recommended by PT    Recommendations for Other Services       Precautions / Restrictions Precautions Precautions: Fall Restrictions Weight Bearing Restrictions: No    Mobility  Bed Mobility Overal bed mobility: Needs Assistance Bed Mobility: Supine to Sit;Sit to Supine     Supine to sit: Supervision Sit to supine: Supervision   General bed mobility comments: no physical assist needed, supervision for safety, VCs for technique  Transfers Overall transfer level: Needs assistance Equipment used: Rolling walker (2 wheeled) Transfers: Sit to/from Stand Sit to Stand: Min guard         General transfer comment: VCs hand placement, min/guard safety  Ambulation/Gait Ambulation/Gait assistance: Min guard Gait Distance (Feet): 160 Feet Assistive device: Rolling walker (2 wheeled) Gait Pattern/deviations: Step-through pattern;Decreased stride length Gait velocity: WFL   General Gait Details: steady with RW, no loss of balance, distance limited by fatigue   Stairs             Wheelchair Mobility    Modified Rankin (Stroke Patients Only)       Balance Overall balance assessment: Needs assistance   Sitting balance-Leahy Scale: Good     Standing balance support: Bilateral upper extremity supported Standing balance-Leahy Scale: Fair Standing balance comment: at risk for falls                            Cognition  Arousal/Alertness: Awake/alert Behavior During Therapy: Flat affect Overall Cognitive Status: No family/caregiver present to determine baseline cognitive functioning Area of Impairment: Safety/judgement;Problem solving                               General Comments: oriented to self, location, and month/year; able to follow 1 step commands; was unaware that catheter tube was twisting around him when he turned to bed after walking      Exercises      General Comments        Pertinent Vitals/Pain Pain Assessment: No/denies pain    Home Living                      Prior Function            PT Goals (current goals can now be found in the care plan section) Acute Rehab PT Goals Patient Stated Goal: to go visit his mother PT Goal Formulation: With patient Time For Goal Achievement: 09/02/18 Potential to Achieve Goals: Good Progress towards PT goals: Progressing toward goals    Frequency    Min 2X/week      PT Plan Current plan remains appropriate    Co-evaluation              AM-PAC PT "6 Clicks" Daily Activity  Outcome Measure  Difficulty turning over in bed (including adjusting bedclothes, sheets and blankets)?: None Difficulty moving from lying on back to sitting on the side of the  bed? : A Little Difficulty sitting down on and standing up from a chair with arms (e.g., wheelchair, bedside commode, etc,.)?: A Little Help needed moving to and from a bed to chair (including a wheelchair)?: A Little Help needed walking in hospital room?: A Little Help needed climbing 3-5 steps with a railing? : A Lot 6 Click Score: 18    End of Session Equipment Utilized During Treatment: Gait belt Activity Tolerance: Patient tolerated treatment well(limited by nausea) Patient left: in bed;with call bell/phone within reach;with bed alarm set Nurse Communication: Mobility status PT Visit Diagnosis: Muscle weakness (generalized) (M62.81);Difficulty in  walking, not elsewhere classified (R26.2)     Time: 2952-8413 PT Time Calculation (min) (ACUTE ONLY): 13 min  Charges:  $Gait Training: 8-22 mins                     Ralene Bathe Kistler PT 08/25/2018  Acute Rehabilitation Services Pager [phone] Office [phone]

## 2018-08-25 NOTE — Progress Notes (Signed)
OT Cancellation Note  Patient Details Name: Kenneth [name] MRN: [id number] DOB: [birth year]   Cancelled Treatment:    Reason Eval/Treat Not Completed: Other (comment) Pt sitting up in bed eating meal upon OT arrival, requesting OT return at later time. Will continue to follow next date as pt is available and appropriate to initiate OT POC.  Dalphine Handing, MSOT, OTR/L Behavioral Health OT/ Acute Relief OT  [facility] Office: [phone]  Dalphine Handing 08/25/2018, 5:19 PM

## 2018-08-25 NOTE — Progress Notes (Signed)
PROGRESS NOTE    Kenneth [name]  ZOX:[id number] DOB: 1961/05/20 DOA: 08/15/2018 PCP: [name], MD   Brief Narrative:  Kenneth [name] a 57 y.o.malewithhistory of hepatic cirrhosis status post TIPS procedure with history of esophageal varices who has had recent paracentesis done 2 weeks ago was brought to the ER after patient's family found patient was increasingly confused. Patient also states that over thepast 4 days patient felt that his abdomen is getting more distendedand he has has episodes of nausea and vomiting.   In ED >Ammonia 79, K 3.1, oriented to self and place, abdomen distended- given IVF and K.   S/p IR guided paracentesis on 10/16, ~ 4 L removed.  PT evaluated and recommending SNF and social work involved for assistance with placement.   Patient continued to remain severely confused gastroenterology was consulted for further evaluation recommendations and placed the patient on Xifaxan.  This morning patient is much more alert.  Patient underwent diagnostic paracentesis and yielded 750 mL    Assessment & Plan:   Principal Problem:   Hepatic encephalopathy (HCC) Active Problems:   Portal hypertension with esophageal varices (HCC)   Thrombocytopenia (HCC)   Alcoholic cirrhosis of liver with ascites (HCC)   Depression   Hypokalemia   Hypomagnesemia   Malnutrition of moderate degree   Accelerated junctional rhythm   Acute liver failure with hepatic coma (HCC)   Hepatic Encephalopathy  Hyperammonemia  -Some improvement.  Continue lactulose and Xifaxan. -Ammonia Level went from 79 -> 102 -> 87 -> 90 -> 87 -> 147 -> 53 -Increased Lactulose 45 g p.o. 3 times daily and will continue -Patient was initially on lactulose enemas which have since been discontinued.  -Gastroenterology was consulted and followed the patient during the hospitalization.  -Gastroenterology recommended starting Xifaxan 550 mill grams p.o. twice daily -Gastroenterology  recommended obtaining a diagnostic and therapeutic Paracentesis to rule out SBP and 750 mL of yellow fluid removed and a portion of the fluid was submitted to lab for preordered studies by Gastroenterology; paracentesis negative for SBP.  -Ascitic Fluid sent for Cx and is showed NG <24 hours -GI recommending continuing current dose of diuretics and monitoring and repleting electrolytes -I doubt this is infectious as UA was unremarkable however urine culture (<10,000 CFU of Insignificant Growth) and Blood cultures (NGTD at 3 Days) but the patient is afebrile and has no white count -Per GI patient did not show up for his liver transplant appointment however they are skeptical about his candidacy for liver transplant at this point now -Continue to Monitor and per GI will not repeat Ammonia Levels  -Appreciate GI evaluation further assistance -Palliative Care consulted for further assistance and GOC; treatment with the patient and his sister as well as his son and current plans to continue interventions for another 48 hours and if he further declines and would be agreeable to hospice discussion however patient is significantly improved but prognosis is still very poor.  Appreciate Dr. Onnie Boer evaluation recommendations.  [name] -Gastroenterology recommending coordinating of the hepatology consultation again in office with in 1 to 2 weeks with will be our GI to schedule another paracentesis -Gastroenterology recommended continue current diuretics along with Midodrine and lactulose.  Oncology also recommending continuing with rifaximin 550 mg twice daily if patient is able to afford it as an outpatient and not prohibitively expensive -Social work consulted for placement and has a bed  offer at [facility] and they are willing to accept the patient on a 30-day length of stay guarantee and the caseworker assistant director  is agreeable to a 30-day LOG along his Medicaid application is complete and pending with ID and this is needed to be done by patient's son  Hypokalemia Hypomagnesemia -Secondary to GI losses from vomiting as well as from diuretics. -Potassium repleted.  Potassium currently at 4.  Magnesium 1.7.  We will give a dose of magnesium sulfate 4 g IV x1 to keep Magrinat and 2.  Try to keep potassium greater than 4.  Repeat labs in the morning.    AcceleratedJunctional rhythm -Correctedelectrolyte abnormalities- HR is still junctional in 60s -Patient was seen in consultation by cardiology.  Cardiology does not feel any further work-up needed at this time.  Patient currently asymptomatic.  Dr. Butler Denmark discussed with Dr Delton See  - BP stable in90-100s- cont Midodrine for this -Per cardiology no further work-up needed at this time.  Nausea and Vomiting, improved  -Continue with Antiemetics with po/IV Zofran -Patient has Phenergan 6.25 mg IV q6hPRN for Breakthrough Nausea and Vomiting -GI Consulted for Hepatic Encephalopathy and following -Appreciate any further reccs for N/V  Chronic Hypotension -Continue current regimen of midodrine 5 mg 3 times daily with meals.  Alcoholic cirrhosis of liver with ascites  -S/p paracentesis as mentioned above in HPI -Abdomen is nontender, nondistended, positive bowel sounds.  -Patient had received a dose of IV Rocephin which has subsequently been discontinued.  No need for paracentesis at this time.  -Continue current regimen of spironolactone 100 mg daily and Lasix 40 mg daily at this time.  Continue fluid restriction 1200 cc/day.  -Patient no longer consumes alcohol.   -Palliative care consulted for goals of care discussion appreciate palliative evaluation -GI Consulted for further evaluation and recommendations -Gastroenterology started the patient on Xifaxan 550 mg p.o. twice daily and will continue for now in addition to lactulose. -GI recommending  coordinating on [name] pathology consultation again for liver transplant and following up with lumbar GI in 2 weeks and having a scheduled paracentesis in a week or 2 -GI recommending continue current regimen at this time  Advanced Cirrhosis Portal hypertension with esophageal varices Splenomegaly Elevated INR  (2.57) Hyperbilirubinemia (T Bili is 5.5) Abnormal LFTs  -s/p TIPS -As above -GI now following; patient was supposed to follow-up with the liver transplant specialist and had a liver transplant appointment however did not show for it and is now awaiting other referral to [facility] but continues to decline. -GI ordered Diagnostic and Therapeutic Paracentesis and yielded 750 mL of yellow fluid negative for SBP -Per GI recommending paracentesis every 1 to 2 weeks  Thrombocytopenia  -Likely secondary to cirrhosis and splenomegaly.  Patient with no overt bleeding.  Stable.  Platelet count at 76.   Depression/Anxiety -Welbutrin dose was changed from 150 mg 200 mg daily due to poor liver function.  -Fluoxetine has been changed from 40 mg to 20 mg daily.  Continue gabapentin 300 mg nightly.  Follow.  -? Wellbutrin causing more tremors however they have been improving   Normocytic Anemia -Hemoglobin stable at 12.7.  -In the setting of liver cirrhosis -Continue monitor for signs and symptoms of bleeding -Repeat CBC in the a.m.  Non-severe/Moderate malnutrition in the context of chronic illness -Nutrition is consulted for further evaluation recommendations -Nutrition is recommending Ensure p.o. twice daily as well as encouraging p.o. intakes   DVT prophylaxis:  Code Status: Full Family Communication:  Updated patient.  No family present. Disposition Plan: Skilled nursing facility when bed available   Consultants:   Cardiology: Dr. Delton See 08/16/2018  Palliative care: Dr. Neale Burly 08/21/2018  Gastroenterology: Dr. Leone Payor 08/22/2018  Procedures:   Acute  abdominal series 08/16/2018  Ultrasound-guided diagnostic and therapeutic paracentesis 08/23/2018 with 750 cc of yellow fluid removed per interventional radiology, Dr. Malachy Moan  Antimicrobials:   None   Subjective: Sitting up in bed.  Denies chest pain.  No shortness of breath.  Alert and oriented to self place and time.  Objective: Vitals:   08/24/18 1300 08/24/18 2004 08/25/18 0430 08/25/18 0800  BP: 115/79 109/74 119/76 103/74  Pulse: 71 74 80 77  Resp: 16 12 12    Temp: 98.2 F (36.8 C) 98.2 F (36.8 C) 98.5 F (36.9 C) 98.1 F (36.7 C)  TempSrc: Oral Oral Oral Oral  SpO2: 99% 100% 98% 98%  Weight:      Height:        Intake/Output Summary (Last 24 hours) at 08/25/2018 1031 Last data filed at 08/25/2018 0900 Gross per 24 hour  Intake 960 ml  Output 1600 ml  Net -640 ml   Filed Weights   08/15/18 2352  Weight: 61.4 kg    Examination:  General exam: Appears calm and comfortable  Respiratory system: Clear to auscultation. Respiratory effort normal. Cardiovascular system: S1 & S2 heard, RRR. No JVD, murmurs, rubs, gallops or clicks. No pedal edema. Gastrointestinal system: Abdomen is nondistended, soft and nontender. No organomegaly or masses felt. Normal bowel sounds heard. Central nervous system: Alert and oriented. No focal neurological deficits. Extremities: Symmetric 5 x 5 power. Skin: No rashes, lesions or ulcers Psychiatry: Judgement and insight appear normal. Mood & affect appropriate.     Data Reviewed: I have personally reviewed following labs and imaging studies  CBC: Recent Labs  Lab 08/21/18 0506 08/22/18 0537 08/23/18 0555 08/24/18 0555 08/25/18 0552  WBC 5.9 5.8 5.7 5.3 7.4  NEUTROABS 3.2 3.4 3.3 3.2 4.7  HGB 12.5* 12.4* 12.3* 12.7* 12.7*  HCT 38.5* 37.5* 37.5* 37.9* 38.0*  MCV 96.7 95.4 95.7 94.8 95.2  PLT 78* 74* 65* 67* 76*   Basic Metabolic Panel: Recent Labs  Lab 08/21/18 0506 08/22/18 0537 08/23/18 0555  08/24/18 0555 08/25/18 0552  NA 143 140 138 139 136  K 4.1 3.1* 4.0 3.9 4.0  CL 106 102 105 106 102  CO2 29 32 27 26 30   GLUCOSE 79 99 95 90 97  BUN 9 8 6  5* 7  CREATININE 0.61 0.64 0.59* 0.37* 0.65  CALCIUM 8.9 8.3* 8.3* 8.2* 8.5*  MG 1.7 1.8 1.6* 1.7 1.7  PHOS 3.7 2.7 2.4* 4.0 3.8   GFR: Estimated Creatinine Clearance: 88.5 mL/min (by C-G formula based on SCr of 0.65 mg/dL). Liver Function Tests: Recent Labs  Lab 08/21/18 0506 08/22/18 0537 08/23/18 0555 08/24/18 0555 08/25/18 0552  AST 74* 74* 78* 86* 94*  ALT 37 40 44 48* 51*  ALKPHOS 140* 140* 128* 135* 143*  BILITOT 5.7* 5.1* 5.1* 5.5* 5.9*  PROT 5.7* 5.7* 5.5* 5.6* 5.8*  ALBUMIN 2.4* 2.3* 2.1* 2.3* 2.4*   No results for input(s): LIPASE, AMYLASE in the last 168 hours. Recent Labs  Lab 08/19/18 0831 08/20/18 0830 08/21/18 0928 08/22/18 0847 08/23/18 0555  AMMONIA 87* 90* 87* 147* 53*   Coagulation Profile: Recent Labs  Lab 08/23/18 0555  INR 2.59   Cardiac Enzymes: No results for input(s): CKTOTAL, CKMB, CKMBINDEX, TROPONINI in the last 168 hours.  BNP (last 3 results) No results for input(s): PROBNP in the last 8760 hours. HbA1C: No results for input(s): HGBA1C in the last 72 hours. CBG: No results for input(s): GLUCAP in the last 168 hours. Lipid Profile: No results for input(s): CHOL, HDL, LDLCALC, TRIG, CHOLHDL, LDLDIRECT in the last 72 hours. Thyroid Function Tests: No results for input(s): TSH, T4TOTAL, FREET4, T3FREE, THYROIDAB in the last 72 hours. Anemia Panel: No results for input(s): VITAMINB12, FOLATE, FERRITIN, TIBC, IRON, RETICCTPCT in the last 72 hours. Sepsis Labs: No results for input(s): PROCALCITON, LATICACIDVEN in the last 168 hours.  Recent Results (from the past 240 hour(s))  Culture, Urine     Status: Abnormal   Collection Time: 08/22/18 10:26 AM  Result Value Ref Range Status   Specimen Description   Final    URINE, CLEAN CATCH Performed at [facility], [address] [place], [place] [address]    Special Requests   Final    NONE Performed at [facility], [address] [place], [place] [address]    Culture (A)  Final    <10,000 COLONIES/mL INSIGNIFICANT GROWTH Performed at [facility], [address] [place], [place] [address]    Report Status 08/23/2018 FINAL  Final  Culture, blood (routine x 2)     Status: None (Preliminary result)   Collection Time: 08/22/18 11:02 AM  Result Value Ref Range Status   Specimen Description   Final    BLOOD RIGHT ARM Performed at [facility], [address] [place], [place] [address]    Special Requests   Final    BAA BCAV Performed at [facility], [address], [place], [place] [address]    Culture   Final    NO GROWTH 2 DAYS Performed at [facility], [address] [place], [place] [address]    Report Status PENDING  Incomplete  Culture, blood (routine x 2)     Status: None (Preliminary result)   Collection Time: 08/22/18 11:02 AM  Result Value Ref Range Status   Specimen Description   Final    BLOOD LEFT ARM Performed at [facility], [address] [place], [place] [address]    Special Requests   Final    BAA BCAV Performed at [facility], [address] [place], [place] [address]    Culture   Final    NO GROWTH 2 DAYS Performed at [facility], [address], [place], [place] [address]    Report Status PENDING  Incomplete  Body fluid culture     Status: None (Preliminary result)   Collection Time: 08/23/18 12:55 PM  Result Value Ref Range Status   Specimen Description   Final    Peritoneal Performed at [facility], [address], [place], [place] [address]    Special Requests   Final    NONE Performed at [facility], [address] [place], [place] [address]    Gram Stain NO WBC SEEN NO  ORGANISMS SEEN   Final   Culture   Final    NO GROWTH 2 DAYS Performed at [facility], [address], [place], [place] [address]    Report Status PENDING  Incomplete         Radiology Studies: Ir Paracentesis  Result Date: 08/23/2018 INDICATION: Patient with history of end-stage liver disease/cirrhosis with prior TIPS, recurrent ascites; request made  for diagnostic and therapeutic paracentesis. EXAM: ULTRASOUND GUIDED DIAGNOSTIC AND THERAPEUTIC PARACENTESIS MEDICATIONS: None COMPLICATIONS: None immediate. PROCEDURE: Informed written consent was obtained from the patient after a discussion of the risks, benefits and alternatives to treatment. A timeout was performed prior to the initiation of the procedure. Initial ultrasound scanning demonstrates a small amount of ascites within the left lower abdominal quadrant. The left lower abdomen was prepped and draped in the usual sterile fashion. 1% lidocaine was used for local anesthesia. Following this, a 19 gauge, 7-cm, Yueh catheter was introduced. An ultrasound image was saved for documentation purposes. The paracentesis was performed. The catheter was removed and a dressing was applied. The patient tolerated the procedure well without immediate post procedural complication. FINDINGS: A total of approximately 750 cc of yellow fluid was removed. Samples were sent to the laboratory as requested by the clinical team. IMPRESSION: Successful ultrasound-guided diagnostic and therapeutic paracentesis yielding 750 cc of peritoneal fluid. Read by: Jeananne Rama, PA-C Electronically Signed   By: Malachy Moan M.D.   On: 08/23/2018 13:01        Scheduled Meds: . buPROPion  100 mg Oral Daily  . feeding supplement (ENSURE ENLIVE)  237 mL Oral BID BM  . FLUoxetine  20 mg Oral Daily  . furosemide  40 mg Oral Daily  . gabapentin  300 mg Oral QHS  . lactulose  45 g Oral TID  . midodrine  5 mg Oral TID WC  . multivitamin with minerals  1 tablet  Oral Daily  . nicotine  21 mg Transdermal Daily  . rifaximin  550 mg Oral BID  . spironolactone  100 mg Oral Daily   Continuous Infusions: . sodium chloride Stopped (08/23/18 1018)  . magnesium sulfate 1 - 4 g bolus IVPB 4 g (08/25/18 0953)     LOS: 9 days    Time spent: 35 minutes    Ramiro Harvest, MD [facility] Pager [phone]  If 7PM-7AM, please contact night-coverage [email or web address] Password [email or web address] 08/25/2018, 10:31 AM

## 2018-08-25 NOTE — Progress Notes (Signed)
   08/25/18 1600  Clinical Encounter Type  Visited With Patient and family together  Visit Type Follow-up  Referral From Palliative care team  Consult/Referral To Chaplain  Chaplain returned for a follow up visit with Pt.  Pt. was awake and sitting up in his bed during the time with the chaplain. Pt. son was present in the room.  The Pt. was unaware of his next steps in his healthcare. The Pt. floated between he had not been told about his next steps and maybe he forgot. A consistent message the chaplain heard from the Pt. was he wanted to see his mother.  The Pt. 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  The Pt. concluded a phone call to his mother would be too confusing. The chaplain wrote down her name and how to get in touch with her for future spiritual care.

## 2018-08-25 NOTE — Progress Notes (Signed)
Nutrition Follow-up  DOCUMENTATION CODES:   Non-severe (moderate) malnutrition in context of chronic illness  INTERVENTION:  - Continue Ensure Enlive BID. - Continue to encourage PO intakes. - Weigh patient today.    NUTRITION DIAGNOSIS:   Moderate Malnutrition related to chronic illness(ETOH cirrhosis) as evidenced by percent weight loss, moderate fat depletion, severe muscle depletion. -ongoing  GOAL:   Patient will meet greater than or equal to 90% of their needs -minimally met on average  MONITOR:   PO intake, Supplement acceptance, Weight trends, Labs, I & O's  ASSESSMENT:   57 y.o. male with history of hepatic cirrhosis status post TIPS procedure with history of esophageal varices who has had recent paracentesis done 2 weeks ago was brought to the ER after patient's family found patient was increasingly confused.  Patient also states that over the past 4 days patient felt that his abdomen is getting more distended and he has has episodes of nausea and vomiting.   No new weight since admission on 10/20. Patient continues to eat 75-100% on average, typically only consumes 2 meal/day. Yesterday he only consumed 30% of lunch which was unusual for him. He continues to consume most of the Ensure bottles provided.    Medications reviewed; 40 mg oral Lasix/day, 45 g lactulose TID, 2 g IV Mg sulfate x1 run yesterday, 4 g IV Mg sulfate x1 run today, daily multivitamin with minerals, 100 mg Aldactone/day. Labs reviewed; Ca: 8.5 mg/dL, Alk Phos elevated, LFTs elevated.        Diet Order:   Diet Order            DIET SOFT Room service appropriate? Yes; Fluid consistency: Thin; Fluid restriction: 1200 mL Fluid  Diet effective now        Diet - low sodium heart healthy              EDUCATION NEEDS:   Not appropriate for education at this time  Skin:  Skin Assessment: Reviewed RN Assessment  Last BM:  10/30  Height:   Ht Readings from Last 1 Encounters:  08/15/18  '5\' 7"'  (1.702 m)    Weight:   Wt Readings from Last 1 Encounters:  08/15/18 61.4 kg    Ideal Body Weight:  67.3 kg  BMI:  Body mass index is 21.2 kg/m.  Estimated Nutritional Needs:   Kcal:  1850-2050  Protein:  90-100g  Fluid:  Per MD     Jarome Matin, MS, RD, LDN, CNSC Inpatient Clinical Dietitian Pager # [phone] After hours/weekend pager # [phone]

## 2018-08-26 DIAGNOSIS — F325 Major depressive disorder, single episode, in full remission: Secondary | ICD-10-CM

## 2018-08-26 DIAGNOSIS — R188 Other ascites: Secondary | ICD-10-CM

## 2018-08-26 DIAGNOSIS — D696 Thrombocytopenia, unspecified: Secondary | ICD-10-CM

## 2018-08-26 DIAGNOSIS — E440 Moderate protein-calorie malnutrition: Secondary | ICD-10-CM

## 2018-08-26 LAB — BASIC METABOLIC PANEL
ANION GAP: 6 (ref 5–15)
BUN: 7 mg/dL (ref 6–20)
CHLORIDE: 103 mmol/L (ref 98–111)
CO2: 28 mmol/L (ref 22–32)
Calcium: 8.6 mg/dL — ABNORMAL LOW (ref 8.9–10.3)
Creatinine, Ser: 0.46 mg/dL — ABNORMAL LOW (ref 0.61–1.24)
GFR calc non Af Amer: >60 mL/min (ref >60)
GLUCOSE: 93 mg/dL (ref 70–99)
Potassium: 3.3 mmol/L — ABNORMAL LOW (ref 3.5–5.1)
Sodium: 137 mmol/L (ref 135–145)

## 2018-08-26 LAB — CBC
HEMATOCRIT: 36.3 % — AB (ref 39.0–52.0)
Hemoglobin: 12.3 g/dL — ABNORMAL LOW (ref 13.0–17.0)
MCH: 31.7 pg (ref 26.0–34.0)
MCHC: 33.9 g/dL (ref 30.0–36.0)
MCV: 93.6 fL (ref 80.0–100.0)
NRBC: 0 % (ref 0.0–0.2)
Platelets: 69 10*3/uL — ABNORMAL LOW (ref 150–400)
RBC: 3.88 MIL/uL — AB (ref 4.22–5.81)
RDW: 17.2 % — ABNORMAL HIGH (ref 11.5–15.5)
WBC: 6.3 10*3/uL (ref 4.0–10.5)

## 2018-08-26 LAB — MAGNESIUM: Magnesium: 1.7 mg/dL (ref 1.7–2.4)

## 2018-08-26 MED ORDER — FLUOXETINE HCL 20 MG PO CAPS: 20.0000 mg | ORAL_CAPSULE | Freq: Every day | ORAL | 0 refills | Status: DC

## 2018-08-26 MED ORDER — POTASSIUM CHLORIDE CRYS ER 20 MEQ PO TBCR
40.0000 meq | EXTENDED_RELEASE_TABLET | Freq: Once | ORAL | Status: AC
  Administered 2018-08-26: 40 meq via ORAL
  Filled 2018-08-26: qty 2

## 2018-08-26 MED ORDER — BUPROPION HCL ER (SR) 100 MG PO TB12: 100.0000 mg | ORAL_TABLET | Freq: Every day | ORAL | 0 refills | Status: DC

## 2018-08-26 MED ORDER — MAGNESIUM SULFATE 4 GM/100ML IV SOLN
4.0000 g | Freq: Once | INTRAVENOUS | Status: AC
  Administered 2018-08-26: 4 g via INTRAVENOUS
  Filled 2018-08-26: qty 100

## 2018-08-26 MED ORDER — RIFAXIMIN 550 MG PO TABS: 550.0000 mg | ORAL_TABLET | Freq: Two times a day (BID) | ORAL | 0 refills | Status: DC

## 2018-08-26 MED ORDER — SPIRONOLACTONE 100 MG PO TABS: 100.0000 mg | ORAL_TABLET | Freq: Every day | ORAL | 0 refills | Status: DC

## 2018-08-26 MED ORDER — FUROSEMIDE 40 MG PO TABS: 40.0000 mg | ORAL_TABLET | Freq: Every day | ORAL | 0 refills | Status: DC

## 2018-08-26 MED ORDER — NICOTINE 21 MG/24HR TD PT24: 21.0000 mg | MEDICATED_PATCH | Freq: Every day | TRANSDERMAL | 0 refills | Status: DC

## 2018-08-26 MED ORDER — LACTULOSE 10 GM/15ML PO SOLN: 45.0000 g | Freq: Three times a day (TID) | ORAL | 0 refills | Status: DC

## 2018-08-26 NOTE — Progress Notes (Addendum)
Pt accepted to [facility] SNF- report [phone]. Accepted with 30 day letter of guarantee approved by SW AD. Pt has pending Medicaid per financial counseling. Will arrange transportation once pt ready for DC. (pt's son planning to transport him via private vehicle)  Ilean Skill, MSW, LCSW Clinical Social Work 08/26/2018 [phone] coverage for [phone]

## 2018-08-26 NOTE — Care Management Note (Signed)
Case Management Note  Patient Details  Name: ENDRIT GITTINS MRN: [id number] Date of Birth: Mar 24, 1961  Subjective/Objective:d/c SNF-[name] today-CSW managing.                    Action/Plan:d/c SNF.   Expected Discharge Date:  08/17/18               Expected Discharge Plan:  Skilled Nursing Facility  In-House Referral:  Clinical Social Work  Discharge planning Services  CM Consult  Post Acute Care Choice:    Choice offered to:     DME Arranged:    DME Agency:     HH Arranged:    HH Agency:     Status of Service:  Completed, signed off  If discussed at [employer], dates discussed:    Additional Comments:  Lanier Clam, RN 08/26/2018, 11:53 AM

## 2018-08-26 NOTE — Discharge Summary (Signed)
Physician Discharge Summary  Kenneth [name] ZOX:[id number] DOB: 04/18/1961 DOA: 08/15/2018  PCP: [name], MD  Admit date: 08/15/2018 Discharge date: 08/26/2018  Time spent: 60 minutes  Recommendations for Outpatient Follow-up:  1. Follow up with MD at SNF.  On follow-up patient will need a basic metabolic profile and magnesium level done in 1 week to follow-up on electrolytes and renal function. 2. Follow-up with gastroenterology, Willette Cluster, NP 09/08/2018.   Discharge Diagnoses:  Principal Problem:   Hepatic encephalopathy (HCC) Active Problems:   Portal hypertension with esophageal varices (HCC)   Thrombocytopenia (HCC)   Alcoholic cirrhosis of liver with ascites (HCC)   Ascites   Depression   Hypokalemia   Hypomagnesemia   Malnutrition of moderate degree   Accelerated junctional rhythm   Acute liver failure with hepatic coma (HCC)   Discharge Condition: Stable and improved.  Diet recommendation: Heart healthy diet, 1200cc fluid restriction.  Filed Weights   08/15/18 2352  Weight: 61.4 kg    History of present illness:  Per Dr. Jeri Modena is a 57 y.o. male with history of hepatic cirrhosis status post TIPS procedure with history of esophageal varices who has had recent paracentesis done 2 weeks ago was brought to the ER after patient's family found patient was increasingly confused.  Patient also stated that over the last 4 days patient felt that his abdomen was getting more distended with discomfort no definite pain but increasing distention is making it uncomfortable.  Last 2 days patient had nausea vomiting denies any diarrhea.  ED Course: In the ER patient was oriented to his name and place, ammonia levels were around 79.  Abdomen appearred distended.  Denied any chest pain or shortness of breath.  Patient was given fluid in the ER.  Admitted for hepatic encephalopathy and abdominal distention.  Labs also show markedly hypokalemic.  For  which patient is getting potassium replacement.   Hospital Course:  Hepatic Encephalopathy  Hyperammonemia  -Improved clinically and was close to baseline by day of discharge.  Patient maintained on lactulose and Xifaxan. -Ammonia Level went from 79 ->102 ->87 ->90 ->87 -> 147 -> 53 -Increased Lactulose 45 g p.o. 3 times daily which patient will be discharged on. -Patient was initially on lactulose enemas which have since been discontinued.  -Gastroenterology was consulted and followed the patient during the hospitalization.  -Gastroenterology recommended starting Xifaxan 550 mill grams p.o. twice daily -Gastroenterology recommended obtaining a diagnostic and therapeutic Paracentesis to rule out SBP and yellow fluid removed and a portion of the fluid was submitted to lab for preordered studies by Gastroenterology; paracentesis negative for SBP.  -Ascitic Fluid sent for Cx and isshowed NG in 3 days on day of discharge.  Patient remained afebrile.  Patient did not have any abdominal pain.   -GI recommending continuing current dose of diuretics and monitoring and repleting electrolytes -I doubt this is infectious as UA was unremarkable however urine culture (<10,000 CFU of Insignificant Growth) and Blood cultures (NGTD at 3 Days). -Per GI patient did not show up for his liver transplant appointment however they are skeptical about his candidacy for liver transplant at this point now. -Palliative Care consulted for further assistance and GOC; treatment with the patient and his sister as well as his son and current plans to continue interventions for another 48hours and if he further declines and would be agreeable to hospice discussion however patient is significantly improved but prognosis is still very poor. Appreciate Dr. Onnie Boer  evaluation recommendations. [name]. -Gastroenterology  recommended coordinating of the hepatology consultation again in office with in 1 to 2 weeks and will schedule another outpatient paracentesis.   -Gastroenterology recommended continue current diuretics along with Midodrineand lactulose. Gastroenterology also recommended continuing with rifaximin 550 mg twice daily if patient is able to afford it as an outpatient and not prohibitively expensive.\ -Patient be discharged to skilled nursing facility at [facility] in stable and improved condition.  Hypokalemia Hypomagnesemia -Secondary to GI losses from vomiting as well as from diuretics. -Potassium repleted.    Magnesium was repleted.  It was noted that patient was on oral potassium supplementation on discharge which will be continued.  Outpatient follow-up.    AcceleratedJunctional rhythm -Correctedelectrolyte abnormalities- HR was junctional in 60s -Patient was seen in consultation by cardiology.  Cardiology did not feel any further work-up needed at this time.  Patient remained asymptomatic.  Dr. Butler Denmark discussed with Dr Delton See  - BP stable in90-100s- cont Midodrine for this -Per cardiology no further work-up needed at this time.  Nausea and Vomiting, improved  -Patient placed on Antiemetics with po/IV Zofran -Patient has Phenergan 6.25 mg IV q6hPRN for Breakthrough Nausea and Vomiting -GI Consulted for Hepatic Encephalopathy and followed during the hospitalization. -Appreciate any further reccs for N/V  Chronic Hypotension -Patient was maintained on home regimen of midodrine 5 mg 3 times daily with meals.  Alcoholic cirrhosis of liver with ascites  -S/p paracentesis as mentioned above in HPI -Abdomen improved post paracentesis remain nontender, nondistended with positive bowel sounds.   -Patient had received a dose of IV Rocephin which was subsequently  discontinued.  No need for paracentesis at time of discharge.   -Patient was placed on a regimen of spironolactone 100 mg  daily and Lasix 40 mg daily at this time.    Patient also maintained on fluid restriction of 1200 cc/day. -Patient no longer consumes alcohol.   -Palliative care consulted for goals of care discussion appreciate palliative evaluation -Gastroenterology started the patient on Xifaxan 550 mg p.o. twice dailyand will continue for now in addition to lactulose. -GI recommended coordinating on her pathology consultation again for liver transplant and following up with GI in 2 weeks and having a scheduled paracentesis in a week or 2 -Outpatient follow-up with gastroenterology.  Advanced Cirrhosis Portal hypertension with esophageal varices Splenomegaly Elevated INR (2.57) Hyperbilirubinemia (T Bili is 5.5) Abnormal LFTs  -s/p TIPS -As above -GI was consulted and followed the patient throughout the hospitalization.  Patient was supposed to follow-up with the liver transplant specialist and had a liver transplant appointment however did not show for it and is now awaiting other referral to [facility] but continued to decline. -GI ordered Diagnostic and Therapeutic Paracentesis and yielded 750 mL of yellow fluidnegative for SBP -Per GI recommending paracentesis every 1 to 2 weeks which will be arranged per GI in the outpatient setting. -Patient will follow up with gastroenterology in the outpatient setting.  Thrombocytopenia  -Likely secondary to cirrhosis and splenomegaly.  Patient with no overt bleeding.  Stable.  Platelet count stabilized at 69 by day of discharge.    Depression/Anxiety -Welbutrin dose was changed from 150 mg to 100 mg daily due to poor liver function.  -Fluoxetine has been changed from 40 mg to 20 mg daily.  Continued on gabapentin 300 mg nightly.  Normocytic Anemia -Hemoglobin stable at 12.7.  -In the setting  of liver cirrhosis -Patient had no signs or symptoms of bleeding.  Non-severe/Moderate malnutrition in the context of chronic illness -Nutrition  was consulted for further evaluation recommendations -Nutrition recommended Ensure twice daily as well as encouragement of oral intake.  Outpatient follow-up.      Procedures:  Acute abdominal series 08/16/2018  Ultrasound-guided diagnostic and therapeutic paracentesis 08/23/2018 with 750 cc of yellow fluid removed per interventional radiology, Dr. Malachy Moan  Consultations:  Cardiology: Dr. Delton See 08/16/2018  Palliative care: Dr. Neale Burly 08/21/2018  Gastroenterology: Dr. Leone Payor 08/22/2018  Discharge Exam: Vitals:   08/26/18 0535 08/26/18 0942  BP: 120/75 102/73  Pulse: 80 81  Resp: 18 13  Temp: 98.2 F (36.8 C) 98 F (36.7 C)  SpO2: 98% 99%    General: NAD Cardiovascular: RRR Respiratory: CTAB  Discharge Instructions   Discharge Instructions    Diet - low sodium heart healthy   Complete by:  As directed    1200cc/day fluid restriction   Increase activity slowly   Complete by:  As directed      Allergies as of 08/26/2018      Reactions   Latex Swelling, Rash      Medication List    STOP taking these medications   aMILoride 5 MG tablet Commonly known as:  MIDAMOR     TAKE these medications   buPROPion 100 MG 12 hr tablet Commonly known as:  WELLBUTRIN SR Take 1 tablet (100 mg total) by mouth daily. What changed:    medication strength  how much to take  when to take this   diclofenac sodium 1 % Gel Commonly known as:  VOLTAREN Apply 4 g topically 4 (four) times daily.   ergocalciferol 50000 units capsule Commonly known as:  VITAMIN D2 Take 1 capsule (50,000 Units total) by mouth once a week.   feeding supplement (ENSURE ENLIVE) Liqd Take 237 mLs by mouth 2 (two) times daily between meals.   FLUoxetine 20 MG capsule Commonly known as:  PROZAC Take 1 capsule (20 mg total) by mouth daily. Start taking on:  08/27/2018 What changed:    medication strength  how much to take   furosemide 40 MG tablet Commonly known as:   LASIX Take 1 tablet (40 mg total) by mouth daily. What changed:  how much to take   gabapentin 300 MG capsule Commonly known as:  NEURONTIN Take 1 capsule (300 mg total) by mouth at bedtime.   lactulose 10 GM/15ML solution Commonly known as:  CHRONULAC Take 67.5 mLs (45 g total) by mouth 3 (three) times daily. Increase to 4 tablespoons by mouth twice daily. What changed:    how much to take  how to take this  when to take this   midodrine 5 MG tablet Commonly known as:  PROAMATINE Take 1 tablet (5 mg total) by mouth 3 (three) times daily with meals.   multivitamin with minerals Tabs tablet Take 1 tablet by mouth daily.   nicotine 21 mg/24hr patch Commonly known as:  NICODERM CQ - dosed in mg/24 hours Place 1 patch (21 mg total) onto the skin daily. Start taking on:  08/27/2018   ondansetron 4 MG tablet Commonly known as:  ZOFRAN Take 1 tablet (4 mg total) by mouth every 8 (eight) hours as needed for nausea or vomiting.   Potassium Chloride ER 20 MEQ Tbcr Take 40 mEq by mouth daily.   rifaximin 550 MG Tabs tablet Commonly known as:  XIFAXAN Take 1 tablet (550 mg total) by mouth  2 (two) times daily.   spironolactone 100 MG tablet Commonly known as:  ALDACTONE Take 1 tablet (100 mg total) by mouth daily. Start taking on:  08/27/2018      Allergies  Allergen Reactions  . Latex Swelling and Rash   Follow-up Information    [facility] Follow up.   Specialty:  Home Health Services Why:  [facility] nursing/physical therapy/aide/social worker Contact information: [address] [place] [address] [phone]        Meredith Pel, NP Follow up on 09/08/2018.   Specialty:  Gastroenterology Why:  10:30 am Contact information: [address] [place] [address] [phone]        MD AT SNF Follow up.            The results of significant diagnostics from this hospitalization (including imaging, microbiology, ancillary  and laboratory) are listed below for reference.    Significant Diagnostic Studies: Dg Abd Acute W/chest  Result Date: 08/16/2018 CLINICAL DATA:  Abdominal pain. EXAM: DG ABDOMEN ACUTE W/ 1V CHEST COMPARISON:  CT 04/26/2018 FINDINGS: Linear subsegmental atelectasis at the left lung base. No confluent consolidation. No pleural fluid. No free intra-abdominal air. No bowel dilatation to suggest obstruction. Air-filled small bowel in the right abdomen is nondilated and appears similar to prior CT. Moderate colonic stool burden with stool in the rectum. Calcified gallstones. TIPS in the right upper quadrant. Pelvic calcifications consistent with phleboliths. IMPRESSION: 1. No bowel obstruction or free air.  Moderate colonic stool burden. 2. Gallstones. 3. Subsegmental atelectasis at the left lung base. Electronically Signed   By: Narda Rutherford M.D.   On: 08/16/2018 06:06   Ir Paracentesis  Result Date: 08/23/2018 INDICATION: Patient with history of end-stage liver disease/cirrhosis with prior TIPS, recurrent ascites; request made for diagnostic and therapeutic paracentesis. EXAM: ULTRASOUND GUIDED DIAGNOSTIC AND THERAPEUTIC PARACENTESIS MEDICATIONS: None COMPLICATIONS: None immediate. PROCEDURE: Informed written consent was obtained from the patient after a discussion of the risks, benefits and alternatives to treatment. A timeout was performed prior to the initiation of the procedure. Initial ultrasound scanning demonstrates a small amount of ascites within the left lower abdominal quadrant. The left lower abdomen was prepped and draped in the usual sterile fashion. 1% lidocaine was used for local anesthesia. Following this, a 19 gauge, 7-cm, Yueh catheter was introduced. An ultrasound image was saved for documentation purposes. The paracentesis was performed. The catheter was removed and a dressing was applied. The patient tolerated the procedure well without immediate post procedural complication.  FINDINGS: A total of approximately 750 cc of yellow fluid was removed. Samples were sent to the laboratory as requested by the clinical team. IMPRESSION: Successful ultrasound-guided diagnostic and therapeutic paracentesis yielding 750 cc of peritoneal fluid. Read by: Jeananne Rama, PA-C Electronically Signed   By: Malachy Moan M.D.   On: 08/23/2018 13:01   Ir Paracentesis  Result Date: 08/03/2018 INDICATION: History of cirrhosis s/p TIPS with recurrent ascites. Request for therapeutic paracentesis today with 5 L limit and post procedure albumin administration. EXAM: ULTRASOUND GUIDED THERAPEUTIC PARACENTESIS MEDICATIONS: 10 mL 2% lidocaine. COMPLICATIONS: None immediate. PROCEDURE: Informed written consent was obtained from the patient after a discussion of the risks, benefits and alternatives to treatment. A timeout was performed prior to the initiation of the procedure. Initial ultrasound scanning demonstrates a large amount of ascites within the left lower abdominal quadrant. The left lower abdomen was prepped and draped in the usual sterile fashion. 2% lidocaine was used for local  anesthesia. Following this, a 19 gauge, 7-cm, Yueh catheter was introduced. An ultrasound image was saved for documentation purposes. The paracentesis was performed. The catheter was removed and a dressing was applied. The patient tolerated the procedure well without immediate post procedural complication. FINDINGS: A total of approximately 4.1L of yellow fluid was removed. IMPRESSION: Successful ultrasound-guided paracentesis yielding 4.1 liters of peritoneal fluid. Read by Lynnette Caffey, PA-C Electronically Signed   By: Gilmer Mor D.O.   On: 08/03/2018 14:15    Microbiology: Recent Results (from the past 240 hour(s))  Culture, Urine     Status: Abnormal   Collection Time: 08/22/18 10:26 AM  Result Value Ref Range Status   Specimen Description   Final    URINE, CLEAN CATCH Performed at [facility], [address], [place], [place] [address]    Special Requests   Final    NONE Performed at [facility], [address] [place], [place] [address]    Culture (A)  Final    <10,000 COLONIES/mL INSIGNIFICANT GROWTH Performed at [facility], [address], [place], [place] [address]    Report Status 08/23/2018 FINAL  Final  Culture, blood (routine x 2)     Status: None (Preliminary result)   Collection Time: 08/22/18 11:02 AM  Result Value Ref Range Status   Specimen Description   Final    BLOOD RIGHT ARM Performed at [facility], [address] [place], [place] [address]    Special Requests   Final    BAA BCAV Performed at [facility], [address] [place], [place] [address]    Culture   Final    NO GROWTH 3 DAYS Performed at [facility], [address] [place], [place] [address]    Report Status PENDING  Incomplete  Culture, blood (routine x 2)     Status: None (Preliminary result)   Collection Time: 08/22/18 11:02 AM  Result Value Ref Range Status   Specimen Description   Final    BLOOD LEFT ARM Performed at [facility], [address], [place], [place] [address]    Special Requests   Final    BAA BCAV Performed at [facility], [address], [place], [place] [address]    Culture   Final    NO GROWTH 3 DAYS Performed at [facility], [address] [place], [place] [address]    Report Status PENDING  Incomplete  Body fluid culture     Status: None (Preliminary result)   Collection Time: 08/23/18 12:55 PM  Result Value Ref Range Status   Specimen Description   Final    Peritoneal Performed at [facility], [address] [place], [place] [address]    Special Requests   Final    NONE Performed at [facility], [address], [place], [place] [address]    Gram Stain NO WBC SEEN NO  ORGANISMS SEEN   Final   Culture   Final    NO GROWTH 3 DAYS Performed at [facility], [address] [place], [place] [address]    Report Status PENDING  Incomplete     Labs: Basic Metabolic Panel: Recent Labs  Lab 08/21/18 0506 08/22/18 0537 08/23/18 0555 08/24/18 0555 08/25/18 0552 08/26/18 0538  NA 143 140 138 139 136 137  K 4.1 3.1* 4.0 3.9 4.0 3.3*  CL 106 102 105  106 102 103  CO2 29 32 27 26 30 28   GLUCOSE 79 99 95 90 97 93  BUN 9 8 6  5* 7 7  CREATININE 0.61 0.64 0.59* 0.37* 0.65 0.46*  CALCIUM 8.9 8.3* 8.3* 8.2* 8.5* 8.6*  MG 1.7 1.8 1.6* 1.7 1.7 1.7  PHOS 3.7 2.7 2.4* 4.0 3.8  --    Liver Function Tests: Recent Labs  Lab 08/21/18 0506 08/22/18 0537 08/23/18 0555 08/24/18 0555 08/25/18 0552  AST 74* 74* 78* 86* 94*  ALT 37 40 44 48* 51*  ALKPHOS 140* 140* 128* 135* 143*  BILITOT 5.7* 5.1* 5.1* 5.5* 5.9*  PROT 5.7* 5.7* 5.5* 5.6* 5.8*  ALBUMIN 2.4* 2.3* 2.1* 2.3* 2.4*   No results for input(s): LIPASE, AMYLASE in the last 168 hours. Recent Labs  Lab 08/20/18 0830 08/21/18 0928 08/22/18 0847 08/23/18 0555  AMMONIA 90* 87* 147* 53*   CBC: Recent Labs  Lab 08/21/18 0506 08/22/18 0537 08/23/18 0555 08/24/18 0555 08/25/18 0552 08/26/18 0538  WBC 5.9 5.8 5.7 5.3 7.4 6.3  NEUTROABS 3.2 3.4 3.3 3.2 4.7  --   HGB 12.5* 12.4* 12.3* 12.7* 12.7* 12.3*  HCT 38.5* 37.5* 37.5* 37.9* 38.0* 36.3*  MCV 96.7 95.4 95.7 94.8 95.2 93.6  PLT 78* 74* 65* 67* 76* 69*   Cardiac Enzymes: No results for input(s): CKTOTAL, CKMB, CKMBINDEX, TROPONINI in the last 168 hours. BNP: BNP (last 3 results) No results for input(s): BNP in the last 8760 hours.  ProBNP (last 3 results) No results for input(s): PROBNP in the last 8760 hours.  CBG: No results for input(s): GLUCAP in the last 168 hours.     Signed:  Ramiro Harvest MD.  [facility] 08/26/2018, 2:15 PM

## 2018-08-27 LAB — CULTURE, BLOOD (ROUTINE X 2)
Culture: NO GROWTH
Culture: NO GROWTH

## 2018-08-27 LAB — BODY FLUID CULTURE
Culture: NO GROWTH
Gram Stain: NONE SEEN

## 2018-08-30 ENCOUNTER — Ambulatory Visit: Payer: Self-pay | Admitting: Family Medicine

## 2018-09-02 ENCOUNTER — Inpatient Hospital Stay (HOSPITAL_COMMUNITY)
Admission: EM | Admit: 2018-09-02 | Discharge: 2018-09-04 | DRG: 432 | Disposition: A | Discharge status: Skilled Nursing Facility | Payer: Medicaid Other | Attending: Internal Medicine | Admitting: Internal Medicine

## 2018-09-02 ENCOUNTER — Encounter (HOSPITAL_COMMUNITY): Payer: Self-pay | Admitting: General Practice

## 2018-09-02 ENCOUNTER — Emergency Department (HOSPITAL_COMMUNITY): Payer: Medicaid Other

## 2018-09-02 ENCOUNTER — Other Ambulatory Visit: Payer: Self-pay

## 2018-09-02 DIAGNOSIS — K7290 Hepatic failure, unspecified without coma: Secondary | ICD-10-CM | POA: Diagnosis present

## 2018-09-02 DIAGNOSIS — K8020 Calculus of gallbladder without cholecystitis without obstruction: Secondary | ICD-10-CM | POA: Diagnosis present

## 2018-09-02 DIAGNOSIS — K7030 Alcoholic cirrhosis of liver without ascites: Secondary | ICD-10-CM | POA: Diagnosis present

## 2018-09-02 DIAGNOSIS — K766 Portal hypertension: Secondary | ICD-10-CM | POA: Diagnosis present

## 2018-09-02 DIAGNOSIS — R9431 Abnormal electrocardiogram [ECG] [EKG]: Secondary | ICD-10-CM | POA: Diagnosis present

## 2018-09-02 DIAGNOSIS — K859 Acute pancreatitis without necrosis or infection, unspecified: Secondary | ICD-10-CM | POA: Diagnosis present

## 2018-09-02 DIAGNOSIS — K7682 Hepatic encephalopathy: Secondary | ICD-10-CM | POA: Diagnosis present

## 2018-09-02 DIAGNOSIS — R4182 Altered mental status, unspecified: Secondary | ICD-10-CM

## 2018-09-02 DIAGNOSIS — E876 Hypokalemia: Secondary | ICD-10-CM | POA: Diagnosis present

## 2018-09-02 DIAGNOSIS — Z8249 Family history of ischemic heart disease and other diseases of the circulatory system: Secondary | ICD-10-CM | POA: Diagnosis not present

## 2018-09-02 DIAGNOSIS — K7031 Alcoholic cirrhosis of liver with ascites: Secondary | ICD-10-CM

## 2018-09-02 DIAGNOSIS — F17210 Nicotine dependence, cigarettes, uncomplicated: Secondary | ICD-10-CM | POA: Diagnosis present

## 2018-09-02 DIAGNOSIS — Z79899 Other long term (current) drug therapy: Secondary | ICD-10-CM | POA: Diagnosis not present

## 2018-09-02 DIAGNOSIS — E440 Moderate protein-calorie malnutrition: Secondary | ICD-10-CM | POA: Diagnosis present

## 2018-09-02 DIAGNOSIS — Z6820 Body mass index (BMI) 20.0-20.9, adult: Secondary | ICD-10-CM | POA: Diagnosis not present

## 2018-09-02 DIAGNOSIS — D696 Thrombocytopenia, unspecified: Secondary | ICD-10-CM | POA: Diagnosis present

## 2018-09-02 DIAGNOSIS — I8510 Secondary esophageal varices without bleeding: Secondary | ICD-10-CM | POA: Diagnosis present

## 2018-09-02 DIAGNOSIS — R740 Nonspecific elevation of levels of transaminase and lactic acid dehydrogenase [LDH]: Secondary | ICD-10-CM

## 2018-09-02 DIAGNOSIS — K7040 Alcoholic hepatic failure without coma: Secondary | ICD-10-CM | POA: Diagnosis present

## 2018-09-02 DIAGNOSIS — R7401 Elevation of levels of liver transaminase levels: Secondary | ICD-10-CM

## 2018-09-02 DIAGNOSIS — R410 Disorientation, unspecified: Secondary | ICD-10-CM

## 2018-09-02 DIAGNOSIS — I8500 Esophageal varices without bleeding: Secondary | ICD-10-CM | POA: Diagnosis present

## 2018-09-02 DIAGNOSIS — D6959 Other secondary thrombocytopenia: Secondary | ICD-10-CM | POA: Diagnosis present

## 2018-09-02 LAB — COMPREHENSIVE METABOLIC PANEL
ALK PHOS: 152 U/L — AB (ref 38–126)
ALT: 70 U/L — ABNORMAL HIGH (ref 0–44)
ANION GAP: 9 (ref 5–15)
AST: 107 U/L — ABNORMAL HIGH (ref 15–41)
Albumin: 2.3 g/dL — ABNORMAL LOW (ref 3.5–5.0)
BUN: 8 mg/dL (ref 6–20)
CALCIUM: 8.5 mg/dL — AB (ref 8.9–10.3)
CO2: 26 mmol/L (ref 22–32)
Chloride: 104 mmol/L (ref 98–111)
Creatinine, Ser: 0.75 mg/dL (ref 0.61–1.24)
GFR calc Af Amer: >60 mL/min (ref >60)
Glucose, Bld: 98 mg/dL (ref 70–99)
POTASSIUM: 3 mmol/L — AB (ref 3.5–5.1)
Sodium: 139 mmol/L (ref 135–145)
Total Bilirubin: 6.1 mg/dL — ABNORMAL HIGH (ref 0.3–1.2)
Total Protein: 6.4 g/dL — ABNORMAL LOW (ref 6.5–8.1)

## 2018-09-02 LAB — CBC WITH DIFFERENTIAL/PLATELET
Abs Immature Granulocytes: 0.01 10*3/uL (ref 0.00–0.07)
Basophils Absolute: 0 10*3/uL (ref 0.0–0.1)
Basophils Relative: 1 %
EOS ABS: 0.1 10*3/uL (ref 0.0–0.5)
EOS PCT: 2 %
HEMATOCRIT: 37.1 % — AB (ref 39.0–52.0)
HEMOGLOBIN: 11.9 g/dL — AB (ref 13.0–17.0)
Immature Granulocytes: 0 %
Lymphocytes Relative: 22 %
Lymphs Abs: 0.8 10*3/uL (ref 0.7–4.0)
MCH: 30.2 pg (ref 26.0–34.0)
MCHC: 32.1 g/dL (ref 30.0–36.0)
MCV: 94.2 fL (ref 80.0–100.0)
MONO ABS: 0.8 10*3/uL (ref 0.1–1.0)
MONOS PCT: 21 %
Neutro Abs: 1.9 10*3/uL (ref 1.7–7.7)
Neutrophils Relative %: 54 %
Platelets: 62 10*3/uL — ABNORMAL LOW (ref 150–400)
RBC: 3.94 MIL/uL — ABNORMAL LOW (ref 4.22–5.81)
RDW: 16.8 % — AB (ref 11.5–15.5)
WBC: 3.6 10*3/uL — ABNORMAL LOW (ref 4.0–10.5)
nRBC: 0 % (ref 0.0–0.2)

## 2018-09-02 LAB — MAGNESIUM: MAGNESIUM: 1.6 mg/dL — AB (ref 1.7–2.4)

## 2018-09-02 LAB — AMMONIA: AMMONIA: 120 umol/L — AB (ref 9–35)

## 2018-09-02 LAB — MRSA PCR SCREENING: MRSA BY PCR: NEGATIVE

## 2018-09-02 LAB — LIPASE, BLOOD: Lipase: 116 U/L — ABNORMAL HIGH (ref 11–51)

## 2018-09-02 MED ORDER — MAGNESIUM SULFATE 2 GM/50ML IV SOLN: 2.0000 g | Freq: Once | INTRAVENOUS | Status: AC

## 2018-09-02 MED ORDER — HYDROCODONE-ACETAMINOPHEN 5-325 MG PO TABS: 1.0000 | ORAL_TABLET | ORAL | Status: DC | PRN

## 2018-09-02 MED ORDER — NICOTINE 21 MG/24HR TD PT24
21.0000 mg | MEDICATED_PATCH | Freq: Every day | TRANSDERMAL | Status: DC
  Administered 2018-09-02 – 2018-09-04 (×3): 21 mg via TRANSDERMAL
  Filled 2018-09-02 (×3): qty 1

## 2018-09-02 MED ORDER — SPIRONOLACTONE 25 MG PO TABS
100.0000 mg | ORAL_TABLET | Freq: Every day | ORAL | Status: DC
  Administered 2018-09-02 – 2018-09-04 (×3): 100 mg via ORAL
  Filled 2018-09-02 (×3): qty 4

## 2018-09-02 MED ORDER — DEXTROSE-NACL 5-0.45 % IV SOLN
INTRAVENOUS | Status: DC
  Administered 2018-09-02: 17:00:00 via INTRAVENOUS

## 2018-09-02 MED ORDER — RIFAXIMIN 550 MG PO TABS
550.0000 mg | ORAL_TABLET | Freq: Two times a day (BID) | ORAL | Status: DC
  Administered 2018-09-02 – 2018-09-04 (×5): 550 mg via ORAL
  Filled 2018-09-02 (×5): qty 1

## 2018-09-02 MED ORDER — ADULT MULTIVITAMIN W/MINERALS CH
1.0000 | ORAL_TABLET | Freq: Every day | ORAL | Status: DC
  Administered 2018-09-02 – 2018-09-04 (×3): 1 via ORAL
  Filled 2018-09-02 (×3): qty 1

## 2018-09-02 MED ORDER — LACTULOSE 10 GM/15ML PO SOLN
30.0000 g | Freq: Once | ORAL | Status: AC
  Administered 2018-09-02: 30 g via ORAL
  Filled 2018-09-02: qty 45

## 2018-09-02 MED ORDER — ZOLPIDEM TARTRATE 5 MG PO TABS: 5.0000 mg | ORAL_TABLET | Freq: Every evening | ORAL | Status: DC | PRN

## 2018-09-02 MED ORDER — BOOST / RESOURCE BREEZE PO LIQD CUSTOM
1.0000 | Freq: Three times a day (TID) | ORAL | Status: DC
  Administered 2018-09-02 – 2018-09-04 (×5): 1 via ORAL

## 2018-09-02 MED ORDER — LACTULOSE 10 GM/15ML PO SOLN
45.0000 g | Freq: Three times a day (TID) | ORAL | Status: DC
  Administered 2018-09-02 – 2018-09-04 (×6): 45 g via ORAL
  Filled 2018-09-02 (×6): qty 90

## 2018-09-02 MED ORDER — POTASSIUM CHLORIDE CRYS ER 20 MEQ PO TBCR
40.0000 meq | EXTENDED_RELEASE_TABLET | Freq: Every day | ORAL | Status: DC
  Administered 2018-09-02: 40 meq via ORAL
  Filled 2018-09-02: qty 2

## 2018-09-02 MED ORDER — ACETAMINOPHEN 325 MG PO TABS: 650.0000 mg | ORAL_TABLET | Freq: Four times a day (QID) | ORAL | Status: DC | PRN

## 2018-09-02 MED ORDER — ACETAMINOPHEN 650 MG RE SUPP: 650.0000 mg | Freq: Four times a day (QID) | RECTAL | Status: DC | PRN

## 2018-09-02 MED ORDER — FUROSEMIDE 40 MG PO TABS
40.0000 mg | ORAL_TABLET | Freq: Every day | ORAL | Status: DC
  Administered 2018-09-02 – 2018-09-04 (×3): 40 mg via ORAL
  Filled 2018-09-02 (×3): qty 1

## 2018-09-02 MED ORDER — ONDANSETRON HCL 4 MG/2ML IJ SOLN
4.0000 mg | Freq: Once | INTRAMUSCULAR | Status: AC
  Administered 2018-09-02: 4 mg via INTRAVENOUS
  Filled 2018-09-02: qty 2

## 2018-09-02 MED ORDER — MAGNESIUM SULFATE 2 GM/50ML IV SOLN
2.0000 g | Freq: Once | INTRAVENOUS | Status: AC
  Administered 2018-09-02: 2 g via INTRAVENOUS
  Filled 2018-09-02: qty 50

## 2018-09-02 MED ORDER — POTASSIUM CHLORIDE 10 MEQ/100ML IV SOLN
10.0000 meq | Freq: Once | INTRAVENOUS | Status: AC
  Administered 2018-09-02: 10 meq via INTRAVENOUS
  Filled 2018-09-02: qty 100

## 2018-09-02 MED ORDER — VITAMIN D (ERGOCALCIFEROL) 1.25 MG (50000 UNIT) PO CAPS
50000.0000 [IU] | ORAL_CAPSULE | ORAL | Status: DC
  Administered 2018-09-02: 50000 [IU] via ORAL
  Filled 2018-09-02: qty 1

## 2018-09-02 MED ORDER — ENSURE ENLIVE PO LIQD
237.0000 mL | Freq: Two times a day (BID) | ORAL | Status: DC
  Administered 2018-09-02 – 2018-09-04 (×4): 237 mL via ORAL

## 2018-09-02 NOTE — ED Provider Notes (Signed)
Emergency Department Provider Note   I have reviewed the triage vital signs and the nursing notes.   HISTORY  Chief Complaint Altered Mental Status   HPI Kenneth [name] is a 57 y.o. male who presents from facility and is not able to offer a lot of history secondary to altered mental status but states his been feeling bad for a few days.  EMS states that he is had vomiting for last few days and worsening confusion and altered mental status.  He is actually to rehabilitation facility at this point.  Patient states he does not know if he is now take his lactulose.  He was recently discharged about a week ago for hepatic encephalopathy.  Patient states he has some abdominal pain but thinks it might be from vomiting.  He is not oriented to time.  He knows where he is at, why is here and what his name is.  Level V Caveat 2/2 disorientation  Past Medical History:  Diagnosis Date  . Alcohol abuse   . Anxiety   . Arthritis    "never diagnosis"  . Blood clot in vein   . Cirrhosis (HCC)   . Depression   . Dyspnea    Due to ascites  . Esophageal varices (HCC) 09/02/2014  . H/O [facility] spotted fever    age 89 or 45  . Hernia, inguinal    right  . Hiatal hernia 09/02/2014  . Polysubstance abuse (HCC)   . Portal hypertension with esophageal varices (HCC) 09/02/2014  . Portal hypertensive gastropathy (HCC) 09/02/2014  . Thrombocytopenia (HCC) 09/02/2014  . Upper GI bleed 08/31/2014    Patient Active Problem List   Diagnosis Date Noted  . Acute liver failure with hepatic coma (HCC)   . Malnutrition of moderate degree 08/17/2018  . Accelerated junctional rhythm 08/17/2018  . Hypokalemia 08/16/2018  . Hypomagnesemia 08/16/2018  . Hepatic encephalopathy (HCC) 08/15/2018  . Peripheral neuropathy 08/02/2018  . Ascites due to alcoholic cirrhosis (HCC) 05/19/2018  . Depression 12/18/2016  . Testosterone deficiency in male 08/11/2016  . Inguinal hernia 02/28/2016  . Rectal bleed  01/24/2016  . Umbilical hernia 01/24/2016  . Ascites 01/24/2016  . Tobacco abuse 01/24/2016  . Gynecomastia, male 01/24/2016  . Esophageal varices (HCC) 09/02/2014  . Portal hypertension with esophageal varices (HCC) 09/02/2014  . Hiatal hernia 09/02/2014  . Leukopenia 09/02/2014  . Thrombocytopenia (HCC) 09/02/2014  . Alcoholic cirrhosis of liver with ascites [facility])     Past Surgical History:  Procedure Laterality Date  . ESOPHAGOGASTRODUODENOSCOPY Left 09/01/2014   Procedure: ESOPHAGOGASTRODUODENOSCOPY (EGD);  Surgeon: Charna Elizabeth, MD;  Location: [facility];  Service: Endoscopy;  Laterality: Left;  . EYE SURGERY Left    age 29  . IR PARACENTESIS  03/16/2018  . IR PARACENTESIS  03/29/2018  . IR PARACENTESIS  04/19/2018  . IR PARACENTESIS  04/27/2018  . IR PARACENTESIS  05/10/2018  . IR PARACENTESIS  05/19/2018  . IR PARACENTESIS  06/11/2018  . IR PARACENTESIS  06/29/2018  . IR PARACENTESIS  07/16/2018  . IR PARACENTESIS  08/03/2018  . IR PARACENTESIS  08/23/2018  . IR RADIOLOGIST EVAL & MGMT  05/13/2018  . IR RADIOLOGIST EVAL & MGMT  06/29/2018  . IR TIPS  05/19/2018  . RADIOLOGY WITH ANESTHESIA N/A 05/19/2018   Procedure: TIPS;  Surgeon: Simonne Come, MD;  Location: [facility] OR;  Service: Radiology;  Laterality: N/A;      Allergies Latex  Family History  Problem Relation Age of Onset  .  Leukemia Father        acute myloid   . Hyperlipidemia Father   . Hypertension Father   . Colon cancer Neg Hx   . Esophageal cancer Neg Hx   . Pancreatic cancer Neg Hx   . Stomach cancer Neg Hx   . Liver disease Neg Hx     Social History Social History   Tobacco Use  . Smoking status: Current Every Day Smoker    Packs/day: 0.50    Years: 10.00    Pack years: 5.00    Types: Cigarettes  . Smokeless tobacco: Never Used  Substance Use Topics  . Alcohol use: No    Comment: no alcohol since 01/23/16  . Drug use: Yes    Frequency: 5.0 times per week    Types: Marijuana    Review of  Systems  Level V Caveat 2/2 disorientation ____________________________________________   PHYSICAL EXAM:  VITAL SIGNS: ED Triage Vitals  Enc Vitals Group     BP 09/02/18 1024 128/75     Pulse Rate 09/02/18 1024 70     Resp 09/02/18 1024 15     Temp 09/02/18 1024 97.7 F (36.5 C)     Temp Source 09/02/18 1024 Oral     SpO2 09/02/18 1024 100 %    Constitutional: Alert but disoriented. Well appearing and in no acute distress. Eyes: Conjunctivae are icteric. PERRL. EOMI. Head: Atraumatic. Nose: No congestion/rhinnorhea. Mouth/Throat: Mucous membranes are moist.  Oropharynx non-erythematous. Neck: No stridor.  No meningeal signs.   Cardiovascular: Normal rate, regular rhythm. Good peripheral circulation. Grossly normal heart sounds.   Respiratory: Normal respiratory effort.  No retractions. Lungs CTAB. Gastrointestinal: Soft and nontender. No distention. Small reducible umbilical hernia Musculoskeletal: No lower extremity tenderness nor edema. No gross deformities of extremities. Neurologic:  Normal speech and language. No gross focal neurologic deficits are appreciated.  Skin:  Skin is warm, jaundice, dry and intact. No rash noted.   ____________________________________________   LABS (all labs ordered are listed, but only abnormal results are displayed)  Labs Reviewed  CBC WITH DIFFERENTIAL/PLATELET - Abnormal; Notable for the following components:      Result Value   WBC 3.6 (*)    RBC 3.94 (*)    Hemoglobin 11.9 (*)    HCT 37.1 (*)    RDW 16.8 (*)    Platelets 62 (*)    All other components within normal limits  COMPREHENSIVE METABOLIC PANEL - Abnormal; Notable for the following components:   Potassium 3.0 (*)    Calcium 8.5 (*)    Total Protein 6.4 (*)    Albumin 2.3 (*)    AST 107 (*)    ALT 70 (*)    Alkaline Phosphatase 152 (*)    Total Bilirubin 6.1 (*)    All other components within normal limits  AMMONIA - Abnormal; Notable for the following  components:   Ammonia 120 (*)    All other components within normal limits  LIPASE, BLOOD - Abnormal; Notable for the following components:   Lipase 116 (*)    All other components within normal limits  MAGNESIUM - Abnormal; Notable for the following components:   Magnesium 1.6 (*)    All other components within normal limits   ____________________________________________  EKG   EKG Interpretation  Date/Time:  Thursday September 02 2018 10:36:33 EST Ventricular Rate:  66 PR Interval:    QRS Duration: 94 QT Interval:  561 QTC Calculation: 588 R Axis:   64 Text Interpretation:  Sinus rhythm Short PR interval Minimal ST depression, diffuse leads Prolonged QT interval worsened QT compared to previously Confirmed by Marily Memos [phone]) on 09/02/2018 3:24:31 PM       ____________________________________________  RADIOLOGY  Dg Chest 2 View  Result Date: 09/02/2018 CLINICAL DATA:  Altered mental status, confusion EXAM: CHEST - 2 VIEW COMPARISON:  Chest x-ray of 08/16/2018 FINDINGS: No active infiltrate or effusion is seen. Mediastinal and hilar contours are stable and the heart is within normal limits in size. No acute bony abnormality is seen. IMPRESSION: No active cardiopulmonary disease. Electronically Signed   By: Dwyane Dee M.D.   On: 09/02/2018 11:05    ____________________________________________   PROCEDURES  Procedure(s) performed:   Procedures   ____________________________________________   INITIAL IMPRESSION / ASSESSMENT AND PLAN / ED COURSE  Unclear etiology at this point.  Suspect hepatic encephalopathy as he has been vomiting and probably not keeping his lactulose down.  EKG does show prolonged QT interval so we will need to keep that line when treating.  We will going give magnesium now in case we need to give QT prolonging medicines in the future.  K given as well. Found to have elevated ammonia. Likely hepatic encephalopathy. Admit to medicine.         Pertinent labs & imaging results that were available during my care of the patient were reviewed by me and considered in my medical decision making (see chart for details).  ____________________________________________  FINAL CLINICAL IMPRESSION(S) / ED DIAGNOSES  Final diagnoses:  Altered mental status, unspecified altered mental status type  Prolonged Q-T interval on ECG  Hypokalemia  Hypomagnesemia     MEDICATIONS GIVEN DURING THIS VISIT:  Medications  ondansetron (ZOFRAN) injection 4 mg (has no administration in time range)  potassium chloride 10 mEq in 100 mL IVPB (has no administration in time range)  magnesium sulfate IVPB 2 g 50 mL (has no administration in time range)  magnesium sulfate IVPB 2 g 50 mL (0 g Intravenous Stopped 09/02/18 1216)  lactulose (CHRONULAC) 10 GM/15ML solution 30 g (30 g Oral Given 09/02/18 1416)     NEW OUTPATIENT MEDICATIONS STARTED DURING THIS VISIT:  Current Discharge Medication List      Note:  This note was prepared with assistance of Dragon voice recognition software. Occasional wrong-word or sound-a-like substitutions may have occurred due to the inherent limitations of voice recognition software.   Marily Memos, MD 09/02/18 1525

## 2018-09-02 NOTE — ED Triage Notes (Addendum)
Pt from [facility] via ems; being treated for abdominal issues; called out for confusion; LKW 0700 this am; stroke screen neg; unable to answer questions appropriately w/ ems;  hx alcoholism; began vomiting en route; 4 mg zofran given PTA; pt prescribed lactulose, states he has missed a couple of doses  114/76 HR 75 CBG 122 RR 18-20

## 2018-09-02 NOTE — Progress Notes (Signed)
Pt admitted to the unit from ED; pt alert and verbally responsive; pt oriented to the unit and room; fall/safety precaution and education completed and pt voices understanding; VSS; telemetry applied and verified with CCMD; NT called to second verify. Pt skin intact with no opened wounds or pressure ulcer noted except for BUE ecchymosis; pt in bed with call light within reach and bed alarm on. Will continue to closely monitor. Arabella Merles Jesseca Marsch RN    09/02/18 1539  Vitals  Temp 97.7 F (36.5 C)  Temp Source Axillary  BP 106/74  MAP (mmHg) 84  BP Location Right Arm  BP Method Automatic  Patient Position (if appropriate) Lying  Pulse Rate 67  Pulse Rate Source Dinamap  Resp 18  Oxygen Therapy  SpO2 100 %  O2 Device Room Air  Pain Assessment  Pain Scale 0-10  Pain Score 0  Height and Weight  Height 5\' 7"  (1.702 m)  Weight 58.1 kg  Type of Scale Used Bed  Type of Weight Actual  BSA (Calculated - sq m) 1.66 sq meters  BMI (Calculated) 20.06  Weight in (lb) to have BMI = 25 159.3

## 2018-09-02 NOTE — H&P (Signed)
[facility]                                                                                    Patient Demographics  Kenneth [name], is a 57 y.o. male  CSN: [id number]  MRN: [id number]  DOB - 18-Jan-1961  Admit Date - 09/02/2018  Outpatient Primary MD for the patient is [name], MD   With History of -  Past Medical History:  Diagnosis Date  . Alcohol abuse   . Anxiety   . Arthritis    "never diagnosis"  . Blood clot in vein   . Cirrhosis (HCC)   . Depression   . Dyspnea    Due to ascites  . Esophageal varices (HCC) 09/02/2014  . H/O [facility] spotted fever    age 73 or 29  . Hernia, inguinal    right  . Hiatal hernia 09/02/2014  . Polysubstance abuse (HCC)   . Portal hypertension with esophageal varices (HCC) 09/02/2014  . Portal hypertensive gastropathy (HCC) 09/02/2014  . Thrombocytopenia (HCC) 09/02/2014  . Upper GI bleed 08/31/2014      Past Surgical History:  Procedure Laterality Date  . ESOPHAGOGASTRODUODENOSCOPY Left 09/01/2014   Procedure: ESOPHAGOGASTRODUODENOSCOPY (EGD);  Surgeon: Charna Elizabeth, MD;  Location: [facility];  Service: Endoscopy;  Laterality: Left;  . EYE SURGERY Left    age 47  . IR PARACENTESIS  03/16/2018  . IR PARACENTESIS  03/29/2018  . IR PARACENTESIS  04/19/2018  . IR PARACENTESIS  04/27/2018  . IR PARACENTESIS  05/10/2018  . IR PARACENTESIS  05/19/2018  . IR PARACENTESIS  06/11/2018  . IR PARACENTESIS  06/29/2018  . IR PARACENTESIS  07/16/2018  . IR PARACENTESIS  08/03/2018  . IR PARACENTESIS  08/23/2018  . IR RADIOLOGIST EVAL & MGMT  05/13/2018  . IR RADIOLOGIST EVAL & MGMT  06/29/2018  . IR TIPS  05/19/2018  . RADIOLOGY WITH ANESTHESIA N/A 05/19/2018   Procedure: TIPS;  Surgeon: Simonne Come, MD;  Location: [facility] OR;  Service: Radiology;  Laterality: N/A;    in for   Chief Complaint  Patient presents with  . Altered Mental Status     HPI  Kenneth [name]  is a 57 y.o. male, past medical history significant for  alcoholic cirrhosis with esophageal varices, ascites and hepatic encephalopathy sent today for evaluation from his facility for altered mental status and vomiting.  She was recently discharged from our facility for hepatic encephalopathy around 1 week ago he complains of abdominal discomfort, but says " it is going to be okay" .  Patient denies any fever or chills.  Denies chest pains or shortness of breath.  Denies any diarrhea.  Patient denies any recent alcohol intake.  In the emergency room his ammonia level was noted to be elevated and his lipase was elevated as well.    Review of Systems    In addition to the HPI above,  No Fever-chills, No Headache, No changes with Vision or hearing, No problems swallowing food or Liquids, No Chest pain, Cough or Shortness of Breath, No Blood in stool or Urine, No dysuria, No new skin rashes or bruises, No new joints pains-aches,  No new weakness, tingling, numbness in any extremity, No recent weight gain or loss, No polyuria, polydypsia or polyphagia, No significant Mental Stressors.  A full 10 point Review of Systems was done, except as stated above, all other Review of Systems were negative.   Social History Social History   Tobacco Use  . Smoking status: Current Every Day Smoker    Packs/day: 0.50    Years: 10.00    Pack years: 5.00    Types: Cigarettes  . Smokeless tobacco: Never Used  Substance Use Topics  . Alcohol use: No    Comment: no alcohol since 01/23/16     Family History Family History  Problem Relation Age of Onset  . Leukemia Father        acute myloid   . Hyperlipidemia Father   . Hypertension Father   . Colon cancer Neg Hx   . Esophageal cancer Neg Hx   . Pancreatic cancer Neg Hx   . Stomach cancer Neg Hx   . Liver disease Neg Hx      Prior to Admission medications   Medication Sig Start Date End Date Taking? Authorizing Provider  buPROPion (WELLBUTRIN SR) 100 MG 12 hr tablet Take 1 tablet (100 mg  total) by mouth daily. 08/26/18   Rodolph Bong, MD  diclofenac sodium (VOLTAREN) 1 % GEL Apply 4 g topically 4 (four) times daily. Patient not taking: Reported on 08/15/2018 12/15/17   [name], MD  ergocalciferol (DRISDOL) 50000 units capsule Take 1 capsule (50,000 Units total) by mouth once a week. 08/03/18   [name], MD  feeding supplement, ENSURE ENLIVE, (ENSURE ENLIVE) LIQD Take 237 mLs by mouth 2 (two) times daily between meals. 08/18/18   Calvert Cantor, MD  FLUoxetine (PROZAC) 20 MG capsule Take 1 capsule (20 mg total) by mouth daily. 08/27/18   Rodolph Bong, MD  furosemide (LASIX) 40 MG tablet Take 1 tablet (40 mg total) by mouth daily. 08/26/18   Rodolph Bong, MD  gabapentin (NEURONTIN) 300 MG capsule Take 1 capsule (300 mg total) by mouth at bedtime. 08/02/18   [name], MD  lactulose (CHRONULAC) 10 GM/15ML solution Take 67.5 mLs (45 g total) by mouth 3 (three) times daily. Increase to 4 tablespoons by mouth twice daily. 08/26/18   Rodolph Bong, MD  midodrine (PROAMATINE) 5 MG tablet Take 1 tablet (5 mg total) by mouth 3 (three) times daily with meals. 07/21/18   Esterwood, Amy S, PA-C  Multiple Vitamin (MULTIVITAMIN WITH MINERALS) TABS tablet Take 1 tablet by mouth daily. 08/18/18   Calvert Cantor, MD  nicotine (NICODERM CQ - DOSED IN MG/24 HOURS) 21 mg/24hr patch Place 1 patch (21 mg total) onto the skin daily. 08/27/18   Rodolph Bong, MD  ondansetron (ZOFRAN) 4 MG tablet Take 1 tablet (4 mg total) by mouth every 8 (eight) hours as needed for nausea or vomiting. 08/11/18   Sherrilyn Rist, MD  Potassium Chloride ER 20 MEQ TBCR Take 40 mEq by mouth daily. 08/17/18   Calvert Cantor, MD  rifaximin (XIFAXAN) 550 MG TABS tablet Take 1 tablet (550 mg total) by mouth 2 (two) times daily. 08/26/18   Rodolph Bong, MD  spironolactone (ALDACTONE) 100 MG tablet Take 1 tablet (100 mg total) by mouth daily. 08/27/18   Rodolph Bong, MD     Allergies  Allergen Reactions  . Latex Swelling and Rash    Physical Exam  Vitals  Blood pressure 110/67, pulse 66,  temperature 97.7 F (36.5 C), temperature source Oral, resp. rate 13, SpO2 100 %.   1. General chronically ill, very pleasant  2.  Depressed affect , Not Suicidal or Homicidal, Awake Alert, Oriented X 3.  3. No F.N deficits, grossly, patient moving all extremities.  4. Ears and Eyes appear Normal, Conjunctivae mildly jaundiced, PERRLA. Moist Oral Mucosa.  5. Supple Neck, No JVD, No cervical lymphadenopathy appriciated, No Carotid Bruits.  6. Symmetrical Chest wall movement, Good air movement bilaterally, CTAB.  7. RRR, No Gallops, Rubs or Murmurs, No Parasternal Heave.  8. Positive Bowel Sounds, Abdomen Soft, Non tender, fluid shift noted.  9.  No Cyanosis, Normal Skin Turgor, No Skin Rash or Bruise.  10. Good muscle tone,  joints appear normal , no effusions, Normal ROM.    Data Review  CBC Recent Labs  Lab 09/02/18 1040  WBC 3.6*  HGB 11.9*  HCT 37.1*  PLT 62*  MCV 94.2  MCH 30.2  MCHC 32.1  RDW 16.8*  LYMPHSABS 0.8  MONOABS 0.8  EOSABS 0.1  BASOSABS 0.0   ------------------------------------------------------------------------------------------------------------------  Chemistries  Recent Labs  Lab 09/02/18 1040  NA 139  K 3.0*  CL 104  CO2 26  GLUCOSE 98  BUN 8  CREATININE 0.75  CALCIUM 8.5*  MG 1.6*  AST 107*  ALT 70*  ALKPHOS 152*  BILITOT 6.1*   ------------------------------------------------------------------------------------------------------------------ CrCl cannot be calculated (Unknown ideal weight.). ------------------------------------------------------------------------------------------------------------------ No results for input(s): TSH, T4TOTAL, T3FREE, THYROIDAB in the last 72 hours.  Invalid input(s): FREET3   Coagulation profile No results for input(s): INR, PROTIME in the last 168  hours. ------------------------------------------------------------------------------------------------------------------- No results for input(s): DDIMER in the last 72 hours. -------------------------------------------------------------------------------------------------------------------  Cardiac Enzymes No results for input(s): CKMB, TROPONINI, MYOGLOBIN in the last 168 hours.  Invalid input(s): CK ------------------------------------------------------------------------------------------------------------------ Invalid input(s): POCBNP   ---------------------------------------------------------------------------------------------------------------  Urinalysis    Component Value Date/Time   COLORURINE AMBER (A) 08/22/2018 1026   APPEARANCEUR CLOUDY (A) 08/22/2018 1026   LABSPEC 1.013 08/22/2018 1026   PHURINE 8.0 08/22/2018 1026   GLUCOSEU NEGATIVE 08/22/2018 1026   HGBUR NEGATIVE 08/22/2018 1026   BILIRUBINUR SMALL (A) 08/22/2018 1026   BILIRUBINUR negative 02/25/2018 1121   KETONESUR 5 (A) 08/22/2018 1026   PROTEINUR NEGATIVE 08/22/2018 1026   UROBILINOGEN 0.2 02/25/2018 1121   UROBILINOGEN 1.0 04/08/2009 1430   NITRITE NEGATIVE 08/22/2018 1026   LEUKOCYTESUR NEGATIVE 08/22/2018 1026    ----------------------------------------------------------------------------------------------------------------   Imaging results:   Dg Chest 2 View  Result Date: 09/02/2018 CLINICAL DATA:  Altered mental status, confusion EXAM: CHEST - 2 VIEW COMPARISON:  Chest x-ray of 08/16/2018 FINDINGS: No active infiltrate or effusion is seen. Mediastinal and hilar contours are stable and the heart is within normal limits in size. No acute bony abnormality is seen. IMPRESSION: No active cardiopulmonary disease. Electronically Signed   By: Dwyane Dee M.D.   On: 09/02/2018 11:05   Dg Abd Acute W/chest  Result Date: 08/16/2018 CLINICAL DATA:  Abdominal pain. EXAM: DG ABDOMEN ACUTE W/ 1V CHEST  COMPARISON:  CT 04/26/2018 FINDINGS: Linear subsegmental atelectasis at the left lung base. No confluent consolidation. No pleural fluid. No free intra-abdominal air. No bowel dilatation to suggest obstruction. Air-filled small bowel in the right abdomen is nondilated and appears similar to prior CT. Moderate colonic stool burden with stool in the rectum. Calcified gallstones. TIPS in the right upper quadrant. Pelvic calcifications consistent with phleboliths. IMPRESSION: 1. No bowel obstruction or free air.  Moderate colonic stool burden. 2.  Gallstones. 3. Subsegmental atelectasis at the left lung base. Electronically Signed   By: Narda Rutherford M.D.   On: 08/16/2018 06:06   Ir Paracentesis  Result Date: 08/23/2018 INDICATION: Patient with history of end-stage liver disease/cirrhosis with prior TIPS, recurrent ascites; request made for diagnostic and therapeutic paracentesis. EXAM: ULTRASOUND GUIDED DIAGNOSTIC AND THERAPEUTIC PARACENTESIS MEDICATIONS: None COMPLICATIONS: None immediate. PROCEDURE: Informed written consent was obtained from the patient after a discussion of the risks, benefits and alternatives to treatment. A timeout was performed prior to the initiation of the procedure. Initial ultrasound scanning demonstrates a small amount of ascites within the left lower abdominal quadrant. The left lower abdomen was prepped and draped in the usual sterile fashion. 1% lidocaine was used for local anesthesia. Following this, a 19 gauge, 7-cm, Yueh catheter was introduced. An ultrasound image was saved for documentation purposes. The paracentesis was performed. The catheter was removed and a dressing was applied. The patient tolerated the procedure well without immediate post procedural complication. FINDINGS: A total of approximately 750 cc of yellow fluid was removed. Samples were sent to the laboratory as requested by the clinical team. IMPRESSION: Successful ultrasound-guided diagnostic and therapeutic  paracentesis yielding 750 cc of peritoneal fluid. Read by: Jeananne Rama, PA-C Electronically Signed   By: Malachy Moan M.D.   On: 08/23/2018 13:01    My personal review of EKG: Rhythm NSR, 66 bpm nonspecific ST changes  Personally reviewed Old Chart from last week  Assessment & Plan  1.  Hepatic encephalopathy; continue with lactulose      Recheck ammonia level in a.m.  2.  Hypokalemia and hypomagnesemia; continue to replete      Recheck in a.m.  3.  Prolonged QT; avoid cardiotoxins      Full EKG in a.m.  4.  History of alcoholic cirrhosis with esophageal varices and ascites on Lasix and Aldactone  5.  Pancreatitis; monitor lipase  6.  History of depression; will hold his medications due to altered mental status      Resume if mental status improves  7.  History of malnutrition; will benefit from nutrition consult  8.  Thrombocytopenia ; probably secondary to liver abnormalities      Recheck platelets   DVT SCDs  AM Labs Ordered, also please review Full Orders    Code Status full  Disposition Plan: Back to facility  Time spent in minutes : 41 minutes  Condition GUARDED   @SIGNATURE @

## 2018-09-02 NOTE — ED Notes (Signed)
Patient transported to X-ray 

## 2018-09-03 ENCOUNTER — Inpatient Hospital Stay (HOSPITAL_COMMUNITY): Payer: Medicaid Other

## 2018-09-03 DIAGNOSIS — I8500 Esophageal varices without bleeding: Secondary | ICD-10-CM

## 2018-09-03 LAB — LIPASE, BLOOD: LIPASE: 138 U/L — AB (ref 11–51)

## 2018-09-03 LAB — PROTIME-INR
INR: 2.2
Prothrombin Time: 24.1 seconds — ABNORMAL HIGH (ref 11.4–15.2)

## 2018-09-03 LAB — COMPREHENSIVE METABOLIC PANEL
ALT: 61 U/L — ABNORMAL HIGH (ref 0–44)
ANION GAP: 5 (ref 5–15)
AST: 85 U/L — ABNORMAL HIGH (ref 15–41)
Albumin: 2.1 g/dL — ABNORMAL LOW (ref 3.5–5.0)
Alkaline Phosphatase: 142 U/L — ABNORMAL HIGH (ref 38–126)
BUN: 7 mg/dL (ref 6–20)
CHLORIDE: 108 mmol/L (ref 98–111)
CO2: 28 mmol/L (ref 22–32)
Calcium: 9.1 mg/dL (ref 8.9–10.3)
Creatinine, Ser: 0.69 mg/dL (ref 0.61–1.24)
Glucose, Bld: 102 mg/dL — ABNORMAL HIGH (ref 70–99)
Potassium: 2.7 mmol/L — CL (ref 3.5–5.1)
Sodium: 141 mmol/L (ref 135–145)
Total Bilirubin: 5.3 mg/dL — ABNORMAL HIGH (ref 0.3–1.2)
Total Protein: 5.8 g/dL — ABNORMAL LOW (ref 6.5–8.1)

## 2018-09-03 LAB — MAGNESIUM: MAGNESIUM: 1.7 mg/dL (ref 1.7–2.4)

## 2018-09-03 LAB — AMMONIA: AMMONIA: 34 umol/L (ref 9–35)

## 2018-09-03 MED ORDER — POTASSIUM CHLORIDE 10 MEQ/100ML IV SOLN
10.0000 meq | INTRAVENOUS | Status: AC
  Administered 2018-09-03 (×3): 10 meq via INTRAVENOUS
  Filled 2018-09-03 (×3): qty 100

## 2018-09-03 MED ORDER — POTASSIUM CHLORIDE CRYS ER 20 MEQ PO TBCR
40.0000 meq | EXTENDED_RELEASE_TABLET | Freq: Every day | ORAL | Status: DC
  Administered 2018-09-03 – 2018-09-04 (×2): 40 meq via ORAL
  Filled 2018-09-03 (×2): qty 2

## 2018-09-03 NOTE — Progress Notes (Signed)
[facility]                                                                              Patient Demographics  Kenneth [name], is a 57 y.o. male, DOB - October 14, 1961, ZOX:[id number]  Admit date - 09/02/2018   Admitting Physician Carron Curie, MD  Outpatient Primary MD for the patient is [name], MD  Outpatient specialists:   LOS - 1  days   Medical records reviewed and are as summarized below:    Chief Complaint  Patient presents with  . Altered Mental Status       Brief summary   Patient is a 57 year old male with history of alcoholic cirrhosis, esophageal varices, ascites, hepatic encephalopathy recently discharged on 10/31 when he presented with similar presentation.  Patient presented from skilled nursing facility with altered mental status and vomiting.  Patient reported abdominal discomfort a week ago, no fevers or chills, no chest pain or shortness of breath.  In ED ammonia level was elevated 120, lipase level was elevated 116.  Patient was admitted for further work-up   Assessment & Plan    Principal Problem: Acute hepatic encephalopathy (HCC) -Ammonia level elevated 120 at the time of admission, continue lactulose, rifaximin -Ammonia level improved to 34 this morning, much more alert and awake, coherent  Active Problems: Nausea, vomiting with abdominal pain/acute pancreatic -In the setting of liver cirrhosis, lipase was however elevated 116, worsened to 138 today with transaminitis, ?  Acute pancreatitis -Obtain abdominal ultrasound to rule out gallstones, ascites.  Last paracentesis on 10/28 -For now continue clear liquid diet    Esophageal varices (HCC), secondary to portal hypertension from alcoholic cirrhosis of the liver with ascites -H&H stable, no bleeding -Continue Lasix, Aldactone with potassium replacement Patient was followed by GI and palliative care during the previous admission, recommended follow-up with GI in 2 weeks,  paracentesis in 1 to 2 weeks    Thrombocytopenia (HCC) -Due to alcoholic liver disease, platelets currently at baseline  Severe hypokalemia and hypomagnesemia -Potassium still 2.7, will replace IV and p.o. -Magnesium replaced    Malnutrition of moderate degree Nutritional consult  Prolonged QTC QTC 588 on admission, improved to 489 per EKG today Continue potassium replacement, keep potassium~4, Mg ~2   Code Status: Full code DVT Prophylaxis: SCDs Family Communication: Discussed in detail with the patient, all imaging results, lab results explained to the patient    Disposition Plan: *  Time Spent in minutes 35 minutes  Procedures:  None  Consultants:   None  Antimicrobials:      Medications  Scheduled Meds: . feeding supplement  1 Container Oral TID BM  . feeding supplement (ENSURE ENLIVE)  237 mL Oral BID BM  . furosemide  40 mg Oral Daily  . lactulose  45 g Oral TID  . multivitamin with minerals  1 tablet Oral Daily  . nicotine  21 mg Transdermal Daily  . potassium chloride SA  40 mEq Oral Daily  . rifaximin  550 mg Oral BID  . spironolactone  100 mg Oral Daily  . Vitamin D (Ergocalciferol)  50,000 Units Oral Weekly   Continuous Infusions: .  dextrose 5 % and 0.45% NaCl Stopped (09/03/18 0305)  . potassium chloride 10 mEq (09/03/18 1032)   PRN Meds:.acetaminophen **OR** acetaminophen, HYDROcodone-acetaminophen, zolpidem   Antibiotics   Anti-infectives (From admission, onward)   Start     Dose/Rate Route Frequency Ordered Stop   09/02/18 1545  rifaximin (XIFAXAN) tablet 550 mg     550 mg Oral 2 times daily 09/02/18 1532          Subjective:   Guage Efferson was seen and examined today.  Still somewhat confused, no vomiting, states mild pain in the right upper quadrant on deep palpation. Patient denies dizziness, chest pain, shortness of breath, abdominal pain, N/V.  Objective:   Vitals:   09/02/18 1932 09/02/18 2352 09/03/18 0323 09/03/18 0822   BP: 112/68 103/64 (!) 116/55 109/73  Pulse: 75 80 78 70  Resp: 18 18 18 17   Temp: 98 F (36.7 C) 98.1 F (36.7 C) 98 F (36.7 C) 98 F (36.7 C)  TempSrc: Oral Oral Oral Oral  SpO2: 100% 100% 100% 96%  Weight:      Height:        Intake/Output Summary (Last 24 hours) at 09/03/2018 1056 Last data filed at 09/02/2018 2302 Gross per 24 hour  Intake 904.1 ml  Output 175 ml  Net 729.1 ml     Wt Readings from Last 3 Encounters:  09/02/18 58.1 kg  08/15/18 61.4 kg  08/11/18 65.9 kg     Exam  General: Alert and oriented to self, place, pleasant  Eyes:  HEENT:  Atraumatic, normocephalic  Cardiovascular: S1 S2 auscultated,  Regular rate and rhythm.  Respiratory: CTAB  Gastrointestinal: Soft, nontender, milddistended, + bowel sounds  Ext: no pedal edema bilaterally  Neuro: moving all 4 extremities  Musculoskeletal: No digital cyanosis, clubbing  Skin: No rashes  Psych: Normal affect and demeanor, alert and oriented x3    Data Reviewed:  I have personally reviewed following labs and imaging studies  Micro Results Recent Results (from the past 240 hour(s))  MRSA PCR Screening     Status: None   Collection Time: 09/02/18  3:32 PM  Result Value Ref Range Status   MRSA by PCR NEGATIVE NEGATIVE Final    Comment:        The GeneXpert MRSA Assay (FDA approved for NASAL specimens only), is one component of a comprehensive MRSA colonization surveillance program. It is not intended to diagnose MRSA infection nor to guide or monitor treatment for MRSA infections. Performed at [facility], [address] [place], [place] [address]     Radiology Reports Dg Chest 2 View  Result Date: 09/02/2018 CLINICAL DATA:  Altered mental status, confusion EXAM: CHEST - 2 VIEW COMPARISON:  Chest x-ray of 08/16/2018 FINDINGS: No active infiltrate or effusion is seen. Mediastinal and hilar contours are stable and the heart is within normal limits in size. No acute bony  abnormality is seen. IMPRESSION: No active cardiopulmonary disease. Electronically Signed   By: Dwyane Dee M.D.   On: 09/02/2018 11:05   Dg Abd Acute W/chest  Result Date: 08/16/2018 CLINICAL DATA:  Abdominal pain. EXAM: DG ABDOMEN ACUTE W/ 1V CHEST COMPARISON:  CT 04/26/2018 FINDINGS: Linear subsegmental atelectasis at the left lung base. No confluent consolidation. No pleural fluid. No free intra-abdominal air. No bowel dilatation to suggest obstruction. Air-filled small bowel in the right abdomen is nondilated and appears similar to prior CT. Moderate colonic stool burden with stool in the rectum. Calcified gallstones. TIPS in the  right upper quadrant. Pelvic calcifications consistent with phleboliths. IMPRESSION: 1. No bowel obstruction or free air.  Moderate colonic stool burden. 2. Gallstones. 3. Subsegmental atelectasis at the left lung base. Electronically Signed   By: Narda Rutherford M.D.   On: 08/16/2018 06:06   Ir Paracentesis  Result Date: 08/23/2018 INDICATION: Patient with history of end-stage liver disease/cirrhosis with prior TIPS, recurrent ascites; request made for diagnostic and therapeutic paracentesis. EXAM: ULTRASOUND GUIDED DIAGNOSTIC AND THERAPEUTIC PARACENTESIS MEDICATIONS: None COMPLICATIONS: None immediate. PROCEDURE: Informed written consent was obtained from the patient after a discussion of the risks, benefits and alternatives to treatment. A timeout was performed prior to the initiation of the procedure. Initial ultrasound scanning demonstrates a small amount of ascites within the left lower abdominal quadrant. The left lower abdomen was prepped and draped in the usual sterile fashion. 1% lidocaine was used for local anesthesia. Following this, a 19 gauge, 7-cm, Yueh catheter was introduced. An ultrasound image was saved for documentation purposes. The paracentesis was performed. The catheter was removed and a dressing was applied. The patient tolerated the procedure well  without immediate post procedural complication. FINDINGS: A total of approximately 750 cc of yellow fluid was removed. Samples were sent to the laboratory as requested by the clinical team. IMPRESSION: Successful ultrasound-guided diagnostic and therapeutic paracentesis yielding 750 cc of peritoneal fluid. Read by: Jeananne Rama, PA-C Electronically Signed   By: Malachy Moan M.D.   On: 08/23/2018 13:01    Lab Data:  CBC: Recent Labs  Lab 09/02/18 1040  WBC 3.6*  NEUTROABS 1.9  HGB 11.9*  HCT 37.1*  MCV 94.2  PLT 62*   Basic Metabolic Panel: Recent Labs  Lab 09/02/18 1040 09/03/18 0411 09/03/18 0837  NA 139 141  --   K 3.0* 2.7*  --   CL 104 108  --   CO2 26 28  --   GLUCOSE 98 102*  --   BUN 8 7  --   CREATININE 0.75 0.69  --   CALCIUM 8.5* 9.1  --   MG 1.6*  --  1.7   GFR: Estimated Creatinine Clearance: 83.7 mL/min (by C-G formula based on SCr of 0.69 mg/dL). Liver Function Tests: Recent Labs  Lab 09/02/18 1040 09/03/18 0411  AST 107* 85*  ALT 70* 61*  ALKPHOS 152* 142*  BILITOT 6.1* 5.3*  PROT 6.4* 5.8*  ALBUMIN 2.3* 2.1*   Recent Labs  Lab 09/02/18 1040 09/03/18 0411  LIPASE 116* 138*   Recent Labs  Lab 09/02/18 1033 09/03/18 0411  AMMONIA 120* 34   Coagulation Profile: Recent Labs  Lab 09/03/18 0411  INR 2.20   Cardiac Enzymes: No results for input(s): CKTOTAL, CKMB, CKMBINDEX, TROPONINI in the last 168 hours. BNP (last 3 results) No results for input(s): PROBNP in the last 8760 hours. HbA1C: No results for input(s): HGBA1C in the last 72 hours. CBG: No results for input(s): GLUCAP in the last 168 hours. Lipid Profile: No results for input(s): CHOL, HDL, LDLCALC, TRIG, CHOLHDL, LDLDIRECT in the last 72 hours. Thyroid Function Tests: No results for input(s): TSH, T4TOTAL, FREET4, T3FREE, THYROIDAB in the last 72 hours. Anemia Panel: No results for input(s): VITAMINB12, FOLATE, FERRITIN, TIBC, IRON, RETICCTPCT in the last 72  hours. Urine analysis:    Component Value Date/Time   COLORURINE AMBER (A) 08/22/2018 1026   APPEARANCEUR CLOUDY (A) 08/22/2018 1026   LABSPEC 1.013 08/22/2018 1026   PHURINE 8.0 08/22/2018 1026   GLUCOSEU NEGATIVE 08/22/2018 1026   HGBUR  NEGATIVE 08/22/2018 1026   BILIRUBINUR SMALL (A) 08/22/2018 1026   BILIRUBINUR negative 02/25/2018 1121   KETONESUR 5 (A) 08/22/2018 1026   PROTEINUR NEGATIVE 08/22/2018 1026   UROBILINOGEN 0.2 02/25/2018 1121   UROBILINOGEN 1.0 04/08/2009 1430   NITRITE NEGATIVE 08/22/2018 1026   LEUKOCYTESUR NEGATIVE 08/22/2018 1026     Balraj Brayfield M.D. [facility] 09/03/2018, 10:56 AM  Pager: [phone] Between 7am to 7pm - call Pager - [phone]  After 7pm go to [email or web address] - password [email or web address]  Call night coverage person covering after 7pm

## 2018-09-03 NOTE — Progress Notes (Signed)
CSW consulted for patient to return to [facility] when stable. Per chart review, patient recent readmit, discharged to [facility] under LOG. Patient will have to discharge back to [facility] if SNF is still appropriate at [place], as no other LOG facility was located for the patient on previous admission.   CSW confirmed that [facility] can still take patient when medically stable. CSW to follow.  Blenda Nicely, [place] Clinical Social Worker [phone]

## 2018-09-03 NOTE — Progress Notes (Signed)
Initial Nutrition Assessment  DOCUMENTATION CODES:   Not applicable  INTERVENTION:  Continue Boost Breeze po TID, each supplement provides 250 kcal and 9 grams of protein.  Encourage adequate PO intake.   NUTRITION DIAGNOSIS:   Increased nutrient needs related to chronic illness(cirrhosis) as evidenced by estimated needs.  GOAL:   Patient will meet greater than or equal to 90% of their needs  MONITOR:   PO intake, Supplement acceptance, Diet advancement, Weight trends, Labs, Skin, I & O's  REASON FOR ASSESSMENT:   Malnutrition Screening Tool    ASSESSMENT:   57 year old male with history of alcoholic cirrhosis, esophageal varices, ascites, hepatic encephalopathy recently discharged on 10/31 when he presented with similar presentation.  Patient presented from skilled nursing facility with altered mental status and vomiting. In ED ammonia level was elevated 120, lipase level was elevated 116.  Pt asleep during time of visit and did not awaken to RD assessment. RD unable to obtain most recent nutrition history. Pt is currently on a clear liquid diet. Pt currently has Boost Breeze ordered. Will continue to current orders to aid in caloric and protein needs. Recommend providing Ensure once diet advanced.   Unable to complete Nutrition-Focused physical exam at this time. RD to perform physical exam at next visit.   Labs and medications reviewed.   Diet Order:   Diet Order            Diet clear liquid Room service appropriate? Yes; Fluid consistency: Thin  Diet effective now              EDUCATION NEEDS:   Not appropriate for education at this time  Skin:  Skin Assessment: Reviewed RN Assessment  Last BM:  11/5  Height:   Ht Readings from Last 1 Encounters:  09/02/18 5\' 7"  (1.702 m)    Weight:   Wt Readings from Last 1 Encounters:  09/02/18 58.1 kg    Ideal Body Weight:  67.27 kg  BMI:  Body mass index is 20.06 kg/m.  Estimated Nutritional Needs:    Kcal:  1850-2050  Protein:  90-100 grams  Fluid:  >/= 1.8 L/day    Roslyn Smiling, MS, RD, LDN Pager # [phone] After hours/ weekend pager # [phone]

## 2018-09-03 NOTE — Care Management Note (Signed)
Case Management Note  Patient Details  Name: Kenneth [name] MRN: [id number] Date of Birth: [birth year]  Subjective/Objective:    Pt in with hepatic encephalopathy. He has been at [facility].  PCP: [facility]                Action/Plan: Current plan is for patient to return to [facility] at d/c. CM following.  Expected Discharge Date:                  Expected Discharge Plan:  Skilled Nursing Facility  In-House Referral:  Clinical Social Work  Discharge planning Services     Post Acute Care Choice:    Choice offered to:     DME Arranged:    DME Agency:     HH Arranged:    HH Agency:     Status of Service:  In process, will continue to follow  If discussed at Long Length of Stay Meetings, dates discussed:    Additional Comments:  Kermit Balo, RN 09/03/2018, 3:41 PM

## 2018-09-03 NOTE — NC FL2 (Signed)
[place] MEDICAID FL2 LEVEL OF CARE SCREENING TOOL     IDENTIFICATION  Patient Name: Kenneth [name] Birthdate: [birth year] Sex: male Admission Date (Current Location): 09/02/2018  [facility] and [place] Number:  Producer, television/film/video and Address:  [facility] [place], [address], [place], [place] [address]      Provider Number: [id number]  Attending Physician Name and Address:  Cathren Harsh, MD  Relative Name and Phone Number:       Current Level of Care: Hospital Recommended Level of Care: Skilled Nursing Facility Prior Approval Number:    Date Approved/Denied:   PASRR Number:    Discharge Plan: SNF    Current Diagnoses: Patient Active Problem List   Diagnosis Date Noted  . Acute liver failure with hepatic coma (HCC)   . Malnutrition of moderate degree 08/17/2018  . Accelerated junctional rhythm 08/17/2018  . Hypokalemia 08/16/2018  . Hypomagnesemia 08/16/2018  . Hepatic encephalopathy (HCC) 08/15/2018  . Peripheral neuropathy 08/02/2018  . Ascites due to alcoholic cirrhosis (HCC) 05/19/2018  . Depression 12/18/2016  . Testosterone deficiency in male 08/11/2016  . Inguinal hernia 02/28/2016  . Rectal bleed 01/24/2016  . Umbilical hernia 01/24/2016  . Ascites 01/24/2016  . Tobacco abuse 01/24/2016  . Gynecomastia, male 01/24/2016  . Esophageal varices (HCC) 09/02/2014  . Portal hypertension with esophageal varices (HCC) 09/02/2014  . Hiatal hernia 09/02/2014  . Leukopenia 09/02/2014  . Thrombocytopenia (HCC) 09/02/2014  . Alcoholic cirrhosis of liver with ascites (HCC)     Orientation RESPIRATION BLADDER Height & Weight     Self  Normal Continent Weight: 128 lb 1.4 oz (58.1 kg) Height:  5\' 7"  (170.2 cm)  BEHAVIORAL SYMPTOMS/MOOD NEUROLOGICAL BOWEL NUTRITION STATUS      Incontinent Diet(see DC summary)  AMBULATORY STATUS COMMUNICATION OF NEEDS Skin   Limited Assist Verbally Normal                       Personal Care  Assistance Level of Assistance  Bathing, Feeding, Dressing Bathing Assistance: Limited assistance Feeding assistance: Limited assistance Dressing Assistance: Limited assistance     Functional Limitations Info  Sight, Hearing, Speech Sight Info: Adequate Hearing Info: Adequate Speech Info: Adequate    SPECIAL CARE FACTORS FREQUENCY  PT (By licensed PT), OT (By licensed OT)     PT Frequency: 5x/wk OT Frequency: 5x/wk            Contractures Contractures Info: Not present    Additional Factors Info  Code Status, Allergies Code Status Info: Full Allergies Info: Latex           Current Medications (09/03/2018):  This is the current hospital active medication list Current Facility-Administered Medications  Medication Dose Route Frequency Provider Last Rate Last Dose  . acetaminophen (TYLENOL) tablet 650 mg  650 mg Oral Q6H PRN Carron Curie, MD       Or  . acetaminophen (TYLENOL) suppository 650 mg  650 mg Rectal Q6H PRN Carron Curie, MD      . dextrose 5 %-0.45 % sodium chloride infusion   Intravenous Continuous Carron Curie, MD   Stopped at 09/03/18 0305  . feeding supplement (BOOST / RESOURCE BREEZE) liquid 1 Container  1 Container Oral TID BM Carron Curie, MD   1 Container at 09/03/18 1037  . feeding supplement (ENSURE ENLIVE) (ENSURE ENLIVE) liquid 237 mL  237 mL Oral BID BM Carron Curie, MD   237 mL at 09/03/18 1035  . furosemide (  LASIX) tablet 40 mg  40 mg Oral Daily Carron Curie, MD   40 mg at 09/03/18 1038  . HYDROcodone-acetaminophen (NORCO/VICODIN) 5-325 MG per tablet 1-2 tablet  1-2 tablet Oral Q4H PRN Carron Curie, MD      . lactulose (CHRONULAC) 10 GM/15ML solution 45 g  45 g Oral TID Carron Curie, MD   45 g at 09/03/18 1036  . multivitamin with minerals tablet 1 tablet  1 tablet Oral Daily Carron Curie, MD   1 tablet at 09/03/18 1038  . nicotine (NICODERM CQ - dosed in mg/24 hours) patch 21 mg  21 mg Transdermal Daily Carron Curie, MD   21 mg at 09/03/18 1034  . potassium  chloride 10 mEq in 100 mL IVPB  10 mEq Intravenous Q1 Hr x 3 Rai, Ripudeep K, MD 100 mL/hr at 09/03/18 1131 10 mEq at 09/03/18 1131  . potassium chloride SA (K-DUR,KLOR-CON) CR tablet 40 mEq  40 mEq Oral Daily Rai, Ripudeep K, MD   40 mEq at 09/03/18 1037  . rifaximin (XIFAXAN) tablet 550 mg  550 mg Oral BID Carron Curie, MD   550 mg at 09/03/18 1038  . spironolactone (ALDACTONE) tablet 100 mg  100 mg Oral Daily Carron Curie, MD   100 mg at 09/03/18 1037  . Vitamin D (Ergocalciferol) (DRISDOL) capsule 50,000 Units  50,000 Units Oral Weekly Carron Curie, MD   50,000 Units at 09/02/18 1658  . zolpidem (AMBIEN) tablet 5 mg  5 mg Oral QHS PRN,MR X 1 Carron Curie, MD         Discharge Medications: Please see discharge summary for a list of discharge medications.  Relevant Imaging Results:  Relevant Lab Results:   Additional Information SS#: [id number]  Baldemar Lenis, LCSW

## 2018-09-04 DIAGNOSIS — K7031 Alcoholic cirrhosis of liver with ascites: Secondary | ICD-10-CM

## 2018-09-04 DIAGNOSIS — R4182 Altered mental status, unspecified: Secondary | ICD-10-CM

## 2018-09-04 DIAGNOSIS — K7290 Hepatic failure, unspecified without coma: Secondary | ICD-10-CM

## 2018-09-04 DIAGNOSIS — R410 Disorientation, unspecified: Secondary | ICD-10-CM

## 2018-09-04 LAB — COMPREHENSIVE METABOLIC PANEL
ALBUMIN: 2 g/dL — AB (ref 3.5–5.0)
ALT: 59 U/L — ABNORMAL HIGH (ref 0–44)
AST: 78 U/L — ABNORMAL HIGH (ref 15–41)
Alkaline Phosphatase: 132 U/L — ABNORMAL HIGH (ref 38–126)
Anion gap: 4 — ABNORMAL LOW (ref 5–15)
BILIRUBIN TOTAL: 5.2 mg/dL — AB (ref 0.3–1.2)
CALCIUM: 8.6 mg/dL — AB (ref 8.9–10.3)
CO2: 26 mmol/L (ref 22–32)
CREATININE: 0.71 mg/dL (ref 0.61–1.24)
Chloride: 105 mmol/L (ref 98–111)
GFR calc Af Amer: >60 mL/min (ref >60)
Glucose, Bld: 89 mg/dL (ref 70–99)
Potassium: 3.5 mmol/L (ref 3.5–5.1)
Sodium: 135 mmol/L (ref 135–145)
TOTAL PROTEIN: 5.4 g/dL — AB (ref 6.5–8.1)

## 2018-09-04 LAB — AMMONIA: AMMONIA: 27 umol/L (ref 9–35)

## 2018-09-04 LAB — LIPASE, BLOOD: LIPASE: 133 U/L — AB (ref 11–51)

## 2018-09-04 MED ORDER — MIDODRINE HCL 5 MG PO TABS: 2.5000 mg | ORAL_TABLET | Freq: Three times a day (TID) | ORAL | 1 refills | Status: DC

## 2018-09-04 MED ORDER — FUROSEMIDE 40 MG PO TABS: 40.0000 mg | ORAL_TABLET | Freq: Every day | ORAL | 1 refills | Status: DC

## 2018-09-04 MED ORDER — RIFAXIMIN 550 MG PO TABS: 550.0000 mg | ORAL_TABLET | Freq: Two times a day (BID) | ORAL | 1 refills | Status: DC

## 2018-09-04 MED ORDER — LACTULOSE 10 GM/15ML PO SOLN: 45.0000 g | Freq: Three times a day (TID) | ORAL | 1 refills | Status: DC

## 2018-09-04 NOTE — Clinical Social Work Placement (Signed)
   CLINICAL SOCIAL WORK PLACEMENT  NOTE  Date:  09/04/2018  Patient Details  Name: Kenneth [name] MRN: [id number] Date of Birth: 08/13/1961  Clinical Social Work is seeking post-discharge placement for this patient at the Skilled  Nursing Facility level of care (*CSW will initial, date and re-position this form in  chart as items are completed):  Yes   Patient/family provided with [facility] Clinical Social Work Department's list of facilities offering this level of care within the geographic area requested by the patient (or if unable, by the patient's family).  Yes   Patient/family informed of their freedom to choose among providers that offer the needed level of care, that participate in Medicare, Medicaid or managed care program needed by the patient, have an available bed and are willing to accept the patient.      Patient/family informed of [facility]'s ownership interest in [facility] and [facility], as well as of the fact that they are under no obligation to receive care at these facilities.  PASRR submitted to EDS on       PASRR number received on 09/03/18     Existing PASRR number confirmed on       FL2 transmitted to all facilities in geographic area requested by pt/family on 09/03/18     FL2 transmitted to all facilities within larger geographic area on       Patient informed that his/her managed care company has contracts with or will negotiate with certain facilities, including the following:        Yes   Patient/family informed of bed offers received.  Patient chooses bed at [facility]     Physician recommends and patient chooses bed at      Patient to be transferred to [facility] on 09/04/18.  Patient to be transferred to facility by Family members     Patient family notified on 09/04/18 of transfer.  Name of family member notified:  son [name]     PHYSICIAN       Additional Comment:     _______________________________________________ Gildardo Griffes, LCSW 09/04/2018, 11:59 AM

## 2018-09-04 NOTE — Discharge Summary (Signed)
Physician Discharge Summary   Patient ID: Kenneth [name] MRN: [id number] DOB/AGE: 57-Feb-1962 57 y.o.  Admit date: 09/02/2018 Discharge date: 09/04/2018  Primary Care Physician:  [name], MD   Recommendations for Outpatient Follow-up:  1. Follow up with PCP in 1-2 weeks 2. Patient has GI follow-up on 09/08/2018, please make sure patient keeps appointment  Home Health: Patient returning to skilled nursing facility Equipment/Devices:   Discharge Condition: stable  CODE STATUS: FULL Diet recommendation: Heart healthy diet   Discharge Diagnoses:    . Acute hepatic encephalopathy (HCC) . Alcoholic cirrhosis of liver with ascites (HCC) . Esophageal varices (HCC) . Hypokalemia . Hypomagnesemia . Malnutrition of moderate degree . Thrombocytopenia (HCC)   Consults: None    Allergies:   Allergies  Allergen Reactions  . Latex Swelling and Rash     DISCHARGE MEDICATIONS: Allergies as of 09/04/2018      Reactions   Latex Swelling, Rash      Medication List    STOP taking these medications   FLUoxetine 20 MG capsule Commonly known as:  PROZAC   gabapentin 300 MG capsule Commonly known as:  NEURONTIN   nicotine 21 mg/24hr patch Commonly known as:  NICODERM CQ - dosed in mg/24 hours     TAKE these medications   diclofenac sodium 1 % Gel Commonly known as:  VOLTAREN Apply 4 g topically 4 (four) times daily.   ergocalciferol 1.25 MG (50000 UT) capsule Commonly known as:  VITAMIN D2 Take 1 capsule (50,000 Units total) by mouth once a week.   feeding supplement (ENSURE ENLIVE) Liqd Take 237 mLs by mouth 2 (two) times daily between meals.   furosemide 40 MG tablet Commonly known as:  LASIX Take 1 tablet (40 mg total) by mouth daily.   lactulose 10 GM/15ML solution Commonly known as:  CHRONULAC Take 67.5 mLs (45 g total) by mouth 3 (three) times daily.   midodrine 5 MG tablet Commonly known as:  PROAMATINE Take 0.5 tablets (2.5 mg total) by mouth 3  (three) times daily with meals.   multivitamin with minerals Tabs tablet Take 1 tablet by mouth daily.   ondansetron 4 MG tablet Commonly known as:  ZOFRAN Take 1 tablet (4 mg total) by mouth every 8 (eight) hours as needed for nausea or vomiting.   Potassium Chloride ER 20 MEQ Tbcr Take 40 mEq by mouth daily. What changed:  how much to take   rifaximin 550 MG Tabs tablet Commonly known as:  XIFAXAN Take 1 tablet (550 mg total) by mouth 2 (two) times daily. What changed:  how much to take   spironolactone 100 MG tablet Commonly known as:  ALDACTONE Take 1 tablet (100 mg total) by mouth daily. What changed:  how much to take        Brief H and P: For complete details please refer to admission H and P, but in brief Patient is a 57 year old male with history of alcoholic cirrhosis, esophageal varices, ascites, hepatic encephalopathy recently discharged on 10/31 when he presented with similar presentation.  Patient presented from skilled nursing facility with altered mental status and vomiting.  Patient reported abdominal discomfort a week ago, no fevers or chills, no chest pain or shortness of breath.  In ED ammonia level was elevated 120, lipase level was elevated 116.  Patient was admitted for further work-up.  Hospital Course:   Acute hepatic encephalopathy (HCC) -Presented with altered mental status likely due to acute hepatic encephalopathy.  Ammonia level elevated 120 at  the time of admission, continue lactulose, rifaximin -Currently alert and oriented x4, wants to be discharged.  Discussed about lactulose and rifaximin, titrate lactulose for 2-3 BMs in a day.  Ammonia level has improved to 27   Nausea, vomiting : Possible acute pancreatitis -In the setting of liver cirrhosis, lipase was however elevated 116 -  Last paracentesis on 10/28, abdominal ultrasound was obtained which showed no acute findings, cirrhosis with TIPS, stent is patent.  Cholelithiasis however no  cholecystitis. Patient had no further nausea vomiting or any abdominal pain, tolerated clear liquid diet, was advanced to solid diet. Abdomen currently soft, no need for urgent paracentesis.  Patient has GI appointment on 09/08/2018.     Esophageal varices (HCC), secondary to portal hypertension from alcoholic cirrhosis of the liver with ascites -H&H stable, no bleeding -Patient was continued on Lasix, Aldactone  with potassium replacement Patient was followed by GI and palliative care during the previous admission, recommended follow-up with GI in 2 weeks, paracentesis in 1 to 2 weeks    Thrombocytopenia (HCC) -Due to alcoholic liver disease, platelets currently at baseline  Severe hypokalemia and hypomagnesemia -Resolved, patient received potassium and magnesium replacement.   Follow BMET    Malnutrition of moderate degree Continue nutritional supplements  Prolonged QTC QTC 588 on admission, improved to 489 on EKG on 11/8 Continue potassium replacement, keep potassium~4, Mg ~2   Day of Discharge S: Wants to go home, tolerating diet, no nausea vomiting or abdominal pain, no fevers.  Abdomen soft.  BP 107/73 (BP Location: Right Arm)   Pulse 88   Temp (!) 97.3 F (36.3 C) (Oral)   Resp 16   Ht 5\' 7"  (1.702 m)   Wt 58.1 kg   SpO2 100%   BMI 20.06 kg/m   Physical Exam: General: Alert and awake oriented x3 not in any acute distress. HEENT: anicteric sclera, pupils reactive to light and accommodation CVS: S1-S2 clear no murmur rubs or gallops Chest: clear to auscultation bilaterally, no wheezing rales or rhonchi Abdomen: soft nontender, no significant distention, normal bowel sounds Extremities: no cyanosis, clubbing or edema noted bilaterally Neuro: Cranial nerves II-XII intact, no focal neurological deficits   The results of significant diagnostics from this hospitalization (including imaging, microbiology, ancillary and laboratory) are listed below for reference.       Procedures/Studies:  Dg Chest 2 View  Result Date: 09/02/2018 CLINICAL DATA:  Altered mental status, confusion EXAM: CHEST - 2 VIEW COMPARISON:  Chest x-ray of 08/16/2018 FINDINGS: No active infiltrate or effusion is seen. Mediastinal and hilar contours are stable and the heart is within normal limits in size. No acute bony abnormality is seen. IMPRESSION: No active cardiopulmonary disease. Electronically Signed   By: Dwyane Dee M.D.   On: 09/02/2018 11:05   US Abdomen Complete  Result Date: 09/03/2018 CLINICAL DATA:  Transaminitis EXAM: ABDOMEN ULTRASOUND COMPLETE COMPARISON:  Abdominal CT 04/26/2018 FINDINGS: Gallbladder: Limited visualization due to shadowing by stone. There is a wall echo shadow appearance. No focal tenderness. Common bile duct: Diameter: 4 mm. Liver: Small, lobulated, and heterogeneous liver without discrete mass lesion. A TIPS stent is present and patent by color Doppler imaging. Given heterogeneous signal and angle of imaging direction of flow within the stent and main portal vein is not well characterized. IVC: No abnormality visualized. Pancreas: Visualized portion unremarkable. Spleen: Chronically enlarged with a 14 x 7 x 12 cm span-nearly 600 cc volume. No focal abnormality Right Kidney: Length: 11 cm. Echogenicity within normal limits. No  mass or hydronephrosis visualized. Left Kidney: Length: 12 cm. Echogenicity within normal limits. No mass or hydronephrosis visualized. Abdominal aorta: No aneurysm visualized. Other findings: Moderate ascites. IMPRESSION: 1. No acute finding. 2. Cirrhosis with TIPS. The stent is not specifically characterized but is patent. Ascites is moderate. 3. Cholelithiasis without cholecystitis. Electronically Signed   By: Marnee Spring M.D.   On: 09/03/2018 17:00   Dg Abd Acute W/chest  Result Date: 08/16/2018 CLINICAL DATA:  Abdominal pain. EXAM: DG ABDOMEN ACUTE W/ 1V CHEST COMPARISON:  CT 04/26/2018 FINDINGS: Linear subsegmental  atelectasis at the left lung base. No confluent consolidation. No pleural fluid. No free intra-abdominal air. No bowel dilatation to suggest obstruction. Air-filled small bowel in the right abdomen is nondilated and appears similar to prior CT. Moderate colonic stool burden with stool in the rectum. Calcified gallstones. TIPS in the right upper quadrant. Pelvic calcifications consistent with phleboliths. IMPRESSION: 1. No bowel obstruction or free air.  Moderate colonic stool burden. 2. Gallstones. 3. Subsegmental atelectasis at the left lung base. Electronically Signed   By: Narda Rutherford M.D.   On: 08/16/2018 06:06   Ir Paracentesis  Result Date: 08/23/2018 INDICATION: Patient with history of end-stage liver disease/cirrhosis with prior TIPS, recurrent ascites; request made for diagnostic and therapeutic paracentesis. EXAM: ULTRASOUND GUIDED DIAGNOSTIC AND THERAPEUTIC PARACENTESIS MEDICATIONS: None COMPLICATIONS: None immediate. PROCEDURE: Informed written consent was obtained from the patient after a discussion of the risks, benefits and alternatives to treatment. A timeout was performed prior to the initiation of the procedure. Initial ultrasound scanning demonstrates a small amount of ascites within the left lower abdominal quadrant. The left lower abdomen was prepped and draped in the usual sterile fashion. 1% lidocaine was used for local anesthesia. Following this, a 19 gauge, 7-cm, Yueh catheter was introduced. An ultrasound image was saved for documentation purposes. The paracentesis was performed. The catheter was removed and a dressing was applied. The patient tolerated the procedure well without immediate post procedural complication. FINDINGS: A total of approximately 750 cc of yellow fluid was removed. Samples were sent to the laboratory as requested by the clinical team. IMPRESSION: Successful ultrasound-guided diagnostic and therapeutic paracentesis yielding 750 cc of peritoneal fluid. Read  by: Jeananne Rama, PA-C Electronically Signed   By: Malachy Moan M.D.   On: 08/23/2018 13:01      LAB RESULTS: Basic Metabolic Panel: Recent Labs  Lab 09/03/18 0411 09/03/18 0837 09/04/18 0538  NA 141  --  135  K 2.7*  --  3.5  CL 108  --  105  CO2 28  --  26  GLUCOSE 102*  --  89  BUN 7  --  <5*  CREATININE 0.69  --  0.71  CALCIUM 9.1  --  8.6*  MG  --  1.7  --    Liver Function Tests: Recent Labs  Lab 09/03/18 0411 09/04/18 0538  AST 85* 78*  ALT 61* 59*  ALKPHOS 142* 132*  BILITOT 5.3* 5.2*  PROT 5.8* 5.4*  ALBUMIN 2.1* 2.0*   Recent Labs  Lab 09/03/18 0411 09/04/18 0538  LIPASE 138* 133*   Recent Labs  Lab 09/03/18 0411 09/04/18 0538  AMMONIA 34 27   CBC: Recent Labs  Lab 09/02/18 1040  WBC 3.6*  NEUTROABS 1.9  HGB 11.9*  HCT 37.1*  MCV 94.2  PLT 62*   Cardiac Enzymes: No results for input(s): CKTOTAL, CKMB, CKMBINDEX, TROPONINI in the last 168 hours. BNP: Invalid input(s): POCBNP CBG: No results for input(s):  GLUCAP in the last 168 hours.    Disposition and Follow-up: Discharge Instructions    Diet - low sodium heart healthy   Complete by:  As directed    Increase activity slowly   Complete by:  As directed        DISPOSITION: Skilled nursing facility   DISCHARGE FOLLOW-UP  Contact information for follow-up providers    Meredith Pel, NP Follow up on 09/08/2018.   Specialty:  Gastroenterology Why:  at 10:30AM (GI follow-up) Contact information: [address] [place] [address] [phone]        [name], MD. Schedule an appointment as soon as possible for a visit in 2 week(s).   Specialty:  Family Medicine Contact information: [address] [place] [address] [phone]            Contact information for after-discharge care    Destination    [facility] .   Service:  Skilled Nursing Contact information: [address] [place]  [address] [phone]                   Time coordinating discharge:  40 minutes  Signed:   Thad Ranger M.D. [facility] 09/04/2018, 10:03 AM Pager: [phone]

## 2018-09-04 NOTE — Progress Notes (Signed)
Patient will DC to: [facility] Anticipated DC date: 09/04/18 Family notified: [name] Transport by: Family (son [name] and his mother)  Per MD patient ready for DC to [facility] . RN, patient, patient's family, and facility notified of DC. Discharge Summary sent to facility. RN given number for report [phone]. DC packet on chart. Family (patient son [name] and his mother) will be arriving around 1:00 pm to transport patient to [facility]. CSW signing off.  [place], [place] [phone]

## 2018-09-04 NOTE — Progress Notes (Signed)
Pt discharging to [facility] via family. All belongings sent with pt. Discharge paperwork given to family. All questions and concerns addressed. Pt taken down in wheelchair. Nurse called report.

## 2018-09-04 NOTE — Clinical Social Work Note (Signed)
Clinical Social Work Assessment  Patient Details  Name: Kenneth [name] MRN: [id number] Date of Birth: January 20, 1961  Date of referral:  09/02/18               Reason for consult:  Discharge Planning                Permission sought to share information with:  Case Manager, Facility Medical sales representative, Family Supports Permission granted to share information::  Yes, Verbal Permission Granted  Name::     Surveyor, quantity::  SNFs  Relationship::  son  Contact Information:  [phone]  Housing/Transportation Living arrangements for the past 2 months:  Single Family Home Source of Information:  Adult Children Patient Interpreter Needed:  None Criminal Activity/Legal Involvement Pertinent to Current Situation/Hospitalization:  No - Comment as needed Significant Relationships:  Adult Children Lives with:  Self Do you feel safe going back to the place where you live?  No Need for family participation in patient care:  Yes (Comment)  Care giving concerns:  CSW received referral for possible SNF placement at time of discharge. CSW see's notes where weekday CSW Spoke with patient and patient's Kenneth [name] regarding possibility of SNF placement . Patient's  son  is currently unable to care for her at their home given patient's current needs and fall risk.  Patient and son expressed to 3W social worker understanding of PT recommendation and are agreeable to SNF placement at time of discharge. This weekend CSW to continue to follow and assist with discharge planning needs.     Social Worker assessment / plan:  Weekday CSW Kenneth [name] spoke with patient and  Son per notes concerning possibility of rehab at [facility] before returning home.    Employment status:  Retired Health and safety inspector:  Self Pay (Medicaid Pending) PT Recommendations:  Skilled Nursing Facility Information / Referral to community resources:  Skilled Nursing Facility  Patient/Family's Response to care:  Patient and  son  recognize need for  rehab before returning home and are agreeable to a SNF in [place]. They report preference for    [facility]   . CSW explained insurance authorization process. Patient's family reported that they want patient to get stronger to be able to come back home.    Patient/Family's Understanding of and Emotional Response to Diagnosis, Current Treatment, and Prognosis:  Patient/family is realistic regarding therapy needs and expressed being hopeful for SNF placement. Patient expressed understanding of CSW role and discharge process as well as medical condition. No questions/concerns about plan or treatment.    Emotional Assessment Appearance:  Other (Comment Required(unable to assess) Attitude/Demeanor/Rapport:  Unable to Assess Affect (typically observed):  Unable to Assess Orientation:  Oriented to Self, Oriented to Situation, Oriented to Place, Oriented to  Time Alcohol / Substance use:  Not Applicable Psych involvement (Current and /or in the community):  No (Comment)  Discharge Needs  Concerns to be addressed:  Discharge Planning Concerns Readmission within the last 30 days:  No Current discharge risk:  Dependent with Mobility Barriers to Discharge:  Continued Medical Work up   [employer], LCSW 09/04/2018, 11:56 AM

## 2018-09-08 ENCOUNTER — Encounter: Payer: Self-pay | Admitting: Nurse Practitioner

## 2018-09-08 ENCOUNTER — Ambulatory Visit (INDEPENDENT_AMBULATORY_CARE_PROVIDER_SITE_OTHER): Payer: Self-pay | Admitting: Nurse Practitioner

## 2018-09-08 VITALS — BP 108/62 | HR 70 | Ht 67.0 in | Wt 137.5 lb

## 2018-09-08 DIAGNOSIS — K7460 Unspecified cirrhosis of liver: Secondary | ICD-10-CM

## 2018-09-08 DIAGNOSIS — K7290 Hepatic failure, unspecified without coma: Secondary | ICD-10-CM

## 2018-09-08 NOTE — Patient Instructions (Addendum)
If you are age 57 or older, your body mass index should be between 23-30. Your Body mass index is 21.54 kg/m. If this is out of the aforementioned range listed, please consider follow up with your Primary Care Provider.  If you are age 57 or younger, your body mass index should be between 19-25. Your Body mass index is 21.54 kg/m. If this is out of the aformentioned range listed, please consider follow up with your Primary Care Provider.   Continue current dose of diuretics. Continue current dose of Lactulose and Xifaxan.  We will call you regarding transplant referral.  Follow up appointment with Dr. Myrtie Neitheranis on October 28, 2018 at 11:30 a.m.  Thank you for choosing me and [facility].   Willette ClusterPaula Guenther, NP   Low-Sodium Eating Plan Sodium, which is an element that makes up salt, helps you maintain a healthy balance of fluids in your body. Too much sodium can increase your blood pressure and cause fluid and waste to be held in your body. Your health care provider or dietitian may recommend following this plan if you have high blood pressure (hypertension), kidney disease, liver disease, or heart failure. Eating less sodium can help lower your blood pressure, reduce swelling, and protect your heart, liver, and kidneys. What are tips for following this plan? General guidelines  Most people on this plan should limit their sodium intake to 1,500-2,000 mg (milligrams) of sodium each day. Reading food labels  The Nutrition Facts label lists the amount of sodium in one serving of the food. If you eat more than one serving, you must multiply the listed amount of sodium by the number of servings.  Choose foods with less than 140 mg of sodium per serving.  Avoid foods with 300 mg of sodium or more per serving. Shopping  Look for lower-sodium products, often labeled as "low-sodium" or "no salt added."  Always check the sodium content even if foods are labeled as "unsalted" or "no  salt added".  Buy fresh foods. ? Avoid canned foods and premade or frozen meals. ? Avoid canned, cured, or processed meats  Buy breads that have less than 80 mg of sodium per slice. Cooking  Eat more home-cooked food and less restaurant, buffet, and fast food.  Avoid adding salt when cooking. Use salt-free seasonings or herbs instead of table salt or sea salt. Check with your health care provider or pharmacist before using salt substitutes.  Cook with plant-based oils, such as canola, sunflower, or olive oil. Meal planning  When eating at a restaurant, ask that your food be prepared with less salt or no salt, if possible.  Avoid foods that contain MSG (monosodium glutamate). MSG is sometimes added to Cong[place]inese food, bouillon, and some canned foods. What foods are recommended? The items listed may not be a complete list. Talk with your dietitian about what dietary choices are best for you. Grains Low-sodium cereals, including oats, puffed wheat and rice, and shredded wheat. Low-sodium crackers. Unsalted rice. Unsalted pasta. Low-sodium bread. Whole-grain breads and whole-grain pasta. Vegetables Fresh or frozen vegetables. "No salt added" canned vegetables. "No salt added" tomato sauce and paste. Low-sodium or reduced-sodium tomato and vegetable juice. Fruits Fresh, frozen, or canned fruit. Fruit juice. Meats and other protein foods Fresh or frozen (no salt added) meat, poultry, seafood, and fish. Low-sodium canned tuna and salmon. Unsalted nuts. Dried peas, beans, and lentils without added salt. Unsalted canned beans. Eggs. Unsalted nut butters. Dairy Milk. Soy milk. Cheese that is naturally low  in sodium, such as ricotta cheese, fresh mozzarella, or Swiss cheese Low-sodium or reduced-sodium cheese. Cream cheese. Yogurt. Fats and oils Unsalted butter. Unsalted margarine with no trans fat. Vegetable oils such as canola or olive oils. Seasonings and other foods Fresh and dried herbs and  spices. Salt-free seasonings. Low-sodium mustard and ketchup. Sodium-free salad dressing. Sodium-free light mayonnaise. Fresh or refrigerated horseradish. Lemon juice. Vinegar. Homemade, reduced-sodium, or low-sodium soups. Unsalted popcorn and pretzels. Low-salt or salt-free chips. What foods are not recommended? The items listed may not be a complete list. Talk with your dietitian about what dietary choices are best for you. Grains Instant hot cereals. Bread stuffing, pancake, and biscuit mixes. Croutons. Seasoned rice or pasta mixes. Noodle soup cups. Boxed or frozen macaroni and cheese. Regular salted crackers. Self-rising flour. Vegetables Sauerkraut, pickled vegetables, and relishes. Olives. [place] fries. Onion rings. Regular canned vegetables (not low-sodium or reduced-sodium). Regular canned tomato sauce and paste (not low-sodium or reduced-sodium). Regular tomato and vegetable juice (not low-sodium or reduced-sodium). Frozen vegetables in sauces. Meats and other protein foods Meat or fish that is salted, canned, smoked, spiced, or pickled. Bacon, ham, sausage, hotdogs, corned beef, chipped beef, packaged lunch meats, salt pork, jerky, pickled herring, anchovies, regular canned tuna, sardines, salted nuts. Dairy Processed cheese and cheese spreads. Cheese curds. Blue cheese. Feta cheese. String cheese. Regular cottage cheese. Buttermilk. Canned milk. Fats and oils Salted butter. Regular margarine. Ghee. Bacon fat. Seasonings and other foods Onion salt, garlic salt, seasoned salt, table salt, and sea salt. Canned and packaged gravies. Worcestershire sauce. Tartar sauce. Barbecue sauce. Teriyaki sauce. Soy sauce, including reduced-sodium. Steak sauce. Fish sauce. Oyster sauce. Cocktail sauce. Horseradish that you find on the shelf. Regular ketchup and mustard. Meat flavorings and tenderizers. Bouillon cubes. Hot sauce and Tabasco sauce. Premade or packaged marinades. Premade or packaged taco  seasonings. Relishes. Regular salad dressings. Salsa. Potato and tortilla chips. Corn chips and puffs. Salted popcorn and pretzels. Canned or dried soups. Pizza. Frozen entrees and pot pies. Summary  Eating less sodium can help lower your blood pressure, reduce swelling, and protect your heart, liver, and kidneys.  Most people on this plan should limit their sodium intake to 1,500-2,000 mg (milligrams) of sodium each day.  Canned, boxed, and frozen foods are high in sodium. Restaurant foods, fast foods, and pizza are also very high in sodium. You also get sodium by adding salt to food.  Try to cook at home, eat more fresh fruits and vegetables, and eat less fast food, canned, processed, or prepared foods. This information is not intended to replace advice given to you by your health care provider. Make sure you discuss any questions you have with your health care provider. Document Released: 04/04/2002 Document Revised: 10/06/2016 Document Reviewed: 10/06/2016 Elsevier Interactive Patient Education  [employer].

## 2018-09-08 NOTE — Progress Notes (Signed)
Chief Complaint:  Hospital follow up    IMPRESSION and PLAN:    721.  57 year old male with end-stage liver disease status post TIPS procedure late June or maybe it was July.  He has struggled with recurring ascites as well as hepatic encephalopathy over the last few months.  TIPS patent when checked early September. He has had 2 recent admissions for HE. -Patient has not shown for two different liver transplant consultations. I do believe Dr. Myrtie Neitheranis / Nurse working on trying to get him referred.  Unfortunately I am skeptical about his candidacy for transplant as it sounds like his social support network is just not there.  In fact he is now living in an assisted living facility as there was no one at home to take care of him.  -Continue current dose of diuretics consisting of Lasix 40 mg daily.  His Aldactone was reduced to 50 mg with most recent hospitalization (? Reason). At any rate he is doing well without reaccumulation of any significant amount of ascites.  He has no peripheral edema. -We do need to make sure that he is on a low-sodium diet however.  It sounds like he is eating canned foods at the assisted living facility.  Will recommend 2 g sodium diet -He is already on 45 g of lactulose 3 times daily.  Though not having bowel movements every day, he is having several a week.  At this point I am not increasing the lactulose given his problems with electrolyte imbalances.  He will continue the Xifaxan -follow up with Dr. Myrtie Neitheranis in 4-6 weeks.    2. Prolonged QT interval    HPI:     Patient is a 57 yo male known to Dr. Myrtie Neitheranis.  He has ESLD, status post TIPS procedure late June. He did well for a while following TIPS procedure but over the last few months has struggled with hepatic encephalopathy and reoccurring ascites requiring frequent LVPs. He was hospitalized a few weeks ago and we were asked to help with management of HE which wasn't responding to Lactulose. Records reviewed. There  was no evidence for an infectious process including negative ascitic fluid analysis. Chest x-ray was negative, UA unremarkable.  No evidence for GI bleeding.  [facility]  Palliative Medicine evaluated him and there were discussions about Hospice. However, after adding Xifaxan his condition improved.  Apparently there was no one at home to care for the patient, [facility]. We have referred patient for liver transplant twice but he didn't go either time. He has since expressed interest in another referral and Dr. Myrtie Neitheranis has apparently been working on this.   Kenneth [name] was actually readmitted again for two days on 09/02/18. Admission criteria included nausea / vomiting, severe electrolyte disturbances,  abdominal pain, and  altered mental status . We were not consulted during that brief admission but I reviewed the discharge summary. HE eventually resolved. He was continued on Xifaxan and lactulose, anti-depressant was held due to AMS. Found to have prolonged QTC of 588. Potassium and Mg were repleted.  At time of discharge potassium was up to 3.5, magnesium 1.7.  Liver tests were at baseline. INR stable at 2.2.   Kenneth [name] feels okay. He is pleased that there hasn't been any re-accumulation of fluid in his belly. He has no abdominal pain, no further N/V or other GI symptoms.  Taking lactulose 45 grams TID and having a  couple of BMs a day but not everyday. Says he is compliant with Xifaxan.  He is being served canned foods at the facility  Review of systems:     No chest pain, no SOB, no fevers, no urinary sx   Past Medical History:  Diagnosis Date  . Alcohol abuse   . Anxiety   . Arthritis    "never diagnosis" (09/02/2018)  . Blood clot in vein   . Cirrhosis (HCC)   . Depression   . Dyspnea    Due to ascites  . Esophageal varices (HCC) 09/02/2014  . H/O [facility] spotted fever    age 43 or 27  . Hernia, inguinal    right  . Hiatal hernia  09/02/2014  . Polysubstance abuse (HCC)   . Portal hypertension with esophageal varices (HCC) 09/02/2014  . Portal hypertensive gastropathy (HCC) 09/02/2014  . Thrombocytopenia (HCC) 09/02/2014  . Upper GI bleed 08/31/2014    Patient's surgical history, family medical history, social history, medications and allergies were all reviewed in Epic   Serum creatinine: 0.71 mg/dL 16/10/96 0454 Estimated creatinine clearance: 89.9 mL/min  Current Outpatient Medications  Medication Sig Dispense Refill  . diclofenac sodium (VOLTAREN) 1 % GEL Apply 4 g topically 4 (four) times daily. 100 g 0  . ergocalciferol (DRISDOL) 50000 units capsule Take 1 capsule (50,000 Units total) by mouth once a week. 9 capsule 1  . feeding supplement, ENSURE ENLIVE, (ENSURE ENLIVE) LIQD Take 237 mLs by mouth 2 (two) times daily between meals. 237 mL 12  . furosemide (LASIX) 40 MG tablet Take 1 tablet (40 mg total) by mouth daily. 30 tablet 1  . lactulose (CHRONULAC) 10 GM/15ML solution Take 67.5 mLs (45 g total) by mouth 3 (three) times daily. 500 mL 1  . midodrine (PROAMATINE) 5 MG tablet Take 0.5 tablets (2.5 mg total) by mouth 3 (three) times daily with meals. 90 tablet 1  . Multiple Vitamin (MULTIVITAMIN WITH MINERALS) TABS tablet Take 1 tablet by mouth daily. 30 tablet 0  . ondansetron (ZOFRAN) 4 MG tablet Take 1 tablet (4 mg total) by mouth every 8 (eight) hours as needed for nausea or vomiting. 60 tablet 3  . Potassium Chloride ER 20 MEQ TBCR Take 40 mEq by mouth daily. (Patient taking differently: Take 20 mEq by mouth daily. ) 30 tablet 0  . rifaximin (XIFAXAN) 550 MG TABS tablet Take 1 tablet (550 mg total) by mouth 2 (two) times daily. 60 tablet 1  . spironolactone (ALDACTONE) 100 MG tablet Take 1 tablet (100 mg total) by mouth daily. (Patient taking differently: Take 50 mg by mouth daily. ) 30 tablet 0   No current facility-administered medications for this visit.     Physical Exam:     BP 108/62   Pulse 70    Ht 5\' 7"  (1.702 m)   Wt 137 lb 8 oz (62.4 kg)   BMI 21.54 kg/m   GENERAL:  Pleasant male in NAD PSYCH: : Cooperative, normal affect EENT:  conjunctiva pink, mucous membranes moist, neck supple without masses CARDIAC:  RRR,  1+ BLE edema PULM: Normal respiratory effort, lungs CTA bilaterally, no wheezing ABDOMEN:  Nondistended, soft, nontender. No obvious masses,  normal bowel sounds SKIN:  turgor, no lesions seen Musculoskeletal:  Normal muscle tone, normal strength NEURO: Alert and oriented x 3, no focal neurologic deficits. No asterixis   Willette Cluster , NP 09/08/2018, 10:48 AM

## 2018-09-09 ENCOUNTER — Encounter: Payer: Self-pay | Admitting: Nurse Practitioner

## 2018-09-10 NOTE — Progress Notes (Signed)
Thank you for sending this case to me. I have reviewed the entire note, and the outlined plan seems appropriate.  His liver disease has decompensated this year for unclear reasons, and he is now end stage. TIPS did not control his ascites as expected, at least partially because of hepatic decompensation, and partially because of doubtful compliance with sodium restriction.  That sounds like more of a problem now.  He is unlikely to be a transplant candidate due to prior failure to make appointments and current lack of social support (various family members have come to some appointments).  Raynelle FanningJulie,     Please inquire with [facility] to see if they rec'd our recent request for evaluation and if they are even agreeable to seeing him, considering the issues above.  Please increase his spironolactone back to 100 mg once daily since I expect him to re-accumulate ascites soon, and he is hypokalemic.  Amada JupiterHenry Danis, MD

## 2018-09-15 ENCOUNTER — Telehealth: Payer: Self-pay

## 2018-09-15 NOTE — Telephone Encounter (Signed)
As per Kenneth [name], [employer] [facility] [employer], the case is still active 

## 2018-09-17 ENCOUNTER — Encounter (HOSPITAL_COMMUNITY): Payer: Self-pay | Admitting: Physician Assistant

## 2018-09-17 ENCOUNTER — Ambulatory Visit (HOSPITAL_COMMUNITY)
Admission: RE | Admit: 2018-09-17 | Discharge: 2018-09-17 | Disposition: A | Discharge status: Home / Self Care | Payer: Medicaid Other | Source: Ambulatory Visit | Attending: Gastroenterology | Admitting: Gastroenterology

## 2018-09-17 DIAGNOSIS — K766 Portal hypertension: Secondary | ICD-10-CM

## 2018-09-17 DIAGNOSIS — K7460 Unspecified cirrhosis of liver: Secondary | ICD-10-CM | POA: Diagnosis not present

## 2018-09-17 DIAGNOSIS — R188 Other ascites: Secondary | ICD-10-CM | POA: Insufficient documentation

## 2018-09-17 DIAGNOSIS — I8500 Esophageal varices without bleeding: Secondary | ICD-10-CM

## 2018-09-17 DIAGNOSIS — D689 Coagulation defect, unspecified: Secondary | ICD-10-CM

## 2018-09-17 DIAGNOSIS — D696 Thrombocytopenia, unspecified: Secondary | ICD-10-CM

## 2018-09-17 DIAGNOSIS — K7290 Hepatic failure, unspecified without coma: Secondary | ICD-10-CM | POA: Diagnosis present

## 2018-09-17 DIAGNOSIS — K7682 Hepatic encephalopathy: Secondary | ICD-10-CM

## 2018-09-17 DIAGNOSIS — R110 Nausea: Secondary | ICD-10-CM

## 2018-09-17 DIAGNOSIS — K7031 Alcoholic cirrhosis of liver with ascites: Secondary | ICD-10-CM

## 2018-09-17 HISTORY — PX: IR PARACENTESIS: IMG2679

## 2018-09-17 LAB — BODY FLUID CELL COUNT WITH DIFFERENTIAL
EOS FL: 0 %
Lymphs, Fluid: 10 %
MONOCYTE-MACROPHAGE-SEROUS FLUID: 47 % — AB (ref 50–90)
Neutrophil Count, Fluid: 43 % — ABNORMAL HIGH (ref 0–25)
WBC FLUID: 63 uL (ref 0–1000)

## 2018-09-17 MED ORDER — LIDOCAINE HCL 1 % IJ SOLN
INTRAMUSCULAR | Status: DC | PRN
  Administered 2018-09-17: 10 mL

## 2018-09-17 MED ORDER — LIDOCAINE HCL 1 % IJ SOLN
INTRAMUSCULAR | Status: AC
  Filled 2018-09-17: qty 20

## 2018-09-17 NOTE — Procedures (Signed)
PROCEDURE SUMMARY:  Successful US guided paracentesis from left lateral abdomen.  Yielded 3.2 L of clear yellow fluid.  No immediate complications.  Patient tolerated well.   Specimen was sent for labs.  Jerry CarasWENDY S Tonee Silverstein PA-C 09/17/2018 3:57 PM

## 2018-09-21 LAB — PATHOLOGIST SMEAR REVIEW

## 2018-09-28 ENCOUNTER — Other Ambulatory Visit (HOSPITAL_COMMUNITY): Payer: Self-pay | Admitting: Interventional Radiology

## 2018-09-28 DIAGNOSIS — Z95828 Presence of other vascular implants and grafts: Secondary | ICD-10-CM

## 2018-10-02 ENCOUNTER — Emergency Department (HOSPITAL_COMMUNITY)
Admission: EM | Admit: 2018-10-02 | Discharge: 2018-10-03 | Disposition: A | Discharge status: Home / Self Care | Payer: Medicaid Other | Attending: Emergency Medicine | Admitting: Emergency Medicine

## 2018-10-02 ENCOUNTER — Encounter (HOSPITAL_COMMUNITY): Payer: Self-pay | Admitting: Emergency Medicine

## 2018-10-02 DIAGNOSIS — Z79899 Other long term (current) drug therapy: Secondary | ICD-10-CM | POA: Insufficient documentation

## 2018-10-02 DIAGNOSIS — F17210 Nicotine dependence, cigarettes, uncomplicated: Secondary | ICD-10-CM | POA: Diagnosis not present

## 2018-10-02 DIAGNOSIS — R0602 Shortness of breath: Secondary | ICD-10-CM | POA: Diagnosis not present

## 2018-10-02 DIAGNOSIS — R188 Other ascites: Secondary | ICD-10-CM | POA: Diagnosis present

## 2018-10-02 DIAGNOSIS — K7031 Alcoholic cirrhosis of liver with ascites: Secondary | ICD-10-CM | POA: Diagnosis not present

## 2018-10-02 HISTORY — DX: Unspecified cirrhosis of liver: K74.60

## 2018-10-02 HISTORY — DX: Other ascites: R18.8

## 2018-10-02 LAB — CBC WITH DIFFERENTIAL/PLATELET
Abs Immature Granulocytes: 0.04 10*3/uL (ref 0.00–0.07)
Basophils Absolute: 0.1 10*3/uL (ref 0.0–0.1)
Basophils Relative: 1 %
EOS ABS: 0.2 10*3/uL (ref 0.0–0.5)
EOS PCT: 4 %
HEMATOCRIT: 33.8 % — AB (ref 39.0–52.0)
HEMOGLOBIN: 10.6 g/dL — AB (ref 13.0–17.0)
IMMATURE GRANULOCYTES: 1 %
LYMPHS ABS: 1 10*3/uL (ref 0.7–4.0)
LYMPHS PCT: 17 %
MCH: 30.4 pg (ref 26.0–34.0)
MCHC: 31.4 g/dL (ref 30.0–36.0)
MCV: 96.8 fL (ref 80.0–100.0)
MONOS PCT: 16 %
Monocytes Absolute: 0.9 10*3/uL (ref 0.1–1.0)
NEUTROS PCT: 61 %
Neutro Abs: 3.4 10*3/uL (ref 1.7–7.7)
Platelets: 74 10*3/uL — ABNORMAL LOW (ref 150–400)
RBC: 3.49 MIL/uL — ABNORMAL LOW (ref 4.22–5.81)
RDW: 16.4 % — AB (ref 11.5–15.5)
WBC: 5.6 10*3/uL (ref 4.0–10.5)
nRBC: 0 % (ref 0.0–0.2)

## 2018-10-02 LAB — APTT: aPTT: 41 seconds — ABNORMAL HIGH (ref 24–36)

## 2018-10-02 LAB — COMPREHENSIVE METABOLIC PANEL
ALK PHOS: 172 U/L — AB (ref 38–126)
ALT: 63 U/L — ABNORMAL HIGH (ref 0–44)
AST: 93 U/L — ABNORMAL HIGH (ref 15–41)
Albumin: 1.8 g/dL — ABNORMAL LOW (ref 3.5–5.0)
Anion gap: 8 (ref 5–15)
BILIRUBIN TOTAL: 3.5 mg/dL — AB (ref 0.3–1.2)
CALCIUM: 8 mg/dL — AB (ref 8.9–10.3)
CO2: 25 mmol/L (ref 22–32)
CREATININE: 0.7 mg/dL (ref 0.61–1.24)
Chloride: 97 mmol/L — ABNORMAL LOW (ref 98–111)
GFR calc Af Amer: >60 mL/min (ref >60)
Glucose, Bld: 85 mg/dL (ref 70–99)
POTASSIUM: 3.6 mmol/L (ref 3.5–5.1)
Sodium: 130 mmol/L — ABNORMAL LOW (ref 135–145)
TOTAL PROTEIN: 6 g/dL — AB (ref 6.5–8.1)

## 2018-10-02 LAB — PROTIME-INR
INR: 1.75
Prothrombin Time: 20.2 seconds — ABNORMAL HIGH (ref 11.4–15.2)

## 2018-10-02 MED ORDER — LIDOCAINE 5 % EX PTCH
1.0000 | MEDICATED_PATCH | CUTANEOUS | Status: DC
  Administered 2018-10-02: 1 via TRANSDERMAL
  Filled 2018-10-02: qty 1

## 2018-10-02 NOTE — ED Provider Notes (Signed)
[name] Provider Note   CSN: [id number] Arrival date & time: 10/02/18  2018     History   Chief Complaint Chief Complaint  Patient presents with  . Ascites    HPI Kenneth [name] is a 57 y.o. male.  Patient with history of alcoholic cirrhosis, portal hypertension, esophageal varices and abdominal ascites presents for therapeutic centesis.  Last paracentesis 09/17/18 which drained approximately 3 L off.  Patient endorses SOB, but denies any chest pain, abdominal pain.  [facility].  Patient denies any recent alcohol use.  The history is provided by the patient.    Past Medical History:  Diagnosis Date  . Alcohol abuse   . Anxiety   . Arthritis    "never diagnosis" (09/02/2018)  . Ascites   . Blood clot in vein   . Cirrhosis (HCC)   . Depression   . Dyspnea    Due to ascites  . Esophageal varices (HCC) 09/02/2014  . [facility]    age 89 or [age]  . Hernia, inguinal    right  . Hiatal hernia 09/02/2014  . Liver cirrhosis (HCC)   . Polysubstance abuse (HCC)   . Portal hypertension with esophageal varices (HCC) 09/02/2014  . Portal hypertensive gastropathy (HCC) 09/02/2014  . Thrombocytopenia (HCC) 09/02/2014  . Upper GI bleed 08/31/2014    Patient Active Problem List   Diagnosis Date Noted  . Acute liver failure with hepatic coma (HCC)   . Malnutrition of moderate degree 08/17/2018  . Accelerated junctional rhythm 08/17/2018  . Hypokalemia 08/16/2018  . Hypomagnesemia 08/16/2018  . Hepatic encephalopathy (HCC) 08/15/2018  . Peripheral neuropathy 08/02/2018  . Ascites due to alcoholic cirrhosis (HCC) 05/19/2018  . Depression 12/18/2016  . Testosterone deficiency in male 08/11/2016  . Inguinal hernia 02/28/2016  . Rectal bleed 01/24/2016  . Umbilical hernia 01/24/2016  . Ascites 01/24/2016  . Tobacco abuse 01/24/2016  . Gynecomastia, male 01/24/2016  . Esophageal varices (HCC)  09/02/2014  . Portal hypertension with esophageal varices (HCC) 09/02/2014  . Hiatal hernia 09/02/2014  . Leukopenia 09/02/2014  . Thrombocytopenia (HCC) 09/02/2014  . Alcoholic cirrhosis of liver with ascites [facility](HCC)     Past Surgical History:  Procedure Laterality Date  . ESOPHAGOGASTRODUODENOSCOPY Left 09/01/2014   Procedure: ESOPHAGOGASTRODUODENOSCOPY (EGD);  Surgeon: Charna ElizabethJyothi Mann, MD;  Location: [facility];  Service: Endoscopy;  Laterality: Left;  . EYE MUSCLE SURGERY Left 1967   "lazy eye"  . IR PARACENTESIS  03/16/2018  . IR PARACENTESIS  03/29/2018  . IR PARACENTESIS  04/19/2018  . IR PARACENTESIS  04/27/2018  . IR PARACENTESIS  05/10/2018  . IR PARACENTESIS  05/19/2018  . IR PARACENTESIS  06/11/2018  . IR PARACENTESIS  06/29/2018  . IR PARACENTESIS  07/16/2018  . IR PARACENTESIS  08/03/2018  . IR PARACENTESIS  08/23/2018  . IR PARACENTESIS  09/17/2018  . IR RADIOLOGIST EVAL & MGMT  05/13/2018  . IR RADIOLOGIST EVAL & MGMT  06/29/2018  . IR TIPS  05/19/2018  . RADIOLOGY WITH ANESTHESIA N/A 05/19/2018   Procedure: TIPS;  Surgeon: Simonne ComeWatts, John, MD;  [facility];  Service: Radiology;  Laterality: N/A;        Home Medications    Prior to Admission medications   Medication Sig Start Date End Date Taking? Authorizing Provider  diclofenac sodium (VOLTAREN) 1 % GEL Apply 4 g topically 4 (four) times daily. 12/15/17   Hoy RegisterNewlin, Enobong, MD  ergocalciferol (DRISDOL)  50000 units capsule Take 1 capsule (50,000 Units total) by mouth once a week. 08/03/18   [name], MD  feeding supplement, ENSURE ENLIVE, (ENSURE ENLIVE) LIQD Take 237 mLs by mouth 2 (two) times daily between meals. 08/18/18   Calvert Cantor, MD  FLUoxetine (PROZAC) 20 MG tablet Take 20 mg by mouth daily.    [provider]  furosemide (LASIX) 40 MG tablet Take 1 tablet (40 mg total) by mouth daily. 09/04/18   Rai, Ripudeep K, MD  lactulose (CHRONULAC) 10 GM/15ML solution Take 67.5 mLs (45 g total) by mouth 3 (three)  times daily. 09/04/18   Rai, Ripudeep K, MD  midodrine (PROAMATINE) 5 MG tablet Take 0.5 tablets (2.5 mg total) by mouth 3 (three) times daily with meals. 09/04/18   Rai, Delene Ruffini, MD  Multiple Vitamin (MULTIVITAMIN WITH MINERALS) TABS tablet Take 1 tablet by mouth daily. 08/18/18   Calvert Cantor, MD  ondansetron (ZOFRAN) 4 MG tablet Take 1 tablet (4 mg total) by mouth every 8 (eight) hours as needed for nausea or vomiting. 08/11/18   Sherrilyn Rist, MD  Potassium Chloride ER 20 MEQ TBCR Take 40 mEq by mouth daily. Patient taking differently: Take 20 mEq by mouth daily.  08/17/18   Calvert Cantor, MD  rifaximin (XIFAXAN) 550 MG TABS tablet Take 1 tablet (550 mg total) by mouth 2 (two) times daily. 09/04/18   Rai, Delene Ruffini, MD  spironolactone (ALDACTONE) 100 MG tablet Take 1 tablet (100 mg total) by mouth daily. Patient taking differently: Take 50 mg by mouth daily.  08/27/18   Rodolph Bong, MD    Family History Family History  Problem Relation Age of Onset  . Leukemia Father        acute myloid   . Hyperlipidemia Father   . Hypertension Father   . Colon cancer Neg Hx   . Esophageal cancer Neg Hx   . Pancreatic cancer Neg Hx   . Stomach cancer Neg Hx   . Liver disease Neg Hx     Social History Social History   Tobacco Use  . Smoking status: Current Every Day Smoker    Packs/day: 1.00    Years: 43.00    Pack years: 43.00    Types: Cigarettes  . Smokeless tobacco: Never Used  Substance Use Topics  . Alcohol use: No    Comment: 09/02/2018 "no alcohol since 01/23/16"  . Drug use: Yes    Frequency: 5.0 times per week    Types: Marijuana    Comment: 09/02/2018 "nothing in the last month"     Allergies   Latex   Review of Systems Review of Systems  Constitutional: Negative for activity change and fever.  Respiratory: Negative for shortness of breath.   Cardiovascular: Negative for chest pain.  Gastrointestinal: Positive for abdominal distention. Negative for  abdominal pain and constipation.  All other systems reviewed and are negative.    Physical Exam Updated Vital Signs BP 94/77 (BP Location: Right Arm)   Pulse 74   Temp 98.9 F (37.2 C) (Oral)   Resp 16   Ht 5\' 7"  (1.702 m)   Wt 66.7 kg   SpO2 99%   BMI 23.02 kg/m   Physical Exam  Constitutional: He is oriented to person, place, and time. He appears well-developed and well-nourished. No distress.  HENT:  Head: Normocephalic and atraumatic.  Eyes: No scleral icterus.  Neck: No JVD present.  Cardiovascular: Normal rate and regular rhythm.  Pulmonary/Chest: Effort normal  and breath sounds normal.  Abdominal: He exhibits distension and fluid wave. He exhibits no mass. There is no tenderness. A hernia is present.  Neurological: He is alert and oriented to person, place, and time.  Skin: Skin is warm and dry.  Psychiatric: He has a normal mood and affect. His behavior is normal.     ED Treatments / Results  Labs (all labs ordered are listed, but only abnormal results are displayed) Labs Reviewed  CBC WITH DIFFERENTIAL/PLATELET  COMPREHENSIVE METABOLIC PANEL  PROTIME-INR  APTT    EKG None  Radiology No results found.  Procedures .Paracentesis Date/Time: 10/02/2018 11:06 PM Performed by: Garnette Gunner, MD Authorized by: Gwyneth Sprout, MD   Consent:    Consent obtained:  Verbal   Consent given by:  Patient   Risks discussed:  Bleeding, infection and pain   Alternatives discussed:  No treatment Pre-procedure details:    Procedure purpose:  Therapeutic   Preparation: Patient was prepped and draped in usual sterile fashion   Anesthesia (see MAR for exact dosages):    Anesthesia method:  Local infiltration   Local anesthetic:  Lidocaine 1% w/o epi and lidocaine 1% WITH epi Procedure details:    Ultrasound guidance: yes     Puncture site:  R lower quadrant   Fluid appearance:  Amber   Dressing:  4x4 sterile gauze Post-procedure details:    Patient  tolerance of procedure:  Tolerated well, no immediate complications   (including critical care time)  Medications Ordered in ED Medications - No data to display   Initial Impression / Assessment and Plan / ED Course  I have reviewed the triage vital signs and the nursing notes.  Pertinent labs & imaging results that were available during my care of the patient were reviewed by me and considered in my medical decision making (see chart for details).     Patient with history of alcoholic cirrhosis presenting for therapeutic paracentesis.  Will draw a routine labs to assess LFTs and PT/INR.  Plan for therapeutic paracentesis.  Bedside ultrasound guidance.      Final Clinical Impressions(s) / ED Diagnoses   Final diagnoses:  None    ED Discharge Orders    None       Garnette Gunner, MD 10/02/18 2357    Gwyneth Sprout, MD 10/04/18 [phone]

## 2018-10-02 NOTE — ED Triage Notes (Signed)
Patient [facility] with EMS [facility] reports increasing ascites/abdominal pressure this week , last paracentesis 4 weeks ago , denies pain , no fever or chills .

## 2018-10-02 NOTE — ED Notes (Addendum)
EDP performed paracentesis/inserted drain tube at abdominal wall , currently draining , denies abdominal pain /respirations unlabored.

## 2018-10-03 NOTE — ED Notes (Signed)
PTAR CALLED  °

## 2018-10-18 ENCOUNTER — Other Ambulatory Visit (HOSPITAL_COMMUNITY): Payer: Self-pay | Admitting: Internal Medicine

## 2018-10-18 ENCOUNTER — Other Ambulatory Visit (HOSPITAL_COMMUNITY): Payer: Self-pay | Admitting: Gastroenterology

## 2018-10-18 DIAGNOSIS — R188 Other ascites: Secondary | ICD-10-CM

## 2018-10-19 ENCOUNTER — Inpatient Hospital Stay (HOSPITAL_COMMUNITY): Admission: RE | Admit: 2018-10-19 | Payer: Self-pay | Source: Ambulatory Visit

## 2018-10-19 ENCOUNTER — Ambulatory Visit (HOSPITAL_COMMUNITY)
Admission: RE | Admit: 2018-10-19 | Discharge: 2018-10-19 | Disposition: A | Discharge status: Home / Self Care | Payer: Medicaid Other | Source: Ambulatory Visit | Attending: Internal Medicine | Admitting: Internal Medicine

## 2018-10-19 ENCOUNTER — Encounter (HOSPITAL_COMMUNITY): Payer: Self-pay | Admitting: Student

## 2018-10-19 DIAGNOSIS — R188 Other ascites: Secondary | ICD-10-CM | POA: Diagnosis not present

## 2018-10-19 HISTORY — PX: IR PARACENTESIS: IMG2679

## 2018-10-19 LAB — BODY FLUID CELL COUNT WITH DIFFERENTIAL
Eos, Fluid: 0 %
Lymphs, Fluid: 4 %
Monocyte-Macrophage-Serous Fluid: 31 % — ABNORMAL LOW (ref 50–90)
Neutrophil Count, Fluid: 65 % — ABNORMAL HIGH (ref 0–25)
Total Nucleated Cell Count, Fluid: 510 cu mm (ref 0–1000)

## 2018-10-19 MED ORDER — ALBUMIN HUMAN 25 % IV SOLN
INTRAVENOUS | Status: AC
  Filled 2018-10-19: qty 100

## 2018-10-19 MED ORDER — ALBUMIN HUMAN 25 % IV SOLN
50.0000 g | Freq: Once | INTRAVENOUS | Status: AC
  Administered 2018-10-19: 25 g via INTRAVENOUS

## 2018-10-19 MED ORDER — LIDOCAINE HCL 1 % IJ SOLN
INTRAMUSCULAR | Status: AC
  Filled 2018-10-19: qty 20

## 2018-10-19 NOTE — Procedures (Signed)
PROCEDURE SUMMARY:  Successful image-guided paracentesis from the right lower abdomen.  Yielded 5 liters of clear yellow fluid.  No immediate complications.  Patient tolerated well.   Specimen was sent for labs.  Elwin Mochalexandra Emelynn Rance PA-C 10/19/2018 12:56 PM

## 2018-10-21 LAB — PATHOLOGIST SMEAR REVIEW

## 2018-10-28 ENCOUNTER — Encounter: Payer: Self-pay | Admitting: Gastroenterology

## 2018-10-28 ENCOUNTER — Ambulatory Visit (INDEPENDENT_AMBULATORY_CARE_PROVIDER_SITE_OTHER): Payer: Self-pay | Admitting: Gastroenterology

## 2018-10-28 ENCOUNTER — Other Ambulatory Visit (INDEPENDENT_AMBULATORY_CARE_PROVIDER_SITE_OTHER): Payer: Self-pay

## 2018-10-28 VITALS — BP 90/60 | HR 80 | Ht 66.75 in | Wt 178.1 lb

## 2018-10-28 DIAGNOSIS — I8500 Esophageal varices without bleeding: Secondary | ICD-10-CM

## 2018-10-28 DIAGNOSIS — K7290 Hepatic failure, unspecified without coma: Secondary | ICD-10-CM

## 2018-10-28 DIAGNOSIS — K7031 Alcoholic cirrhosis of liver with ascites: Secondary | ICD-10-CM

## 2018-10-28 DIAGNOSIS — K7682 Hepatic encephalopathy: Secondary | ICD-10-CM

## 2018-10-28 LAB — COMPREHENSIVE METABOLIC PANEL
ALT: 51 U/L (ref 0–53)
AST: 77 U/L — ABNORMAL HIGH (ref 0–37)
Albumin: 2 g/dL — ABNORMAL LOW (ref 3.5–5.2)
Alkaline Phosphatase: 153 U/L — ABNORMAL HIGH (ref 39–117)
BUN: 8 mg/dL (ref 6–23)
CO2: 26 mEq/L (ref 19–32)
Calcium: 8.2 mg/dL — ABNORMAL LOW (ref 8.4–10.5)
Chloride: 104 mEq/L (ref 96–112)
Creatinine, Ser: 0.75 mg/dL (ref 0.40–1.50)
GFR: 113.87 mL/min (ref >60.00)
Glucose, Bld: 109 mg/dL — ABNORMAL HIGH (ref 70–99)
Potassium: 4.4 mEq/L (ref 3.5–5.1)
SODIUM: 134 meq/L — AB (ref 135–145)
Total Bilirubin: 2.7 mg/dL — ABNORMAL HIGH (ref 0.2–1.2)
Total Protein: 5.4 g/dL — ABNORMAL LOW (ref 6.0–8.3)

## 2018-10-28 LAB — AFP TUMOR MARKER: AFP-Tumor Marker: 2 ng/mL (ref <6.1)

## 2018-10-28 LAB — PROTIME-INR
INR: 1.8 ratio — ABNORMAL HIGH (ref 0.8–1.0)
Prothrombin Time: 20.7 s — ABNORMAL HIGH (ref 9.6–13.1)

## 2018-10-28 MED ORDER — ALBUMIN HUMAN 25 % IV SOLN: 50.0000 g | Freq: Once | INTRAVENOUS | Status: DC

## 2018-10-28 NOTE — Progress Notes (Addendum)
[facility] Progress Note  Chief Complaint: End-stage liver disease with large volume ascites  Subjective  History:  I last saw Kenneth [name] in mid October. He has ongoing struggles with large volume ascites requiring paracentesis despite having had a successful TIPS placement earlier this year.  He was also  hospitalized 2 months ago for hepatic encephalopathy.  I discussed his case with my partner Dr. Adela LankArmbruster who saw him during that hospitalization. He was in the emergency department on 10/02/2018 for abdominal discomfort from large volume ascites, and a 4 L bedside paracentesis was done. Review of MAR from his rehab facility shows heads as noted below.  His diuretics have been maintained at spironolactone 100 mg daily and furosemide 40 mg daily he is also taking lactulose, rifaximin, as needed morphine and other meds as listed.  He currently resides at term care facility, and believes he may be there indefinitely unless there is major improvement in his health.  He is therefore taking all medicines faithfully.  [name] feels that his mental status is improved on the lactulose and rifaximin, and he is having 2 or 3 soft BMs per day. He is still dealing with large volume ascites that causes discomfort and dyspnea.  It seems that the plan for regularly scheduled paracentesis has been lost among his transitions of care.  ROS: Cardiovascular:  no chest pain Respiratory: no dyspnea Generalized fatigue Difficulty sleeping at times Depression now managed better on medicines Occasional lightheadedness when he stands  Systems negative except as above  The patient's Past Medical, Family and Social History were reviewed and are on file in the EMR.  Objective:  Med list reviewed  Current Outpatient Medications:  .  buPROPion (WELLBUTRIN SR) 100 MG 12 hr tablet, Take 100 mg by mouth daily., Disp: , Rfl:  .  diclofenac sodium (VOLTAREN) 1 % GEL, Apply 4 g topically 4 (four) times daily.  (Patient taking differently: Apply 1 application topically 4 (four) times daily as needed (pain). ), Disp: 100 g, Rfl: 0 .  ergocalciferol (DRISDOL) 50000 units capsule, Take 1 capsule (50,000 Units total) by mouth once a week. (Patient taking differently: Take 50,000 Units by mouth every Wednesday. ), Disp: 9 capsule, Rfl: 1 .  FLUoxetine (PROZAC) 20 MG tablet, Take 20 mg by mouth daily., Disp: , Rfl:  .  furosemide (LASIX) 40 MG tablet, Take 1 tablet (40 mg total) by mouth daily., Disp: 30 tablet, Rfl: 1 .  gabapentin (NEURONTIN) 300 MG capsule, Take 300 mg by mouth at bedtime., Disp: , Rfl:  .  lactulose (CHRONULAC) 10 GM/15ML solution, Take 67.5 mLs (45 g total) by mouth 3 (three) times daily. (Patient taking differently: Take 45 g by mouth 4 (four) times daily. ), Disp: 500 mL, Rfl: 1 .  Lidocaine 4 % PTCH, Apply 1 patch topically. Both legs twice a day as needed for pain, Disp: , Rfl:  .  midodrine (PROAMATINE) 5 MG tablet, Take 0.5 tablets (2.5 mg total) by mouth 3 (three) times daily with meals. (Patient taking differently: Take 2.5 mg by mouth 3 (three) times daily. 9a, 2p, 9p), Disp: 90 tablet, Rfl: 1 .  Morphine Sulfate (ROXANOL PO), Take 0.25 mLs by mouth every 6 (six) hours as needed., Disp: , Rfl:  .  Multiple Vitamin (MULTIVITAMIN WITH MINERALS) TABS tablet, Take 1 tablet by mouth daily., Disp: 30 tablet, Rfl: 0 .  ondansetron (ZOFRAN) 4 MG tablet, Take 1 tablet (4 mg total) by mouth every 8 (eight) hours as  needed for nausea or vomiting., Disp: 60 tablet, Rfl: 3 .  Potassium Chloride ER 20 MEQ TBCR, Take 40 mEq by mouth daily. (Patient taking differently: Take 20 mEq by mouth daily. ), Disp: 30 tablet, Rfl: 0 .  rifaximin (XIFAXAN) 550 MG TABS tablet, Take 1 tablet (550 mg total) by mouth 2 (two) times daily., Disp: 60 tablet, Rfl: 1 .  spironolactone (ALDACTONE) 100 MG tablet, Take 1 tablet (100 mg total) by mouth daily. (Patient taking differently: Take 50 mg by mouth daily. ), Disp:  30 tablet, Rfl: 0 .  nicotine (NICODERM CQ - DOSED IN MG/24 HOURS) 21 mg/24hr patch, Place 21 mg onto the skin daily., Disp: , Rfl:   Current Facility-Administered Medications:  .  [START ON 10/29/2018] albumin human 25 % solution 50 g, 50 g, Intravenous, Once, Danis, Starr Lake III, MD   Vital signs in last 24 hrs: Vitals:   10/28/18 1100  BP: 90/60  Pulse: 80    Physical Exam  Chronically ill-appearing man, poor muscle mass.  Large volume ascites and now with peripheral edema.  He is mentating well, ambulates well, steady on his feet and gets on exam table without assistance.  HEENT: sclera mildly icteric, oral mucosa moist without lesions  Neck: supple, no thyromegaly, JVD or lymphadenopathy  Cardiac: RRR without murmurs, S1S2 heard, + peripheral edema  Pulm: clear to auscultation bilaterally, normal RR and effort noted  Abdomen: soft, large volume ascites without tenderness, with active bowel sounds.  Left lobe liver enlarged, cannot appreciate spleen tip but limited by abdominal girth.  Skin; warm and dry, mild jaundice Neuro: No asterixis, mentation clear, speech fluent  Recent Labs:  CMP Latest Ref Rng & Units 10/02/2018 09/04/2018 09/03/2018  Glucose 70 - 99 mg/dL 85 89 450(T)  BUN 6 - 20 mg/dL <8(U) <8(K) 7  Creatinine 0.61 - 1.24 mg/dL 8.00 3.49 1.79  Sodium 135 - 145 mmol/L 130(L) 135 141  Potassium 3.5 - 5.1 mmol/L 3.6 3.5 2.7(LL)  Chloride 98 - 111 mmol/L 97(L) 105 108  CO2 22 - 32 mmol/L 25 26 28   Calcium 8.9 - 10.3 mg/dL 8.0(L) 8.6(L) 9.1  Total Protein 6.5 - 8.1 g/dL 6.0(L) 5.4(L) 5.8(L)  Total Bilirubin 0.3 - 1.2 mg/dL 1.5(A) 5.2(H) 5.3(H)  Alkaline Phos 38 - 126 U/L 172(H) 132(H) 142(H)  AST 15 - 41 U/L 93(H) 78(H) 85(H)  ALT 0 - 44 U/L 63(H) 59(H) 61(H)   INR 1.75 on 10/02/2018 which is down from its highest level of 2.6 in late October and back to what it was on September 9.   MELD = 22   Radiologic studies:  He underwent 5 L therapeutic paracentesis  on 10/19/2018  @ASSESSMENTPLANBEGIN @ Assessment: Encounter Diagnoses  Name Primary?  . Alcoholic cirrhosis of liver with ascites (HCC) Yes  . Encephalopathy, hepatic (HCC)   . Esophageal varices without bleeding, unspecified esophageal varices type (HCC)   . Ascites due to alcoholic cirrhosis (HCC)     End-stage liver disease, he has had a long period of abstinence from alcohol.  His condition decompensated earlier this year for unclear reasons, ascites was initially much improved after TIPS placement but then recurred as bad as it was prior to TIPS placement.  His ongoing hypotension despite midodrine therapy precludes increase of diuretic doses.  He remains at high risk for renal decompensation as evidenced by chronic low sodium level.  Esophageal varices were banded by Dr. Loreta Ave November 2015, unknown if they are still present after TIPS placement.  However, even if present now, I believe his hypotension and renal volume sensitivity would preclude beta-blocker therapy.  Therefore, I have not scheduled a repeat upper endoscopy.    He appears to have received a flu shot during recent hospitalization.  Plan: We have arranged weekly paracentesis for the next 4 weeks, where up to 5 L can be removed along with administration of 50 g of 25% IV albumin.  No studies are needed on the fluid. He will have this paracentesis done weekly for the next 4 weeks, then we will see him in clinic follow-up and make a further plan regarding the optimal interval for paracentesis at that point.  Maintain current doses of other medicines.  I will make an inquiry to the [facility] transplant hepatology clinic to see if they are agreeable to evaluating him.  He did not show for 2 prior visits with them earlier this year.  CMP, INR 4 up-to-date meld score, and AFP for hepatoma screening.  His last AFP was 5 months ago.  I instructed his care facility to continue 2000 mg daily sodium restriction, but to lift his  daily fluid restriction.  It is not necessary unless his serum sodium is consistently 126 or below.  Total time 45 minutes, over half spent face-to-face with patient in counseling and coordination of care.   Charlie Pitter III  .  Addendum for today's labs:  CMP Latest Ref Rng & Units 10/28/2018 10/02/2018 09/04/2018  Glucose 70 - 99 mg/dL 546(T) 85 89  BUN 6 - 23 mg/dL 8 <0(P) <5(W)  Creatinine 0.40 - 1.50 mg/dL 6.56 8.12 7.51  Sodium 135 - 145 mEq/L 134(L) 130(L) 135  Potassium 3.5 - 5.1 mEq/L 4.4 3.6 3.5  Chloride 96 - 112 mEq/L 104 97(L) 105  CO2 19 - 32 mEq/L 26 25 26   Calcium 8.4 - 10.5 mg/dL 8.2(L) 8.0(L) 8.6(L)  Total Protein 6.0 - 8.3 g/dL 7.0(Y) 6.0(L) 5.4(L)  Total Bilirubin 0.2 - 1.2 mg/dL 2.7(H) 3.5(H) 5.2(H)  Alkaline Phos 39 - 117 U/L 153(H) 172(H) 132(H)  AST 0 - 37 U/L 77(H) 93(H) 78(H)  ALT 0 - 53 U/L 51 63(H) 59(H)   INR 1.8  AFP pending  - HD

## 2018-10-28 NOTE — Patient Instructions (Addendum)
You have been scheduled for an abdominal paracentesis at [facility] radiology (1st floor of hospital) on 10-29-2018 at 9 am. Please arrive at least 15 minutes prior to your appointment time for registration. Should you need to reschedule this appointment for any reason, please call our office at [phone].  You will have weekly paracentesis at [facility].   Your provider has requested that you go to the basement level for lab work before leaving today. Press "B" on the elevator. The lab is located at the first door on the left as you exit the elevator.

## 2018-10-29 ENCOUNTER — Ambulatory Visit (HOSPITAL_COMMUNITY)
Admission: RE | Admit: 2018-10-29 | Discharge: 2018-10-29 | Disposition: A | Discharge status: Home / Self Care | Payer: Medicaid Other | Source: Ambulatory Visit | Attending: Gastroenterology | Admitting: Gastroenterology

## 2018-10-29 ENCOUNTER — Encounter (HOSPITAL_COMMUNITY): Payer: Self-pay | Admitting: Radiology

## 2018-10-29 DIAGNOSIS — R188 Other ascites: Secondary | ICD-10-CM | POA: Diagnosis present

## 2018-10-29 DIAGNOSIS — K7031 Alcoholic cirrhosis of liver with ascites: Secondary | ICD-10-CM

## 2018-10-29 DIAGNOSIS — K7460 Unspecified cirrhosis of liver: Secondary | ICD-10-CM | POA: Diagnosis not present

## 2018-10-29 HISTORY — PX: IR PARACENTESIS: IMG2679

## 2018-10-29 MED ORDER — ALBUMIN HUMAN 25 % IV SOLN
50.0000 g | Freq: Once | INTRAVENOUS | Status: AC
  Administered 2018-10-29: 50 g via INTRAVENOUS

## 2018-10-29 MED ORDER — ALBUMIN HUMAN 25 % IV SOLN
INTRAVENOUS | Status: AC
  Filled 2018-10-29: qty 200

## 2018-10-29 MED ORDER — LIDOCAINE HCL 1 % IJ SOLN
INTRAMUSCULAR | Status: AC
  Filled 2018-10-29: qty 20

## 2018-10-29 MED ORDER — LIDOCAINE HCL 1 % IJ SOLN
INTRAMUSCULAR | Status: DC | PRN
  Administered 2018-10-29: 10 mL

## 2018-10-29 NOTE — Procedures (Signed)
PROCEDURE SUMMARY:  Successful US guided paracentesis from RLQ.  Yielded 5 L of clear yellow fluid.  No immediate complications.  Pt tolerated well.   Specimen was not sent for labs.  EBL < 87mL  Brayton El PA-C 10/29/2018 9:16 AM

## 2018-11-02 ENCOUNTER — Telehealth: Payer: Self-pay | Admitting: Gastroenterology

## 2018-11-02 ENCOUNTER — Other Ambulatory Visit: Payer: Self-pay

## 2018-11-02 ENCOUNTER — Encounter: Payer: Self-pay | Admitting: Gastroenterology

## 2018-11-02 DIAGNOSIS — K7031 Alcoholic cirrhosis of liver with ascites: Secondary | ICD-10-CM

## 2018-11-02 MED ORDER — ALBUMIN HUMAN 25 % IV SOLN: 50.0000 g | Freq: Once | INTRAVENOUS | Status: DC

## 2018-11-02 NOTE — Telephone Encounter (Signed)
Pt states that Dr. Myrtie Neither told him that he needs a paracentesis every week. He had one last week and needs to schedule one for this one. Pls call him.

## 2018-11-02 NOTE — Telephone Encounter (Signed)
Yes,at the time of last week's visit I ordered weekly paracentesis of up to 7 liters, along with 50 grams of IV albumin at each session, for 4 weeks (starting with the one done Jan 3rd).  It seems this was not scheduled as ordered.   Please do so, thanks.

## 2018-11-02 NOTE — Telephone Encounter (Signed)
Spoke with [facility] and the pt regarding the appts. Pt scheduled for IR para at [facility] 1/10@10am , 9:45am arrival, 1/17@9am -8:45am arrival, 1/24@9am -8:45am arrival. Pt and facility aware of appts.

## 2018-11-02 NOTE — Telephone Encounter (Signed)
Pt calling stating he is supposed to have weekly para. Only one was scheduled for 10/29/18. Please advise regarding orders. Per OV note it does state weekly para.

## 2018-11-03 ENCOUNTER — Other Ambulatory Visit: Payer: Self-pay | Admitting: Emergency Medicine

## 2018-11-03 ENCOUNTER — Telehealth: Payer: Self-pay

## 2018-11-03 NOTE — Telephone Encounter (Signed)
As per Kenneth [name], [facility], the patient's referral remains active.  

## 2018-11-04 IMAGING — US IR PARACENTESIS
1 series · 2 of 2 positions shown · non-contrast
Comparison: none

INDICATION: Patient with history of alcoholic cirrhosis with recurrent ascites.
Request is made for therapeutic paracentesis up to 4 liter maximum.
Patient to receive Albumin with procedure today.

[Series 1: ir (id) (id)/(id)/(id) ir · 2 of 2 slices shown]
[im 1/2]
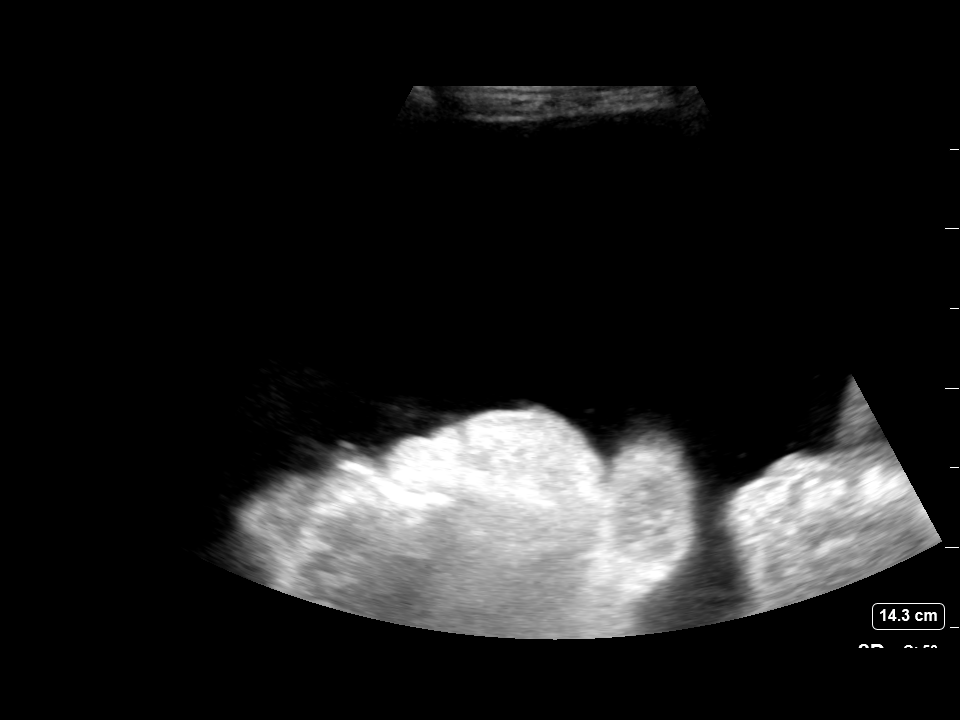
[im 2/2]
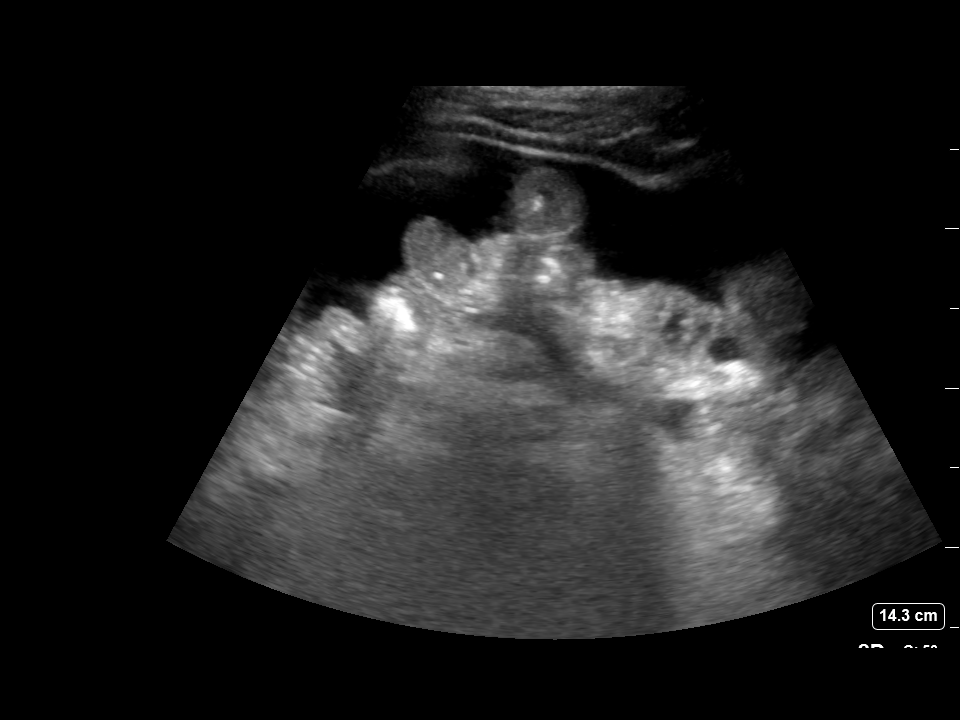

[2 of 2 positions shown; findings below may reference images not displayed]

EXAM:
ULTRASOUND GUIDED THERAPEUTIC PARACENTESIS

MEDICATIONS:
10 milliliters of 2% lidocaine

COMPLICATIONS:
None immediate.

PROCEDURE:
Informed written consent was obtained from the patient after a
discussion of the risks, benefits and alternatives to treatment. A
timeout was performed prior to the initiation of the procedure.

Initial ultrasound scanning demonstrates a large amount of ascites
within the right lower abdominal quadrant. The right lower abdomen
was prepped and draped in the usual sterile fashion. 1% lidocaine
with epinephrine was used for local anesthesia.

Following this, a 19 gauge, 7-cm, Yueh catheter was introduced. An
ultrasound image was saved for documentation purposes. The
paracentesis was performed. The catheter was removed and a dressing
was applied. The patient tolerated the procedure well without
immediate post procedural complication.
FINDINGS: A total of approximately 4 liters of clear yellow fluid was removed.
IMPRESSION: Successful ultrasound-guided paracentesis yielding 4 liters of
peritoneal fluid.

## 2018-11-05 ENCOUNTER — Encounter (HOSPITAL_COMMUNITY): Payer: Self-pay | Admitting: Physician Assistant

## 2018-11-05 ENCOUNTER — Ambulatory Visit (HOSPITAL_COMMUNITY)
Admission: RE | Admit: 2018-11-05 | Discharge: 2018-11-05 | Disposition: A | Discharge status: Home / Self Care | Payer: Medicaid Other | Source: Ambulatory Visit | Attending: Gastroenterology | Admitting: Gastroenterology

## 2018-11-05 DIAGNOSIS — K7031 Alcoholic cirrhosis of liver with ascites: Secondary | ICD-10-CM | POA: Insufficient documentation

## 2018-11-05 HISTORY — PX: IR PARACENTESIS: IMG2679

## 2018-11-05 MED ORDER — ALBUMIN HUMAN 25 % IV SOLN
INTRAVENOUS | Status: AC
  Filled 2018-11-05: qty 200

## 2018-11-05 MED ORDER — LIDOCAINE HCL 1 % IJ SOLN
INTRAMUSCULAR | Status: DC | PRN
  Administered 2018-11-05: 10 mL

## 2018-11-05 MED ORDER — LIDOCAINE HCL 1 % IJ SOLN
INTRAMUSCULAR | Status: AC
  Filled 2018-11-05: qty 20

## 2018-11-05 MED ORDER — ALBUMIN HUMAN 25 % IV SOLN
50.0000 g | Freq: Once | INTRAVENOUS | Status: AC
  Administered 2018-11-05: 50 g via INTRAVENOUS

## 2018-11-05 NOTE — Procedures (Signed)
PROCEDURE SUMMARY:  Successful US guided paracentesis from right lateral abdomen.  Yielded 7 L of clear yellow fluid.  No immediate complications.  Patient tolerated well.  EBL = trace  Darlinda Bellows S Asti Mackley PA-C 11/05/2018 11:49 AM

## 2018-11-10 ENCOUNTER — Telehealth: Payer: Self-pay

## 2018-11-10 NOTE — Telephone Encounter (Signed)
Received call from Dr. Keane Police office and pt is scheduled to see Dr. Hamilton Capri 11/17/18@2 :45pm. Pt was notified of appt by Dr. Keane Police office. Per office request pts last [place] sent via disc to Dr. Hamilton Capri. Spoke with Rebecka Apley in the file room regarding having disc sent.

## 2018-11-12 ENCOUNTER — Encounter (HOSPITAL_COMMUNITY): Payer: Self-pay | Admitting: Physician Assistant

## 2018-11-12 ENCOUNTER — Ambulatory Visit (HOSPITAL_COMMUNITY)
Admission: RE | Admit: 2018-11-12 | Discharge: 2018-11-12 | Disposition: A | Discharge status: Home / Self Care | Payer: Medicaid Other | Source: Ambulatory Visit | Attending: Gastroenterology | Admitting: Gastroenterology

## 2018-11-12 DIAGNOSIS — K7031 Alcoholic cirrhosis of liver with ascites: Secondary | ICD-10-CM | POA: Diagnosis present

## 2018-11-12 HISTORY — PX: IR PARACENTESIS: IMG2679

## 2018-11-12 MED ORDER — LIDOCAINE HCL 1 % IJ SOLN
INTRAMUSCULAR | Status: DC | PRN
  Administered 2018-11-12: 10 mL

## 2018-11-12 MED ORDER — ALBUMIN HUMAN 25 % IV SOLN
INTRAVENOUS | Status: AC
  Administered 2018-11-12: 50 g via INTRAVENOUS
  Filled 2018-11-12: qty 100

## 2018-11-12 MED ORDER — LIDOCAINE HCL 1 % IJ SOLN
INTRAMUSCULAR | Status: AC
  Filled 2018-11-12: qty 20

## 2018-11-12 MED ORDER — ALBUMIN HUMAN 25 % IV SOLN
50.0000 g | Freq: Once | INTRAVENOUS | Status: AC
  Administered 2018-11-12: 50 g via INTRAVENOUS

## 2018-11-12 NOTE — Procedures (Signed)
PROCEDURE SUMMARY:  Successful image-guided paracentesis from the left lower abdomen.  Yielded 6.4 liters of clear yellow fluid.  No immediate complications.  EBL: zero Patient tolerated well.   Specimen was not sent for labs.  Please see imaging section of Epic for full dictation.  Villa HerbShannon A Osha Rane PA-C 11/12/2018 9:24 AM

## 2018-11-16 ENCOUNTER — Telehealth: Payer: Self-pay | Admitting: Gastroenterology

## 2018-11-16 NOTE — Telephone Encounter (Signed)
[facility] referr[facility] for Dr. Keane Police that was made for the pt they are not able to transport the pt that far. However if the referral can be sent to [facility] [address] [place], [place], [place] [address] [phone] they will be able to see the patient and the pt have transportation.

## 2018-11-16 NOTE — Telephone Encounter (Signed)
Pt is scheduled to see Dr. Hamilton Capri tomorrow in [place]. Received call from rehab facility where pt is a resident and they cannot transport him to the appointment. Discussed with rehab facility that pt was supposed to go to appt with family. Per rehab facility pts family is not taking the pt and is not planning on going to the appt with the pt. DON of rehab facility called Dr. Keane Police office and was told if we move the appt to [facility] location they can transport him there. Please advise.

## 2018-11-17 NOTE — Telephone Encounter (Signed)
Called [place] office to try and set up an appt with Dr. Hamilton Capri at that location. Waiting on a call back regarding this referral. Confirmed with Cheyenne Adas that they can take him to the [facility] location and Mr. Mcchesney states that his family would go to [place] appt if we can get it scheduled.

## 2018-11-17 NOTE — Telephone Encounter (Signed)
This is extremely unfortunate, since we were clear in the initial referral plans. And we informed his facility well in advance of these plans.  The decisions for a [facility] visit and family present were Dr. Keane Police, not mine.  He insisted upon it since the patient did not show for two prior visits.  Significant   Rehab would need to see if Zamor is agreeable to a local visit without family, but seems doubtful.  If so, this may be the end of Allah's chances for liver transplant evaluation due to lack of follow-through and adequate family support. Please communicate all of the above to his facility now.

## 2018-11-19 ENCOUNTER — Ambulatory Visit (HOSPITAL_COMMUNITY)
Admission: RE | Admit: 2018-11-19 | Discharge: 2018-11-19 | Disposition: A | Discharge status: Home / Self Care | Payer: Medicaid Other | Source: Ambulatory Visit | Attending: Gastroenterology | Admitting: Gastroenterology

## 2018-11-19 ENCOUNTER — Encounter (HOSPITAL_COMMUNITY): Payer: Self-pay | Admitting: Radiology

## 2018-11-19 DIAGNOSIS — K7031 Alcoholic cirrhosis of liver with ascites: Secondary | ICD-10-CM | POA: Diagnosis present

## 2018-11-19 HISTORY — PX: IR PARACENTESIS: IMG2679

## 2018-11-19 MED ORDER — LIDOCAINE HCL 1 % IJ SOLN
INTRAMUSCULAR | Status: AC | PRN
  Administered 2018-11-19: 10 mL

## 2018-11-19 MED ORDER — ALBUMIN HUMAN 25 % IV SOLN
50.0000 g | Freq: Once | INTRAVENOUS | Status: AC
  Administered 2018-11-19: 50 g via INTRAVENOUS

## 2018-11-19 MED ORDER — LIDOCAINE HCL 1 % IJ SOLN
INTRAMUSCULAR | Status: AC
  Filled 2018-11-19: qty 20

## 2018-11-19 MED ORDER — ALBUMIN HUMAN 25 % IV SOLN
INTRAVENOUS | Status: AC
  Administered 2018-11-19: 50 g via INTRAVENOUS
  Filled 2018-11-19: qty 200

## 2018-11-19 NOTE — Progress Notes (Signed)
PROCEDURE SUMMARY:  Successful US guided paracentesis from LLQ.  Yielded 4950 mL of clear yellow fluid.  No immediate complications.  Pt tolerated well.   Specimen was not sent for labs.  EBL < 66mLGilSt. Lukes'S Regional Medical CenPears35w6dTonna 409Col90lIllinoisInKentuckydJacquelK184 [place] DrChTompkin74OvArlana PouCatarina HaUAL CMarland Kitchen4ElectricKentuckyAnderson Endoscopy CentePUAL CMedical Center Of South3 ArK84mirGilAspen Valley HospiPears74w6dTonna 409Col56lIllinoisInKentuckydJacquelK884 Acacia ChIs26OvArlana PouCatarina HaUAL CMarland Kitchen44ElectricKentuckyDesoto Surgery CentePUAL CLake View Memorial76 HoK88mirGilElite Surgical Center Pears53w6dTonna 409Col11lIllinoisInKentuckydJacquelK514 53rd AChCollin90OvArlana PouCatarina HaUAL CMarland Kitchen7ElectricKentuckyBarbourville Arh HospitaPUAL CRockwall Ambulatory Surgery C42entK30mirGilCatalina Surgery CenPears19w6dTonna 409Col21lIllinoisInKentuckydJacquelK3 Sycamore ChGrey48OvArlana PouCatarina HaUAL CMarland Kitchen56ElectricKentuckyCandler County HospitaPUAL CGreater Baltimore Medic78al K39mirGilUnited Surgery CenPears43w6dTonna 409Col54lIllinoisInKentuckydJacquelK224 [place] ChRiv17OvArlana PouCatarina HaUAL CMarland Kitchen61o086DriElectricKentuckyMercy St. Francis HospitaPUAL CSt Croix Re31g MK85mirGilProvidence Kodiak Island Medical CenPears63w6dTonna 409Col65lIllinoisInKentuckydJacquelK400 Essex LC53OvArlana PouCatarina HaUAL CMarland Kitchen43oElectricKentuckyMethodist Mckinney HospitaPUAL CMemorial Hermann Northeast73 HoK14mirGilBryan W. Whitfield Memorial HospiPears57w6dTonna 409Col52lIllinoisInKentuckydJacquelK743 [place] C48OvArlana PouCatarina HaUAL CMarland Kitchen66o08ElectricKentuckyCampbellton-Graceville HospitaPUAL CEliza Coffee Memorial76 HoK39mirGilMedical City DenPears15w6dTonna 409Col71lIllinoisInKentuckydJacquelK9260 Hickory AChWilliams84OvArlana PouCatarina HaUAL CMarland Kitchen62o0ElectricKentuckyPiedmont EyPUAL CBaylor Medical Center At W74axaK33mirGilLaird HospiPears72w6dTonna 409Col74lIllinoisInKentuckydJacquelK1 Jefferson LChB31OvArlana PouCatarina HaUAL CMarland Kitchen1o0ElectricKentuckyMiddle Park Medical CentePUAL CSurgical Special71ty K75mirGilHilo Medical CenPears31w6dTonna 409Col71lIllinoisInKentuckydJacquelK8652 [place] ChM25OvArlana PouCatarina HaUAL CMarland KitcheElectricKentuckyDelware Outpatient Center For SurgerPUAL CLakeside Su21rgeK34mirGilTwo Rivers Behavioral Health SysPears71w6dTonna 409Col74lIllinoisInKentuckydJacquelK7862 [place] ChC60OvArlana PouCatarina HaUAL CMarland KitchElectricKentuckyClifton Springs HospitaPUAL CGreenville Surgery C20entK13mirGilHoag Endoscopy Center IrvPears22w6dTonna 409Col40lIllinoisInKentuckydJacquelK8 Alderwood StrChGarde7OvArlana PouCatarina HaUAL CMarland Kitchen9ElectricKentuckyPinehurst Medical Clinic InPUAL CSturgis Regional27 HoK75mirGilSister Emmanuel HospiPears64w6dTonna 409Col65lIllinoisInKentuckydJacquelK8963 Rockland LChBate25OvArlana PouCatarina HaUAL CMarland Kitchen6ElectricKentuckyRhode Island HospitaPUAL CHouston Medic66al K39mirGilSouthview HospiPears103w6dTonna 409Col44lIllinoisInKentuckydJacquelK7541 Summerhouse ChCano23OvArlana PouCatarina HaUAL CMarland KitcheElectricKentuckyFoundation Surgical Hospital Of El PasPUAL CTristar Horizon Medic64al K26mirGilHealtheast Bethesda HospiPears20w6dTonna 409Col34lIllinoisInKentuckydJacquelK7 Bear Hill DrChG60OvArlana PouCatarina HaUAL CMarland Kitchen38o0ElectricKentuckyLake Martin Community HospitaPUAL CPocahontas Community68 HoK81mirGilEncompass Health Rehabilitation HospiPears78w6dTonna 409Col8lIllinoisInKentuckydJacquelK6 W. Sierra AChCa65OvArlana PouCatarina HaUAL CMarland KitElectricKentuckyCrenshaw Community HospitaPUAL CSaint Luke'S Hospital Of Ka22nsaK75mirGilThe Endoscopy Center NoPears64w6dTonna 409Col71lIllinoisInKentuckydJacquelK87 High Ridge CoChOcean47OvArlana PouCatarina HaUAL CMarland Kitchen43o086LElectricKentuckyCrestwood Medical CentePUAL CEncompass Health Rehabilitation Hospital Of 11SewK31mirGilPeconic Bay Medical CenPears35w6dTonna 409Col69lIllinoisInKentuckydJacquelK8394 East 4th StrChInv99OvArlana PouCatarina HaUAL CMarland KitchenElectricKentuckyMary Free Bed Hospital & Rehabilitation CentePUAL COnyx And Pearl Surgical S23uitK3mirGilPrime Surgical Suites Pears71w6dTonna 409Col59lIllinoisInKentuckydJacquelK189 Anderson ChSulliva60OvArlana PouCatarina HaUAL CMarland Kitchen74o086BlElectricKentuckyThibodaux Laser And Surgery Center LLPUAL CSelect Specialty Hospital-56St.K50mirGilW.G. (Bill) Hefner Salisbury Va [facility] (SalsbuPears45w6dTonna 409Col23lIllinoisInKentuckydJacquelK517 North Studebaker ChLoui84OvArlana PouCatarina HaUAL CMarland Kitchen2oElectricKentuckyCaromont Specialty SurgerPUAL CGrass Valley Surge96ry K80mirGilSouth Omaha Surgical Center Pears78w6dTonna 409Col35lIllinoisInKentuckydJacquelK235 S. Lantern AChPlatt58OvArlana PouCatarina HaUAL CMarland Kitchen53oElectricKentuckyClayton Cataracts And Laser Surgery CentePUAL CWest Paces Medic65al K62mirGilAdventist Healthcare White Oak Medical CenPears30w6dTonna 409Col84lIllinoisInKentuckydJacquelK8497 [address] [place] CoChFlagler29OvArlana PouCatarina HaUAL CMarland Kitchen51o086PeaElectricKentuckyOutpatient Services EasPUAL CCataract And Laser Center Asso29ciaKirkland Hune DillingaC 11/19/2018 9:51 AM

## 2018-11-22 NOTE — Telephone Encounter (Signed)
Called the [place] office to check on appt for Kenneth [name]. He is now scheduled to see [name] 12/22/18 @1 :30pm. Per office Dr. Hamilton Capri does not come to the [facility] office.

## 2018-11-25 ENCOUNTER — Ambulatory Visit: Payer: Self-pay | Admitting: Physician Assistant

## 2018-12-07 ENCOUNTER — Other Ambulatory Visit (INDEPENDENT_AMBULATORY_CARE_PROVIDER_SITE_OTHER): Payer: Medicaid Other

## 2018-12-07 ENCOUNTER — Ambulatory Visit: Payer: Medicaid Other | Admitting: Physician Assistant

## 2018-12-07 ENCOUNTER — Encounter: Payer: Self-pay | Admitting: Physician Assistant

## 2018-12-07 VITALS — BP 100/60 | HR 72 | Ht 67.0 in | Wt 162.4 lb

## 2018-12-07 DIAGNOSIS — K7290 Hepatic failure, unspecified without coma: Secondary | ICD-10-CM

## 2018-12-07 DIAGNOSIS — K7031 Alcoholic cirrhosis of liver with ascites: Secondary | ICD-10-CM

## 2018-12-07 DIAGNOSIS — K7682 Hepatic encephalopathy: Secondary | ICD-10-CM

## 2018-12-07 LAB — CBC WITH DIFFERENTIAL/PLATELET
Basophils Absolute: 0 10*3/uL (ref 0.0–0.1)
Basophils Relative: 0.8 % (ref 0.0–3.0)
Eosinophils Absolute: 0.1 10*3/uL (ref 0.0–0.7)
Eosinophils Relative: 2.6 % (ref 0.0–5.0)
HCT: 34.4 % — ABNORMAL LOW (ref 39.0–52.0)
Hemoglobin: 11.8 g/dL — ABNORMAL LOW (ref 13.0–17.0)
Lymphocytes Relative: 11.6 % — ABNORMAL LOW (ref 12.0–46.0)
Lymphs Abs: 0.6 10*3/uL — ABNORMAL LOW (ref 0.7–4.0)
MCHC: 34.4 g/dL (ref 30.0–36.0)
MCV: 92.3 fl (ref 78.0–100.0)
MONOS PCT: 18.9 % — AB (ref 3.0–12.0)
Monocytes Absolute: 1 10*3/uL (ref 0.1–1.0)
NEUTROS ABS: 3.5 10*3/uL (ref 1.4–7.7)
Neutrophils Relative %: 66.1 % (ref 43.0–77.0)
Platelets: 82 10*3/uL — ABNORMAL LOW (ref 150.0–400.0)
RBC: 3.73 Mil/uL — ABNORMAL LOW (ref 4.22–5.81)
RDW: 17.9 % — ABNORMAL HIGH (ref 11.5–15.5)
WBC: 5.2 10*3/uL (ref 4.0–10.5)

## 2018-12-07 LAB — COMPREHENSIVE METABOLIC PANEL
ALT: 41 U/L (ref 0–53)
AST: 65 U/L — ABNORMAL HIGH (ref 0–37)
Albumin: 2.6 g/dL — ABNORMAL LOW (ref 3.5–5.2)
Alkaline Phosphatase: 155 U/L — ABNORMAL HIGH (ref 39–117)
BUN: 7 mg/dL (ref 6–23)
CO2: 28 meq/L (ref 19–32)
Calcium: 9 mg/dL (ref 8.4–10.5)
Chloride: 107 mEq/L (ref 96–112)
Creatinine, Ser: 0.68 mg/dL (ref 0.40–1.50)
GFR: 119.92 mL/min (ref >60.00)
Glucose, Bld: 99 mg/dL (ref 70–99)
Potassium: 5.9 mEq/L — ABNORMAL HIGH (ref 3.5–5.1)
Sodium: 137 mEq/L (ref 135–145)
Total Bilirubin: 2.9 mg/dL — ABNORMAL HIGH (ref 0.2–1.2)
Total Protein: 5.8 g/dL — ABNORMAL LOW (ref 6.0–8.3)

## 2018-12-07 LAB — PROTIME-INR
INR: 1.7 ratio — ABNORMAL HIGH (ref 0.8–1.0)
Prothrombin Time: 20.2 s — ABNORMAL HIGH (ref 9.6–13.1)

## 2018-12-07 LAB — AMMONIA: Ammonia: 71 umol/L — ABNORMAL HIGH (ref 11–35)

## 2018-12-07 NOTE — Patient Instructions (Signed)
If you are age 57 or older, your body mass index should be between 23-30. Your Body mass index is 25.44 kg/m. If this is out of the aforementioned range listed, please consider follow up with your Primary Care Provider.  If you are age 17 or younger, your body mass index should be between 19-25. Your Body mass index is 25.44 kg/m. If this is out of the aformentioned range listed, please consider follow up with your Primary Care Provider.   Your provider has requested that you go to the basement level for lab work before leaving today. Press "B" on the elevator. The lab is located at the first door on the left as you exit the elevator.  You have been scheduled for an US guided paracentesis at [facility] on Thursday, Feb. 13, 2020 at 2 pm.  Please arrive 15 minutes prior to your appointment.  Continue current medications.  NEEDS TO BE ON 2 GM SODIUM DIET.  STOP FLUID RESTRICTIONS.  Thank you for choosing me and [facility].  Mike Gip, PA-

## 2018-12-08 ENCOUNTER — Encounter: Payer: Self-pay | Admitting: Physician Assistant

## 2018-12-08 NOTE — Progress Notes (Signed)
Subjective:    Patient ID: Kenneth [name], male    DOB: [birth year], 58 y.o.   MRN: [id number]  HPI Kenneth [name] is a pleasant 58 year old white male known to Dr. Loletha Carrow, who comes in today for follow-up of decompensated alcoholic cirrhosis.  Patient is currently residing in an assisted living facility/[facility]. Last office visit October 28, 2018 with Dr. Loletha Carrow.  Patient has been abstinent from alcohol for about 3 years had acute decompensation in his liver disease within the past 6 months for unclear reasons.  Developed refractory ascites, despite having prior TIPS.  He has had difficulty with persistent hypotension despite Midodrine. He was arranged for weekly paracentesis of 5 L over the past month.  He feels that paracentesis was very beneficial, and his weight is down from 1 78-1 62 today. Last paracentesis was done on 11/19/2018 little over 2 weeks ago. Ascites has reaccumulated and feels very tight though he denies any shortness of breath. He says overall he is feeling better , and wonders if he needs to stay in the assisted living facility. He has upcoming appointment with Dr Zollie Scale at their [place] office. On discussion with the patient today it does not appear that he has been on a low-sodium diet at the nursing facility, and his med record still has him on 1200 cc fluid restriction.  Most recent MELD January 2020 was 17.   Review of Systems Pertinent positive and negative review of systems were noted in the above HPI section.  All other review of systems was otherwise negative.  Outpatient Encounter Medications as of 12/07/2018  Medication Sig  . buPROPion (WELLBUTRIN SR) 100 MG 12 hr tablet Take 100 mg by mouth daily.  . diclofenac sodium (VOLTAREN) 1 % GEL Apply 4 g topically 4 (four) times daily. (Patient taking differently: Apply 1 application topically 4 (four) times daily as needed (pain). )  . ergocalciferol (DRISDOL) 50000 units capsule Take 1 capsule (50,000 Units total) by  mouth once a week. (Patient taking differently: Take 50,000 Units by mouth every Wednesday. )  . FLUoxetine (PROZAC) 20 MG tablet Take 20 mg by mouth daily.  . furosemide (LASIX) 40 MG tablet Take 1 tablet (40 mg total) by mouth daily.  Marland Kitchen gabapentin (NEURONTIN) 300 MG capsule Take 300 mg by mouth at bedtime.  Marland Kitchen lactulose (CHRONULAC) 10 GM/15ML solution Take 67.5 mLs (45 g total) by mouth 3 (three) times daily. (Patient taking differently: Take 45 g by mouth 4 (four) times daily. )  . Lidocaine 4 % PTCH Apply 1 patch topically. Both legs twice a day as needed for pain  . midodrine (PROAMATINE) 5 MG tablet Take 0.5 tablets (2.5 mg total) by mouth 3 (three) times daily with meals. (Patient taking differently: Take 2.5 mg by mouth 3 (three) times daily. 9a, 2p, 9p)  . Morphine Sulfate (ROXANOL PO) Take 0.25 mLs by mouth every 6 (six) hours as needed.  . Multiple Vitamin (MULTIVITAMIN WITH MINERALS) TABS tablet Take 1 tablet by mouth daily.  . nicotine (NICODERM CQ - DOSED IN MG/24 HOURS) 21 mg/24hr patch Place 21 mg onto the skin daily.  . ondansetron (ZOFRAN) 4 MG tablet Take 1 tablet (4 mg total) by mouth every 8 (eight) hours as needed for nausea or vomiting.  . Potassium Chloride ER 20 MEQ TBCR Take 40 mEq by mouth daily. (Patient taking differently: Take 20 mEq by mouth daily. )  . rifaximin (XIFAXAN) 550 MG TABS tablet Take 1 tablet (550 mg total) by mouth  2 (two) times daily.  Marland Kitchen spironolactone (ALDACTONE) 100 MG tablet Take 1 tablet (100 mg total) by mouth daily. (Patient taking differently: Take 50 mg by mouth daily. )   Facility-Administered Encounter Medications as of 12/07/2018  Medication  . albumin human 25 % solution 50 g  . albumin human 25 % solution 50 g  . albumin human 25 % solution 50 g  . albumin human 25 % solution 50 g   Allergies  Allergen Reactions  . Latex Swelling and Rash   Patient Active Problem List   Diagnosis Date Noted  . Acute liver failure with hepatic coma  (Kenneth [name])   . Malnutrition of moderate degree 08/17/2018  . Accelerated junctional rhythm 08/17/2018  . Hypokalemia 08/16/2018  . Hypomagnesemia 08/16/2018  . Hepatic encephalopathy ([place]) 08/15/2018  . Peripheral neuropathy 08/02/2018  . Ascites due to alcoholic cirrhosis ([place]) 09/81/1914  . Depression 12/18/2016  . Testosterone deficiency in male 08/11/2016  . Inguinal hernia 02/28/2016  . Rectal bleed 01/24/2016  . Umbilical hernia 78/29/5621  . Ascites 01/24/2016  . Tobacco abuse 01/24/2016  . Gynecomastia, male 01/24/2016  . Esophageal varices ([place]) 09/02/2014  . Portal hypertension with esophageal varices ([facility]) 09/02/2014  . Hiatal hernia 09/02/2014  . Leukopenia 09/02/2014  . Thrombocytopenia ([place]) 09/02/2014  . Alcoholic cirrhosis of liver with ascites ([facility])    Social History   Socioeconomic History  . Marital status: Divorced    Spouse name: Not on file  . Number of children: 1  . Years of education: Not on file  . Highest education level: Not on file  Occupational History  . Occupation: Development worker, international aid  Social Needs  . Financial resource strain: Not on file  . Food insecurity:    Worry: Not on file    Inability: Not on file  . Transportation needs:    Medical: Not on file    Non-medical: Not on file  Tobacco Use  . Smoking status: Current Every Day Smoker    Packs/day: 1.00    Years: 43.00    Pack years: 43.00    Types: Cigarettes  . Smokeless tobacco: Never Used  Substance and Sexual Activity  . Alcohol use: No    Comment: 09/02/2018 "no alcohol since 01/23/16"  . Drug use: Yes    Frequency: 5.0 times per week    Types: Marijuana    Comment: 09/02/2018 "nothing in the last month"  . Sexual activity: Not Currently  Lifestyle  . Physical activity:    Days per week: Not on file    Minutes per session: Not on file  . Stress: Not on file  Relationships  . Social connections:    Talks on phone: Not on file    Gets together: Not on file    Attends religious  service: Not on file    Active member of club or organization: Not on file    Attends meetings of clubs or organizations: Not on file    Relationship status: Not on file  . Intimate partner violence:    Fear of current or ex partner: Not on file    Emotionally abused: Not on file    Physically abused: Not on file    Forced sexual activity: Not on file  Other Topics Concern  . Not on file  Social History Narrative  . Not on file    Mr. Kroner's family history includes Hyperlipidemia in his father; Hypertension in his father; Leukemia in his father.      Objective:  Vitals:   12/07/18 1450  BP: 100/60  Pulse: 72  SpO2: 98%    Physical Exam; well-developed older white male in no acute distress, pleasant accompanied by his son height 5 foot 7, weight 162, BMI 25.4.  HEENT;nontraumatic normocephalic EOMI PERRLA sclera anicteric oral mucosa moist, Cardiovascular regular rate and rhythm with S1-S2, Pulmonary ;clear bilaterally, Abdomen protuberant with fairly tense ascites, nontender, bowel sounds present no palpable hepatosplenomegaly.  Rectal ;exam not done, Extremities; 1+ edema bilateral lower extremities to the knee.  Neuropsych ;alert and oriented, grossly nonfocal mood and affect appropriate no asterixis       Assessment & Plan:   #88 58 year old white male with decompensated alcohol induced cirrhosis with refractory ascites despite prior TIPS. He is improved, over the past month after weekly paracenteses of 5 L, and continued diuresis with Lasix 40 mg daily and Aldactone 100 mg daily. Weight down 16 pounds  #2 chronic hepatic encephalopathy-stable on current regimen, mentating well today #3 chronic hypotension-on Midodrine  Plan; Repeat labs today to include CBC, C-Met, INR and venous ammonia Will schedule for large-volume paracentesis later this week with removal of 5 L.  He will be given albumin 50 g.  No fluid studies needed. Will discuss with Dr. Loletha Carrow regarding  continuing scheduled paracenteses at every 2 week interval Continue Xifaxan 550 twice daily, continue lactulose 30 g 4 times daily Continue Lasix 40 daily Continue Aldactone 100 daily Continue Midodrine  2.5 mg 3 times daily We discussed necessity of 2 g sodium diet which we discussed in detail. Discontinue any fluid restriction Await transplant evaluation with [facility]  Amy Genia Harold PA-C 12/08/2018   Cc: Charlott Rakes, MD

## 2018-12-08 NOTE — Progress Notes (Signed)
____________________________________________________________  Attending physician addendum:  Thank you for sending this case to me. I have reviewed the entire note, and the outlined plan seems appropriate.  Yes, decrease paracentesis to every other week, hopefully we may be able to decrease even further if possible. His volume control appears better on the same diuretic regimen, probably from improved compliance since he is living at an extended care facility.  Unfortunately, his facility was unwilling to transport him to [facility] for an appointment with the [facility], and they report that his family was also unwilling to travel that far.  As that was a condition of his consult when I spoke with Dr.Zamor, I am surprised they were agreeable to seeing him at the local satellite clinic.  The previous noncompliance with consultation at [facility] hepatology so far does not bode well for his transplant consideration.  Kenneth Jupiter, MD  ____________________________________________________________

## 2018-12-09 ENCOUNTER — Other Ambulatory Visit: Payer: Self-pay | Admitting: Student

## 2018-12-09 ENCOUNTER — Observation Stay (HOSPITAL_COMMUNITY)
Admission: RE | Admit: 2018-12-09 | Discharge: 2018-12-09 | Disposition: A | Discharge status: Home / Self Care | Payer: Medicaid Other | Source: Ambulatory Visit | Attending: Physician Assistant | Admitting: Physician Assistant

## 2018-12-09 ENCOUNTER — Encounter (HOSPITAL_COMMUNITY): Payer: Self-pay

## 2018-12-09 ENCOUNTER — Ambulatory Visit (HOSPITAL_COMMUNITY)
Admission: RE | Admit: 2018-12-09 | Discharge: 2018-12-09 | Disposition: A | Discharge status: Home / Self Care | Payer: Medicaid Other | Source: Ambulatory Visit | Attending: Physician Assistant | Admitting: Physician Assistant

## 2018-12-09 DIAGNOSIS — R188 Other ascites: Secondary | ICD-10-CM | POA: Diagnosis not present

## 2018-12-09 DIAGNOSIS — K7031 Alcoholic cirrhosis of liver with ascites: Secondary | ICD-10-CM

## 2018-12-09 MED ORDER — SODIUM CHLORIDE 0.9 % IV SOLN
INTRAVENOUS | Status: AC
  Administered 2018-12-09: 16:00:00 via INTRAVENOUS

## 2018-12-09 MED ORDER — ALBUMIN HUMAN 25 % IV SOLN
50.0000 g | Freq: Once | INTRAVENOUS | Status: AC
  Administered 2018-12-09: 50 g via INTRAVENOUS
  Filled 2018-12-09 (×2): qty 200

## 2018-12-09 MED ORDER — LIDOCAINE HCL 1 % IJ SOLN
INTRAMUSCULAR | Status: AC
  Filled 2018-12-09: qty 20

## 2018-12-09 NOTE — Procedures (Signed)
PROCEDURE SUMMARY:  Successful image-guided paracentesis from the left lower abdomen.  Yielded 2.6 liters of hazy gold fluid.  No immediate complications.  EBL = 0. Patient tolerated well.   Specimen was not sent for labs.  Patient to receive Albumin today following procedure.  Elwin Mocha PA-C 12/09/2018 3:41 PM

## 2018-12-10 ENCOUNTER — Other Ambulatory Visit: Payer: Self-pay

## 2018-12-10 DIAGNOSIS — K7011 Alcoholic hepatitis with ascites: Secondary | ICD-10-CM

## 2018-12-10 DIAGNOSIS — K7031 Alcoholic cirrhosis of liver with ascites: Secondary | ICD-10-CM

## 2018-12-10 DIAGNOSIS — R410 Disorientation, unspecified: Secondary | ICD-10-CM

## 2018-12-14 ENCOUNTER — Telehealth: Payer: Self-pay | Admitting: Physician Assistant

## 2018-12-14 NOTE — Telephone Encounter (Signed)
The patient has been notified of this information and all questions answered.

## 2018-12-14 NOTE — Telephone Encounter (Signed)
Pt called stating that he was returning Patty's call from Friday. I see that pt last saw Amy so I am not sure of who to route this message.

## 2018-12-14 NOTE — Telephone Encounter (Signed)
Thank you Patty 

## 2018-12-23 ENCOUNTER — Encounter (HOSPITAL_COMMUNITY): Payer: Self-pay | Admitting: Physician Assistant

## 2018-12-23 ENCOUNTER — Ambulatory Visit (HOSPITAL_COMMUNITY)
Admission: RE | Admit: 2018-12-23 | Discharge: 2018-12-23 | Disposition: A | Discharge status: Home / Self Care | Payer: Medicaid Other | Source: Ambulatory Visit | Attending: Gastroenterology | Admitting: Gastroenterology

## 2018-12-23 DIAGNOSIS — K7011 Alcoholic hepatitis with ascites: Secondary | ICD-10-CM

## 2018-12-23 DIAGNOSIS — K7031 Alcoholic cirrhosis of liver with ascites: Secondary | ICD-10-CM | POA: Diagnosis not present

## 2018-12-23 HISTORY — PX: IR PARACENTESIS: IMG2679

## 2018-12-23 MED ORDER — LIDOCAINE HCL 1 % IJ SOLN
INTRAMUSCULAR | Status: DC | PRN
  Administered 2018-12-23: 10 mL

## 2018-12-23 MED ORDER — ALBUMIN HUMAN 25 % IV SOLN
INTRAVENOUS | Status: AC
  Filled 2018-12-23: qty 200

## 2018-12-23 MED ORDER — LIDOCAINE HCL 1 % IJ SOLN
INTRAMUSCULAR | Status: AC
  Filled 2018-12-23: qty 20

## 2018-12-23 MED ORDER — ALBUMIN HUMAN 25 % IV SOLN
50.0000 g | INTRAVENOUS | Status: DC
  Administered 2018-12-23: 50 g via INTRAVENOUS
  Filled 2018-12-23: qty 200

## 2018-12-23 NOTE — Procedures (Signed)
PROCEDURE SUMMARY:  Successful US guided paracentesis from left lateral abdomen.  Yielded 1 L of clear yellow fluid.  No immediate complications.  Patient tolerated well.  EBL = trace  Dallon Mcpeek S Anarosa Kubisiak PA-C 12/23/2018 1:29 PM

## 2018-12-28 ENCOUNTER — Ambulatory Visit
Admission: RE | Admit: 2018-12-28 | Discharge: 2018-12-28 | Disposition: A | Discharge status: Home / Self Care | Payer: Medicaid Other | Source: Ambulatory Visit | Attending: Interventional Radiology | Admitting: Interventional Radiology

## 2018-12-28 ENCOUNTER — Encounter: Payer: Self-pay | Admitting: Radiology

## 2018-12-28 DIAGNOSIS — Z95828 Presence of other vascular implants and grafts: Secondary | ICD-10-CM

## 2018-12-28 HISTORY — PX: IR RADIOLOGIST EVAL & MGMT: IMG5224

## 2018-12-28 NOTE — Progress Notes (Signed)
Chief Complaint: F/U after TIPS  Referring Physician(s): Watts,John  Supervising Physician: Simonne Come  History of Present Illness: Kenneth [name] is a 58 y.o. male with past medical history significant for polysubstance abuse, alcoholic cirrhosis complicated by portal hypertension, portal hypertension gastroscopy and esophageal varices.  He underwent a successful ultrasound and fluoroscopic guided creation of a TIPS on 05/19/2018 for recurrent symptomatic ascites.    The patient returns today to the interventional radiology clinic for follow up.  He initially recovered from the TIPS creation without incident and was discharged the subsequent day.  He did require 2 subsequent large-volume paracenteses with 6.2 L drained on 06/11/2018 and additional 6 L drained 06/29/2018.    He continues to require paracentesis, however the amount is improving.  Most recent paracentesis was on 12/23/2018 with only 1 L removed.  He resides in a [facility] and is unable to consume alcohol and it has allowed him to be more compliant with his medications.  He feels good. No nausea/vomiting. No Fever/chills. ROS negative.   Past Medical History:  Diagnosis Date  . Alcohol abuse   . Anxiety   . Arthritis    "never diagnosis" (09/02/2018)  . Ascites   . Blood clot in vein   . Cirrhosis (HCC)   . Depression   . Dyspnea    Due to ascites  . Esophageal varices (HCC) 09/02/2014  . H/O [facility] spotted fever    age 50 or 14  . Hernia, inguinal    right  . Hiatal hernia 09/02/2014  . Liver cirrhosis (HCC)   . Polysubstance abuse (HCC)   . Portal hypertension with esophageal varices (HCC) 09/02/2014  . Portal hypertensive gastropathy (HCC) 09/02/2014  . Thrombocytopenia (HCC) 09/02/2014  . Upper GI bleed 08/31/2014    Past Surgical History:  Procedure Laterality Date  . ESOPHAGOGASTRODUODENOSCOPY Left 09/01/2014   Procedure: ESOPHAGOGASTRODUODENOSCOPY (EGD);  Surgeon:  Charna Elizabeth, MD;  Location: [facility];  Service: Endoscopy;  Laterality: Left;  . EYE MUSCLE SURGERY Left 1967   "lazy eye"  . IR PARACENTESIS  03/16/2018  . IR PARACENTESIS  03/29/2018  . IR PARACENTESIS  04/19/2018  . IR PARACENTESIS  04/27/2018  . IR PARACENTESIS  05/10/2018  . IR PARACENTESIS  05/19/2018  . IR PARACENTESIS  06/11/2018  . IR PARACENTESIS  06/29/2018  . IR PARACENTESIS  07/16/2018  . IR PARACENTESIS  08/03/2018  . IR PARACENTESIS  08/23/2018  . IR PARACENTESIS  09/17/2018  . IR PARACENTESIS  10/19/2018  . IR PARACENTESIS  10/29/2018  . IR PARACENTESIS  11/05/2018  . IR PARACENTESIS  11/12/2018  . IR PARACENTESIS  11/19/2018  . IR PARACENTESIS  12/23/2018  . IR RADIOLOGIST EVAL & MGMT  05/13/2018  . IR RADIOLOGIST EVAL & MGMT  06/29/2018  . IR TIPS  05/19/2018  . RADIOLOGY WITH ANESTHESIA N/A 05/19/2018   Procedure: TIPS;  Surgeon: Simonne Come, MD;  Location: [facility] OR;  Service: Radiology;  Laterality: N/A;    Allergies: Latex  Medications: Prior to Admission medications   Medication Sig Start Date End Date Taking? Authorizing Provider  buPROPion (WELLBUTRIN SR) 100 MG 12 hr tablet Take 100 mg by mouth daily.    [provider]  diclofenac sodium (VOLTAREN) 1 % GEL Apply 4 g topically 4 (four) times daily. Patient taking differently: Apply 1 application topically 4 (four) times daily as needed (pain).  12/15/17   [name], MD  ergocalciferol (DRISDOL) 50000 units capsule Take 1 capsule (  50,000 Units total) by mouth once a week. Patient taking differently: Take 50,000 Units by mouth every Wednesday.  08/03/18   [name], MD  FLUoxetine (PROZAC) 20 MG tablet Take 20 mg by mouth daily.    [provider]  furosemide (LASIX) 40 MG tablet Take 1 tablet (40 mg total) by mouth daily. 09/04/18   Rai, Delene Ruffini, MD  gabapentin (NEURONTIN) 300 MG capsule Take 300 mg by mouth at bedtime.    [provider]  lactulose (CHRONULAC) 10 GM/15ML solution Take  60 mLs (40 g total) by mouth 3 (three) times daily. 12/10/18   Charlie Pitter III, MD  Lidocaine 4 % PTCH Apply 1 patch topically. Both legs twice a day as needed for pain    [provider]  midodrine (PROAMATINE) 5 MG tablet Take 0.5 tablets (2.5 mg total) by mouth 3 (three) times daily with meals. Patient taking differently: Take 2.5 mg by mouth 3 (three) times daily. 9a, 2p, 9p 09/04/18   Rai, Ripudeep K, MD  Morphine Sulfate (ROXANOL PO) Take 0.25 mLs by mouth every 6 (six) hours as needed.    [provider]  Multiple Vitamin (MULTIVITAMIN WITH MINERALS) TABS tablet Take 1 tablet by mouth daily. 08/18/18   Calvert Cantor, MD  nicotine (NICODERM CQ - DOSED IN MG/24 HOURS) 21 mg/24hr patch Place 21 mg onto the skin daily.    [provider]  ondansetron (ZOFRAN) 4 MG tablet Take 1 tablet (4 mg total) by mouth every 8 (eight) hours as needed for nausea or vomiting. 08/11/18   Sherrilyn Rist, MD  Potassium Chloride ER 20 MEQ TBCR Take 40 mEq by mouth daily. Patient taking differently: Take 20 mEq by mouth daily.  08/17/18   Calvert Cantor, MD  rifaximin (XIFAXAN) 550 MG TABS tablet Take 1 tablet (550 mg total) by mouth 2 (two) times daily. 09/04/18   Rai, Delene Ruffini, MD  spironolactone (ALDACTONE) 100 MG tablet Take 1 tablet (100 mg total) by mouth daily. Patient taking differently: Take 50 mg by mouth daily.  08/27/18   Rodolph Bong, MD     Family History  Problem Relation Age of Onset  . Leukemia Father        acute myloid   . Hyperlipidemia Father   . Hypertension Father   . Colon cancer Neg Hx   . Esophageal cancer Neg Hx   . Pancreatic cancer Neg Hx   . Stomach cancer Neg Hx   . Liver disease Neg Hx     Social History   Socioeconomic History  . Marital status: Divorced    Spouse name: Not on file  . Number of children: 1  . Years of education: Not on file  . Highest education level: Not on file  Occupational History  . Occupation: Administrator    Social Needs  . Financial resource strain: Not on file  . Food insecurity:    Worry: Not on file    Inability: Not on file  . Transportation needs:    Medical: Not on file    Non-medical: Not on file  Tobacco Use  . Smoking status: Current Every Day Smoker    Packs/day: 1.00    Years: 43.00    Pack years: 43.00    Types: Cigarettes  . Smokeless tobacco: Never Used  Substance and Sexual Activity  . Alcohol use: No    Comment: 09/02/2018 "no alcohol since 01/23/16"  . Drug use: Yes    Frequency:  5.0 times per week    Types: Marijuana    Comment: 09/02/2018 "nothing in the last month"  . Sexual activity: Not Currently  Lifestyle  . Physical activity:    Days per week: Not on file    Minutes per session: Not on file  . Stress: Not on file  Relationships  . Social connections:    Talks on phone: Not on file    Gets together: Not on file    Attends religious service: Not on file    Active member of club or organization: Not on file    Attends meetings of clubs or organizations: Not on file    Relationship status: Not on file  Other Topics Concern  . Not on file  Social History Narrative  . Not on file     Review of Systems: A 12 point ROS discussed and pertinent positives are indicated in the HPI above.  All other systems are negative.  Review of Systems  Vital Signs: BP 130/68   Pulse 76   Temp 98.2 F (36.8 C) (Oral)   Resp 13   Ht 5\' 7"  (1.702 m)   Wt 67.1 kg   SpO2 100%   BMI 23.18 kg/m   Physical Exam Vitals signs reviewed.  Constitutional:      Appearance: Normal appearance.  HENT:     Head: Normocephalic and atraumatic.  Eyes:     Extraocular Movements: Extraocular movements intact.  Cardiovascular:     Rate and Rhythm: Normal rate and regular rhythm.  Pulmonary:     Effort: Pulmonary effort is normal.     Breath sounds: Normal breath sounds.  Abdominal:     General: There is distension.     Palpations: Abdomen is soft.     Comments: Only  mild distension secondary to ascites.  Musculoskeletal:        General: Swelling present.  Skin:    General: Skin is warm and dry.  Neurological:     General: No focal deficit present.     Mental Status: He is alert and oriented to person, place, and time.  Psychiatric:        Mood and Affect: Mood normal.        Behavior: Behavior normal.        Thought Content: Thought content normal.        Judgment: Judgment normal.       Imaging: US Paracentesis  Result Date: 12/09/2018 INDICATION: Patient with history of history of cirrhosis s/p TIPS procedure and recurrent ascites. Request made for therapeutic paracentesis. Patient to receive albumin with procedure today. EXAM: ULTRASOUND GUIDED THERAPEUTIC PARACENTESIS MEDICATIONS: 10 mL 1% lidocaine COMPLICATIONS: None immediate. PROCEDURE: Informed written consent was obtained from the patient after a discussion of the risks, benefits and alternatives to treatment. A timeout was performed prior to the initiation of the procedure. Initial ultrasound scanning demonstrates a moderate amount of ascites within the left lower abdominal quadrant. The left lower abdomen was prepped and draped in the usual sterile fashion. 1% lidocaine was used for local anesthesia. Following this, a 6 Fr Safe-T-Centesis catheter was introduced. An ultrasound image was saved for documentation purposes. The paracentesis was performed. The catheter was removed and a dressing was applied. The patient tolerated the procedure well without immediate post procedural complication. FINDINGS: A total of approximately 2.6 L of hazy gold fluid was removed. IMPRESSION: Successful ultrasound-guided paracentesis yielding 2.6 L of peritoneal fluid. Read by: Elwin Mocha, PA-C Electronically Signed   By:  M.  Shick M.D.   On: 12/09/2018 15:44   US Abdominal Pelvic Art/vent Flow Doppler  Result Date: 12/28/2018 CLINICAL DATA:  History of alcoholic cirrhosis complicated by recurrent  symptomatic ascites, post TIPS creation on 05/19/2018, which was complicated by postprocedural transaminitis. Unfortunately, the patient has required persistent large volume paracenteses though the volume does appear to have tapered during his last 2 paras, yielding 1 L on 12/23/2018 and 2.6 L on 12/09/2018, previously, 4.9 L on the 11/19/2018 procedure. EXAM: DUPLEX ULTRASOUND OF LIVER AND TIPS SHUNT TECHNIQUE: Color and duplex Doppler ultrasound was performed to evaluate the hepatic in-flow and out-flow vessels. COMPARISON:  Ultrasound-guided paracentesis-12/23/2018; 12/09/2018; 11/19/2018; TIPS creation - 05/19/2028; abdominal MRI - 04/30/2018; CT abdomen and pelvis - 04/26/2018 FINDINGS: Redemonstrated shrunken markedly heterogeneous appearing liver without discrete nodule or mass. Note is made of a small amount of perihepatic ascites. The TIPS appears patent with normal/expected hepatic vascular directional flow. Portal Vein Velocities Main:  37 cm/sec Right:  115 cm/sec Left:  17 cm/sec TIPS Stent Velocities Proximal:  115 cm/sec Mid:  169 Distal:  155 cm/sec IVC: Present and patent with normal respiratory phasicity. Hepatic Vein Velocities Right:  155 cm/sec Mid:  35 cm/sec Left:  52 cm/sec Splenic Vein: 26 centimeters/second The spleen is enlarged measuring 11.9 cm in length. Superior Mesenteric Vein: Unable to visualized Hepatic Artery: 68 centimeters/second Ascites: Trace amount of intra-abdominal ascites Varices: None visualized IMPRESSION: 1. Widely patent TIPS without evidence of stenosis or dysfunction. 2. Trace amount of perihepatic ascites. 3. Shrunken heterogeneous appearing liver without discrete mass. Electronically Signed   By: Simonne Come M.D.   On: 12/28/2018 11:19   Ir Paracentesis  Result Date: 12/23/2018 INDICATION: Ascites secondary to cirrhosis. History of TIPS procedure. Request for therapeutic paracentesis. EXAM: ULTRASOUND GUIDED PARACENTESIS MEDICATIONS: 1% lidocaine 10 mL  COMPLICATIONS: None immediate. PROCEDURE: Informed written consent was obtained from the patient after a discussion of the risks, benefits and alternatives to treatment. A timeout was performed prior to the initiation of the procedure. Initial ultrasound scanning demonstrates a small amount of ascites within the left lower abdominal quadrant. The left lower abdomen was prepped and draped in the usual sterile fashion. 1% lidocaine with epinephrine was used for local anesthesia. Following this, a 19 gauge, 7-cm, Yueh catheter was introduced. An ultrasound image was saved for documentation purposes. The paracentesis was performed. The catheter was removed and a dressing was applied. The patient tolerated the procedure well without immediate post procedural complication. FINDINGS: A total of approximately 1 L of clear yellow fluid was removed. IMPRESSION: Successful ultrasound-guided paracentesis yielding 1 liter of peritoneal fluid. Read by: Corrin Parker, PA-C Electronically Signed   By: Simonne Come M.D.   On: 12/23/2018 13:29    Labs:  CBC: Recent Labs    08/26/18 0538 09/02/18 1040 10/02/18 2042 12/07/18 1543  WBC 6.3 3.6* 5.6 5.2  HGB 12.3* 11.9* 10.6* 11.8*  HCT 36.3* 37.1* 33.8* 34.4*  PLT 69* 62* 74* 82.0*    COAGS: Recent Labs    02/26/18 1605  03/26/18 1346  09/03/18 0411 10/02/18 2104 10/28/18 1204 12/07/18 1543  INR 1.42   < > 1.4*   < > 2.20 1.75 1.8* 1.7*  APTT 37*  --  35*  --   --  41*  --   --    < > = values in this interval not displayed.    BMP: Recent Labs    09/02/18 1040 09/03/18 0411 09/04/18 0538 10/02/18 2042 10/28/18  1204 12/07/18 1543  NA 139 141 135 130* 134* 137  K 3.0* 2.7* 3.5 3.6 4.4 5.9*  CL 104 108 105 97* 104 107  CO2 26 28 26 25 26 28   GLUCOSE 98 102* 89 85 109* 99  BUN 8 7 <5* <5* 8 7  CALCIUM 8.5* 9.1 8.6* 8.0* 8.2* 9.0  CREATININE 0.75 0.69 0.71 0.70 0.75 0.68  GFRNONAA >60 >60 >60 >60  --   --   GFRAA >60 >60 >60 >60  --   --      LIVER FUNCTION TESTS: Recent Labs    09/04/18 0538 10/02/18 2042 10/28/18 1204 12/07/18 1543  BILITOT 5.2* 3.5* 2.7* 2.9*  AST 78* 93* 77* 65*  ALT 59* 63* 51 41  ALKPHOS 132* 172* 153* 155*  PROT 5.4* 6.0* 5.4* 5.8*  ALBUMIN 2.0* 1.8* 2.0* 2.6*    TUMOR MARKERS: Recent Labs    04/16/18 1558 06/24/18 1225 10/28/18 1204  AFPTM 2.0 1.7 2.0    Assessment/Plan:  Recurrent ascites secondary to alcoholic cirrhosis.  S/P TIPS 05/19/2018.  TIPS doppler reviewed by Dr. Grace Isaac. It remains patent.  Re-accumulation of ascites continues to lessen.  MELD is 16 using lab data from 12/07/2018 which is unchanged from pre-TIPS MELD.  Return in 6 months with repeat doppler of TIPS.  Continue current medications.  Electronically Signed: Gwynneth Macleod PA-C 12/28/2018, 11:26 AM    Please refer to Dr. Kirt Boys attestation of this note for management and plan.

## 2019-01-14 ENCOUNTER — Telehealth: Payer: Self-pay

## 2019-01-14 NOTE — Telephone Encounter (Signed)
Covid-19 travel screening questions  Have you traveled in the last 14 days?No If yes where?  Do you now or have you had a fever in the last 14 days?No  Do you have any respiratory symptoms of shortness of breath or cough now or in the last 14 days?No  Do you have a medical history of Congestive Heart Failure?N/a  Do you have a medical history of lung disease?N/a  Do you have any family members or close contacts with diagnosed or suspected Covid-19?No  Pt plans to keep OV as scheduled.        

## 2019-01-16 ENCOUNTER — Telehealth: Payer: Self-pay

## 2019-01-16 NOTE — Telephone Encounter (Signed)
Spoke with pt and let him know Dr. Myrtie Neither will have a telephone visit with him tomorrow due to covid-19. Pt verbalized understanding.

## 2019-01-16 NOTE — Telephone Encounter (Signed)
SP

## 2019-01-17 ENCOUNTER — Other Ambulatory Visit: Payer: Self-pay

## 2019-01-17 ENCOUNTER — Telehealth (INDEPENDENT_AMBULATORY_CARE_PROVIDER_SITE_OTHER): Payer: Medicaid Other | Admitting: Gastroenterology

## 2019-01-17 DIAGNOSIS — K7031 Alcoholic cirrhosis of liver with ascites: Secondary | ICD-10-CM

## 2019-01-17 DIAGNOSIS — K7290 Hepatic failure, unspecified without coma: Secondary | ICD-10-CM

## 2019-01-17 DIAGNOSIS — K7682 Hepatic encephalopathy: Secondary | ICD-10-CM

## 2019-01-17 DIAGNOSIS — F17210 Nicotine dependence, cigarettes, uncomplicated: Secondary | ICD-10-CM | POA: Diagnosis not present

## 2019-01-17 NOTE — Progress Notes (Signed)
1:50 PM   no answer on the phone number listed for Mr. Hehn [phone].  I tried calling his son Aaron's number, no answer and no voicemail could be left because the mailbox is not set up.   ________________________________________________   4:40 PM, reached patient on his mobile number.  This patient contacted our office requesting a physician telephone consultation regarding clinical questions and/or test results.  The patient and I were the only participants on the phone call, and the patient consented to phone consultation.  I was in my office and the patient was in his room at his extended care facility  Chief complaint: Alcohol related cirrhosis    Relevant history and results:  This is follow-up for Mr. Veracruz's cirrhosis with complications. He was last seen in clinic February 11 main issues have been managing his volume overload and hepatic encephalopathy.  Overall, his volume control has been improved since he has been living at an ECF.  I think he is following sodium restriction and getting all his meds regularly.  His last paracentesis was on February 27, at which time only 1 L was removed.  Thus, we stopped regularly scheduled paracentesis.  It is somewhat difficult to follow [name]'s history, as he is tangential and has difficulty recalling events.  He vaguely recalled seeing the [facility] hepatology clinic on February 27, the note of which I reviewed.  He also had not recall that his son did not come to that appointment as instructed.  That was one instance demonstrating Sams social situation that makes him not a transplant candidate at present.  He is also an ongoing smoker, which means he cannot be listed.  [name] says that he is weighed at his facility "when I asked him to", and that his weight runs between 145 and 150 pounds.  He believes he has been taking all his medicines as prescribed.  Somewhat bothersome is that he was prescribed morphine by the physician at that  facility for sounds like neuropathy in his feet.  He then goes on to say that he does not get that pain all that often, but "I got a tell you, I just like to take the morphine".  This was addressed by previous providers in the sense that it would worsen his encephalopathy.   Sams appetite is good and he hopes to work his way toward home.  It sounds like his mother will be moved to a long-term care facility, so he would no longer have the burden of caring for her full-time, as he was doing before his current ECF placement.    Assessment and plan:   End-stage liver disease from alcohol abuse, ongoing tobacco use.  He is not currently a liver transplant candidate.  TIPS was placed last year with initial excellent control of volume overload, then recurrence of large volume ascites, which appeared largely due to dietary indiscretion and noncompliance with medical regimen. He has encephalopathy that sounds like it is under reasonably good control, with reported 2-3 soft BMs per day.  He still has difficulty with his memory, which I think is also a long-term effect of the encephalopathy.  Speech was slow but fluent.  No other change in his regimen at this point.  We will plan to see him in clinic in 4 to 6 weeks when current with 19 restrictions are hopefully lifted.  I will have our staff contact this facility to set up that appointment. I answered multiple questions related to his condition and treatment.  Total encounter time: 21.5 minutes   Amada Jupiter, MD

## 2019-01-17 NOTE — Patient Instructions (Signed)
Please follow up with our office in 4-6 weeks. We will be in touch with you to set that visit up. Should you not hear from [place] within the next several weeks, please call us at [phone].  It was a pleasure to speak with you today!  Dr. Myrtie Neither

## 2019-02-18 ENCOUNTER — Encounter: Payer: Self-pay | Admitting: Gastroenterology

## 2019-03-07 ENCOUNTER — Ambulatory Visit: Payer: Medicaid Other | Attending: Family Medicine | Admitting: Family Medicine

## 2019-03-07 DIAGNOSIS — K7682 Hepatic encephalopathy: Secondary | ICD-10-CM

## 2019-03-07 DIAGNOSIS — F325 Major depressive disorder, single episode, in full remission: Secondary | ICD-10-CM | POA: Diagnosis not present

## 2019-03-07 DIAGNOSIS — K7031 Alcoholic cirrhosis of liver with ascites: Secondary | ICD-10-CM

## 2019-03-07 DIAGNOSIS — G6289 Other specified polyneuropathies: Secondary | ICD-10-CM

## 2019-03-07 DIAGNOSIS — K7290 Hepatic failure, unspecified without coma: Secondary | ICD-10-CM | POA: Diagnosis not present

## 2019-03-07 NOTE — Progress Notes (Signed)
Virtual Visit via Telephone Note  I connected with Kenneth [name], on 03/07/2019 at 3:57 PM by telephone due to the COVID-19 pandemic and verified that I am speaking with the correct person using two identifiers.   Consent: I discussed the limitations, risks, security and privacy concerns of performing an evaluation and management service by telephone and the availability of in person appointments. I also discussed with the patient that there may be a patient responsible charge related to this service. The patient expressed understanding and agreed to proceed.   [facility]  Location of Provider: Clinic   Persons participating in Telemedicine visit: Kenneth [name]  Alicia Farrington-CMA Dr. Nelwyn SalisburyNewlin-PCP     History of Present Illness: Kenneth [name] is a 58 year old male with a history of alcoholic cirrhosis (status post TIPS in 04/2018) , thrombocytopenia, previous history of SMV thrombosis , depression for follow-up visit today. He has been compliant with his medications but complains of mastodynia with spironolactone.  This had been discontinued in the past due to complaints of gynecomastia and mastodynia with resolution of symptoms but he got placed on it at the extended care facility. He does have alcoholic peripheral neuropathy for which he is on gabapentin but currently receives morphine from the physician over at the Center and he informs me " I want to keep taking the morphine as it seems to help me level out, I am not so concerned about stuff and I have felt great after I started taking it." He endorses memory issues which sometimes are worse than other times. His weight ranges between 148 and 154 and he informs me at this time he has no pedal edema but does have some edema intermittently.  He has not required a paracentesis since 01/2018. His last visit with GI was a tele-visit on 01/17/2019 and as per notes he is not a transplant  candidate.   Past Medical History:  Diagnosis Date  . Alcohol abuse   . Anxiety   . Arthritis    "never diagnosis" (09/02/2018)  . Ascites   . Blood clot in vein   . Cirrhosis (HCC)   . Depression   . Dyspnea    Due to ascites  . Esophageal varices (HCC) 09/02/2014  . [facility]    age 58 or [age]  . Hernia, inguinal    right  . Hiatal hernia 09/02/2014  . Liver cirrhosis (HCC)   . Polysubstance abuse (HCC)   . Portal hypertension with esophageal varices (HCC) 09/02/2014  . Portal hypertensive gastropathy (HCC) 09/02/2014  . Thrombocytopenia (HCC) 09/02/2014  . Upper GI bleed 08/31/2014   Allergies  Allergen Reactions  . Latex Swelling and Rash    Current Outpatient Medications on File Prior to Visit  Medication Sig Dispense Refill  . buPROPion (WELLBUTRIN SR) 100 MG 12 hr tablet Take 100 mg by mouth daily.    . diclofenac sodium (VOLTAREN) 1 % GEL Apply 4 g topically 4 (four) times daily. (Patient taking differently: Apply 1 application topically 4 (four) times daily as needed (pain). ) 100 g 0  . ergocalciferol (DRISDOL) 50000 units capsule Take 1 capsule (50,000 Units total) by mouth once a week. (Patient taking differently: Take 50,000 Units by mouth every Wednesday. ) 9 capsule 1  . FLUoxetine (PROZAC) 20 MG tablet Take 20 mg by mouth daily.    . furosemide (LASIX) 40 MG tablet Take 1 tablet (40 mg total) by mouth daily.  30 tablet 1  . gabapentin (NEURONTIN) 300 MG capsule Take 300 mg by mouth at bedtime.    Marland Kitchen lactulose (CHRONULAC) 10 GM/15ML solution Take 60 mLs (40 g total) by mouth 3 (three) times daily. 5400 mL 0  . Lidocaine 4 % PTCH Apply 1 patch topically. Both legs twice a day as needed for pain    . midodrine (PROAMATINE) 5 MG tablet Take 0.5 tablets (2.5 mg total) by mouth 3 (three) times daily with meals. (Patient taking differently: Take 2.5 mg by mouth 3 (three) times daily. 9a, 2p, 9p) 90 tablet 1  . Morphine Sulfate (ROXANOL PO) Take 0.25  mLs by mouth every 6 (six) hours as needed.    . Multiple Vitamin (MULTIVITAMIN WITH MINERALS) TABS tablet Take 1 tablet by mouth daily. 30 tablet 0  . nicotine (NICODERM CQ - DOSED IN MG/24 HOURS) 21 mg/24hr patch Place 21 mg onto the skin daily.    . ondansetron (ZOFRAN) 4 MG tablet Take 1 tablet (4 mg total) by mouth every 8 (eight) hours as needed for nausea or vomiting. 60 tablet 3  . Potassium Chloride ER 20 MEQ TBCR Take 40 mEq by mouth daily. (Patient taking differently: Take 20 mEq by mouth daily. ) 30 tablet 0  . rifaximin (XIFAXAN) 550 MG TABS tablet Take 1 tablet (550 mg total) by mouth 2 (two) times daily. 60 tablet 1  . spironolactone (ALDACTONE) 100 MG tablet Take 1 tablet (100 mg total) by mouth daily. (Patient taking differently: Take 50 mg by mouth daily. ) 30 tablet 0   Current Facility-Administered Medications on File Prior to Visit  Medication Dose Route Frequency Provider Last Rate Last Dose  . albumin human 25 % solution 50 g  50 g Intravenous Once Charlie Pitter III, MD      . albumin human 25 % solution 50 g  50 g Intravenous Once Charlie Pitter III, MD      . albumin human 25 % solution 50 g  50 g Intravenous Once Charlie Pitter III, MD      . albumin human 25 % solution 50 g  50 g Intravenous Once Sherrilyn Rist, MD        Observations/Objective: Awake, alert, oriented x3  Not in acute distress  CMP Latest Ref Rng & Units 12/07/2018 10/28/2018 10/02/2018  Glucose 70 - 99 mg/dL 99 161(W) 85  BUN 6 - 23 mg/dL 7 8 <9(U)  Creatinine 0.45 - 1.50 mg/dL 4.09 8.11 9.14  Sodium 135 - 145 mEq/L 137 134(L) 130(L)  Potassium 3.5 - 5.1 mEq/L 5.9(H) 4.4 3.6  Chloride 96 - 112 mEq/L 107 104 97(L)  CO2 19 - 32 mEq/L Calcium 8.4 - 10.5 mg/dL 9.0 7.8(G) 9.5(A)  Total Protein 6.0 - 8.3 g/dL 2.1(H) 0.8(M) 6.0(L)  Total Bilirubin 0.2 - 1.2 mg/dL 2.9(H) 2.7(H) 3.5(H)  Alkaline Phos 39 - 117 U/L 155(H) 153(H) 172(H)  AST 0 - 37 U/L 65(H) 77(H) 93(H)  ALT 0 - 53 U/L 41  51 63(H)        Assessment and Plan: 1. Alcoholic cirrhosis of liver with ascites (HCC) Status post TIPS Not a candidate for liver transplant as per GI notes Continue Lasix; he complains of mastodynia with spironolactone He continues to abstain from alcohol  2. Other polyneuropathy I had prescribed gabapentin for him He currently receives morphine from the doctor at the extended care facility I have discussed with him it is my medical  opinion that he should not be on morphine especially given his encephalopathy. He wishes to transition care to me once he gets out of there and I have informed him he will need to be weaned off it because I will not be prescribing morphine  3. Major depressive disorder with single episode, in full remission (HCC) Controlled on Prozac  4. Hepatic encephalopathy (HCC) Stable at this time His memory fluctuates as per reports from his son he says Continue lactulose   Follow Up Instructions: Return in about 3 months (around 06/07/2019).    I discussed the assessment and treatment plan with the patient. The patient was provided an opportunity to ask questions and all were answered. The patient agreed with the plan and demonstrated an understanding of the instructions.   The patient was advised to call back or seek an in-person evaluation if the symptoms worsen or if the condition fails to improve as anticipated.     I provided 15 minutes total of non-face-to-face time during this encounter including median intraservice time, reviewing previous notes, labs, imaging, medications and explaining diagnosis and management.     [name], MD, FAAFP. [facility] [place], [place] [phone]   03/07/2019, 3:57 PM

## 2019-03-08 ENCOUNTER — Encounter: Payer: Self-pay | Admitting: Family Medicine

## 2019-03-16 ENCOUNTER — Telehealth: Payer: Self-pay

## 2019-03-16 NOTE — Telephone Encounter (Signed)
As per Kenneth [name], [employer], the patient's referral is now closed.  

## 2019-03-23 ENCOUNTER — Ambulatory Visit: Payer: Medicaid Other | Attending: Family Medicine | Admitting: Family Medicine

## 2019-03-23 ENCOUNTER — Encounter: Payer: Self-pay | Admitting: Family Medicine

## 2019-03-23 ENCOUNTER — Other Ambulatory Visit: Payer: Self-pay

## 2019-03-23 DIAGNOSIS — F325 Major depressive disorder, single episode, in full remission: Secondary | ICD-10-CM

## 2019-03-23 DIAGNOSIS — R410 Disorientation, unspecified: Secondary | ICD-10-CM

## 2019-03-23 DIAGNOSIS — K7031 Alcoholic cirrhosis of liver with ascites: Secondary | ICD-10-CM

## 2019-03-23 DIAGNOSIS — E559 Vitamin D deficiency, unspecified: Secondary | ICD-10-CM

## 2019-03-23 DIAGNOSIS — F1193 Opioid use, unspecified with withdrawal: Secondary | ICD-10-CM

## 2019-03-23 DIAGNOSIS — F1123 Opioid dependence with withdrawal: Secondary | ICD-10-CM

## 2019-03-23 DIAGNOSIS — G6289 Other specified polyneuropathies: Secondary | ICD-10-CM

## 2019-03-23 MED ORDER — GABAPENTIN 300 MG PO CAPS: 300.0000 mg | ORAL_CAPSULE | Freq: Every day | ORAL | 3 refills | Status: DC

## 2019-03-23 MED ORDER — POTASSIUM CHLORIDE ER 20 MEQ PO TBCR: 40.0000 meq | EXTENDED_RELEASE_TABLET | Freq: Every day | ORAL | 3 refills | Status: DC

## 2019-03-23 MED ORDER — DIPHENOXYLATE-ATROPINE 2.5-0.025 MG PO TABS: 1.0000 | ORAL_TABLET | Freq: Four times a day (QID) | ORAL | 0 refills | Status: DC | PRN

## 2019-03-23 MED ORDER — LACTULOSE 10 GM/15ML PO SOLN: 30.0000 g | Freq: Three times a day (TID) | ORAL | 3 refills | Status: DC

## 2019-03-23 MED ORDER — TRAMADOL HCL 50 MG PO TABS: 50.0000 mg | ORAL_TABLET | Freq: Two times a day (BID) | ORAL | 0 refills | Status: DC | PRN

## 2019-03-23 MED ORDER — FUROSEMIDE 40 MG PO TABS: 40.0000 mg | ORAL_TABLET | Freq: Every day | ORAL | 3 refills | Status: DC

## 2019-03-23 MED ORDER — ONDANSETRON HCL 4 MG PO TABS: 4.0000 mg | ORAL_TABLET | Freq: Three times a day (TID) | ORAL | 3 refills | Status: DC | PRN

## 2019-03-23 MED ORDER — FLUOXETINE HCL 20 MG PO TABS: 20.0000 mg | ORAL_TABLET | Freq: Every day | ORAL | 3 refills | Status: DC

## 2019-03-23 MED FILL — FLUoxetine HCL 20 MG CAPS: 20 | 30 days supply | Qty: 30 | Fill #0

## 2019-03-23 MED FILL — ONDANSETRON HCL 4 MG TABLET: 4 | 20 days supply | Qty: 60 | Fill #0

## 2019-03-23 MED FILL — GABAPENTIN 300 MG CAPSULE: 300 | 30 days supply | Qty: 30 | Fill #0

## 2019-03-23 MED FILL — LACTULOSE 10 GM/15 ML SOLN: 10 | 14 days supply | Qty: 1892 | Fill #0

## 2019-03-23 MED FILL — traMADol HCL 50 MG TABS: 50 | 7 days supply | Qty: 14 | Fill #0

## 2019-03-23 MED FILL — POTASSIUM CL ER 20 MEQ TAB: 20 | 15 days supply | Qty: 30 | Fill #0

## 2019-03-23 NOTE — Progress Notes (Signed)
Virtual Visit via Telephone Note  I connected with Kenneth [name], on 03/23/2019 at 1:49 PM by telephone due to the COVID-19 pandemic and verified that I am speaking with the correct person using two identifiers.   Consent: I discussed the limitations, risks, security and privacy concerns of performing an evaluation and management service by telephone and the availability of in person appointments. I also discussed with the patient that there may be a patient responsible charge related to this service. The patient expressed understanding and agreed to proceed.   Location of Patient: Patient's home  Location of Provider: Clinic   Persons participating in Telemedicine visit: Verna Hamon Farrington-CMA Dr. Felecia Shelling     History of Present Illness: Kenneth [name] is a 58 year old male with a history of alcoholic cirrhosis (status post TIPS in 04/2018) , thrombocytopenia, previous history of SMV thrombosis , depression seen for follow-up visit 2 weeks ago and recently got discharged from [facility] 3 days ago; he is needing refill of all his medications.  He was discharged without a prescription for morphine and we had had a conversation at his last visit 2 weeks ago as I informed him there was no indication for him to remain on morphine especially in the setting of his encephalopathy from liver cirrhosis but he was adamant about it as it helped with his neuropathy and it was prescribed by the physician at [facility].  His gastroenterologist-Dr. Simona Huh was also not in support of his using morphine. He is beginning to have increased diarrhea and thinks he may be withdrawing from morphine.  He does have problem with his memory and he needs assistance of his son to assist with the medications needing refills.  He also is unable to recall some of the conversations we had at his last visit. He is on spironolactone for cirrhosis and complains of mastodynia and  gynecomastia.  This was discontinued last year due to similar complaints but he got placed back on it.   Past Medical History:  Diagnosis Date  . Alcohol abuse   . Anxiety   . Arthritis    "never diagnosis" (09/02/2018)  . Ascites   . Blood clot in vein   . Cirrhosis ([employer])   . Depression   . Dyspnea    Due to ascites  . Esophageal varices ([place]) 09/02/2014  . H/O [facility] spotted fever    age 74 or 28  . Hernia, inguinal    right  . Hiatal hernia 09/02/2014  . Liver cirrhosis ([place])   . Polysubstance abuse ([place])   . Portal hypertension with esophageal varices (HCC) 09/02/2014  . Portal hypertensive gastropathy ([place]) 09/02/2014  . Thrombocytopenia ([place]) 09/02/2014  . Upper GI bleed 08/31/2014   Allergies  Allergen Reactions  . Latex Swelling and Rash    Current Outpatient Medications on File Prior to Visit  Medication Sig Dispense Refill  . buPROPion (WELLBUTRIN SR) 100 MG 12 hr tablet Take 100 mg by mouth daily.    . diclofenac sodium (VOLTAREN) 1 % GEL Apply 4 g topically 4 (four) times daily. 100 g 0  . ergocalciferol (DRISDOL) 50000 units capsule Take 1 capsule (50,000 Units total) by mouth once a week. (Patient taking differently: Take 50,000 Units by mouth every Wednesday. ) 9 capsule 1  . FLUoxetine (PROZAC) 20 MG tablet Take 20 mg by mouth daily.    . furosemide (LASIX) 40 MG tablet Take 1 tablet (40 mg total) by mouth daily.  30 tablet 1  . gabapentin (NEURONTIN) 300 MG capsule Take 300 mg by mouth at bedtime.    Marland Kitchen lactulose (CHRONULAC) 10 GM/15ML solution Take 60 mLs (40 g total) by mouth 3 (three) times daily. 5400 mL 0  . midodrine (PROAMATINE) 5 MG tablet Take 0.5 tablets (2.5 mg total) by mouth 3 (three) times daily with meals. (Patient taking differently: Take 2.5 mg by mouth 3 (three) times daily. 9a, 2p, 9p) 90 tablet 1  . Morphine Sulfate (ROXANOL PO) Take 0.25 mLs by mouth every 6 (six) hours as needed.    . Multiple Vitamin (MULTIVITAMIN WITH MINERALS) TABS  tablet Take 1 tablet by mouth daily. 30 tablet 0  . nicotine (NICODERM CQ - DOSED IN MG/24 HOURS) 21 mg/24hr patch Place 21 mg onto the skin daily.    . ondansetron (ZOFRAN) 4 MG tablet Take 1 tablet (4 mg total) by mouth every 8 (eight) hours as needed for nausea or vomiting. 60 tablet 3  . Potassium Chloride ER 20 MEQ TBCR Take 40 mEq by mouth daily. 30 tablet 0  . rifaximin (XIFAXAN) 550 MG TABS tablet Take 1 tablet (550 mg total) by mouth 2 (two) times daily. 60 tablet 1  . spironolactone (ALDACTONE) 100 MG tablet Take 1 tablet (100 mg total) by mouth daily. (Patient taking differently: Take 50 mg by mouth daily. ) 30 tablet 0  . Lidocaine 4 % PTCH Apply 1 patch topically. Both legs twice a day as needed for pain     Current Facility-Administered Medications on File Prior to Visit  Medication Dose Route Frequency Provider Last Rate Last Dose  . albumin human 25 % solution 50 g  50 g Intravenous Once Nelida Meuse III, MD      . albumin human 25 % solution 50 g  50 g Intravenous Once Nelida Meuse III, MD      . albumin human 25 % solution 50 g  50 g Intravenous Once Nelida Meuse III, MD      . albumin human 25 % solution 50 g  50 g Intravenous Once Doran Stabler, MD        Observations/Objective: Awake, alert, oriented x3 Not in acute distress  Assessment and Plan: 1. Alcoholic cirrhosis of liver with ascites ([place]) Advised to schedule upcoming appointment with GI as plan was for 4 to 6 weeks follow-up from his last visit with GI in 12/2018 - ondansetron (ZOFRAN) 4 MG tablet; Take 1 tablet (4 mg total) by mouth every 8 (eight) hours as needed for nausea or vomiting.  Dispense: 60 tablet; Refill: 3 - furosemide (LASIX) 40 MG tablet; Take 1 tablet (40 mg total) by mouth daily.  Dispense: 30 tablet; Refill: 3 - CMP14+EGFR; Future - CBC with Differential/Platelet - Protime-INR  2. Confusion He does have some memory problems - lactulose (CHRONULAC) 10 GM/15ML solution; Take 45  mLs (30 g total) by mouth 3 (three) times daily.  Dispense: 1892 mL; Refill: 3 - Ammonia; Future  3. Vitamin D deficiency Previously on Drisdol while in inpatient rehab - VITAMIN D 25 Hydroxy (Vit-D Deficiency, Fractures); Future  4. Other polyneuropathy He was receiving morphine at [facility] and had resisted discontinuing this during his stay there I had previously informed him at his last visit and again today that there is no indication for morphine.  Will consider increasing gabapentin dose if uncontrolled - gabapentin (NEURONTIN) 300 MG capsule; Take 1 capsule (300 mg total) by mouth at bedtime.  Dispense: 30 capsule; Refill: 3  5. Major depressive disorder with single episode, in full remission (HCC) Stable - FLUoxetine (PROZAC) 20 MG tablet; Take 1 tablet (20 mg total) by mouth daily.  Dispense: 30 tablet; Refill: 3  6. Opioid withdrawal [facility]) Discharged from [facility] without morphine despite taking it during his admission Beginning to experience early withdrawal symptoms Placed on the borderline tramadol and I have discussed symptoms to expect-present to the ED if symptoms continue to worsen - diphenoxylate-atropine (LOMOTIL) 2.5-0.025 MG tablet; Take 1 tablet by mouth every 6 (six) hours as needed for diarrhea or loose stools. For withdrawal symptoms  Dispense: 30 tablet; Refill: 0 - traMADol (ULTRAM) 50 MG tablet; Take 1 tablet (50 mg total) by mouth every 12 (twelve) hours as needed for up to 14 days. For Morphine withdrawal  Dispense: 30 tablet; Refill: 0   Follow Up Instructions: Keep previously scheduled appointment   I discussed the assessment and treatment plan with the patient. The patient was provided an opportunity to ask questions and all were answered. The patient agreed with the plan and demonstrated an understanding of the instructions.   The patient was advised to call back or seek an in-person evaluation if the symptoms worsen or if the condition fails to  improve as anticipated.     I provided 24 minutes total of non-face-to-face time during this encounter including median intraservice time, reviewing previous notes, labs, imaging, medications, management and patient verbalized understanding.     Charlott Rakes, MD, FAAFP. [facility] [place], [place]   03/23/2019, 1:49 PM

## 2019-03-23 NOTE — Progress Notes (Signed)
Patient has been called and DOB has been verified. Patient has been screened and transferred to PCP to start phone visit.   REFILLS: Morphine sulfate, gabapentin,nicotien patches, Zofran, potassium  Dr. Bonnell Public facility doctor)

## 2019-03-25 ENCOUNTER — Ambulatory Visit: Payer: Medicaid Other | Attending: Family Medicine

## 2019-03-25 ENCOUNTER — Other Ambulatory Visit: Payer: Self-pay

## 2019-03-25 DIAGNOSIS — K7031 Alcoholic cirrhosis of liver with ascites: Secondary | ICD-10-CM

## 2019-03-25 DIAGNOSIS — R410 Disorientation, unspecified: Secondary | ICD-10-CM

## 2019-03-25 DIAGNOSIS — E559 Vitamin D deficiency, unspecified: Secondary | ICD-10-CM

## 2019-03-26 LAB — CMP14+EGFR
ALT: 104 IU/L — ABNORMAL HIGH (ref 0–44)
AST: 162 IU/L — ABNORMAL HIGH (ref 0–40)
Albumin/Globulin Ratio: 0.8 — ABNORMAL LOW (ref 1.2–2.2)
Albumin: 2.8 g/dL — ABNORMAL LOW (ref 3.8–4.9)
Alkaline Phosphatase: 183 IU/L — ABNORMAL HIGH (ref 39–117)
BUN/Creatinine Ratio: 6 — ABNORMAL LOW (ref 9–20)
BUN: 5 mg/dL — ABNORMAL LOW (ref 6–24)
Bilirubin Total: 3.1 mg/dL — ABNORMAL HIGH (ref 0.0–1.2)
CO2: 22 mmol/L (ref 20–29)
Calcium: 8.6 mg/dL — ABNORMAL LOW (ref 8.7–10.2)
Chloride: 104 mmol/L (ref 96–106)
Creatinine, Ser: 0.8 mg/dL (ref 0.76–1.27)
GFR calc Af Amer: 115 mL/min/{1.73_m2} (ref >59)
GFR calc non Af Amer: 99 mL/min/{1.73_m2} (ref >59)
Globulin, Total: 3.6 g/dL (ref 1.5–4.5)
Glucose: 88 mg/dL (ref 65–99)
Potassium: 3.9 mmol/L (ref 3.5–5.2)
Sodium: 136 mmol/L (ref 134–144)
Total Protein: 6.4 g/dL (ref 6.0–8.5)

## 2019-03-26 LAB — CBC WITH DIFFERENTIAL/PLATELET
Basophils Absolute: 0.1 10*3/uL (ref 0.0–0.2)
Basos: 1 %
EOS (ABSOLUTE): 0.3 10*3/uL (ref 0.0–0.4)
Eos: 4 %
Hematocrit: 36.2 % — ABNORMAL LOW (ref 37.5–51.0)
Hemoglobin: 13.3 g/dL (ref 13.0–17.7)
Immature Grans (Abs): 0 10*3/uL (ref 0.0–0.1)
Immature Granulocytes: 1 %
Lymphocytes Absolute: 1.1 10*3/uL (ref 0.7–3.1)
Lymphs: 15 %
MCH: 32.6 pg (ref 26.6–33.0)
MCHC: 36.7 g/dL — ABNORMAL HIGH (ref 31.5–35.7)
MCV: 89 fL (ref 79–97)
Monocytes Absolute: 1.4 10*3/uL — ABNORMAL HIGH (ref 0.1–0.9)
Monocytes: 18 %
Neutrophils Absolute: 4.7 10*3/uL (ref 1.4–7.0)
Neutrophils: 61 %
Platelets: 80 10*3/uL — CL (ref 150–450)
RBC: 4.08 x10E6/uL — ABNORMAL LOW (ref 4.14–5.80)
RDW: 13.6 % (ref 11.6–15.4)
WBC: 7.5 10*3/uL (ref 3.4–10.8)

## 2019-03-26 LAB — PROTIME-INR
INR: 1.4 — ABNORMAL HIGH (ref 0.8–1.2)
Prothrombin Time: 14.8 s — ABNORMAL HIGH (ref 9.1–12.0)

## 2019-03-26 LAB — AMMONIA: Ammonia: 106 ug/dL (ref 40–200)

## 2019-03-26 LAB — VITAMIN D 25 HYDROXY (VIT D DEFICIENCY, FRACTURES): Vit D, 25-Hydroxy: 41.4 ng/mL (ref 30.0–100.0)

## 2019-03-29 ENCOUNTER — Other Ambulatory Visit: Payer: Self-pay | Admitting: Family Medicine

## 2019-03-29 ENCOUNTER — Encounter: Payer: Self-pay | Admitting: Family Medicine

## 2019-03-29 DIAGNOSIS — F1123 Opioid dependence with withdrawal: Secondary | ICD-10-CM

## 2019-03-29 DIAGNOSIS — F1193 Opioid use, unspecified with withdrawal: Secondary | ICD-10-CM

## 2019-03-29 MED ORDER — TRAMADOL HCL 50 MG PO TABS: 50.0000 mg | ORAL_TABLET | Freq: Two times a day (BID) | ORAL | 0 refills | Status: AC | PRN

## 2019-03-30 ENCOUNTER — Encounter: Payer: Self-pay | Admitting: Family Medicine

## 2019-03-30 DIAGNOSIS — K7031 Alcoholic cirrhosis of liver with ascites: Secondary | ICD-10-CM

## 2019-03-31 MED ORDER — FUROSEMIDE 40 MG PO TABS: 40.0000 mg | ORAL_TABLET | Freq: Every day | ORAL | 3 refills | Status: DC

## 2019-03-31 MED ORDER — BUPROPION HCL ER (SR) 100 MG PO TB12: 100.0000 mg | ORAL_TABLET | Freq: Every day | ORAL | 3 refills | Status: DC

## 2019-04-05 ENCOUNTER — Encounter: Payer: Self-pay | Admitting: Gastroenterology

## 2019-04-05 ENCOUNTER — Other Ambulatory Visit: Payer: Self-pay

## 2019-04-05 ENCOUNTER — Ambulatory Visit (INDEPENDENT_AMBULATORY_CARE_PROVIDER_SITE_OTHER): Payer: Medicaid Other | Admitting: Gastroenterology

## 2019-04-05 VITALS — Ht 67.0 in | Wt 160.0 lb

## 2019-04-05 DIAGNOSIS — K7031 Alcoholic cirrhosis of liver with ascites: Secondary | ICD-10-CM

## 2019-04-05 NOTE — Progress Notes (Signed)
This patient contacted our office requesting a physician telemedicine consultation regarding clinical questions and/or test results.  Our office reached [name] earlier this afternoon because I was unable to keep our 1:30 PM appointment as scheduled.  I called his number of [phone] at [phone] this evening but was unable to reach him.  I will have my office contact him to set up a telemedicine appointment with me soon.   _____________________________________________________________________________________________

## 2019-04-07 ENCOUNTER — Other Ambulatory Visit: Payer: Self-pay

## 2019-04-07 ENCOUNTER — Encounter: Payer: Self-pay | Admitting: Gastroenterology

## 2019-04-07 ENCOUNTER — Ambulatory Visit (INDEPENDENT_AMBULATORY_CARE_PROVIDER_SITE_OTHER): Payer: Medicaid Other | Admitting: Gastroenterology

## 2019-04-07 DIAGNOSIS — K7682 Hepatic encephalopathy: Secondary | ICD-10-CM

## 2019-04-07 DIAGNOSIS — K7031 Alcoholic cirrhosis of liver with ascites: Secondary | ICD-10-CM | POA: Diagnosis not present

## 2019-04-07 DIAGNOSIS — I8500 Esophageal varices without bleeding: Secondary | ICD-10-CM

## 2019-04-07 DIAGNOSIS — K7290 Hepatic failure, unspecified without coma: Secondary | ICD-10-CM

## 2019-04-07 DIAGNOSIS — D696 Thrombocytopenia, unspecified: Secondary | ICD-10-CM

## 2019-04-07 DIAGNOSIS — N62 Hypertrophy of breast: Secondary | ICD-10-CM | POA: Diagnosis not present

## 2019-04-07 DIAGNOSIS — Z720 Tobacco use: Secondary | ICD-10-CM

## 2019-04-07 DIAGNOSIS — I8510 Secondary esophageal varices without bleeding: Secondary | ICD-10-CM

## 2019-04-07 MED ORDER — AMILORIDE HCL 5 MG PO TABS: 10.0000 mg | ORAL_TABLET | Freq: Every day | ORAL | 6 refills | Status: DC

## 2019-04-07 MED ORDER — RIFAXIMIN 550 MG PO TABS: 550.0000 mg | ORAL_TABLET | Freq: Two times a day (BID) | ORAL | 6 refills | Status: DC

## 2019-04-07 NOTE — Progress Notes (Addendum)
This patient contacted our office requesting a physician telemedicine consultation regarding clinical questions and/or test results.  Participants on the conference : myself and patient and his son  The patient consented to this consultation and was aware that a charge will be placed through their insurance.  I was in my office and the patient was at home (his son Kenneth [name]'s house, where he now lives)   Encounter time:  Total time 56 minutes, with 29 minutes spent with patient on D[employer]imity (Extensive chart review, patient/family counseling and ordering with documentation required on this complex patient)  _____________________________________________________________________________________________               Kenneth [name] GI Progress Note  Chief Complaint: Alcoholic cirrhosis of liver with ascites  Subjective  History: Kenneth [name] was last seen on 10/28/2018 with decompensated cirrhosis from former alcohol abuse.  He has large volume ascites requiring large-volume paracentesis despite prior TIPS that is believed to be functioning.  Last year he was admitted for hepatic encephalopathy, then improved with the addition of rifaximin to his previous lactulose regimen.  He has hypotension and has been maintained on midodrine in an attempt to maintain a good enough blood pressure to stay on low-dose diuretics and also decrease the chance of renal decompensation with low blood pressure has thus far precluded advancement of diuretic doses beyond spironolactone 100 mg daily and furosemide 40 mg daily.  When I last spoke with him, he was in an extended care facility, which seems to have been good for him.  He was taking his medications regularly and able to go for paracentesis.  My assessment and plan from my last visit is as follows:  "End-stage liver disease, he has had a long period of abstinence from alcohol.  His condition decompensated earlier this year for unclear reasons, ascites was initially much  improved after TIPS placement but then recurred as bad as it was prior to TIPS placement.  His ongoing hypotension despite midodrine therapy precludes increase of diuretic doses.  He remains at high risk for renal decompensation as evidenced by chronic low sodium level.   Esophageal varices were banded by [name] November 2015, unknown if they are still present after TIPS placement.  However, even if present now, I believe his hypotension and renal volume sensitivity would preclude beta-blocker therapy.  Therefore, I have not scheduled a repeat upper endoscopy.     He appears to have received a flu shot during recent hospitalization.   Plan: We have arranged weekly paracentesis for the next 4 weeks, where up to 5 L can be removed along with administration of 50 g of 25% IV albumin.  No studies are needed on the fluid. He will have this paracentesis done weekly for the next 4 weeks, then we will see him in clinic follow-up and make a further plan regarding the optimal interval for paracentesis at that point.   Maintain current doses of other medicines.   I will make an inquiry to the [facility] transplant hepatology clinic to see if they are agreeable to evaluating him.  He did not show for 2 prior visits with them earlier this year.   CMP, INR 4 up-to-date meld score, and AFP for hepatoma screening.  His last AFP was 5 months ago.   I instructed his care facility to continue 2000 mg daily sodium restriction, but to lift his daily fluid restriction.  It is not necessary unless his serum sodium is consistently 126 or below."  ____________________________________________________  Kenneth [name] underwent  therapeutic paracentesis on 12/09/2018, and was to have it done weekly for several weeks plans for clinic follow-up.    We had a telephone visit on March 23, but it was difficult to get a clear consistent history of his symptoms and what medicines he was taking. I was able to determine that he went for  transplant hepatology clinic visit on February 27, but unfortunately his son did not accompany him as instructed.  Given previous no-shows with that clinic, it was determined he was not a liver transplant candidate.  He was also an ongoing smoker, which meant he could not be listed.  Of note, he was also being prescribed morphine regularly by the physician at that facility for what sounded like peripheral neuropathy. Unfortunately he was lost to follow-up after being discharged from his extended care facility in the midst of COVID.  He contacted us recently complaining of abdominal distention, feeling that he needed a paracentesis performed.  Telemedicine visit was set up today to reestablish care.  Primary care telemedicine note from 03/23/2019.  He apparently complained of gynecomastia which was felt to be due to spironolactone.  [name] also felt he should no longer be on morphine, and started Neurontin instead.  She also felt he was going through some opioid withdrawal with complaints of diarrhea, and prescribed Lomotil and tramadol.  Kenneth [name] is bothered by painful lateral gynecomastia, abdominal distention from ascites and is requesting paracentesis.  His memory still gives him difficulty, and he had some trouble keeping track of his various medicines and their indications.  His son Kenneth [name] was present for the entire encounter, he has been reportedly taking care of his father including obtaining and organizing his medicines and keeping track of physician visits.  He has not had vomiting dysphagia black or bloody stool.  ROS: Cardiovascular:  no chest pain Respiratory: Dyspnea when laying flat to the abdominal distention.  The patient's Past Medical, Family and Social History were reviewed and are on file in the EMR.  chronic foot pain Depression stable Remainder of systems negative except as above Objective:  Med list reviewed  Current Outpatient Medications:  .  aMILoride (MIDAMOR) 5 MG  tablet, Take 2 tablets (10 mg total) by mouth daily., Disp: 30 tablet, Rfl: 6 .  buPROPion (WELLBUTRIN SR) 100 MG 12 hr tablet, Take 1 tablet (100 mg total) by mouth daily., Disp: 60 tablet, Rfl: 3 .  diclofenac sodium (VOLTAREN) 1 % GEL, Apply 4 g topically 4 (four) times daily., Disp: 100 g, Rfl: 0 .  ergocalciferol (DRISDOL) 50000 units capsule, Take 1 capsule (50,000 Units total) by mouth once a week. (Patient taking differently: Take 50,000 Units by mouth every Wednesday. ), Disp: 9 capsule, Rfl: 1 .  FLUoxetine (PROZAC) 20 MG tablet, Take 1 tablet (20 mg total) by mouth daily., Disp: 30 tablet, Rfl: 3 .  furosemide (LASIX) 40 MG tablet, Take 1 tablet (40 mg total) by mouth daily., Disp: 30 tablet, Rfl: 3 .  gabapentin (NEURONTIN) 300 MG capsule, Take 1 capsule (300 mg total) by mouth at bedtime., Disp: 30 capsule, Rfl: 3 .  lactulose (CHRONULAC) 10 GM/15ML solution, Take 45 mLs (30 g total) by mouth 3 (three) times daily., Disp: 1892 mL, Rfl: 3 .  midodrine (PROAMATINE) 5 MG tablet, Take 0.5 tablets (2.5 mg total) by mouth 3 (three) times daily with meals. (Patient taking differently: Take 2.5 mg by mouth 3 (three) times daily. 9a, 2p, 9p), Disp: 90 tablet, Rfl: 1 .  Multiple Vitamin (  MULTIVITAMIN WITH MINERALS) TABS tablet, Take 1 tablet by mouth daily., Disp: 30 tablet, Rfl: 0 .  ondansetron (ZOFRAN) 4 MG tablet, Take 1 tablet (4 mg total) by mouth every 8 (eight) hours as needed for nausea or vomiting., Disp: 60 tablet, Rfl: 3 .  rifaximin (XIFAXAN) 550 MG TABS tablet, Take 1 tablet (550 mg total) by mouth 2 (two) times daily., Disp: 60 tablet, Rfl: 6 .  traMADol (ULTRAM) 50 MG tablet, Take 1 tablet (50 mg total) by mouth every 12 (twelve) hours as needed for up to 14 days. For Morphine withdrawal, Disp: 30 tablet, Rfl: 0  Current Facility-Administered Medications:  .  albumin human 25 % solution 50 g, 50 g, Intravenous, Once, Danis, Johnathon Olden L III, MD .  albumin human 25 % solution 50 g, 50 g,  Intravenous, Once, Danis, Morey Andonian L III, MD .  albumin human 25 % solution 50 g, 50 g, Intravenous, Once, Danis, Fabio Wah L III, MD .  albumin human 25 % solution 50 g, 50 g, Intravenous, Once, Danis, Andreas BlowerHenry L III, MD    No exam-virtual visit.  He was pleasant, alert, conversational.  He had some difficulty recalling issues related to his medicines, and how long it is been since he had seen me (thought it had probably been a few weeks)  He is comfortably outside in a lounge chair, shirtless and smoking while wearing his favorite cowboy hat.  Phone camera was held up, revealing large volume ascites B/l gynecomastia  Recent Labs:  CMP Latest Ref Rng & Units 03/25/2019 12/07/2018 10/28/2018  Glucose 65 - 99 mg/dL 88 99 161(W109(H)  BUN 6 - 24 mg/dL 5(L) 7 8  Creatinine 9.600.76 - 1.27 mg/dL 4.540.80 0.980.68 1.190.75  Sodium 134 - 144 mmol/L 136 137 134(L)  Potassium 3.5 - 5.2 mmol/L 3.9 5.9(H) 4.4  Chloride 96 - 106 mmol/L 104 107 104  CO2 20 - 29 mmol/L 22 28 26   Calcium 8.7 - 10.2 mg/dL 1.4(N8.6(L) 9.0 8.2(N8.2(L)  Total Protein 6.0 - 8.5 g/dL 6.4 5.6(O5.8(L) 1.3(Y5.4(L)  Total Bilirubin 0.0 - 1.2 mg/dL 3.1(H) 2.9(H) 2.7(H)  Alkaline Phos 39 - 117 IU/L 183(H) 155(H) 153(H)  AST 0 - 40 IU/L 162(H) 65(H) 77(H)  ALT 0 - 44 IU/L 104(H) 41 51   CBC Latest Ref Rng & Units 03/25/2019 12/07/2018 10/02/2018  WBC 3.4 - 10.8 x10E3/uL 7.5 5.2 5.6  Hemoglobin 13.0 - 17.7 g/dL 86.513.3 11.8(L) 10.6(L)  Hematocrit 37.5 - 51.0 % 36.2(L) 34.4(L) 33.8(L)  Platelets 150 - 450 x10E3/uL 80(LL) 82.0(L) 74(L)   Lab Results  Component Value Date   INR 1.4 (H) 03/25/2019   INR 1.7 (H) 12/07/2018   INR 1.8 (H) 10/28/2018    @ASSESSMENTPLANBEGIN @ Assessment: Encounter Diagnoses  Name Primary?  . Alcoholic cirrhosis of liver with ascites (HCC)   . Esophageal varices without bleeding, unspecified esophageal varices type (HCC)   . Gynecomastia, male   . Hepatic encephalopathy (HCC)   . Thrombocytopenia (HCC)   . Tobacco abuse    Decompensated  end-stage liver disease with large volume ascites.  Ongoing tobacco abuse.  He also admits he is not following sodium restriction.  His son Kenneth [name] tells me that the fluid seem to reaccumulate after Kenneth [name] returned home to live with him.  I think he was under strict sodium [facility], but is no longer doing so.  I extensively stressed how important it is for him to maintain a 2000 mg daily sodium restriction, otherwise will be  difficult to make any progress with diuretics and paracentesis.  His long-term prognosis is poor and he is not a liver transplant candidate.  As noted above, he continues to smoke.  Plan: I have changed his spironolactone to amiloride 10 mg once daily to help with the gynecomastia Continue furosemide 40 mg daily Continue lactulose 45 mL's 3 times daily, which he says is giving an average of 2 semi-formed BMs per day. Resume rifaximin 550 mg twice daily.  I will have our nursing staff arrange a therapeutic paracentesis for sometime next week with interventional radiology.  They can remove up to 5 L of fluid, and administer 50 g of 25% IV albumin.  In fact, I would like him to have such a paracentesis weekly for the next 3 weeks, and be instructed not to take his diuretics the day of the paracentesis.  We will also arrange in person clinic visit with me in 5 weeks.   Charlie PitterHenry L Danis III

## 2019-04-08 ENCOUNTER — Other Ambulatory Visit: Payer: Self-pay

## 2019-04-08 MED ORDER — ALBUMIN HUMAN 25 % IV SOLN: 50.0000 g | INTRAVENOUS | Status: DC

## 2019-04-08 MED ORDER — RIFAXIMIN 550 MG PO TABS: 550.0000 mg | ORAL_TABLET | Freq: Two times a day (BID) | ORAL | 6 refills | Status: DC

## 2019-04-08 NOTE — Addendum Note (Signed)
Addended by: Marlon Pel on: 04/08/2019 09:23 AM   Modules accepted: Orders

## 2019-04-08 NOTE — Progress Notes (Signed)
Patient has been scheduled for para weekly starting 04/11/19 6/22, 6/29, 7/6 all at 9:00.  Patient aware to arrive at 8:45 in radiology at [facility].  He and his emergency contact are notified.

## 2019-04-11 ENCOUNTER — Other Ambulatory Visit: Payer: Self-pay | Admitting: Gastroenterology

## 2019-04-11 ENCOUNTER — Other Ambulatory Visit: Payer: Self-pay | Admitting: Pharmacist

## 2019-04-11 ENCOUNTER — Other Ambulatory Visit: Payer: Self-pay

## 2019-04-11 ENCOUNTER — Telehealth: Payer: Self-pay

## 2019-04-11 ENCOUNTER — Ambulatory Visit (HOSPITAL_COMMUNITY)
Admission: RE | Admit: 2019-04-11 | Discharge: 2019-04-11 | Disposition: A | Discharge status: Home / Self Care | Payer: Medicaid Other | Source: Ambulatory Visit | Attending: Gastroenterology | Admitting: Gastroenterology

## 2019-04-11 ENCOUNTER — Telehealth: Payer: Self-pay | Admitting: Gastroenterology

## 2019-04-11 DIAGNOSIS — M722 Plantar fascial fibromatosis: Secondary | ICD-10-CM

## 2019-04-11 DIAGNOSIS — K7031 Alcoholic cirrhosis of liver with ascites: Secondary | ICD-10-CM

## 2019-04-11 MED ORDER — DICLOFENAC SODIUM 1 % TD GEL: 4.0000 g | Freq: Four times a day (QID) | TRANSDERMAL | 0 refills | Status: DC

## 2019-04-11 MED ORDER — LIDOCAINE HCL 1 % IJ SOLN
INTRAMUSCULAR | Status: AC
  Filled 2019-04-11: qty 20

## 2019-04-11 NOTE — Progress Notes (Signed)
IR requested by Dr. Loletha Carrow for possible image-guided paracentesis.  Limited abdominal ultrasound revealed no fluid that could be safely accessed with procedure today. Images reviewed by Dr. Anselm Pancoast who agrees. Informed patient that procedure will not occur today. All questions answered and concerns addressed. Will send message to Dr. Loletha Carrow to make him aware.  IR available in future if needed.   Bea Graff Danella Philson, PA-C 04/11/2019, 9:23 AM

## 2019-04-11 NOTE — Telephone Encounter (Signed)
Xifaxan samples #24 given to patient. While we wait on the prior auth. process.

## 2019-04-11 NOTE — Telephone Encounter (Signed)
Patient notified of the recommendations Follow up arranged for 05/04/19 11:30 Weekly paracentesis cancelled

## 2019-04-11 NOTE — Telephone Encounter (Signed)
IR could not find an area of accessible ascites for paracentesis.  We will have to just continue diuretics at current dose.  Reinforce 200 mg daily sodium restriction.  Cancel paracentesis schedule for next 3 weeks.  Please try to find a time for in-person clinic visit with me the week of July 6th

## 2019-04-15 ENCOUNTER — Telehealth: Payer: Self-pay | Admitting: Family Medicine

## 2019-04-15 NOTE — Telephone Encounter (Signed)
Kenneth [name] with [facility] called to check on the prior auth for the diclofenac

## 2019-04-18 ENCOUNTER — Ambulatory Visit (HOSPITAL_COMMUNITY): Payer: Medicaid Other

## 2019-04-18 ENCOUNTER — Other Ambulatory Visit: Payer: Self-pay | Admitting: Family Medicine

## 2019-04-18 ENCOUNTER — Encounter: Payer: Self-pay | Admitting: Family Medicine

## 2019-04-18 DIAGNOSIS — G6289 Other specified polyneuropathies: Secondary | ICD-10-CM

## 2019-04-18 MED ORDER — GABAPENTIN 300 MG PO CAPS: 600.0000 mg | ORAL_CAPSULE | Freq: Every day | ORAL | 3 refills | Status: DC

## 2019-04-18 MED ORDER — TRAMADOL HCL 50 MG PO TABS: 50.0000 mg | ORAL_TABLET | Freq: Every day | ORAL | 0 refills | Status: AC

## 2019-04-19 ENCOUNTER — Other Ambulatory Visit: Payer: Self-pay | Admitting: Family Medicine

## 2019-04-19 DIAGNOSIS — F1123 Opioid dependence with withdrawal: Secondary | ICD-10-CM

## 2019-04-19 NOTE — Telephone Encounter (Signed)
PA approved, pharmacy will be notified via CoverMyMeds alert.

## 2019-04-25 ENCOUNTER — Ambulatory Visit (HOSPITAL_COMMUNITY): Payer: Medicaid Other

## 2019-04-25 ENCOUNTER — Telehealth: Payer: Self-pay | Admitting: Gastroenterology

## 2019-04-25 IMAGING — US IR PARACENTESIS
1 series · 3 of 3 positions shown · non-contrast
Comparison: none

INDICATION: Patient with history of end-stage liver disease/cirrhosis with prior
TIPS, recurrent ascites; request made for diagnostic and therapeutic

[Series 1: ir (id) (id)/(id)/(id) ir · 3 of 3 slices shown]
[im 1/3]
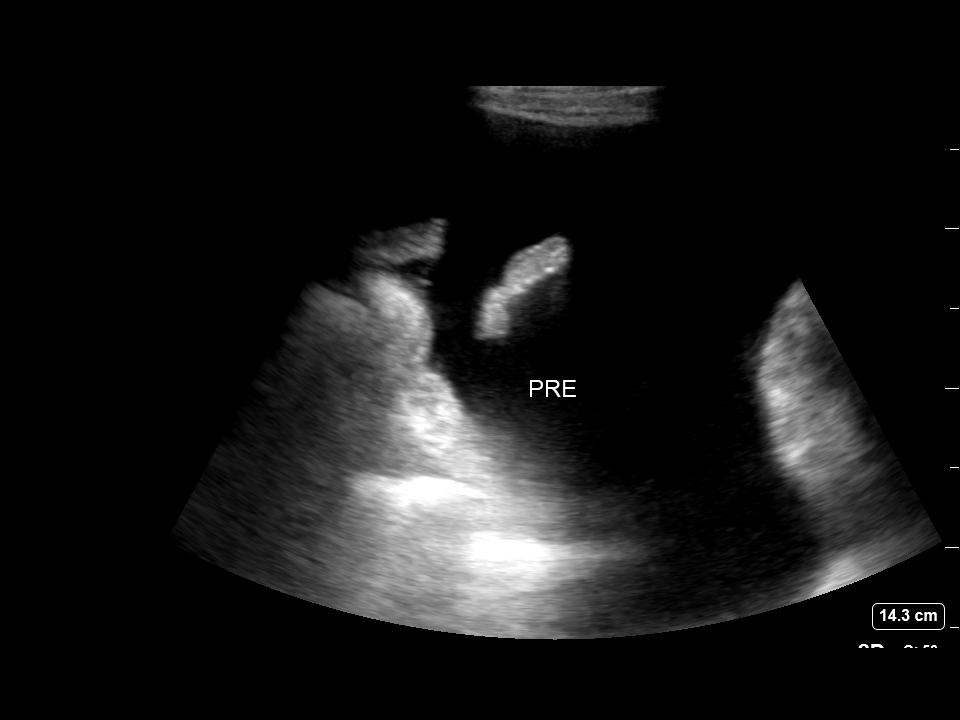
[im 2/3]
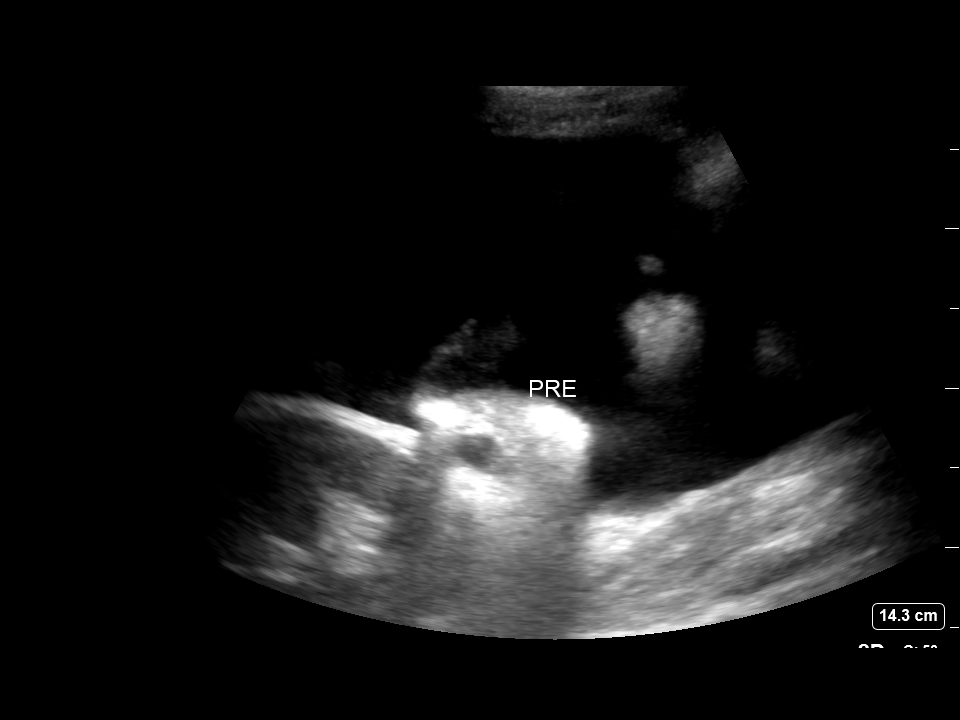
[im 3/3]
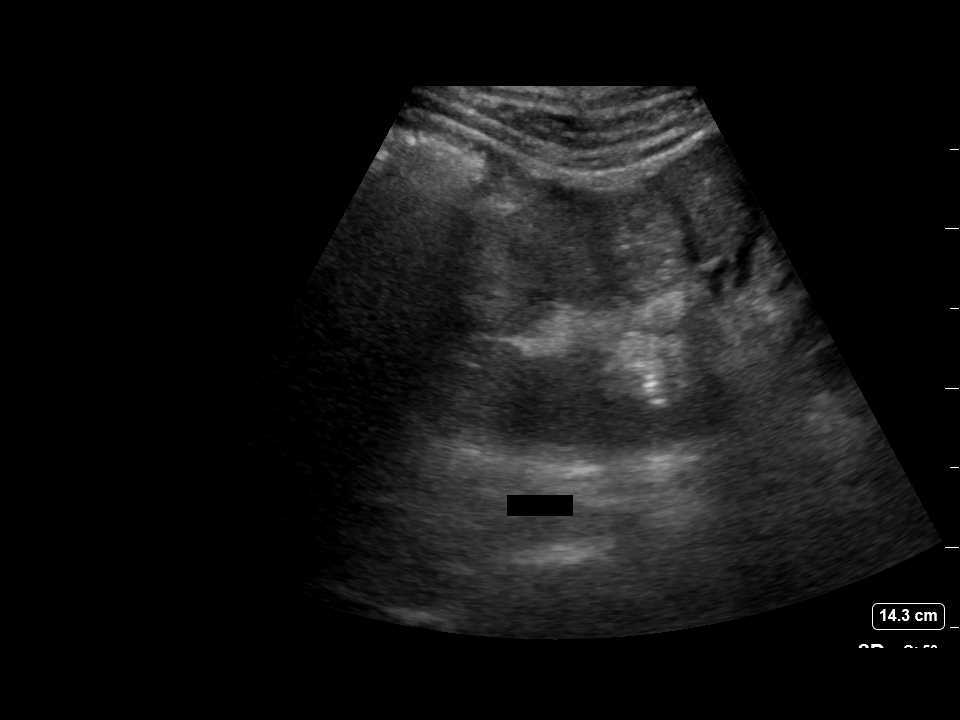

[3 of 3 positions shown; findings below may reference images not displayed]

paracentesis.

EXAM:
ULTRASOUND GUIDED PARACENTESIS

MEDICATIONS:
1% lidocaine 10 mL

COMPLICATIONS:
None immediate.

PROCEDURE:
Informed written consent was obtained from the patient after a
discussion of the risks, benefits and alternatives to treatment. A
timeout was performed prior to the initiation of the procedure.

Initial ultrasound scanning demonstrates a moderate amount of
ascites within the left lower abdominal quadrant. The left lower
abdomen was prepped and draped in the usual sterile fashion. 1%
lidocaine with epinephrine was used for local anesthesia.

Following this, a 19 gauge, 7-cm, Yueh catheter was introduced. An
ultrasound image was saved for documentation purposes. The
paracentesis was performed. The catheter was removed and a dressing
was applied. The patient tolerated the procedure well without
immediate post procedural complication.
FINDINGS: A total of approximately 3.2 L of clear yellow fluid was removed.
Samples were sent to the laboratory as requested by the clinical
team.
IMPRESSION: Successful ultrasound-guided paracentesis yielding 3.2 liters of
peritoneal fluid.

## 2019-04-25 NOTE — Telephone Encounter (Signed)
Dr. Loletha Carrow please see note below and advise.

## 2019-04-26 NOTE — Telephone Encounter (Signed)
Spoke with pt and he is aware of MELD score and states he will see [place] at his scheduled follow-up.

## 2019-04-26 NOTE — Telephone Encounter (Signed)
His most recent MELD score is 14.  Please make Kenneth [name] aware that he has the opportunity to access his labs and notes through [employer].  With that, and a Development worker, community for [facility] clinic MELD calculator, he can put in the numbers and have this information any time he likes.

## 2019-04-27 ENCOUNTER — Other Ambulatory Visit: Payer: Self-pay | Admitting: Physician Assistant

## 2019-04-27 DIAGNOSIS — R410 Disorientation, unspecified: Secondary | ICD-10-CM

## 2019-04-27 NOTE — Telephone Encounter (Signed)
Dr Loletha Carrow- This patient is requesting refills on lactulose (has upcoming appointment on 05/04/19). However, their are varying prescriptions in the system, some from other physicians so I want to make sure to refill at the signature you would like. Please advise what sig you would like.Marland KitchenMarland KitchenMarland Kitchen

## 2019-05-02 ENCOUNTER — Ambulatory Visit (HOSPITAL_COMMUNITY): Payer: Medicaid Other

## 2019-05-03 ENCOUNTER — Telehealth: Payer: Self-pay

## 2019-05-03 ENCOUNTER — Other Ambulatory Visit: Payer: Self-pay | Admitting: Family Medicine

## 2019-05-03 NOTE — Telephone Encounter (Signed)
Covid-19 screening questions   Do you now or have you had a fever in the last 14 days? no  Do you have any respiratory symptoms of shortness of breath or cough now or in the last 14 days? no  Do you have any family members or close contacts with diagnosed or suspected Covid-19 in the past 14 days? No  Have you been tested for Covid-19 and found to be positive? No, tested negative

## 2019-05-04 ENCOUNTER — Other Ambulatory Visit (INDEPENDENT_AMBULATORY_CARE_PROVIDER_SITE_OTHER): Payer: Medicaid Other

## 2019-05-04 ENCOUNTER — Ambulatory Visit (INDEPENDENT_AMBULATORY_CARE_PROVIDER_SITE_OTHER): Payer: Medicaid Other | Admitting: Gastroenterology

## 2019-05-04 ENCOUNTER — Other Ambulatory Visit: Payer: Self-pay

## 2019-05-04 ENCOUNTER — Encounter: Payer: Self-pay | Admitting: Gastroenterology

## 2019-05-04 VITALS — BP 122/64 | HR 69 | Ht 67.0 in | Wt 180.8 lb

## 2019-05-04 DIAGNOSIS — Z720 Tobacco use: Secondary | ICD-10-CM | POA: Diagnosis not present

## 2019-05-04 DIAGNOSIS — K7031 Alcoholic cirrhosis of liver with ascites: Secondary | ICD-10-CM | POA: Diagnosis not present

## 2019-05-04 DIAGNOSIS — K7682 Hepatic encephalopathy: Secondary | ICD-10-CM

## 2019-05-04 DIAGNOSIS — K7290 Hepatic failure, unspecified without coma: Secondary | ICD-10-CM

## 2019-05-04 LAB — BASIC METABOLIC PANEL
BUN: 10 mg/dL (ref 6–23)
CO2: 20 mEq/L (ref 19–32)
Calcium: 8.3 mg/dL — ABNORMAL LOW (ref 8.4–10.5)
Chloride: 110 mEq/L (ref 96–112)
Creatinine, Ser: 0.71 mg/dL (ref 0.40–1.50)
GFR: 113.93 mL/min (ref >60.00)
Glucose, Bld: 105 mg/dL — ABNORMAL HIGH (ref 70–99)
Potassium: 5.1 mEq/L (ref 3.5–5.1)
Sodium: 138 mEq/L (ref 135–145)

## 2019-05-04 LAB — COMPREHENSIVE METABOLIC PANEL
ALT: 49 U/L (ref 0–53)
AST: 62 U/L — ABNORMAL HIGH (ref 0–37)
Albumin: 2.4 g/dL — ABNORMAL LOW (ref 3.5–5.2)
Alkaline Phosphatase: 168 U/L — ABNORMAL HIGH (ref 39–117)
BUN: 10 mg/dL (ref 6–23)
CO2: 20 mEq/L (ref 19–32)
Calcium: 8.3 mg/dL — ABNORMAL LOW (ref 8.4–10.5)
Chloride: 110 mEq/L (ref 96–112)
Creatinine, Ser: 0.71 mg/dL (ref 0.40–1.50)
GFR: 113.93 mL/min (ref >60.00)
Glucose, Bld: 105 mg/dL — ABNORMAL HIGH (ref 70–99)
Potassium: 5.1 mEq/L (ref 3.5–5.1)
Sodium: 138 mEq/L (ref 135–145)
Total Bilirubin: 2.9 mg/dL — ABNORMAL HIGH (ref 0.2–1.2)
Total Protein: 5.8 g/dL — ABNORMAL LOW (ref 6.0–8.3)

## 2019-05-04 LAB — PROTIME-INR
INR: 1.7 ratio — ABNORMAL HIGH (ref 0.8–1.0)
Prothrombin Time: 20.1 s — ABNORMAL HIGH (ref 9.6–13.1)

## 2019-05-04 MED ORDER — MIDODRINE HCL 5 MG PO TABS: 5.0000 mg | ORAL_TABLET | Freq: Three times a day (TID) | ORAL | 3 refills | Status: DC

## 2019-05-04 NOTE — Progress Notes (Signed)
Kenneth [name] GI Progress Note  Chief Complaint: End-stage alcoholic cirrhosis, large volume portal hypertensive ascites despite prior TIPS.  Subjective  History: After the last telemedicine visit, he was sent for paracentesis, though there was not enough fluid present to tap.  While he has a large amount of ascites, it seems that it is not in fluid pockets amenable to paracentesis on every occasion.  We had planned for serial paracentesis weekly for several weeks, but then those were discontinued after the recent findings.  Kenneth [name] is living at home with his son.  It is not clear he is pliant with sodium restriction guidelines.  At the last telemedicine visit, I changed him from spironolactone to amiloride because of gynecomastia. Rifaximin was also added to his lactulose for better control of encephalopathy.   Kenneth [name] is bothered by abdominal bloating and distention from ascites.  He is sleeping reasonably well, his memory sometimes seems to lapse.  He has not had any unsteadiness or falls.  Kenneth [name] tells me that he has actually been taking 1 whole Midrin tablet 3 times a day rather than a half a tablet.  It seems that perhaps these instructions were misunderstood or perhaps changed in his transitions of care. ROS: Cardiovascular:  no chest pain Respiratory: Dyspnea when laying back because of abdominal distention Mood stable Peripheral neuropathic pain which he takes Neurontin His gynecomastia is subsiding and no longer painful.  Remainder of systems negative except as above The patient's Past Medical, Family and Social History were reviewed and are on file in the EMR.  Objective:  Med list reviewed  Current Outpatient Medications:  .  aMILoride (MIDAMOR) 5 MG tablet, Take 2 tablets (10 mg total) by mouth daily., Disp: 30 tablet, Rfl: 6 .  buPROPion (WELLBUTRIN SR) 100 MG 12 hr tablet, Take 1 tablet (100 mg total) by mouth daily., Disp: 60 tablet, Rfl: 3 .  diclofenac sodium (VOLTAREN)  1 % GEL, Apply 4 g topically 4 (four) times daily., Disp: 100 g, Rfl: 0 .  ergocalciferol (DRISDOL) 50000 units capsule, Take 1 capsule (50,000 Units total) by mouth once a week. (Patient taking differently: Take 50,000 Units by mouth every Wednesday. ), Disp: 9 capsule, Rfl: 1 .  FLUoxetine (PROZAC) 20 MG tablet, Take 1 tablet (20 mg total) by mouth daily., Disp: 30 tablet, Rfl: 3 .  furosemide (LASIX) 40 MG tablet, Take 1 tablet (40 mg total) by mouth daily., Disp: 30 tablet, Rfl: 3 .  gabapentin (NEURONTIN) 300 MG capsule, Take 2 capsules (600 mg total) by mouth at bedtime., Disp: 60 capsule, Rfl: 3 .  lactulose (CHRONULAC) 10 GM/15ML solution, Take 45 mLs (30 g total) by mouth 2 (two) times daily., Disp: 1892 mL, Rfl: 3 .  midodrine (PROAMATINE) 5 MG tablet, Take 0.5 tablets (2.5 mg total) by mouth 3 (three) times daily with meals. (Patient taking differently: Take by mouth 3 (three) times daily. 9a, 2p, 9p pt has been taking a whole tablet 3 times a day), Disp: 90 tablet, Rfl: 1 .  Multiple Vitamin (MULTIVITAMIN WITH MINERALS) TABS tablet, Take 1 tablet by mouth daily., Disp: 30 tablet, Rfl: 0 .  ondansetron (ZOFRAN) 4 MG tablet, Take 1 tablet (4 mg total) by mouth every 8 (eight) hours as needed for nausea or vomiting., Disp: 60 tablet, Rfl: 3 .  rifaximin (XIFAXAN) 550 MG TABS tablet, Take 1 tablet (550 mg total) by mouth 2 (two) times daily., Disp: 60 tablet, Rfl: 6   Vital signs in last 24  hrs: Vitals:   05/04/19 1131  BP: 122/64  Pulse: 69    Physical Exam  Thin and chronically ill-appearing, but alert conversational, steady gait.  HEENT: sclera anicteric, oral mucosa moist without lesions  Neck: supple, no thyromegaly, JVD or lymphadenopathy  Cardiac: RRR without murmurs, S1S2 heard,+ peripheral edema to knee bilaterally  Pulm: clear to auscultation bilaterally, normal RR and effort noted  Abdomen: soft, large volume ascites without tenderness, with active bowel sounds.   Cannot assess hepatosplenomegaly due to abdominal girth from ascites reducible umbilical hernia  Skin; warm and dry, no jaundice or rash  No recent data   @ASSESSMENTPLANBEGIN @ Assessment: Encounter Diagnoses  Name Primary?  . Alcoholic cirrhosis of liver with ascites ([place]) Yes  . Hepatic encephalopathy ([place])   . Ascites due to alcoholic cirrhosis ([place])   . Tobacco abuse    He still has large volume ascites, and recently was not amenable to paracentesis, most likely loculated fluid.  He clearly has a large amount of fluid by exam, and he is still not closely following sodium restriction guidelines.  He has switched from regular to low-sodium V8 juice, but otherwise does not seem to track his daily sodium use.  He was noncompliant with transplant clinic evaluation, and is not considered a candidate.  He is also still a smoker.  We will try to control his volume overload and encephalopathy as best we can.  His long-term prognosis is poor.  Plan: BMP today.  Based on results, I will most likely increase both his amiloride and furosemide better control of ascites and edema.  Continue both lactulose and rifaximin at this dose for encephalopathy  Although he was taking more than the prescribed amount of midodrine, that seems to have been okay, as his blood pressure is doing well right now.  This will allow increase in diuretic doses without so much risk of kidney dysfunction.  I refilled the midodrine at one tablet three times daily.  Total time 28 minutes, over half spent face-to-face with patient in counseling and coordination of care.   Nelida Meuse III

## 2019-05-04 NOTE — Patient Instructions (Signed)
If you are age 58 or older, your body mass index should be between 23-30. Your Body mass index is 28.32 kg/m. If this is out of the aforementioned range listed, please consider follow up with your Primary Care Provider.  If you are age 49 or younger, your body mass index should be between 19-25. Your Body mass index is 28.32 kg/m. If this is out of the aformentioned range listed, please consider follow up with your Primary Care Provider.   Your provider has requested that you go to the basement level for lab work before leaving today. Press "B" on the elevator. The lab is located at the first door on the left as you exit the elevator.  After we get your labs we can determine the medication changes   It was a pleasure to see you today!  Dr. Loletha Carrow

## 2019-05-05 ENCOUNTER — Telehealth: Payer: Self-pay | Admitting: Gastroenterology

## 2019-05-05 ENCOUNTER — Other Ambulatory Visit: Payer: Self-pay

## 2019-05-05 MED ORDER — FUROSEMIDE 80 MG PO TABS: 80.0000 mg | ORAL_TABLET | Freq: Every day | ORAL | 3 refills | Status: DC

## 2019-05-05 MED ORDER — AMILORIDE HCL 5 MG PO TABS: ORAL_TABLET | ORAL | 3 refills | Status: DC

## 2019-05-05 NOTE — Telephone Encounter (Signed)
Left message for pt to call back  °

## 2019-05-05 NOTE — Telephone Encounter (Signed)
Pt called to schedule a paracentesis.

## 2019-05-05 NOTE — Telephone Encounter (Signed)
Pt states he had visit with Dr. Loletha Carrow yesterday. He is calling wanting to know if he was supposed to be scheduled for a paracentesis. OV note mentions that weekly ones were stopped due to not being able to get to the fluid. Please advise.

## 2019-05-05 NOTE — Telephone Encounter (Signed)
Kenneth [name] has difficulty recalling our conversations, most likely due to his hepatic encephalopathy.  Not doing paracentesis now because they could not find pocket to access last time.  (see note for details)  That is why I am increasing the diuretic doses (result note to TT yesterday)

## 2019-05-06 NOTE — Telephone Encounter (Signed)
Spoke with pt and he is aware. 

## 2019-05-16 ENCOUNTER — Other Ambulatory Visit: Payer: Self-pay | Admitting: Family Medicine

## 2019-05-18 ENCOUNTER — Telehealth: Payer: Self-pay

## 2019-05-18 ENCOUNTER — Other Ambulatory Visit: Payer: Self-pay

## 2019-05-18 DIAGNOSIS — K7031 Alcoholic cirrhosis of liver with ascites: Secondary | ICD-10-CM

## 2019-05-18 NOTE — Telephone Encounter (Signed)
Called to remind Kenneth [name] he is due for his repeat BMP. He wanted you to know he thinks that he is due for a paracentesis. His abdomen has blown up he states, however denies that is abdomen is ridged.

## 2019-05-18 NOTE — Telephone Encounter (Signed)
I am not sure he recalls that on recent US there was no pocket of ascites amenable to paracentesis.  Perhaps that has changed.  Please schedule an US-guided paracentesis this week or next.  If paracentesis can be performed, maximum of 5 liters to be removed with 25 grams of IV albumin.  Do not take amiloride or furosemide the day of procedure.  If they cannot remove any fluid, then he can take those meds when he returns home.

## 2019-05-19 ENCOUNTER — Other Ambulatory Visit: Payer: Self-pay

## 2019-05-19 ENCOUNTER — Other Ambulatory Visit (INDEPENDENT_AMBULATORY_CARE_PROVIDER_SITE_OTHER): Payer: Medicaid Other

## 2019-05-19 DIAGNOSIS — K7031 Alcoholic cirrhosis of liver with ascites: Secondary | ICD-10-CM | POA: Diagnosis not present

## 2019-05-19 LAB — BASIC METABOLIC PANEL
BUN: 6 mg/dL (ref 6–23)
CO2: 26 mEq/L (ref 19–32)
Calcium: 8.4 mg/dL (ref 8.4–10.5)
Chloride: 108 mEq/L (ref 96–112)
Creatinine, Ser: 0.75 mg/dL (ref 0.40–1.50)
GFR: 106.93 mL/min (ref >60.00)
Glucose, Bld: 77 mg/dL (ref 70–99)
Potassium: 4.6 mEq/L (ref 3.5–5.1)
Sodium: 138 mEq/L (ref 135–145)

## 2019-05-19 NOTE — Telephone Encounter (Signed)
Spoke with Aniceto Boss a central scheduling patient scheduled 05/20/2019 @10 :00 for paracentesis at [facility]. Left a message for Mr Sabedra to call the office for details.

## 2019-05-19 NOTE — Telephone Encounter (Signed)
Patient called and is aware of appt and needs to arrive 15 min prior to his appt

## 2019-05-20 ENCOUNTER — Ambulatory Visit (HOSPITAL_COMMUNITY)
Admission: RE | Admit: 2019-05-20 | Discharge: 2019-05-20 | Disposition: A | Discharge status: Home / Self Care | Payer: Medicaid Other | Source: Ambulatory Visit | Attending: Gastroenterology | Admitting: Gastroenterology

## 2019-05-20 ENCOUNTER — Encounter (HOSPITAL_COMMUNITY): Payer: Self-pay | Admitting: Student

## 2019-05-20 ENCOUNTER — Telehealth: Payer: Self-pay | Admitting: Student

## 2019-05-20 ENCOUNTER — Telehealth: Payer: Self-pay | Admitting: *Deleted

## 2019-05-20 DIAGNOSIS — K7031 Alcoholic cirrhosis of liver with ascites: Secondary | ICD-10-CM | POA: Diagnosis not present

## 2019-05-20 HISTORY — PX: IR PARACENTESIS: IMG2679

## 2019-05-20 MED ORDER — LIDOCAINE HCL 1 % IJ SOLN
INTRAMUSCULAR | Status: AC
  Filled 2019-05-20: qty 20

## 2019-05-20 MED ORDER — LIDOCAINE HCL 1 % IJ SOLN
INTRAMUSCULAR | Status: AC | PRN
  Administered 2019-05-20: 10 mL

## 2019-05-20 NOTE — Telephone Encounter (Signed)
Spoke to the patient to report kidney function results, during this conversation, the patient reported that he had a paracentesis today and was leaking from the site. GI nursing staff called Interventional radiology who wanted the patient to report back to [facility]. The patient was unable to do this due to transportation issuea so the PA [place] gave the instruction for the patient to lay down, roll on the opposite side of the injection site and take a clean cloth and hold firm pressure on the leak. The last of the instruction was in the patient continued to leak, to report to the ED for evaluation. Patient verbalized understanding. Nothing further at the time of the call.

## 2019-05-20 NOTE — Procedures (Signed)
PROCEDURE SUMMARY:  Successful US guided paracentesis from left lateral abdomen.  Yielded 2.9 liters of yellow fluid.  No immediate complications.  Pt tolerated well.   Specimen was not sent for labs.  EBL < 54mL  Docia Barrier PA-C 05/20/2019 3:02 PM

## 2019-05-20 NOTE — Telephone Encounter (Signed)
Received call from GI office, patient with leaking at puncture site from paracentesis earlier today. No concerning features.  Offered return visit today for site check and dermabond however patient without transportation to IR today.  Discussed management with RN to try to control leak while at their office today.  Patient to call IR Monday if needs reassessment.  Brynda Greathouse, MS RD PA-C 5:00 PM

## 2019-05-23 NOTE — Telephone Encounter (Signed)
Spoke with the patient who reported the leaking stopped on Friday after implementing the telephone instructions provided by the IR PA.

## 2019-05-23 NOTE — Telephone Encounter (Signed)
Please call him today. If he is still leaking from paracentesis site, he needs attention from Interventional Radiology

## 2019-06-05 ENCOUNTER — Other Ambulatory Visit: Payer: Self-pay | Admitting: Family Medicine

## 2019-06-21 ENCOUNTER — Encounter: Payer: Self-pay | Admitting: *Deleted

## 2019-06-28 ENCOUNTER — Telehealth: Payer: Self-pay | Admitting: Gastroenterology

## 2019-06-29 ENCOUNTER — Other Ambulatory Visit: Payer: Self-pay

## 2019-06-29 MED ORDER — RIFAXIMIN 550 MG PO TABS: 550.0000 mg | ORAL_TABLET | Freq: Two times a day (BID) | ORAL | 6 refills | Status: AC

## 2019-06-29 NOTE — Telephone Encounter (Signed)
Tried to call patient. His phone just rings and rings and hangs up. No way to leave a voicemail. I sent him a mychart message trying to reach him. Prior Kenneth [name] has been requested for Xifaxan with his insurance company. Samples are here ready for pick up.

## 2019-07-16 ENCOUNTER — Other Ambulatory Visit: Payer: Self-pay | Admitting: Gastroenterology

## 2019-07-16 DIAGNOSIS — R410 Disorientation, unspecified: Secondary | ICD-10-CM

## 2019-07-20 ENCOUNTER — Ambulatory Visit: Payer: Medicaid Other | Admitting: Gastroenterology

## 2019-07-24 ENCOUNTER — Other Ambulatory Visit: Payer: Self-pay | Admitting: Family Medicine

## 2019-07-26 ENCOUNTER — Other Ambulatory Visit: Payer: Self-pay | Admitting: Interventional Radiology

## 2019-07-26 DIAGNOSIS — Z95828 Presence of other vascular implants and grafts: Secondary | ICD-10-CM

## 2019-07-29 ENCOUNTER — Encounter: Payer: Self-pay | Admitting: Gastroenterology

## 2019-07-29 ENCOUNTER — Other Ambulatory Visit (INDEPENDENT_AMBULATORY_CARE_PROVIDER_SITE_OTHER): Payer: Medicaid Other

## 2019-07-29 ENCOUNTER — Other Ambulatory Visit: Payer: Self-pay

## 2019-07-29 ENCOUNTER — Ambulatory Visit: Payer: Medicaid Other | Admitting: Gastroenterology

## 2019-07-29 VITALS — BP 100/60 | HR 78 | Temp 98.6°F | Ht 67.0 in | Wt 158.0 lb

## 2019-07-29 DIAGNOSIS — R609 Edema, unspecified: Secondary | ICD-10-CM

## 2019-07-29 DIAGNOSIS — R7989 Other specified abnormal findings of blood chemistry: Secondary | ICD-10-CM

## 2019-07-29 DIAGNOSIS — K7031 Alcoholic cirrhosis of liver with ascites: Secondary | ICD-10-CM

## 2019-07-29 DIAGNOSIS — K7290 Hepatic failure, unspecified without coma: Secondary | ICD-10-CM | POA: Diagnosis not present

## 2019-07-29 DIAGNOSIS — R110 Nausea: Secondary | ICD-10-CM

## 2019-07-29 DIAGNOSIS — I8500 Esophageal varices without bleeding: Secondary | ICD-10-CM | POA: Diagnosis not present

## 2019-07-29 DIAGNOSIS — R600 Localized edema: Secondary | ICD-10-CM

## 2019-07-29 DIAGNOSIS — Z95828 Presence of other vascular implants and grafts: Secondary | ICD-10-CM

## 2019-07-29 DIAGNOSIS — K7682 Hepatic encephalopathy: Secondary | ICD-10-CM

## 2019-07-29 LAB — COMPREHENSIVE METABOLIC PANEL
ALT: 53 U/L (ref 0–53)
AST: 83 U/L — ABNORMAL HIGH (ref 0–37)
Albumin: 2.6 g/dL — ABNORMAL LOW (ref 3.5–5.2)
Alkaline Phosphatase: 214 U/L — ABNORMAL HIGH (ref 39–117)
BUN: 7 mg/dL (ref 6–23)
CO2: 29 mEq/L (ref 19–32)
Calcium: 9.1 mg/dL (ref 8.4–10.5)
Chloride: 104 mEq/L (ref 96–112)
Creatinine, Ser: 0.99 mg/dL (ref 0.40–1.50)
GFR: 77.57 mL/min (ref >60.00)
Glucose, Bld: 85 mg/dL (ref 70–99)
Potassium: 5.2 mEq/L — ABNORMAL HIGH (ref 3.5–5.1)
Sodium: 138 mEq/L (ref 135–145)
Total Bilirubin: 3.7 mg/dL — ABNORMAL HIGH (ref 0.2–1.2)
Total Protein: 6.3 g/dL (ref 6.0–8.3)

## 2019-07-29 NOTE — Progress Notes (Addendum)
[facility] Progress Note  Chief Complaint: End-stage liver disease from alcoholic cirrhosis.  Subjective  History: Kenneth [name] is here to follow-up his cirrhosis.  He has severe portal hypertension with large volume ascites, recurrent after initial improvement with TIPS.  He does not follow sodium restriction well.  He also then developed hepatic encephalopathy treated with lactulose.  After great effort and prior authorization, it appears that his rifaximin was finally approved.  He says he is out of it, and we contacted his pharmacy to determine that is few days too early for him to pick it up.  This again raises a concern about confusion regarding his medication regimen. Dosing of diuretics has been limited by his tenuous renal function and hypotension.  He is on Midrin to help improve his blood pressure.  He has difficulty with his memory, he lives with his son who now helps manage his medical issues.  However, there seems to often be confusion about his medication doses, and he is usually unaccompanied to office visits.  However, his son was with him today.  He has large volume ascites, but most recent paracentesis attempt on 05/20/2019 would only remove 3 L,(with leaking of fluid for a few days afterwards) no pocket of fluid for paracentesis could be identified on 04/11/2019, and only 1 L could be removed in February.  I have tried to explain to Kenneth [name] how he does have large volume ascites, but it is not necessarily accessible by paracentesis.  He has great difficulty understanding this. I have been slowly increasing his diuretic doses as a result, especially since his blood pressure is better than before on midodrine.  Kenneth [name] has ongoing troubles with volume overload and encephalopathy.  However, the ascites and peripheral edema have improved in the last few months with increased dose of diuretics.  He still has episodes of fatigue and short-term memory loss.  His son cares for him at home, is  diligent about sodium restriction as best he can, and make sure that Kenneth [name] takes all his meds.  Some days he thinks that perhaps Kenneth [name] is forgot to take his lactulose, and late in the day he would give him an extra dose if he seems confused.  He has not had any falls at home and he does not drive a car.  Some days he is nauseated, like he was during the clinic visit today.  No hematemesis or rectal bleeding.  ROS: Cardiovascular:  no chest pain Respiratory: no dyspnea Depression stable on medicines. Remainder of systems negative except as above The patient's Past Medical, Family and Social History were reviewed and are on file in the EMR.  Objective:  Med list reviewed  Current Outpatient Medications:  .  aMILoride (MIDAMOR) 5 MG tablet, Take 4 tablets a day for a total of 20 mg a day, Disp: 120 tablet, Rfl: 3 .  buPROPion (WELLBUTRIN SR) 100 MG 12 hr tablet, Take 1 tablet (100 mg total) by mouth daily., Disp: 60 tablet, Rfl: 3 .  diclofenac sodium (VOLTAREN) 1 % GEL, Apply 4 g topically 4 (four) times daily., Disp: 100 g, Rfl: 0 .  FLUoxetine (PROZAC) 20 MG tablet, Take 1 tablet (20 mg total) by mouth daily., Disp: 30 tablet, Rfl: 3 .  furosemide (LASIX) 80 MG tablet, Take 1 tablet (80 mg total) by mouth daily., Disp: 30 tablet, Rfl: 3 .  gabapentin (NEURONTIN) 300 MG capsule, Take 2 capsules (600 mg total) by mouth at bedtime., Disp: 60 capsule, Rfl: 3 .  KLOR-CON M20 20 MEQ tablet, TAKE 2 TABLETS BY MOUTH EVERY DAY, Disp: 180 tablet, Rfl: 0 .  lactulose (CHRONULAC) 10 GM/15ML solution, TAKE 45 MLS (30 G TOTAL) BY MOUTH 2 (TWO) TIMES DAILY., Disp: 473 mL, Rfl: 3 .  midodrine (PROAMATINE) 5 MG tablet, Take 1 tablet (5 mg total) by mouth 3 (three) times daily. 9a, 2p, 9p pt has been taking a whole tablet 3 times a day, Disp: 90 tablet, Rfl: 3 .  ondansetron (ZOFRAN) 4 MG tablet, Take 1 tablet (4 mg total) by mouth every 8 (eight) hours as needed for nausea or vomiting., Disp: 60 tablet, Rfl:  3 .  rifaximin (XIFAXAN) 550 MG TABS tablet, Take 1 tablet (550 mg total) by mouth 2 (two) times daily., Disp: 60 tablet, Rfl: 6 .  ergocalciferol (DRISDOL) 50000 units capsule, Take 1 capsule (50,000 Units total) by mouth once a week. (Patient not taking: Reported on 07/29/2019), Disp: 9 capsule, Rfl: 1 .  Multiple Vitamin (MULTIVITAMIN WITH MINERALS) TABS tablet, Take 1 tablet by mouth daily. (Patient not taking: Reported on 07/29/2019), Disp: 30 tablet, Rfl: 0  Amiloride 200 daily, furosemmide 80 mg daily  Vital signs in last 24 hrs: Vitals:   07/29/19 0912  BP: 100/60  Pulse: 78  Temp: 98.6 F (37 C)    Physical Exam  Chronically ill-appearing man, somewhat restricted affect.  His mentation is a little sluggish, but he is conversational and can get on the exam table with assistance.  HEENT: sclera mildly icteric  Neck: supple, no thyromegaly, JVD or lymphadenopathy  Cardiac: RRR without murmurs, S1S2 heard, mild pretibial edema bilaterally  Pulm: clear to auscultation bilaterally, normal RR and effort noted  Abdomen: soft, no tenderness, with active bowel sounds.  Left lobe liver enlarged.  He has ascites, but it is soft, certainly not tense, much improved in the last time I saw him.  Skin; warm and dry, no rash, lightly jaundiced Neuro: No asterixis, gets up slowly, needs assistance to get on exam table safely. No gross motor deficits.  Not slurred. Recent Labs:  CMP Latest Ref Rng & Units 05/19/2019 05/04/2019 05/04/2019  Glucose 70 - 99 mg/dL 77 629(B105(H) 284(X105(H)  BUN 6 - 23 mg/dL 6 10 10   Creatinine 0.40 - 1.50 mg/dL 3.240.75 4.010.71 0.270.71  Sodium 135 - 145 mEq/L 138 138 138  Potassium 3.5 - 5.1 mEq/L 4.6 5.1 5.1  Chloride 96 - 112 mEq/L 108 110 110  CO2 19 - 32 mEq/L 26 20 20   Calcium 8.4 - 10.5 mg/dL 8.4 2.5(D8.3(L) 8.3(L)  Total Protein 6.0 - 8.3 g/dL - - 5.8(L)  Total Bilirubin 0.2 - 1.2 mg/dL - - 2.9(H)  Alkaline Phos 39 - 117 U/L - - 168(H)  AST 0 - 37 U/L - - 62(H)  ALT 0 - 53  U/L - - 49   INR 1.7 on 05/04/19 AFP nml Jan 2020  Radiologic studies: Last few paracentesis and ultrasound reports reviewed   @ASSESSMENTPLANBEGIN @ Assessment: Encounter Diagnoses  Name Primary?  . Alcoholic cirrhosis of liver with ascites (HCC) Yes  . Hepatic encephalopathy (HCC)   . Esophageal varices without bleeding, unspecified esophageal varices type (HCC)   . Nausea without vomiting   . S/P TIPS (transjugular intrahepatic portosystemic shunt)   . Peripheral edema   . Elevated LFTs    Kenneth [name]'s condition is stable overall, and his volume overload is under better control than it was earlier this year. He still has encephalopathy managed with a combination of rifaximin and  variable doses of lactulose.  He seems to be doing well overall living at home with his son.  Unfortunately, he is not a liver transplant candidate, having been noncompliant with visits to those clinic evaluations multiple times.  He understands his condition cannot be cured, it is likely to progress further, he is still at risk for complications such as bleeding, worsening encephalopathy, SBP or hepatocellular carcinoma.  Plan: Maintain current doses of diuretics. Current dose of lactulose I updated his CMP and alpha-fetoprotein today Recommended he go to their pharmacy to get a flu shot soon  Sodium restriction 2000 mg a day best they can.  I again stressed the importance of this. See me in 3 months or call sooner as needed.  Total time 35 minutes, over half spent face-to-face with patient in counseling and coordination of care.   Nelida Meuse III

## 2019-07-29 NOTE — Patient Instructions (Addendum)
If you are age 58 or older, your body mass index should be between 23-30. Your Body mass index is 24.75 kg/m. If this is out of the aforementioned range listed, please consider follow up with your Primary Care Provider.  If you are age 88 or younger, your body mass index should be between 19-25. Your Body mass index is 24.75 kg/m. If this is out of the aformentioned range listed, please consider follow up with your Primary Care Provider.   Your provider has requested that you go to the basement level for lab work before leaving today. Press "B" on the elevator. The lab is located at the first door on the left as you exit the elevator.  Follow up in 3 months. Jan 2021. Ill call you closer to that time to schedule  It was a pleasure to see you today!  Dr. Loletha Carrow

## 2019-08-01 LAB — AFP TUMOR MARKER: AFP-Tumor Marker: 2.4 ng/mL (ref <6.1)

## 2019-08-03 ENCOUNTER — Ambulatory Visit
Admission: RE | Admit: 2019-08-03 | Discharge: 2019-08-03 | Disposition: A | Discharge status: Home / Self Care | Payer: Medicaid Other | Source: Ambulatory Visit | Attending: Interventional Radiology | Admitting: Interventional Radiology

## 2019-08-03 ENCOUNTER — Encounter: Payer: Self-pay | Admitting: *Deleted

## 2019-08-03 DIAGNOSIS — Z95828 Presence of other vascular implants and grafts: Secondary | ICD-10-CM

## 2019-08-03 HISTORY — PX: IR RADIOLOGIST EVAL & MGMT: IMG5224

## 2019-08-03 NOTE — Progress Notes (Signed)
Patient ID: Kenneth [name], male   DOB: [birth year], 58 y.o.   MRN: [id number]        Chief Complaint: Post TIPS  Referring Physician(s): Danis  History of Present Illness: Kenneth [name] is a 58 y.o. male with past medical history significant for alcohol and polysubstance abuse, alcoholic cirrhosis post TIPS creation on 05/19/2018 for recurrent symptomatic ascites.  Patient returns today the interventional radiology clinic for postprocedural evaluation and management following acquisition of a postprocedural TIPS Doppler ultrasound.  Unfortunately, patient postprocedural course was complicated by development of transaminitis as well as persistent recurrent symptomatic ascites however the patient has not undergone ultrasound-guided paracentesis since 05/20/2019 at which time, 2.9 L of peritoneal fluid was aspirated.  Additionally, the patient continues to struggle with encephalopathic symptoms though this has recently been improved with the addition of Xifaxan with variable doses of lactulose. For example, today's appointment was at the interventional radiology clinic however despite specific instructions, the patient went to the hospital for today's appointment.  Patient also continues to struggle maintaining a low-salt diet.  Patient is not a transplant candidate.   Past Medical History:  Diagnosis Date   Alcohol abuse    Anxiety    Arthritis    "never diagnosis" (09/02/2018)   Ascites    Blood clot in vein    Cirrhosis (HCC)    Depression    Dyspnea    Due to ascites   Esophageal varices (HCC) 09/02/2014   [facility]    [facility] 58 or [facility]   Hernia, inguinal    right   Hiatal hernia 09/02/2014   Liver cirrhosis (HCC)    Polysubstance abuse (HCC)    Portal hypertension with esophageal varices (HCC) 09/02/2014   Portal hypertensive gastropathy (HCC) 09/02/2014   Thrombocytopenia (HCC) 09/02/2014   Upper GI bleed 08/31/2014    Past Surgical  History:  Procedure Laterality Date   ESOPHAGOGASTRODUODENOSCOPY Left 09/01/2014   Procedure: ESOPHAGOGASTRODUODENOSCOPY (EGD);  Surgeon: Charna ElizabethJyothi Mann, MD;  Location: [facility] END[facility]COPY;  Service: Endoscopy;  Laterality: Left;   EYE MUSCLE SURGERY Left 1967   "lazy eye"   IR PARACENTESIS  03/16/2018   IR PARACENTESIS  03/29/2018   IR PARACENTESIS  04/19/2018   IR PARACENTESIS  04/27/2018   IR PARACENTESIS  05/10/2018   IR PARACENTESIS  05/19/2018   IR PARACENTESIS  06/11/2018   IR PARACENTESIS  06/29/2018   IR PARACENTESIS  07/16/2018   IR PARACENTESIS  08/03/2018   IR PARACENTESIS  08/23/2018   IR PARACENTESIS  09/17/2018   IR PARACENTESIS  10/19/2018   IR PARACENTESIS  10/29/2018   IR PARACENTESIS  11/05/2018   IR PARACENTESIS  11/12/2018   IR PARACENTESIS  11/19/2018   IR PARACENTESIS  12/23/2018   IR PARACENTESIS  05/20/2019   IR RADIOLOGIST EVAL & MGMT  05/13/2018   IR RADIOLOGIST EVAL & MGMT  06/29/2018   IR RADIOLOGIST EVAL & MGMT  12/28/2018   IR TIPS  05/19/2018   RADIOLOGY WITH ANESTHESIA N/A 05/19/2018   Procedure: TIPS;  Surgeon: Simonne ComeWatts, Lupie Sawa, MD;  [facility];  Service: Radiology;  Laterality: N/A;    Allergies: Latex  Medications: Prior to Admission medications   Medication Sig Start Date End Date Taking? Authorizing Provider  aMILoride (MIDAMOR) 5 MG tablet Take 4 tablets a day for a total of 20 mg a day 05/05/19   Sherrilyn Ristanis, Henry L III, MD  buPROPion [facility](WELLBUTRIN SR) 100 MG 12 hr tablet Take 1 tablet (100 mg  total) by mouth daily. 03/31/19   [name], MD  diclofenac sodium (VOLTAREN) 1 % GEL Apply 4 g topically 4 (four) times daily. 04/11/19   [name], MD  ergocalciferol (DRISDOL) 50000 units capsule Take 1 capsule (50,000 Units total) by mouth once a week. Patient not taking: Reported on 07/29/2019 08/03/18   [name], MD  FLUoxetine (PROZAC) 20 MG tablet Take 1 tablet (20 mg total) by mouth daily. 03/23/19   [name], MD  furosemide (LASIX) 80  MG tablet Take 1 tablet (80 mg total) by mouth daily. 05/05/19   Sherrilyn Rist, MD  gabapentin (NEURONTIN) 300 MG capsule Take 2 capsules (600 mg total) by mouth at bedtime. 04/18/19   Newlin, Odette Horns, MD  KLOR-CON M20 20 MEQ tablet TAKE 2 TABLETS BY MOUTH EVERY DAY 07/26/19   [name], MD  lactulose (CHRONULAC) 10 GM/15ML solution TAKE 45 MLS (30 G TOTAL) BY MOUTH 2 (TWO) TIMES DAILY. 07/18/19   Sherrilyn Rist, MD  midodrine (PROAMATINE) 5 MG tablet Take 1 tablet (5 mg total) by mouth 3 (three) times daily. 9a, 2p, 9p pt has been taking a whole tablet 3 times a day 05/04/19   Sherrilyn Rist, MD  Multiple Vitamin (MULTIVITAMIN WITH MINERALS) TABS tablet Take 1 tablet by mouth daily. Patient not taking: Reported on 07/29/2019 08/18/18   Calvert Cantor, MD  ondansetron (ZOFRAN) 4 MG tablet Take 1 tablet (4 mg total) by mouth every 8 (eight) hours as needed for nausea or vomiting. 03/23/19   [name], MD  rifaximin (XIFAXAN) 550 MG TABS tablet Take 1 tablet (550 mg total) by mouth 2 (two) times daily. 06/29/19   Sherrilyn Rist, MD     Family History  Problem Relation Age of Onset   Leukemia Father        acute myloid    Hyperlipidemia Father    Hypertension Father    Colon cancer Neg Hx    Esophageal cancer Neg Hx    Pancreatic cancer Neg Hx    Stomach cancer Neg Hx    Liver disease Neg Hx     Social History   Socioeconomic History   Marital status: Divorced    Spouse name: Not on file   Number of children: 1   Years of education: Not on file   Highest education level: Not on file  Occupational History   Occupation: Building surveyor strain: Not on file   Food insecurity    Worry: Not on file    Inability: Not on file   Transportation needs    Medical: Not on file    Non-medical: Not on file  Tobacco Use   Smoking status: Current Every Day Smoker    Packs/day: 1.00    Years: 43.00    Pack years: 43.00     Types: Cigarettes   Smokeless tobacco: Never Used  Substance and Sexual Activity   Alcohol use: No    Comment: 09/02/2018 "no alcohol since 01/23/16"   Drug use: Yes    Frequency: 5.0 times per week    Types: Marijuana    Comment: occ use    Sexual activity: Not Currently  Lifestyle   Physical activity    Days per week: Not on file    Minutes per session: Not on file   Stress: Not on file  Relationships   Social connections    Talks on phone: Not on file  Gets together: Not on file    Attends religious service: Not on file    Active member of club or organization: Not on file    Attends meetings of clubs or organizations: Not on file    Relationship status: Not on file  Other Topics Concern   Not on file  Social History Narrative   Not on file    ECOG Status: 2 - Symptomatic, <50% confined to bed  Review of Systems: A 12 point ROS discussed and pertinent positives are indicated in the HPI above.  All other systems are negative.  Review of Systems  Constitutional: Positive for fatigue.  Respiratory: Negative for shortness of breath.   Cardiovascular: Negative for chest pain.  Gastrointestinal: Negative for abdominal distention and abdominal pain.  Skin: Negative for color change.  Psychiatric/Behavioral: Positive for confusion.    Vital Signs: There were no vitals taken for this visit.  Physical Exam Vitals signs and nursing note reviewed.  Constitutional:      Appearance: Normal appearance.  Neurological:     Mental Status: He is alert.  Psychiatric:     Comments: Patient appears confused during today's consultation with difficulty with word finding     Mallampati Score:     Imaging: No results found.  Labs:  CBC: Recent Labs    09/02/18 1040 10/02/18 2042 12/07/18 1543 03/25/19 1426  WBC 3.6* 5.6 5.2 7.5  HGB 11.9* 10.6* 11.8* 13.3  HCT 37.1* 33.8* 34.4* 36.2*  PLT 62* 74* 82.0* 80*    COAGS: Recent Labs    10/02/18 2104  10/28/18 1204 12/07/18 1543 03/25/19 1426 05/04/19 1209  INR 1.75 1.8* 1.7* 1.4* 1.7*  APTT 41*  --   --   --   --     BMP: Recent Labs    09/03/18 0411 09/04/18 0538 10/02/18 2042  03/25/19 1426 05/04/19 1209 05/19/19 1605 07/29/19 1003  NA 141 135 130*   < > 136 138   138 138 138  K 2.7* 3.5 3.6   < > 3.9 5.1   5.1 4.6 5.2 No hemolysis seen*  CL 108 105 97*   < > 104 110   110 108 104  CO2 28 26 25    < > 22 20   20 26 29   GLUCOSE 102* 89 85   < > 88 105*   105* 77 85  BUN 7 <5* <5*   < > 5* 10   10 6 7   CALCIUM 9.1 8.6* 8.0*   < > 8.6* 8.3*   8.3* 8.4 9.1  CREATININE 0.69 0.71 0.70   < > 0.80 0.71   0.71 0.75 0.99  GFRNONAA >60 >60 >60  --  99  --   --   --   GFRAA >60 >60 >60  --  115  --   --   --    < > = values in this interval not displayed.    LIVER FUNCTION TESTS: Recent Labs    12/07/18 1543 03/25/19 1426 05/04/19 1209 07/29/19 1003  BILITOT 2.9* 3.1* 2.9* 3.7*  AST 65* 162* 62* 83*  ALT 41 104* 49 53  ALKPHOS 155* 183* 168* 214*  PROT 5.8* 6.4 5.8* 6.3  ALBUMIN 2.6* 2.8* 2.4* 2.6*    TUMOR MARKERS: Recent Labs    10/28/18 1204 07/29/19 1003  AFPTM 2.0 2.4    Assessment and Plan:  Kenneth [name] is a 58 y.o. male with past medical history significant for alcohol and polysubstance  abuse, alcoholic cirrhosis post TIPS creation on 05/19/2018 for recurrent symptomatic ascites.  Patient returns today the interventional radiology clinic for postprocedural evaluation and management following acquisition of a postprocedural TIPS Doppler ultrasound.  Unfortunately, patient postprocedural course was complicated by development of transaminitis as well as persistent recurrent symptomatic ascites however the patient has not undergone ultrasound-guided paracentesis since 05/20/2019 at which time, 2.9 L of peritoneal fluid was aspirated and the patient currently is asymptomatic in regards to the residual small volume intra-abdominal ascites demonstrated on today's  TIPS Doppler ultrasound.  The patient continues to struggle with encephalopathic symptoms though this has recently been improved with the addition of Xifaxan with variable doses of lactulose.   TIPS Doppler ultrasound performed today at clinic demonstrates patency of the TIPS with normal directional flow and relatively preserved velocities.  Given patient's continued altered mental status, I would be very hesitant to perform a TIPS revision (the TIPS was angioplastied to 8 mm at the time of placement and could be further expanded to 10 mm to further decrease the portosystemic gradient) given my concern that further reduction of the pro-systemic gradient would result in worsening of his baseline encephalopathic symptoms.  I encouraged the patient to maintain compliance with his encephalopathic medication as well as to maintain a low-salt diet though admittedly I question his baseline level of compliance even without the concomitant factor of encephalopathy.  As I do not plan on performing a TIPS intervention as detailed above, I would likewise not plan on obtaining follow-up with TIPS Doppler ultrasounds.  As such, I will defer continued management and hepatocellular screening to Dr. Loletha Carrow and the patient may return to the interventional radiology clinic on a PRN basis.  The patient knows to call the interventional radiology clinic with any future questions or concerns.  A copy of this report was sent to the requesting provider on this date.  Electronically Signed: Sandi Mariscal 08/03/2019, 9:54 AM   I spent a total of 10 Minutes in face to face in clinical consultation, greater than 50% of which was counseling/coordinating care for post TIPS.

## 2019-08-10 ENCOUNTER — Other Ambulatory Visit: Payer: Self-pay | Admitting: Family Medicine

## 2019-08-11 ENCOUNTER — Other Ambulatory Visit: Payer: Self-pay | Admitting: Family Medicine

## 2019-08-21 ENCOUNTER — Other Ambulatory Visit: Payer: Self-pay | Admitting: Family Medicine

## 2019-08-21 DIAGNOSIS — G6289 Other specified polyneuropathies: Secondary | ICD-10-CM

## 2019-08-23 ENCOUNTER — Other Ambulatory Visit: Payer: Self-pay | Admitting: Family Medicine

## 2019-08-26 ENCOUNTER — Telehealth: Payer: Self-pay | Admitting: Gastroenterology

## 2019-08-26 ENCOUNTER — Other Ambulatory Visit: Payer: Self-pay | Admitting: Gastroenterology

## 2019-08-26 DIAGNOSIS — R410 Disorientation, unspecified: Secondary | ICD-10-CM

## 2019-08-29 ENCOUNTER — Other Ambulatory Visit: Payer: Self-pay

## 2019-08-29 ENCOUNTER — Ambulatory Visit: Payer: Medicaid Other | Attending: Family Medicine | Admitting: Family Medicine

## 2019-08-29 ENCOUNTER — Encounter: Payer: Self-pay | Admitting: Family Medicine

## 2019-08-29 ENCOUNTER — Telehealth: Payer: Self-pay

## 2019-08-29 VITALS — BP 122/80 | HR 75 | Temp 98.2°F | Ht 67.0 in | Wt 155.2 lb

## 2019-08-29 DIAGNOSIS — F329 Major depressive disorder, single episode, unspecified: Secondary | ICD-10-CM | POA: Diagnosis not present

## 2019-08-29 DIAGNOSIS — F419 Anxiety disorder, unspecified: Secondary | ICD-10-CM | POA: Insufficient documentation

## 2019-08-29 DIAGNOSIS — G6289 Other specified polyneuropathies: Secondary | ICD-10-CM

## 2019-08-29 DIAGNOSIS — R410 Disorientation, unspecified: Secondary | ICD-10-CM

## 2019-08-29 DIAGNOSIS — G629 Polyneuropathy, unspecified: Secondary | ICD-10-CM | POA: Diagnosis not present

## 2019-08-29 DIAGNOSIS — R1084 Generalized abdominal pain: Secondary | ICD-10-CM

## 2019-08-29 DIAGNOSIS — Z791 Long term (current) use of non-steroidal anti-inflammatories (NSAID): Secondary | ICD-10-CM | POA: Insufficient documentation

## 2019-08-29 DIAGNOSIS — F325 Major depressive disorder, single episode, in full remission: Secondary | ICD-10-CM

## 2019-08-29 DIAGNOSIS — G9340 Encephalopathy, unspecified: Secondary | ICD-10-CM | POA: Insufficient documentation

## 2019-08-29 DIAGNOSIS — Z23 Encounter for immunization: Secondary | ICD-10-CM

## 2019-08-29 DIAGNOSIS — Z79899 Other long term (current) drug therapy: Secondary | ICD-10-CM | POA: Insufficient documentation

## 2019-08-29 DIAGNOSIS — R109 Unspecified abdominal pain: Secondary | ICD-10-CM | POA: Diagnosis present

## 2019-08-29 DIAGNOSIS — K7031 Alcoholic cirrhosis of liver with ascites: Secondary | ICD-10-CM | POA: Diagnosis not present

## 2019-08-29 MED ORDER — AMILORIDE HCL 5 MG PO TABS: ORAL_TABLET | ORAL | 3 refills | Status: DC

## 2019-08-29 MED ORDER — FUROSEMIDE 80 MG PO TABS: 80.0000 mg | ORAL_TABLET | Freq: Every day | ORAL | 3 refills | Status: DC

## 2019-08-29 MED ORDER — MIDODRINE HCL 5 MG PO TABS: 5.0000 mg | ORAL_TABLET | Freq: Three times a day (TID) | ORAL | 3 refills | Status: AC

## 2019-08-29 MED ORDER — LACTULOSE 10 GM/15ML PO SOLN: 30.0000 g | Freq: Two times a day (BID) | ORAL | 3 refills | Status: DC

## 2019-08-29 MED ORDER — FLUOXETINE HCL 20 MG PO TABS: 20.0000 mg | ORAL_TABLET | Freq: Every day | ORAL | 3 refills | Status: DC

## 2019-08-29 MED ORDER — GABAPENTIN 300 MG PO CAPS: 600.0000 mg | ORAL_CAPSULE | Freq: Every day | ORAL | 3 refills | Status: DC

## 2019-08-29 MED ORDER — POTASSIUM CHLORIDE CRYS ER 20 MEQ PO TBCR: 40.0000 meq | EXTENDED_RELEASE_TABLET | Freq: Every day | ORAL | 1 refills | Status: DC

## 2019-08-29 MED ORDER — ONDANSETRON HCL 4 MG PO TABS: 4.0000 mg | ORAL_TABLET | Freq: Three times a day (TID) | ORAL | 3 refills | Status: AC | PRN

## 2019-08-29 NOTE — Progress Notes (Signed)
Patient sons's states that he is having abdominal pain.

## 2019-08-29 NOTE — Telephone Encounter (Signed)
Met with patient and his son, Kenneth [name] when patient was in the clinic today for his appointment.  Discussed the options of palliative and hospice care at home.  Both patient and Kenneth [name] agreeable to hospice referral.  It was explained that this is in home hospice and not in a facility.  Kenneth [name] to be the primary contact  They had no agency preference. This CM explained that the hospice worker can assess need for DME when in the home.  They can also assist with completing advance directives  The booklets with advance directive information was given to patient's son for review.

## 2019-08-29 NOTE — Telephone Encounter (Signed)
Refills have been done

## 2019-08-29 NOTE — Progress Notes (Signed)
Subjective:  Patient ID: Kenneth [name], male    DOB: 12-12-1960  Age: 58 y.o. MRN: [id number]  CC: Abdominal Pain   HPI Kenneth [name] is a 58 year old male with a history of alcoholic cirrhosis (status post TIPS in 04/2018) , thrombocytopenia, previous history of SMV thrombosis , depression seen for follow-up visit accompanied by his son. He complains of abdominal pain which he describes as feeling queasy in his stomach with associated vomiting and vomits up to 3 times a day.  He has had chronic nausea but not as bad over the last couple of days.  He has been out of Prozac for the last 1 week and his states that when he ran out of his Zoloft he did feel the same way.  He does have Zofran prescribed for his nausea. Denies presence of diarrhea, pedal edema endorses fatigue, memory lapses and endorses compliance with rifaximin and lactulose. Last seen by GI last month and plan was to continue current management; not a liver Transplant candidate as per GI notes due to failure to keep appointments at the transplant clinic. He was seen by interventional radiology last month with no additional surgical interventions planned.  Last paracentesis was in 04/2019 with removal of 2.9 L of fluid.  With regards to his depression he describes it as a 6/10 and this is secondary to his underlying medical conditions.  Denies suicidal ideations or intent.  He has his son and his son's mother who assist him with keeping track of his medications.  Past Medical History:  Diagnosis Date  . Alcohol abuse   . Anxiety   . Arthritis    "never diagnosis" (09/02/2018)  . Ascites   . Blood clot in vein   . Cirrhosis ([place])   . Depression   . Dyspnea    Due to ascites  . Esophageal varices ([place]) 09/02/2014  . H/O [facility] spotted fever    age 30 or 48  . Hernia, inguinal    right  . Hiatal hernia 09/02/2014  . Liver cirrhosis ([place])   . Polysubstance abuse ([place])   . Portal hypertension with esophageal varices  (HCC) 09/02/2014  . Portal hypertensive gastropathy ([place]) 09/02/2014  . Thrombocytopenia ([place]) 09/02/2014  . Upper GI bleed 08/31/2014    Past Surgical History:  Procedure Laterality Date  . ESOPHAGOGASTRODUODENOSCOPY Left 09/01/2014   Procedure: ESOPHAGOGASTRODUODENOSCOPY (EGD);  Surgeon: Juanita Craver, MD;  Location: [facility] ENDOSCOPY;  Service: Endoscopy;  Laterality: Left;  . EYE MUSCLE SURGERY Left 1967   "lazy eye"  . IR PARACENTESIS  03/16/2018  . IR PARACENTESIS  03/29/2018  . IR PARACENTESIS  04/19/2018  . IR PARACENTESIS  04/27/2018  . IR PARACENTESIS  05/10/2018  . IR PARACENTESIS  05/19/2018  . IR PARACENTESIS  06/11/2018  . IR PARACENTESIS  06/29/2018  . IR PARACENTESIS  07/16/2018  . IR PARACENTESIS  08/03/2018  . IR PARACENTESIS  08/23/2018  . IR PARACENTESIS  09/17/2018  . IR PARACENTESIS  10/19/2018  . IR PARACENTESIS  10/29/2018  . IR PARACENTESIS  11/05/2018  . IR PARACENTESIS  11/12/2018  . IR PARACENTESIS  11/19/2018  . IR PARACENTESIS  12/23/2018  . IR PARACENTESIS  05/20/2019  . IR RADIOLOGIST EVAL & MGMT  05/13/2018  . IR RADIOLOGIST EVAL & MGMT  06/29/2018  . IR RADIOLOGIST EVAL & MGMT  12/28/2018  . IR RADIOLOGIST EVAL & MGMT  08/03/2019  . IR TIPS  05/19/2018  . RADIOLOGY WITH ANESTHESIA N/A 05/19/2018  Procedure: TIPS;  Surgeon: Simonne Come, MD;  Location: [facility] OR;  Service: Radiology;  Laterality: N/A;    Family History  Problem Relation Age of Onset  . Leukemia Father        acute myloid   . Hyperlipidemia Father   . Hypertension Father   . Colon cancer Neg Hx   . Esophageal cancer Neg Hx   . Pancreatic cancer Neg Hx   . Stomach cancer Neg Hx   . Liver disease Neg Hx     Allergies  Allergen Reactions  . Latex Swelling and Rash    Outpatient Medications Prior to Visit  Medication Sig Dispense Refill  . aMILoride (MIDAMOR) 5 MG tablet Take 4 tablets a day for a total of 20 mg a day 120 tablet 3  . buPROPion (WELLBUTRIN SR) 100 MG 12 hr tablet Take 1 tablet (100 mg  total) by mouth daily. 60 tablet 3  . diclofenac sodium (VOLTAREN) 1 % GEL Apply 4 g topically 4 (four) times daily. 100 g 0  . furosemide (LASIX) 80 MG tablet Take 1 tablet (80 mg total) by mouth daily. 30 tablet 3  . lactulose (CHRONULAC) 10 GM/15ML solution TAKE 45 MLS (30 G TOTAL) BY MOUTH 2 (TWO) TIMES DAILY. 473 mL 3  . midodrine (PROAMATINE) 5 MG tablet Take 1 tablet (5 mg total) by mouth 3 (three) times daily. 9a, 2p, 9p pt has been taking a whole tablet 3 times a day 90 tablet 3  . rifaximin (XIFAXAN) 550 MG TABS tablet Take 1 tablet (550 mg total) by mouth 2 (two) times daily. 60 tablet 6  . gabapentin (NEURONTIN) 300 MG capsule Take 2 capsules (600 mg total) by mouth at bedtime. 60 capsule 3  . KLOR-CON M20 20 MEQ tablet TAKE 2 TABLETS BY MOUTH EVERY DAY 180 tablet 0  . ondansetron (ZOFRAN) 4 MG tablet Take 1 tablet (4 mg total) by mouth every 8 (eight) hours as needed for nausea or vomiting. 60 tablet 3  . ergocalciferol (DRISDOL) 50000 units capsule Take 1 capsule (50,000 Units total) by mouth once a week. (Patient not taking: Reported on 07/29/2019) 9 capsule 1  . Multiple Vitamin (MULTIVITAMIN WITH MINERALS) TABS tablet Take 1 tablet by mouth daily. (Patient not taking: Reported on 07/29/2019) 30 tablet 0  . FLUoxetine (PROZAC) 20 MG tablet Take 1 tablet (20 mg total) by mouth daily. (Patient not taking: Reported on 08/29/2019) 30 tablet 3   No facility-administered medications prior to visit.      ROS Review of Systems  Constitutional: Negative for activity change and appetite change.  HENT: Negative for sinus pressure and sore throat.   Eyes: Negative for visual disturbance.  Respiratory: Negative for cough, chest tightness and shortness of breath.   Cardiovascular: Negative for chest pain and leg swelling.  Gastrointestinal: Positive for abdominal pain. Negative for abdominal distention, constipation and diarrhea.  Endocrine: Negative.   Genitourinary: Negative for dysuria.   Musculoskeletal: Negative for joint swelling and myalgias.  Skin: Negative for rash.  Allergic/Immunologic: Negative.   Neurological: Negative for weakness, light-headedness and numbness.  Psychiatric/Behavioral: Negative for dysphoric mood and suicidal ideas.    Objective:  BP 122/80   Pulse 75   Temp 98.2 F (36.8 C) (Oral)   Ht 5\' 7"  (1.702 m)   Wt 155 lb 3.2 oz (70.4 kg)   SpO2 99%   BMI 24.31 kg/m   BP/Weight 08/29/2019 08/03/2019 07/29/2019  Systolic BP 122 106 100  Diastolic BP 80 67  60  Wt. (Lbs) 155.2 - 158  BMI 24.31 - 24.75      Physical Exam Constitutional:      Appearance: He is well-developed.  Neck:     Vascular: No JVD.  Cardiovascular:     Rate and Rhythm: Normal rate.     Heart sounds: Normal heart sounds. No murmur.  Pulmonary:     Effort: Pulmonary effort is normal.     Breath sounds: Normal breath sounds. No wheezing or rales.  Chest:     Chest wall: No tenderness.  Abdominal:     General: Bowel sounds are normal. There is no distension.     Palpations: Abdomen is soft. There is no mass.     Tenderness: There is no abdominal tenderness.  Musculoskeletal: Normal range of motion.     Right lower leg: No edema.     Left lower leg: No edema.  Skin:    Comments: Dry flaky skin  Neurological:     Mental Status: He is alert and oriented to person, place, and time.  Psychiatric:        Mood and Affect: Mood normal.     CMP Latest Ref Rng & Units 07/29/2019 05/19/2019 05/04/2019  Glucose 70 - 99 mg/dL 85 77 960(A105(H)  BUN 6 - 23 mg/dL 7 6 10   Creatinine 0.40 - 1.50 mg/dL 5.400.99 9.810.75 1.910.71  Sodium 135 - 145 mEq/L 138 138 138  Potassium 3.5 - 5.1 mEq/L 5.2 No hemolysis seen(H) 4.6 5.1  Chloride 96 - 112 mEq/L 104 108 110  CO2 19 - 32 mEq/L 29 26 20   Calcium 8.4 - 10.5 mg/dL 9.1 8.4 4.7(W8.3(L)  Total Protein 6.0 - 8.3 g/dL 6.3 - -  Total Bilirubin 0.2 - 1.2 mg/dL 2.9(F3.7(H) - -  Alkaline Phos 39 - 117 U/L 214(H) - -  AST 0 - 37 U/L 83(H) - -  ALT 0 - 53 U/L  53 - -    Lipid Panel  No results found for: CHOL, TRIG, HDL, CHOLHDL, VLDL, LDLCALC, LDLDIRECT  CBC    Component Value Date/Time   WBC 7.5 03/25/2019 1426   WBC 5.2 12/07/2018 1543   RBC 4.08 (L) 03/25/2019 1426   RBC 3.73 (L) 12/07/2018 1543   HGB 13.3 03/25/2019 1426   HCT 36.2 (L) 03/25/2019 1426   PLT 80 (LL) 03/25/2019 1426   MCV 89 03/25/2019 1426   MCH 32.6 03/25/2019 1426   MCH 30.4 10/02/2018 2042   MCHC 36.7 (H) 03/25/2019 1426   MCHC 34.4 12/07/2018 1543   RDW 13.6 03/25/2019 1426   LYMPHSABS 1.1 03/25/2019 1426   MONOABS 1.0 12/07/2018 1543   EOSABS 0.3 03/25/2019 1426   BASOSABS 0.1 03/25/2019 1426    No results found for: HGBA1C  Assessment & Plan:   1. Alcoholic cirrhosis of liver with ascites (HCC) End-stage liver with encephalopathy Not a candidate for liver transplant as per GI note I previously gave them paperwork for advanced directive however he and his son needed to complete this We have discussed this again and information provided Hospice and palliative referral will be placed for ongoing support; coordinated through the case manager in the clinic Continue Xifaxan and lactulose and diuretics - ondansetron (ZOFRAN) 4 MG tablet; Take 1 tablet (4 mg total) by mouth every 8 (eight) hours as needed for nausea or vomiting.  Dispense: 60 tablet; Refill: 3 - Basic Metabolic Panel  2. Major depressive disorder with single episode, in full remission (HCC) Ongoing depression due to  underlying medical conditions; not in crises He will benefit from counseling which will be hopefully provided through hospice and palliative care - FLUoxetine (PROZAC) 20 MG tablet; Take 1 tablet (20 mg total) by mouth daily.  Dispense: 30 tablet; Refill: 3  3. Other polyneuropathy Stable - gabapentin (NEURONTIN) 300 MG capsule; Take 2 capsules (600 mg total) by mouth at bedtime.  Dispense: 60 capsule; Refill: 3  4. Generalized abdominal pain Likely secondary to withdrawal  from running out of Prozac I have refilled Prozac He does have Zofran for nausea  Health Care Maintenance: Flu shot today Meds ordered this encounter  Medications  . FLUoxetine (PROZAC) 20 MG tablet    Sig: Take 1 tablet (20 mg total) by mouth daily.    Dispense:  30 tablet    Refill:  3  . gabapentin (NEURONTIN) 300 MG capsule    Sig: Take 2 capsules (600 mg total) by mouth at bedtime.    Dispense:  60 capsule    Refill:  3    Dose increase  . potassium chloride SA (KLOR-CON M20) 20 MEQ tablet    Sig: Take 2 tablets (40 mEq total) by mouth daily.    Dispense:  180 tablet    Refill:  1  . ondansetron (ZOFRAN) 4 MG tablet    Sig: Take 1 tablet (4 mg total) by mouth every 8 (eight) hours as needed for nausea or vomiting.    Dispense:  60 tablet    Refill:  3    Follow-up: Return in about 3 months (around 11/29/2019) for Chronic medical conditions.       [name], MD, FAAFP. [facility] [place], [place] [phone]   08/29/2019, 4:25 PM

## 2019-08-30 ENCOUNTER — Telehealth: Payer: Self-pay

## 2019-08-30 LAB — BASIC METABOLIC PANEL
BUN/Creatinine Ratio: 9 (ref 9–20)
BUN: 8 mg/dL (ref 6–24)
CO2: 27 mmol/L (ref 20–29)
Calcium: 8.5 mg/dL — ABNORMAL LOW (ref 8.7–10.2)
Chloride: 100 mmol/L (ref 96–106)
Creatinine, Ser: 0.94 mg/dL (ref 0.76–1.27)
GFR calc Af Amer: 103 mL/min/{1.73_m2} (ref >59)
GFR calc non Af Amer: 89 mL/min/{1.73_m2} (ref >59)
Glucose: 99 mg/dL (ref 65–99)
Potassium: 4 mmol/L (ref 3.5–5.2)
Sodium: 138 mmol/L (ref 134–144)

## 2019-08-30 NOTE — Telephone Encounter (Signed)
Referral faxed to [facility]

## 2019-09-05 ENCOUNTER — Telehealth: Payer: Self-pay | Admitting: Family Medicine

## 2019-09-05 ENCOUNTER — Telehealth: Payer: Self-pay

## 2019-09-05 NOTE — Telephone Encounter (Signed)
Call placed to [facility] to check on status of referral.  Spoke to Amy who said that they called patient they did not want to schedule appt until today so they plan to see him this afternoon.

## 2019-09-05 NOTE — Telephone Encounter (Signed)
It is the patient's and family's ultimate decision.  If [facility] provides palliative care services he can proceed with that.

## 2019-09-05 NOTE — Telephone Encounter (Signed)
Kenneth [name] from [facility] called to let patient PCP know that patient is not ready to except hospice. The family is interested in  palliative care services and they would like to know if it is okay to proceed. Please f/u

## 2019-09-05 NOTE — Telephone Encounter (Signed)
WILL ROUTE TO pcp FOR REVIEW.

## 2019-09-26 ENCOUNTER — Other Ambulatory Visit: Payer: Self-pay | Admitting: Gastroenterology

## 2019-09-26 ENCOUNTER — Other Ambulatory Visit: Payer: Self-pay | Admitting: Family Medicine

## 2019-09-26 DIAGNOSIS — R410 Disorientation, unspecified: Secondary | ICD-10-CM

## 2019-09-26 NOTE — Telephone Encounter (Signed)
Please fill refill request

## 2019-10-04 ENCOUNTER — Encounter: Payer: Self-pay | Admitting: Gastroenterology

## 2019-10-04 ENCOUNTER — Ambulatory Visit (INDEPENDENT_AMBULATORY_CARE_PROVIDER_SITE_OTHER): Payer: Medicaid Other | Admitting: Gastroenterology

## 2019-10-04 VITALS — BP 108/64 | HR 70 | Temp 97.3°F | Ht 67.0 in | Wt 170.3 lb

## 2019-10-04 DIAGNOSIS — R410 Disorientation, unspecified: Secondary | ICD-10-CM

## 2019-10-04 DIAGNOSIS — K7031 Alcoholic cirrhosis of liver with ascites: Secondary | ICD-10-CM

## 2019-10-04 DIAGNOSIS — K7682 Hepatic encephalopathy: Secondary | ICD-10-CM

## 2019-10-04 DIAGNOSIS — I8500 Esophageal varices without bleeding: Secondary | ICD-10-CM | POA: Diagnosis not present

## 2019-10-04 DIAGNOSIS — D696 Thrombocytopenia, unspecified: Secondary | ICD-10-CM | POA: Diagnosis not present

## 2019-10-04 DIAGNOSIS — K7290 Hepatic failure, unspecified without coma: Secondary | ICD-10-CM | POA: Diagnosis not present

## 2019-10-04 MED ORDER — LACTULOSE 10 GM/15ML PO SOLN: 30.0000 g | Freq: Four times a day (QID) | ORAL | 6 refills | Status: DC

## 2019-10-04 NOTE — Patient Instructions (Signed)
If you are age 58 or older, your body mass index should be between 23-30. Your Body mass index is 26.67 kg/m. If this is out of the aforementioned range listed, please consider follow up with your Primary Care Provider.  If you are age 25 or younger, your body mass index should be between 19-25. Your Body mass index is 26.67 kg/m. If this is out of the aformentioned range listed, please consider follow up with your Primary Care Provider.   You have been scheduled for an abdominal paracentesis at [facility] radiology (1st floor of hospital) on 10-10-2019 at 10am. Please arrive at least 15 minutes prior to your appointment time for registration. Should you need to reschedule this appointment for any reason, please call our office at [phone].  It was a pleasure to see you today!  Dr. Loletha Carrow

## 2019-10-04 NOTE — Progress Notes (Signed)
[facility] Progress Note  Chief Complaint: Cirrhosis with large volume ascites  Subjective  History:  Kenneth [name] is here today with his son Kenneth [name], with whom he lives.  He has end-stage liver disease from former alcohol abuse, TIPS placement that initially worked well and then lost effect, at least in part from poor compliance with dietary sodium restriction.  He is on stable doses of diuretics, and has had slow increase in his ascites over the last few weeks.  Also reports scrotal swelling over that time as well.  Neither that nor the ascites are as pronounced as they have been at some points in the past.  He takes lactulose at least twice a day and rifaximin as well.  Kenneth [name] says that cutting the dose back of lactulose to twice a day generally worked well, but he did not have enough supply for days when his father seemed more confused or unsteady and needed additional doses. Sodium restriction has generally been followed, but Kenneth [name] is not home most of the day to completely control with Kenneth [name] eats.  ROS: Cardiovascular:  no chest pain Respiratory: no dyspnea, even with the ascites He has tremors, he is at times unsteady on his feet and needs to walk with a cane. Memory and quality of sleep are variable Chronic leg pain Remainder of systems negative except as above  The patient's Past Medical, Family and Social History were reviewed and are on file in the EMR.  Objective:  Med list reviewed  Current Outpatient Medications:  .  aMILoride (MIDAMOR) 5 MG tablet, Take 4 tablets a day for a total of 20 mg a day, Disp: 120 tablet, Rfl: 3 .  buPROPion (WELLBUTRIN SR) 100 MG 12 hr tablet, TAKE 1 TABLET BY MOUTH EVERY DAY, Disp: 60 tablet, Rfl: 3 .  diclofenac sodium (VOLTAREN) 1 % GEL, Apply 4 g topically 4 (four) times daily., Disp: 100 g, Rfl: 0 .  ergocalciferol (DRISDOL) 50000 units capsule, Take 1 capsule (50,000 Units total) by mouth once a week., Disp: 9 capsule, Rfl: 1 .  FLUoxetine  (PROZAC) 20 MG tablet, Take 1 tablet (20 mg total) by mouth daily., Disp: 30 tablet, Rfl: 3 .  FLUoxetine (PROZAC) 40 MG capsule, TAKE 1 CAPSULE BY MOUTH EVERY DAY, Disp: 30 capsule, Rfl: 2 .  furosemide (LASIX) 80 MG tablet, Take 1 tablet (80 mg total) by mouth daily., Disp: 30 tablet, Rfl: 3 .  gabapentin (NEURONTIN) 300 MG capsule, Take 2 capsules (600 mg total) by mouth at bedtime., Disp: 60 capsule, Rfl: 3 .  lactulose (CHRONULAC) 10 GM/15ML solution, Take 45 mLs (30 g total) by mouth 4 (four) times daily., Disp: 1892 mL, Rfl: 6 .  midodrine (PROAMATINE) 5 MG tablet, Take 1 tablet (5 mg total) by mouth 3 (three) times daily. 9a, 2p, 9p pt has been taking a whole tablet 3 times a day, Disp: 90 tablet, Rfl: 3 .  Multiple Vitamin (MULTIVITAMIN WITH MINERALS) TABS tablet, Take 1 tablet by mouth daily., Disp: 30 tablet, Rfl: 0 .  ondansetron (ZOFRAN) 4 MG tablet, Take 1 tablet (4 mg total) by mouth every 8 (eight) hours as needed for nausea or vomiting., Disp: 60 tablet, Rfl: 3 .  potassium chloride SA (KLOR-CON M20) 20 MEQ tablet, Take 2 tablets (40 mEq total) by mouth daily., Disp: 180 tablet, Rfl: 1 .  rifaximin (XIFAXAN) 550 MG TABS tablet, Take 1 tablet (550 mg total) by mouth 2 (two) times daily., Disp: 60 tablet, Rfl: 6  Vital signs in last 24 hrs: Vitals:   10/04/19 1051  BP: 108/64  Pulse: 70  Temp: (!) 97.3 F (36.3 C)    Physical Exam  Poor muscle mass, chronically ill-appearing.  Somewhat unsteady on his feet, uses cane, gets on exam table with assistance.(Usually brings a walker but did not have it today).  Flattened affect  HEENT: sclera anicteric, oral mucosa moist without lesions  Neck: supple, no thyromegaly, JVD or lymphadenopathy  Cardiac: RRR without murmurs, S1S2 heard, mild bilateral pretibial edema  Pulm: clear to auscultation bilaterally, normal RR and effort noted  Abdomen: soft, no tenderness, with active bowel sounds. No guarding or palpable  hepatosplenomegaly.  Distended with ascites, but not tense  Skin; warm and dry, no jaundice or rash  Recent Labs:  BMP Latest Ref Rng & Units 08/29/2019 07/29/2019 05/19/2019  Glucose 65 - 99 mg/dL 99 85 77  BUN 6 - 24 mg/dL 8 7 6   Creatinine 0.76 - 1.27 mg/dL 0.94 0.99 0.75  BUN/Creat Ratio 9 - 20 9 - -  Sodium 134 - 144 mmol/L 138 138 138  Potassium 3.5 - 5.2 mmol/L 4.0 5.2 No hemolysis seen(H) 4.6  Chloride 96 - 106 mmol/L 100 104 108  CO2 20 - 29 mmol/L 27 29 26   Calcium 8.7 - 10.2 mg/dL 8.5(L) 9.1 8.4    CBC Latest Ref Rng & Units 03/25/2019 12/07/2018 10/02/2018  WBC 3.4 - 10.8 x10E3/uL 7.5 5.2 5.6  Hemoglobin 13.0 - 17.7 g/dL 13.3 11.8(L) 10.6(L)  Hematocrit 37.5 - 51.0 % 36.2(L) 34.4(L) 33.8(L)  Platelets 150 - 450 x10E3/uL 80(LL) 82.0(L) 74(L)   Last INR 1.03 May 2019  Recent AFP nml  No ascites for paracentesis June 2020  @ASSESSMENTPLANBEGIN @ Assessment: Encounter Diagnoses  Name Primary?  . Alcoholic cirrhosis of liver with ascites ([place]) Yes  . Hepatic encephalopathy ([place])   . Esophageal varices without bleeding, unspecified esophageal varices type ([place])   . Ascites due to alcoholic cirrhosis ([place])   . Thrombocytopenia ([place])   . Confusion    Kenneth [name] has been generally stable since I last saw him.  His volume status fluctuates, and is somewhat higher these days. Hepatic encephalopathy under reasonably good control.  Kenneth [name] has definitely benefited from his sons close attention.  Nevertheless, he is always at risk for decompensation.  His primary care provider referred them to palliative care, but Kenneth [name] tells me that hospice staff actually came out to see him and did not think he was appropriate for hospice.  He is still waiting to hear back from the palliative care agency, I recommended he contact them directly or call his primary care provider to get reconnected.  I agree that chronic care management/palliative care is totally appropriate for Kenneth [name].  Up-to-date on hepatoma  screening  Plan: No changes to diuretics at this time.  His blood pressure runs relatively low, his kidneys have demonstrated sensitivity in the past.  Therapeutic paracentesis next week or the week after to relieve some of the ascites.  I increased the dose of lactulose so Kenneth [name] would have some flexibility with the dosing depending upon how the encephalopathy is on any given day.  Follow-up in 3 months or sooner as needed.  Total time 30 minutes, over half spent face-to-face with patient in counseling and coordination of care.   Nelida Meuse III

## 2019-10-10 ENCOUNTER — Ambulatory Visit (HOSPITAL_COMMUNITY)
Admission: RE | Admit: 2019-10-10 | Discharge: 2019-10-10 | Disposition: A | Discharge status: Home / Self Care | Payer: Medicaid Other | Source: Ambulatory Visit | Attending: Gastroenterology | Admitting: Gastroenterology

## 2019-10-10 ENCOUNTER — Other Ambulatory Visit: Payer: Self-pay | Admitting: Gastroenterology

## 2019-10-10 ENCOUNTER — Other Ambulatory Visit: Payer: Self-pay

## 2019-10-10 DIAGNOSIS — R410 Disorientation, unspecified: Secondary | ICD-10-CM | POA: Diagnosis not present

## 2019-10-10 DIAGNOSIS — I8500 Esophageal varices without bleeding: Secondary | ICD-10-CM | POA: Diagnosis not present

## 2019-10-10 DIAGNOSIS — D696 Thrombocytopenia, unspecified: Secondary | ICD-10-CM | POA: Diagnosis not present

## 2019-10-10 DIAGNOSIS — K7682 Hepatic encephalopathy: Secondary | ICD-10-CM

## 2019-10-10 DIAGNOSIS — K7031 Alcoholic cirrhosis of liver with ascites: Secondary | ICD-10-CM

## 2019-10-10 DIAGNOSIS — K7290 Hepatic failure, unspecified without coma: Secondary | ICD-10-CM | POA: Diagnosis not present

## 2019-10-10 MED ORDER — LIDOCAINE HCL 1 % IJ SOLN
INTRAMUSCULAR | Status: AC
  Filled 2019-10-10: qty 20

## 2019-10-10 NOTE — Progress Notes (Signed)
Patient presents for therapeutic  paracentesis. US limited abdomen shows trace amount of peritoneal fluid noted  Insufficient to perform a safe paracentesis. Procedure not performed.  

## 2019-10-11 ENCOUNTER — Other Ambulatory Visit: Payer: Self-pay | Admitting: *Deleted

## 2019-10-11 DIAGNOSIS — K7031 Alcoholic cirrhosis of liver with ascites: Secondary | ICD-10-CM

## 2019-11-30 ENCOUNTER — Other Ambulatory Visit: Payer: Self-pay

## 2019-11-30 ENCOUNTER — Ambulatory Visit: Payer: Medicaid Other | Attending: Family Medicine | Admitting: Family Medicine

## 2020-01-10 ENCOUNTER — Other Ambulatory Visit: Payer: Self-pay | Admitting: Family Medicine

## 2020-01-10 ENCOUNTER — Other Ambulatory Visit: Payer: Self-pay | Admitting: Gastroenterology

## 2020-01-10 DIAGNOSIS — G6289 Other specified polyneuropathies: Secondary | ICD-10-CM

## 2020-01-13 ENCOUNTER — Ambulatory Visit (INDEPENDENT_AMBULATORY_CARE_PROVIDER_SITE_OTHER): Payer: Medicaid Other | Admitting: Gastroenterology

## 2020-01-13 ENCOUNTER — Encounter: Payer: Self-pay | Admitting: Gastroenterology

## 2020-01-13 ENCOUNTER — Other Ambulatory Visit (INDEPENDENT_AMBULATORY_CARE_PROVIDER_SITE_OTHER): Payer: Medicaid Other

## 2020-01-13 VITALS — BP 102/62 | HR 82 | Temp 98.2°F | Ht 67.0 in | Wt 153.0 lb

## 2020-01-13 DIAGNOSIS — I8500 Esophageal varices without bleeding: Secondary | ICD-10-CM

## 2020-01-13 DIAGNOSIS — K7290 Hepatic failure, unspecified without coma: Secondary | ICD-10-CM | POA: Diagnosis not present

## 2020-01-13 DIAGNOSIS — K7031 Alcoholic cirrhosis of liver with ascites: Secondary | ICD-10-CM

## 2020-01-13 DIAGNOSIS — D696 Thrombocytopenia, unspecified: Secondary | ICD-10-CM

## 2020-01-13 DIAGNOSIS — K7682 Hepatic encephalopathy: Secondary | ICD-10-CM

## 2020-01-13 LAB — PROTIME-INR
INR: 1.6 ratio — ABNORMAL HIGH (ref 0.8–1.0)
Prothrombin Time: 18.1 s — ABNORMAL HIGH (ref 9.6–13.1)

## 2020-01-13 LAB — COMPREHENSIVE METABOLIC PANEL
ALT: 54 U/L — ABNORMAL HIGH (ref 0–53)
AST: 78 U/L — ABNORMAL HIGH (ref 0–37)
Albumin: 2.4 g/dL — ABNORMAL LOW (ref 3.5–5.2)
Alkaline Phosphatase: 221 U/L — ABNORMAL HIGH (ref 39–117)
BUN: 9 mg/dL (ref 6–23)
CO2: 27 mEq/L (ref 19–32)
Calcium: 8.8 mg/dL (ref 8.4–10.5)
Chloride: 97 mEq/L (ref 96–112)
Creatinine, Ser: 1.04 mg/dL (ref 0.40–1.50)
GFR: 73.16 mL/min (ref >60.00)
Glucose, Bld: 81 mg/dL (ref 70–99)
Potassium: 4.1 mEq/L (ref 3.5–5.1)
Sodium: 129 mEq/L — ABNORMAL LOW (ref 135–145)
Total Bilirubin: 3.8 mg/dL — ABNORMAL HIGH (ref 0.2–1.2)
Total Protein: 6.5 g/dL (ref 6.0–8.3)

## 2020-01-13 LAB — CBC WITH DIFFERENTIAL/PLATELET
Basophils Absolute: 0 10*3/uL (ref 0.0–0.1)
Basophils Relative: 0.7 % (ref 0.0–3.0)
Eosinophils Absolute: 0.1 10*3/uL (ref 0.0–0.7)
Eosinophils Relative: 1.9 % (ref 0.0–5.0)
HCT: 37.6 % — ABNORMAL LOW (ref 39.0–52.0)
Hemoglobin: 12.9 g/dL — ABNORMAL LOW (ref 13.0–17.0)
Lymphocytes Relative: 16.8 % (ref 12.0–46.0)
Lymphs Abs: 1 10*3/uL (ref 0.7–4.0)
MCHC: 34.3 g/dL (ref 30.0–36.0)
MCV: 92.1 fl (ref 78.0–100.0)
Monocytes Absolute: 0.8 10*3/uL (ref 0.1–1.0)
Monocytes Relative: 13.7 % — ABNORMAL HIGH (ref 3.0–12.0)
Neutro Abs: 4.1 10*3/uL (ref 1.4–7.7)
Neutrophils Relative %: 66.9 % (ref 43.0–77.0)
Platelets: 94 10*3/uL — ABNORMAL LOW (ref 150.0–400.0)
RBC: 4.08 Mil/uL — ABNORMAL LOW (ref 4.22–5.81)
RDW: 16.8 % — ABNORMAL HIGH (ref 11.5–15.5)
WBC: 6.1 10*3/uL (ref 4.0–10.5)

## 2020-01-13 NOTE — Progress Notes (Signed)
[facility] Progress Note  Chief Complaint: Cirrhosis with ascites  Subjective  History: Kenneth [name] follows up for his cirrhosis and ascites, last office visit October 04, 2019 No changes made in diuretic dosing because his blood pressure runs low.  Lactulose refilled, sometimes his son Kenneth [name] needs to give him additional doses depending upon his father's mental status on a given day.  Therapeutic paracentesis was planned for 10/10/2019, but no significant ascites could be found amenable to paracentesis.  (3 L was removed in July 2020, and no ascites amenable to paracentesis was found on 04/11/2019)  Kenneth [name] was accompanied by his son Kenneth [name] today, who is concerned that his dad has had a functional decline in the last several months.  He seems to be more unsteady much of the time, now needs to use a cane but probably should be using a walker as well.  Like before, he will get dizzy easily carsick and at times will vomit and not want to eat much for the day.  His general weakness has worsened in the last several months.  He typically takes lactulose 1 or 2 times a day, which causes 2-3 BMs daily.  There are doing their best with sodium restriction, and it seems like the volume overload is under the best control it has been for quite a while.  Kenneth [name] continues to smoke.   ROS: Cardiovascular:  no chest pain Respiratory: no dyspnea Memory difficulty Unsteadiness He does not get lightheaded when he stands Sleeping well, but often during the day and up at night The patient's Past Medical, Family and Social History were reviewed and are on file in the EMR.  Objective:  Med list reviewed  Current Outpatient Medications:  .  aMILoride (MIDAMOR) 5 MG tablet, TAKE 4 TABLETS BY MOUTH EVERY DAY, Disp: 120 tablet, Rfl: 3 .  buPROPion (WELLBUTRIN SR) 100 MG 12 hr tablet, TAKE 1 TABLET BY MOUTH EVERY DAY, Disp: 60 tablet, Rfl: 3 .  diclofenac sodium (VOLTAREN) 1 % GEL, Apply 4 g topically 4 (four) times  daily., Disp: 100 g, Rfl: 0 .  ergocalciferol (DRISDOL) 50000 units capsule, Take 1 capsule (50,000 Units total) by mouth once a week., Disp: 9 capsule, Rfl: 1 .  FLUoxetine (PROZAC) 20 MG tablet, Take 1 tablet (20 mg total) by mouth daily., Disp: 30 tablet, Rfl: 3 .  FLUoxetine (PROZAC) 40 MG capsule, TAKE 1 CAPSULE BY MOUTH EVERY DAY, Disp: 30 capsule, Rfl: 2 .  furosemide (LASIX) 80 MG tablet, TAKE 1 TABLET BY MOUTH EVERY DAY, Disp: 30 tablet, Rfl: 3 .  gabapentin (NEURONTIN) 300 MG capsule, TAKE 2 CAPSULES (600 MG TOTAL) BY MOUTH AT BEDTIME., Disp: 60 capsule, Rfl: 3 .  lactulose (CHRONULAC) 10 GM/15ML solution, Take 45 mLs (30 g total) by mouth 4 (four) times daily., Disp: 1892 mL, Rfl: 6 .  midodrine (PROAMATINE) 5 MG tablet, Take 1 tablet (5 mg total) by mouth 3 (three) times daily. 9a, 2p, 9p pt has been taking a whole tablet 3 times a day, Disp: 90 tablet, Rfl: 3 .  Multiple Vitamin (MULTIVITAMIN WITH MINERALS) TABS tablet, Take 1 tablet by mouth daily., Disp: 30 tablet, Rfl: 0 .  ondansetron (ZOFRAN) 4 MG tablet, Take 1 tablet (4 mg total) by mouth every 8 (eight) hours as needed for nausea or vomiting., Disp: 60 tablet, Rfl: 3 .  potassium chloride SA (KLOR-CON M20) 20 MEQ tablet, Take 2 tablets (40 mEq total) by mouth daily., Disp: 180 tablet, Rfl: 1 .  rifaximin (XIFAXAN) 550 MG TABS tablet, Take 1 tablet (550 mg total) by mouth 2 (two) times daily., Disp: 60 tablet, Rfl: 6   Vital signs in last 24 hrs: Vitals:   01/13/20 1342  BP: 102/62  Pulse: 82  Temp: 98.2 F (36.8 C)    Physical Exam  He has had a noticeable decline since I last saw him.  He is more unsteady, his muscle mass is decreased, he appears distracted.  HEENT: sclera anicteric, oral mucosa moist without lesions  Neck: supple, no thyromegaly, JVD or lymphadenopathy  Cardiac: RRR without murmurs, S1S2 heard, no peripheral edema  Pulm: clear to auscultation bilaterally, normal RR and effort noted  Abdomen:  soft, no tenderness, with active bowel sounds.  Left lobe liver enlarged, no spleen tip palpable.  Abdomen nondistended, really less ascites than I have previously found on exam.  Skin; warm and dry, sallow complexion and faintly jaundiced as before  Resting tremor, no asterixis. Labs:  No recent data ___________________________________________ Radiologic studies:  Ultrasound report from 09-22-2019 as noted above ____________________________________________ Other:   _____________________________________________ Assessment & Plan  Assessment: Encounter Diagnoses  Name Primary?  . Alcoholic cirrhosis of liver with ascites ([place]) Yes  . Hepatic encephalopathy ([place])   . Esophageal varices without bleeding, unspecified esophageal varices type ([place])   . Thrombocytopenia ([place])     End-stage liver disease from prior alcohol abuse.  Nonbleeding esophageal varices, portal hypertension with thrombocytopenia, ascites without peripheral edema, relative hypotension requiring midodrine and hepatic encephalopathy.  He has had a slow steady decline, and a noticeable decrease in his functional status in the last several months. His son Kenneth [name] expressed concern about his father's safety while he and his wife are working during the day.  A hospice/palliative care agency had apparently seen him once last fall, but he did not hear from them again.  I recommended evaluation by another palliative care service here in town, folks who have seen many of our patients before and I think they give quality care.  Kenneth [name] was definitely agreeable to that.  He even expressed some concern that his father would need some help at home or perhaps even consideration of long-term care.  He is reluctant to admit that, but clearly has caregiver fatigue and also concerns about the poor quality of the nursing facility that Kenneth [name] had previously been at.  Plan: CBC, CMP, INR and alpha-fetoprotein today I spoke with a referral  individual at a local palliative care agency, and they will set up a home visit. Kenneth [name] will see me in 2 to 3 months or sooner as needed.   30 minutes were spent on this encounter (including chart review, history/exam, counseling/coordination of care, and documentation)  Nelida Meuse III

## 2020-01-13 NOTE — Patient Instructions (Addendum)
If you are age 59 or older, your body mass index should be between 23-30. Your Body mass index is 23.96 kg/m. If this is out of the aforementioned range listed, please consider follow up with your Primary Care Provider.  If you are age 70 or younger, your body mass index should be between 19-25. Your Body mass index is 23.96 kg/m. If this is out of the aformentioned range listed, please consider follow up with your Primary Care Provider.   Your provider has requested that you go to the basement level for lab work before leaving today. Press "B" on the elevator. The lab is located at the first door on the left as you exit the elevator.  It was a pleasure to see you today!  Dr. Myrtie Neither

## 2020-01-16 ENCOUNTER — Telehealth: Payer: Self-pay | Admitting: Internal Medicine

## 2020-01-16 LAB — AFP TUMOR MARKER: AFP-Tumor Marker: 1.9 ng/mL (ref <6.1)

## 2020-01-20 ENCOUNTER — Other Ambulatory Visit: Payer: Self-pay

## 2020-01-20 ENCOUNTER — Other Ambulatory Visit: Payer: Medicaid Other | Admitting: Internal Medicine

## 2020-01-20 DIAGNOSIS — Z7189 Other specified counseling: Secondary | ICD-10-CM

## 2020-01-20 DIAGNOSIS — Z515 Encounter for palliative care: Secondary | ICD-10-CM

## 2020-01-20 NOTE — Progress Notes (Signed)
March 25th, 2021 [facility] Palliative Care Consult Note Telephone: [phone]  Fax: [phone]  PATIENT NAME: Kenneth [name] DOB: 08/22/1961 MRN: [id number] [address], [place] [phone] [facility] call to make sure next script.  [facility]   PRIMARY CARE PROVIDER:   [name], MD [address] [place],  [place] [address]   REFERRING PROVIDER:  Dr. Charlie [name] ([facility])  RESPONSIBLE PARTY: (son) Kenneth [name] [phone]. (ex-wife) [name] [phone]  ASSESSMENT / RECOMMENDATIONS:  1. Advance Care Planning: A. Directives: In the presence of patient's son Kenneth [name], discussed CPR in the event of cardiopulmonary arrest. Patient states at this time he would wish full resuscitate efforts. I did mention that with patient's underlying debilitation and disease, the likelihood of him surviving CPR or, if he survived achieving any level of quality of life, would be slim to none.  B. Goals of Care:  Son Kenneth [name] openly discussed that should patient need personal care, that he Kenneth [name]) would be unable to do so in the home; would wish transfer for facility care. We discussed scope of Hospice services. Patient was wary as he saw this as the final step towards end of life. Kenneth [name] is open to evaluation, but only if Hospice would continue to cover patient's Lactulose and rifaximin. I willl look into that.  Patient was asking about his long-term prognosis. Using recent (01/13/20) lab results (create 1.04, Bilirubin 3.8, INR 1.6, Na 129); patient's MELD-NA is 23, suggesting a 3 month mortality of 19.6%.   2. Symptom Management: A. Confusion likely 2/2/Hepatic Encephalopathy/hyponatremia: Patient has markedly reduced short term memory. Unable to remember what he had for breakfast. Thought he had resourced his computer yesterday to view his "my chart"; actually, hasn't used his computer for at least 6 months. Continues Lactulose  cup  bid to tid (family sometimes runs out early so they try to stretch it out), and rifaximin 550mg  qd. Fluid restriction of less than 2 L. Tries to follow low Na diet. B. H/O ascites with past multi parenthesis. Ascites resolved after TIPS mid early 2020. Bilat LE edema pitting to upper calf.  C. Pruritus; over all body:  -consider Questran; will check with GI. D. Peripheral neuropathy: Painful feet improved with gabapentin. He ran out of med over the last few weeks so his feet hurt a bit today, but he just restarted. E. Hypotension: on Midodrine tid with meals. F. Nausea for much of the day, with episodic emesis as often as 3-4 times a day. Vomited onto ground outside just before I arrived. He then munched through an apple and felt better. Has Zofran available; felt not that effective. 3. Cognitive / Functional status: PPS 50%. Ongoing tobacco use; no ETOH for > 5 years. Very sweet and affable affect. Most often confused. Awake a bit at night but naps a lot during the day. Oriented to self, place. Very forgetful. Can't remember what he was going to do min to min. Ambulates with unsteady gait/off balance; needs direction and cueing. Loses his balance when bends over. 2021 about 1 week ago into a pile of clothing, and about a month ago on the front steps. No injuries. Marked LE shakiness with ambulation. Poor safety awareness but doesn't wander away for the home/mess with stove. Needs cueing for proper use of cane (often forgets to grab), stand by assist for steadying guidance. Has steep front concrete steps that he needs some careful guidance, cuing, and hands on  assist to maneuver. He is independent in transfers, dressing, personal hygiene, and toileting. Needs assist to bathe. Independent in toileting. Wears depends d/t frequent small amounts of urinary dribbling. Continent of bowels; moves bowels qd to qod. Current weight 153lbs. At a height of 5'7" his BMI is 24kg/m2.   -Consider PT/OT consult for family  education in use of/evaluation for assistive devices for patient (shower chair, needs to transition to walker or rollator with seat, proper technique up/down stairs). I'll call [facility] office to request.  -Recommended to Kenneth [name] that he place a sliding lock onto top of bottom of door so that patient doesn't attempt to go outside unaided.    -Melatonin 3mg  qhs. 4. Family Supports: Lives in home of (only child) son Kenneth [name] and patient's ex wife Kenneth [name] (who works at [employer]). Patient was at a rehab facility early last year but family felt care suboptimal and brought patient home. 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 Patient is unsafe to be left alone. Patient has [place] consult guidance for additional community resources. He will soon need custodial care/placement, as son Kenneth [name] feels unable to provide personal physical care for patient. I'll see if PCP can order SW consult, piggybacked onto order for PT/OT consult.  5. Palliative Care F/U: Tue 4/13/ 2021 @2pm  I spent 60 minutes providing this consultation from 2pm to 3pm. More than 50% of the time in this consultation was spent coordinating communication.   HISTORY OF PRESENT ILLNESS:  Kenneth [name] is a 59 y.o. male with alcoholic cirrhosis of the liver with ascites, portal HTN (ascites resolved s/p TIPS last year), hepatic encephalopathy, esophageal varices (without bleed), hypotension (midodrine) and thrombocytopenia.   Palliative Care was asked to help address goals of care.   CODE STATUS: Full Code  HOSPICE ELIGIBILITY/DIAGNOSIS: Yes/end stage liver disease  PAST MEDICAL HISTORY:  Past Medical History:  Diagnosis Date  . Alcohol abuse   . Anxiety   . Arthritis    "never diagnosis" (09/02/2018)  . Ascites   . Blood clot in vein   . Cirrhosis ([place])   . Depression   . Dyspnea    Due to ascites  . Esophageal varices ([place]) 09/02/2014  . H/O [facility] spotted fever    age 20  or 84  . Hernia, inguinal    right  . Hiatal hernia 09/02/2014  . Liver cirrhosis ([place])   . Polysubstance abuse ([place])   . Portal hypertension with esophageal varices ([facility]) 09/02/2014  . Portal hypertensive gastropathy ([place]) 09/02/2014  . Thrombocytopenia ([place]) 09/02/2014  . Upper GI bleed 08/31/2014    SOCIAL HX:  Social History   Tobacco Use  . Smoking status: Current Every Day Smoker    Packs/day: 1.00    Years: 43.00    Pack years: 43.00    Types: Cigarettes  . Smokeless tobacco: Never Used  Substance Use Topics  . Alcohol use: No    Comment: 09/02/2018 "no alcohol since 01/23/16"    ALLERGIES:  Allergies  Allergen Reactions  . Latex Swelling and Rash     PERTINENT MEDICATIONS:  Outpatient Encounter Medications as of 01/20/2020  Medication Sig  . aMILoride (MIDAMOR) 5 MG tablet TAKE 4 TABLETS BY MOUTH EVERY DAY  . buPROPion (WELLBUTRIN SR) 100 MG 12 hr tablet TAKE 1 TABLET BY MOUTH EVERY DAY  . diclofenac sodium (VOLTAREN) 1 % GEL Apply 4 g topically 4 (four) times daily.  . ergocalciferol (DRISDOL) 50000 units capsule Take  1 capsule (50,000 Units total) by mouth once a week.  Marland Kitchen FLUoxetine (PROZAC) 20 MG tablet Take 1 tablet (20 mg total) by mouth daily.  Marland Kitchen FLUoxetine (PROZAC) 40 MG capsule TAKE 1 CAPSULE BY MOUTH EVERY DAY  . furosemide (LASIX) 80 MG tablet TAKE 1 TABLET BY MOUTH EVERY DAY  . gabapentin (NEURONTIN) 300 MG capsule TAKE 2 CAPSULES (600 MG TOTAL) BY MOUTH AT BEDTIME.  Marland Kitchen lactulose (CHRONULAC) 10 GM/15ML solution Take 45 mLs (30 g total) by mouth 4 (four) times daily.  . midodrine (PROAMATINE) 5 MG tablet Take 1 tablet (5 mg total) by mouth 3 (three) times daily. 9a, 2p, 9p pt has been taking a whole tablet 3 times a day  . Multiple Vitamin (MULTIVITAMIN WITH MINERALS) TABS tablet Take 1 tablet by mouth daily.  . ondansetron (ZOFRAN) 4 MG tablet Take 1 tablet (4 mg total) by mouth every 8 (eight) hours as needed for nausea or vomiting.  . potassium chloride SA  (KLOR-CON M20) 20 MEQ tablet Take 2 tablets (40 mEq total) by mouth daily.  . rifaximin (XIFAXAN) 550 MG TABS tablet Take 1 tablet (550 mg total) by mouth 2 (two) times daily.   No facility-administered encounter medications on file as of 01/20/2020.    PHYSICAL EXAM:   General: NAD, frail appearing, thin. Son and ex-wife in attendance. Cardiovascular: regular rate and rhythm Pulmonary: clear ant fields Abdomen: soft, nontender, soft Extremities: Bilat LE edema/pitting to upper calf, no joint deformities. Upper extremity muscular / adipose wasting. Skin: no rashes; skin dry. Neurological: Weakness and LE shakiness with ambulation. Very poor short-term memory.  Anselm Lis, NP

## 2020-01-21 ENCOUNTER — Encounter: Payer: Self-pay | Admitting: Internal Medicine

## 2020-01-25 ENCOUNTER — Telehealth: Payer: Self-pay | Admitting: Family Medicine

## 2020-01-25 DIAGNOSIS — K7031 Alcoholic cirrhosis of liver with ascites: Secondary | ICD-10-CM

## 2020-01-25 NOTE — Telephone Encounter (Signed)
Kenneth [name] with Palliative care would like orders for PT Ot and social work.  She states that orders has to be put in she can not take verbal orders.

## 2020-01-25 NOTE — Telephone Encounter (Signed)
Referral placed.

## 2020-01-25 NOTE — Telephone Encounter (Signed)
palliative care called asking for verbal order ot, pt, social worker

## 2020-01-26 ENCOUNTER — Telehealth: Payer: Self-pay

## 2020-01-26 NOTE — Telephone Encounter (Signed)
Referral received for home health therapy.  Call placed to patient's son, Kenneth [name] who said that they do not have a preference for home health agencies.    Referral faxed to [facility]. Message sent to Dr Alvis Lemmings noting that patient may need face to face encounter

## 2020-01-30 ENCOUNTER — Telehealth: Payer: Self-pay

## 2020-01-30 NOTE — Telephone Encounter (Signed)
Message received from [facility] stating that they are not able to accept the referral.

## 2020-02-01 ENCOUNTER — Telehealth: Payer: Self-pay

## 2020-02-01 NOTE — Telephone Encounter (Signed)
Call placed to patient's son, Kenneth [name], to schedule face to face encounter for home health services.  Appointment scheduled for 02/14/2020.  Kenneth [name] prefers in person visit in lieu of virtual visit.  Kenneth [name] then said that his father has a hard lump under his right pectoral muscle, noting it is round about the size of an orange, not protruding/visable.  It said that it needs to be palpated. No discoloration of the skin.  No recent falls where he hit his chest. Does not affect his breathing at rest but when asked to take a deep breath, his father says the area hurts. Informed him that Dr Alvis Lemmings would be notified of this concern.  Please advise

## 2020-02-02 NOTE — Telephone Encounter (Signed)
I will address at his visit.

## 2020-02-02 NOTE — Telephone Encounter (Signed)
Call placed to patient's son, Kenneth [name] and informed him that Dr Alvis Lemmings will address his concern about his father at his visit - 02/14/2020.  Instructed Kenneth [name] to call the clinic if he has any questions/concerns in the meantime and he said he understood

## 2020-02-07 ENCOUNTER — Other Ambulatory Visit: Payer: Self-pay

## 2020-02-07 ENCOUNTER — Other Ambulatory Visit: Payer: Medicaid Other | Admitting: Internal Medicine

## 2020-02-07 DIAGNOSIS — Z7189 Other specified counseling: Secondary | ICD-10-CM

## 2020-02-07 DIAGNOSIS — Z515 Encounter for palliative care: Secondary | ICD-10-CM

## 2020-02-07 NOTE — Progress Notes (Signed)
April 13th, 2021 [facility] Palliative Care Consult Note Telephone: [phone]  Fax: [phone]   PATIENT NAME: Kenneth [name] DOB: 1961-09-13 MRN: [id number] [address], [place] [phone] [facility] call to make sure next script.  [facility]    PRIMARY CARE PROVIDER:   [name], MD [address] [place],  [place] [address]  In person visit 4/20. Give him a "head up" Kenneth [name]. Enjoys yogert. Apples/oranges, protein sandwich. Mostly emesis.    REFERRING PROVIDER:  Dr. Charlie [name] ([facility])   RESPONSIBLE PARTY: (son) Kenneth [name] [phone]. (ex-wife) Kenneth [name] [facility] [phone]   ASSESSMENT / RECOMMENDATIONS:  1. Advance Care Planning: A. Directives: Patient states at this time he would wish full resuscitate efforts. I did mention that with patient's underlying debilitation and disease, the likelihood of him surviving CPR or, if he survived achieving any level of quality of life, would be slim to none.  B. Goals of Care: Son actively seeking facility placement, as he feels unable to provide the progressive personal care that patient will need, with the expected decline of his disease. he has Medicaid. Kenneth [name] is hoping for a facility in the [facility] area. Patient is aware and is in agreement.  2. Symptom Management: A. Continues confusion 2/2/Hepatic Encephalopathy. He is taking even longer to process his thoughts then 4 weeks earlier. May start to do a little project (to go outside), stand up and just stay there for minutes, then forget what he was going to do. Needs verbal cuine to transfer and ambulate. Continues markedly reduced short term memory. He is able to take his Lactulose and rifaximin 550mg  qd. Fluid restriction of less than 2 L. Tries to follow low Na diet.   B. H/O ascites with past multi parenthesis. Ascites resolved after TIPS mid early 2020. He has less bilat LE edema, now to  just mid-calf. I'm thinking this is due to a decreased urine output.   C. Pruritus; over all body: now resolved.             -consider [facility]; will check with GI. D. Peripheral neuropathy: markedly improved with restarting his Neurontin  E. Hypotension: on Midodrine bid-tid with meals. Has occasional dizziness. SPB today 104.   F. Nausea much of the day; emesis few times a day. Able to tolerate yogurt. Has prn Zofran but the nausea often comes on so fast he doesn't have a chance to take this antiemetic before he vomits.   3. Cognitive / Functional status: PPS now 40%. Ongoing tobacco use down to a few cigarettes/day; no ETOH for > 5 years. Very sweet and affable affect. Oriented to self, place. Very forgetful. Can't remember what he was going to do min to min. Ambulates with unsteady gait/off balance; needs direction and cueing. Loses his balance when bends over. Marked LE shakiness with ambulation (progressed). Frequent falls; last about 4 days ago when attempting to put on his shoes. Poor safety awareness but doesn't wander away for the home. One day son came home and patient had all the stove burners on, so son removed the dials. No longer allowing patient to keep any lighters. Needs cueing for proper use of cane (often forgets to grab), stand by assist for steadying guidance. Has steep front concrete steps that he needs some careful guidance, cuing, and hands on assist to maneuver. He is needing more assist with transfers, dressing, and personal hygiene. Continues independent with toileting. Needs  assist to bathe. Independent in toileting. Wears depends d/t frequent small amounts of urinary dribbling. Continent of bowels; moves bowels qd to qod. No recent weight, but weight about 1 month ago was 153lbs. At a height of 5'7" his BMI was 24kg/m2.   4. Family Supports: Lives in home of (only child) son Kenneth [name] and patient's ex wife Kenneth [name] (who works at [employer]). Patient was at a rehab  facility early last year but family felt care suboptimal and brought patient home. 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 Patient is unsafe to be left alone. Patient has Medicaid/diasbility.   5. Palliative Care F/U: Tue 03/19/2020 @3pm   I spent 60 minutes providing this consultation from 2pm to 3pm. More than 50% of the time in this consultation was spent coordinating communication.    HISTORY OF PRESENT ILLNESS:  Kenneth [name] is a 59 y.o. male with alcoholic cirrhosis of the liver with ascites, portal HTN (ascites resolved s/p TIPS last year), hepatic encephalopathy, esophageal varices (without bleed), hypotension (midodrine) and thrombocytopenia.    This is a f/u Palliative Care visit from 01/20/2020.  CODE STATUS: Full Code   HOSPICE ELIGIBILITY/DIAGNOSIS: Yes/end stage liver disease PAST MEDICAL HISTORY:  Past Medical History:  Diagnosis Date  . Alcohol abuse   . Anxiety   . Arthritis    "never diagnosis" (09/02/2018)  . Ascites   . Blood clot in vein   . Cirrhosis ([place])   . Depression   . Dyspnea    Due to ascites  . Esophageal varices ([place]) 09/02/2014  . H/O [facility] spotted fever    age 55 or 35  . Hernia, inguinal    right  . Hiatal hernia 09/02/2014  . Liver cirrhosis ([place])   . Polysubstance abuse ([place])   . Portal hypertension with esophageal varices (H[facility]) 09/02/2014  . Portal hypertensive gastropathy ([place]) 09/02/2014  . Thrombocytopenia ([place]) 09/02/2014  . Upper GI bleed 08/31/2014    SOCIAL HX:  Social History   Tobacco Use  . Smoking status: Current Every Day Smoker    Packs/day: 1.00    Years: 43.00    Pack years: 43.00    Types: Cigarettes  . Smokeless tobacco: Never Used  Substance Use Topics  . Alcohol use: No    Comment: 09/02/2018 "no alcohol since 01/23/16"    ALLERGIES:  Allergies  Allergen Reactions  . Latex Swelling and Rash     PERTINENT MEDICATIONS:  Outpatient Encounter Medications as of 02/07/2020    Medication Sig  . aMILoride (MIDAMOR) 5 MG tablet TAKE 4 TABLETS BY MOUTH EVERY DAY  . buPROPion (WELLBUTRIN SR) 100 MG 12 hr tablet TAKE 1 TABLET BY MOUTH EVERY DAY  . diclofenac sodium (VOLTAREN) 1 % GEL Apply 4 g topically 4 (four) times daily.  . ergocalciferol (DRISDOL) 50000 units capsule Take 1 capsule (50,000 Units total) by mouth once a week.  Marland Kitchen FLUoxetine (PROZAC) 40 MG capsule TAKE 1 CAPSULE BY MOUTH EVERY DAY  . furosemide (LASIX) 80 MG tablet TAKE 1 TABLET BY MOUTH EVERY DAY  . gabapentin (NEURONTIN) 300 MG capsule TAKE 2 CAPSULES (600 MG TOTAL) BY MOUTH AT BEDTIME.  Marland Kitchen lactulose (CHRONULAC) 10 GM/15ML solution Take 45 mLs (30 g total) by mouth 4 (four) times daily.  . midodrine (PROAMATINE) 5 MG tablet Take 1 tablet (5 mg total) by mouth 3 (three) times daily. 9a, 2p, 9p pt has been taking a whole tablet 3 times a day  . Multiple Vitamin (  MULTIVITAMIN WITH MINERALS) TABS tablet Take 1 tablet by mouth daily.  . ondansetron (ZOFRAN) 4 MG tablet Take 1 tablet (4 mg total) by mouth every 8 (eight) hours as needed for nausea or vomiting.  . potassium chloride SA (KLOR-CON M20) 20 MEQ tablet Take 2 tablets (40 mEq total) by mouth daily.  . rifaximin (XIFAXAN) 550 MG TABS tablet Take 1 tablet (550 mg total) by mouth 2 (two) times daily.   No facility-administered encounter medications on file as of 02/07/2020.    PHYSICAL EXAM:   General: NAD, frail appearing, thin. Slow thought process. Affable. Son in attendance. Cardiovascular: regular rate and rhythm Pulmonary: clear ant fields Abdomen: soft, nontender, soft Extremities: Bilat LE edema/mildly pitting to upper calf, no joint deformities. Upper extremity muscular / adipose wasting. Skin: no rashes; skin dry. Neurological: Weakness and LE shakiness with ambulation. Very poor short-term memory.  Anselm Lis, NP

## 2020-02-08 ENCOUNTER — Telehealth: Payer: Self-pay | Admitting: Internal Medicine

## 2020-02-08 ENCOUNTER — Encounter: Payer: Self-pay | Admitting: Internal Medicine

## 2020-02-08 NOTE — Telephone Encounter (Signed)
Telephone call to patient son [name], regarding plans for facility placement. I passed on to [place] that Medicaid accepting facilities included [facility] and [facility] [place]. I advised him to call those facilities to check for bed availability and if they are accepting of Medicaid. Thereafter he would need to contact his primary care physician for an [place] 2. He'll keep me updated. Pallliative Care follow up scheduled for Monday May 24th. Holly Bodily NP - C  [phone]

## 2020-02-10 ENCOUNTER — Encounter (HOSPITAL_COMMUNITY): Payer: Self-pay

## 2020-02-10 ENCOUNTER — Inpatient Hospital Stay (HOSPITAL_COMMUNITY)
Admission: EM | Admit: 2020-02-10 | Discharge: 2020-02-14 | DRG: 433 | Disposition: A | Discharge status: Skilled Nursing Facility | Payer: Medicaid Other | Attending: Internal Medicine | Admitting: Internal Medicine

## 2020-02-10 DIAGNOSIS — D6959 Other secondary thrombocytopenia: Secondary | ICD-10-CM | POA: Diagnosis present

## 2020-02-10 DIAGNOSIS — K7290 Hepatic failure, unspecified without coma: Secondary | ICD-10-CM

## 2020-02-10 DIAGNOSIS — B957 Other staphylococcus as the cause of diseases classified elsewhere: Secondary | ICD-10-CM | POA: Diagnosis present

## 2020-02-10 DIAGNOSIS — R296 Repeated falls: Secondary | ICD-10-CM

## 2020-02-10 DIAGNOSIS — F17210 Nicotine dependence, cigarettes, uncomplicated: Secondary | ICD-10-CM | POA: Diagnosis present

## 2020-02-10 DIAGNOSIS — Z20822 Contact with and (suspected) exposure to covid-19: Secondary | ICD-10-CM | POA: Diagnosis present

## 2020-02-10 DIAGNOSIS — K7030 Alcoholic cirrhosis of liver without ascites: Secondary | ICD-10-CM

## 2020-02-10 DIAGNOSIS — K7682 Hepatic encephalopathy: Secondary | ICD-10-CM

## 2020-02-10 DIAGNOSIS — K766 Portal hypertension: Secondary | ICD-10-CM | POA: Diagnosis present

## 2020-02-10 DIAGNOSIS — N390 Urinary tract infection, site not specified: Secondary | ICD-10-CM | POA: Diagnosis present

## 2020-02-10 DIAGNOSIS — K7200 Acute and subacute hepatic failure without coma: Secondary | ICD-10-CM | POA: Diagnosis present

## 2020-02-10 DIAGNOSIS — E876 Hypokalemia: Secondary | ICD-10-CM | POA: Diagnosis present

## 2020-02-10 DIAGNOSIS — F329 Major depressive disorder, single episode, unspecified: Secondary | ICD-10-CM | POA: Diagnosis present

## 2020-02-10 DIAGNOSIS — Z1623 Resistance to quinolones and fluoroquinolones: Secondary | ICD-10-CM | POA: Diagnosis present

## 2020-02-10 DIAGNOSIS — D696 Thrombocytopenia, unspecified: Secondary | ICD-10-CM | POA: Diagnosis present

## 2020-02-10 DIAGNOSIS — K7040 Alcoholic hepatic failure without coma: Principal | ICD-10-CM | POA: Diagnosis present

## 2020-02-10 DIAGNOSIS — R112 Nausea with vomiting, unspecified: Secondary | ICD-10-CM

## 2020-02-10 DIAGNOSIS — K7031 Alcoholic cirrhosis of liver with ascites: Secondary | ICD-10-CM | POA: Diagnosis present

## 2020-02-10 DIAGNOSIS — Z79899 Other long term (current) drug therapy: Secondary | ICD-10-CM

## 2020-02-10 LAB — HEPATIC FUNCTION PANEL
ALT: 73 U/L — ABNORMAL HIGH (ref 0–44)
AST: 112 U/L — ABNORMAL HIGH (ref 15–41)
Albumin: 2.3 g/dL — ABNORMAL LOW (ref 3.5–5.0)
Alkaline Phosphatase: 245 U/L — ABNORMAL HIGH (ref 38–126)
Bilirubin, Direct: 1.6 mg/dL — ABNORMAL HIGH (ref 0.0–0.2)
Indirect Bilirubin: 3.9 mg/dL — ABNORMAL HIGH (ref 0.3–0.9)
Total Bilirubin: 5.5 mg/dL — ABNORMAL HIGH (ref 0.3–1.2)
Total Protein: 6.4 g/dL — ABNORMAL LOW (ref 6.5–8.1)

## 2020-02-10 LAB — LIPASE, BLOOD: Lipase: 69 U/L — ABNORMAL HIGH (ref 11–51)

## 2020-02-10 LAB — COMPREHENSIVE METABOLIC PANEL
ALT: 73 U/L — ABNORMAL HIGH (ref 0–44)
AST: 113 U/L — ABNORMAL HIGH (ref 15–41)
Albumin: 2.4 g/dL — ABNORMAL LOW (ref 3.5–5.0)
Alkaline Phosphatase: 239 U/L — ABNORMAL HIGH (ref 38–126)
Anion gap: 10 (ref 5–15)
BUN: 14 mg/dL (ref 6–20)
CO2: 27 mmol/L (ref 22–32)
Calcium: 8.3 mg/dL — ABNORMAL LOW (ref 8.9–10.3)
Chloride: 94 mmol/L — ABNORMAL LOW (ref 98–111)
Creatinine, Ser: 1.2 mg/dL (ref 0.61–1.24)
GFR calc Af Amer: >60 mL/min (ref >60)
GFR calc non Af Amer: >60 mL/min (ref >60)
Glucose, Bld: 103 mg/dL — ABNORMAL HIGH (ref 70–99)
Potassium: 3.2 mmol/L — ABNORMAL LOW (ref 3.5–5.1)
Sodium: 131 mmol/L — ABNORMAL LOW (ref 135–145)
Total Bilirubin: 5.4 mg/dL — ABNORMAL HIGH (ref 0.3–1.2)
Total Protein: 6.6 g/dL (ref 6.5–8.1)

## 2020-02-10 LAB — CBC
HCT: 39.9 % (ref 39.0–52.0)
Hemoglobin: 13.3 g/dL (ref 13.0–17.0)
MCH: 31 pg (ref 26.0–34.0)
MCHC: 33.3 g/dL (ref 30.0–36.0)
MCV: 93 fL (ref 80.0–100.0)
Platelets: 85 10*3/uL — ABNORMAL LOW (ref 150–400)
RBC: 4.29 MIL/uL (ref 4.22–5.81)
RDW: 16.2 % — ABNORMAL HIGH (ref 11.5–15.5)
WBC: 7.8 10*3/uL (ref 4.0–10.5)
nRBC: 0 % (ref 0.0–0.2)

## 2020-02-10 NOTE — ED Triage Notes (Signed)
Pt jaundiced. Has cirrhosis. Called ems for nausea and 2 episodes of vomiting.Took 2, 4mg  zofran pta with no help.

## 2020-02-11 ENCOUNTER — Emergency Department (HOSPITAL_COMMUNITY): Payer: Medicaid Other

## 2020-02-11 ENCOUNTER — Other Ambulatory Visit: Payer: Self-pay

## 2020-02-11 DIAGNOSIS — K7290 Hepatic failure, unspecified without coma: Secondary | ICD-10-CM

## 2020-02-11 DIAGNOSIS — K7031 Alcoholic cirrhosis of liver with ascites: Secondary | ICD-10-CM | POA: Diagnosis not present

## 2020-02-11 DIAGNOSIS — D696 Thrombocytopenia, unspecified: Secondary | ICD-10-CM

## 2020-02-11 DIAGNOSIS — R112 Nausea with vomiting, unspecified: Secondary | ICD-10-CM | POA: Diagnosis not present

## 2020-02-11 DIAGNOSIS — K7030 Alcoholic cirrhosis of liver without ascites: Secondary | ICD-10-CM

## 2020-02-11 DIAGNOSIS — K7200 Acute and subacute hepatic failure without coma: Secondary | ICD-10-CM | POA: Diagnosis not present

## 2020-02-11 DIAGNOSIS — R296 Repeated falls: Secondary | ICD-10-CM

## 2020-02-11 DIAGNOSIS — K7682 Hepatic encephalopathy: Secondary | ICD-10-CM | POA: Diagnosis present

## 2020-02-11 LAB — COMPREHENSIVE METABOLIC PANEL
ALT: 57 U/L — ABNORMAL HIGH (ref 0–44)
AST: 108 U/L — ABNORMAL HIGH (ref 15–41)
Albumin: 2 g/dL — ABNORMAL LOW (ref 3.5–5.0)
Alkaline Phosphatase: 194 U/L — ABNORMAL HIGH (ref 38–126)
Anion gap: 9 (ref 5–15)
BUN: 13 mg/dL (ref 6–20)
CO2: 24 mmol/L (ref 22–32)
Calcium: 7.7 mg/dL — ABNORMAL LOW (ref 8.9–10.3)
Chloride: 98 mmol/L (ref 98–111)
Creatinine, Ser: 0.89 mg/dL (ref 0.61–1.24)
GFR calc Af Amer: >60 mL/min (ref >60)
GFR calc non Af Amer: >60 mL/min (ref >60)
Glucose, Bld: 112 mg/dL — ABNORMAL HIGH (ref 70–99)
Potassium: 4 mmol/L (ref 3.5–5.1)
Sodium: 131 mmol/L — ABNORMAL LOW (ref 135–145)
Total Bilirubin: 5 mg/dL — ABNORMAL HIGH (ref 0.3–1.2)
Total Protein: 5.3 g/dL — ABNORMAL LOW (ref 6.5–8.1)

## 2020-02-11 LAB — CBC WITH DIFFERENTIAL/PLATELET
Abs Immature Granulocytes: 0.09 10*3/uL — ABNORMAL HIGH (ref 0.00–0.07)
Basophils Absolute: 0 10*3/uL (ref 0.0–0.1)
Basophils Relative: 1 %
Eosinophils Absolute: 0.1 10*3/uL (ref 0.0–0.5)
Eosinophils Relative: 2 %
HCT: 35.5 % — ABNORMAL LOW (ref 39.0–52.0)
Hemoglobin: 11.8 g/dL — ABNORMAL LOW (ref 13.0–17.0)
Immature Granulocytes: 1 %
Lymphocytes Relative: 11 %
Lymphs Abs: 0.9 10*3/uL (ref 0.7–4.0)
MCH: 30.4 pg (ref 26.0–34.0)
MCHC: 33.2 g/dL (ref 30.0–36.0)
MCV: 91.5 fL (ref 80.0–100.0)
Monocytes Absolute: 1.1 10*3/uL — ABNORMAL HIGH (ref 0.1–1.0)
Monocytes Relative: 14 %
Neutro Abs: 5.8 10*3/uL (ref 1.7–7.7)
Neutrophils Relative %: 71 %
Platelets: 72 10*3/uL — ABNORMAL LOW (ref 150–400)
RBC: 3.88 MIL/uL — ABNORMAL LOW (ref 4.22–5.81)
RDW: 16.2 % — ABNORMAL HIGH (ref 11.5–15.5)
WBC: 8.1 10*3/uL (ref 4.0–10.5)
nRBC: 0 % (ref 0.0–0.2)

## 2020-02-11 LAB — URINALYSIS, ROUTINE W REFLEX MICROSCOPIC
Bilirubin Urine: NEGATIVE
Glucose, UA: NEGATIVE mg/dL
Hgb urine dipstick: NEGATIVE
Ketones, ur: 5 mg/dL — AB
Nitrite: NEGATIVE
Protein, ur: NEGATIVE mg/dL
Specific Gravity, Urine: 1.009 (ref 1.005–1.030)
WBC, UA: >50 WBC/hpf — ABNORMAL HIGH (ref 0–5)
pH: 8 (ref 5.0–8.0)

## 2020-02-11 LAB — AMMONIA
Ammonia: 56 umol/L — ABNORMAL HIGH (ref 9–35)
Ammonia: 35 umol/L (ref 9–35)

## 2020-02-11 LAB — HIV ANTIBODY (ROUTINE TESTING W REFLEX): HIV Screen 4th Generation wRfx: NONREACTIVE

## 2020-02-11 LAB — SARS CORONAVIRUS 2 (TAT 6-24 HRS): SARS Coronavirus 2: NEGATIVE

## 2020-02-11 LAB — MAGNESIUM: Magnesium: 1.5 mg/dL — ABNORMAL LOW (ref 1.7–2.4)

## 2020-02-11 MED ORDER — POTASSIUM CHLORIDE CRYS ER 20 MEQ PO TBCR
40.0000 meq | EXTENDED_RELEASE_TABLET | Freq: Once | ORAL | Status: AC
  Administered 2020-02-11: 40 meq via ORAL
  Filled 2020-02-11: qty 2

## 2020-02-11 MED ORDER — SODIUM CHLORIDE 0.9 % IV BOLUS
1000.0000 mL | Freq: Once | INTRAVENOUS | Status: AC
  Administered 2020-02-11: 1000 mL via INTRAVENOUS

## 2020-02-11 MED ORDER — SODIUM CHLORIDE 0.9 % IV SOLN
1.0000 g | Freq: Once | INTRAVENOUS | Status: AC
  Administered 2020-02-11: 1 g via INTRAVENOUS
  Filled 2020-02-11: qty 10

## 2020-02-11 MED ORDER — MIDODRINE HCL 5 MG PO TABS
5.0000 mg | ORAL_TABLET | ORAL | Status: DC
  Administered 2020-02-11 – 2020-02-14 (×10): 5 mg via ORAL
  Filled 2020-02-11 (×11): qty 1

## 2020-02-11 MED ORDER — ONDANSETRON HCL 4 MG PO TABS
4.0000 mg | ORAL_TABLET | Freq: Four times a day (QID) | ORAL | Status: DC | PRN
  Administered 2020-02-11 – 2020-02-14 (×2): 4 mg via ORAL
  Filled 2020-02-11 (×2): qty 1

## 2020-02-11 MED ORDER — ONDANSETRON HCL 4 MG/2ML IJ SOLN
4.0000 mg | Freq: Four times a day (QID) | INTRAMUSCULAR | Status: DC | PRN
  Administered 2020-02-11: 4 mg via INTRAVENOUS
  Filled 2020-02-11: qty 2

## 2020-02-11 MED ORDER — SODIUM CHLORIDE 0.9 % IV SOLN
1.0000 g | INTRAVENOUS | Status: DC
  Administered 2020-02-12 – 2020-02-13 (×2): 1 g via INTRAVENOUS
  Filled 2020-02-11 (×2): qty 1

## 2020-02-11 MED ORDER — LACTULOSE 10 GM/15ML PO SOLN
30.0000 g | Freq: Four times a day (QID) | ORAL | Status: DC
  Administered 2020-02-11 – 2020-02-14 (×13): 30 g via ORAL
  Filled 2020-02-11 (×13): qty 45

## 2020-02-11 MED ORDER — RIFAXIMIN 550 MG PO TABS
550.0000 mg | ORAL_TABLET | Freq: Two times a day (BID) | ORAL | Status: DC
  Administered 2020-02-11 – 2020-02-14 (×8): 550 mg via ORAL
  Filled 2020-02-11 (×8): qty 1

## 2020-02-11 MED ORDER — MAGNESIUM SULFATE 2 GM/50ML IV SOLN
2.0000 g | Freq: Once | INTRAVENOUS | Status: AC
  Administered 2020-02-11: 2 g via INTRAVENOUS
  Filled 2020-02-11: qty 50

## 2020-02-11 MED ORDER — FLUOXETINE HCL 20 MG PO CAPS
40.0000 mg | ORAL_CAPSULE | Freq: Every day | ORAL | Status: DC
  Administered 2020-02-11 – 2020-02-14 (×4): 40 mg via ORAL
  Filled 2020-02-11 (×4): qty 2

## 2020-02-11 MED ORDER — GABAPENTIN 300 MG PO CAPS
600.0000 mg | ORAL_CAPSULE | Freq: Every day | ORAL | Status: DC
  Administered 2020-02-11 – 2020-02-14 (×4): 600 mg via ORAL
  Filled 2020-02-11 (×4): qty 2

## 2020-02-11 MED ORDER — ONDANSETRON HCL 4 MG PO TABS: 4.0000 mg | ORAL_TABLET | Freq: Three times a day (TID) | ORAL | Status: DC | PRN

## 2020-02-11 MED ORDER — AMILORIDE HCL 5 MG PO TABS
20.0000 mg | ORAL_TABLET | Freq: Every day | ORAL | Status: DC
  Administered 2020-02-11 – 2020-02-14 (×4): 20 mg via ORAL
  Filled 2020-02-11 (×4): qty 4

## 2020-02-11 MED ORDER — LACTULOSE 10 GM/15ML PO SOLN
20.0000 g | Freq: Once | ORAL | Status: AC
  Administered 2020-02-11: 20 g via ORAL
  Filled 2020-02-11: qty 30

## 2020-02-11 MED ORDER — BUPROPION HCL ER (SR) 100 MG PO TB12
100.0000 mg | ORAL_TABLET | Freq: Every day | ORAL | Status: DC
  Administered 2020-02-11 – 2020-02-14 (×4): 100 mg via ORAL
  Filled 2020-02-11 (×4): qty 1

## 2020-02-11 MED ORDER — ONDANSETRON HCL 4 MG/2ML IJ SOLN
4.0000 mg | Freq: Once | INTRAMUSCULAR | Status: AC
  Administered 2020-02-11: 4 mg via INTRAVENOUS
  Filled 2020-02-11: qty 2

## 2020-02-11 MED ORDER — LACTULOSE ENEMA
300.0000 mL | Freq: Once | ORAL | Status: DC
  Filled 2020-02-11: qty 300

## 2020-02-11 NOTE — ED Notes (Signed)
ED TO INPATIENT HANDOFF REPORT  ED Nurse Name and Phone #: [phone]  S Name/Age/Gender Kenneth [name] 59 y.o. male Room/Bed: WA16/WA16  Code Status   Code Status: Full Code  Home/SNF/Other Home Patient oriented to: self and place I s this baseline? Yes  --- Triage Complete: Triage complete  Chief Complaint Acute hepatic encephalopathy [K72.00]  Triage Note Pt jaundiced. Has cirrhosis. Called ems for nausea and 2 episodes of vomiting.Took 2, 4mg  zofran pta with no help.     Allergies Allergies  Allergen Reactions  . Latex Swelling and Rash    Level of Care/Admitting Diagnosis ED Disposition    ED Disposition Condition Comment   Admit  [facility] [[id number]]  Level of Care: Med-Surg [16]  Covid Evaluation: Asymptomatic Screening Protocol (No Symptoms)  Diagnosis: Acute hepatic encephalopathy [454098][[id number]]  Admitting Physician: Hillary BowGARDNER, JARED M [phone][[id number]]  Attending Physician: Hillary BowGARDNER, JARED M [phone][[id number]]       B Medical/Surgery History Past Medical History:  Diagnosis Date  . Alcohol abuse   . Anxiety   . Arthritis    "never diagnosis" (09/02/2018)  . Ascites   . Blood clot in vein   . Cirrhosis (HCC)   . Depression   . Dyspnea    Due to ascites  . Esophageal varices (HCC) 09/02/2014  . [facility]    age 59 or 89  . Hernia, inguinal    right  . Hiatal hernia 09/02/2014  . Liver cirrhosis (HCC)   . Polysubstance abuse (HCC)   . Portal hypertension with esophageal varices (HCC) 09/02/2014  . Portal hypertensive gastropathy (HCC) 09/02/2014  . Thrombocytopenia (HCC) 09/02/2014  . Upper GI bleed 08/31/2014   Past Surgical History:  Procedure Laterality Date  . ESOPHAGOGASTRODUODENOSCOPY Left 09/01/2014   Procedure: ESOPHAGOGASTRODUODENOSCOPY (EGD);  Surgeon: Charna ElizabethJyothi Mann, MD;  Location: [facility];  Service: Endoscopy;  Laterality: Left;  . EYE MUSCLE SURGERY Left 1967   "lazy eye"  . IR PARACENTESIS   03/16/2018  . IR PARACENTESIS  03/29/2018  . IR PARACENTESIS  04/19/2018  . IR PARACENTESIS  04/27/2018  . IR PARACENTESIS  05/10/2018  . IR PARACENTESIS  05/19/2018  . IR PARACENTESIS  06/11/2018  . IR PARACENTESIS  06/29/2018  . IR PARACENTESIS  07/16/2018  . IR PARACENTESIS  08/03/2018  . IR PARACENTESIS  08/23/2018  . IR PARACENTESIS  09/17/2018  . IR PARACENTESIS  10/19/2018  . IR PARACENTESIS  10/29/2018  . IR PARACENTESIS  11/05/2018  . IR PARACENTESIS  11/12/2018  . IR PARACENTESIS  11/19/2018  . IR PARACENTESIS  12/23/2018  . IR PARACENTESIS  05/20/2019  . IR RADIOLOGIST EVAL & MGMT  05/13/2018  . IR RADIOLOGIST EVAL & MGMT  06/29/2018  . IR RADIOLOGIST EVAL & MGMT  12/28/2018  . IR RADIOLOGIST EVAL & MGMT  08/03/2019  . IR TIPS  05/19/2018  . RADIOLOGY WITH ANESTHESIA N/A 05/19/2018   Procedure: TIPS;  Surgeon: Simonne ComeWatts, John, MD;  [facility];  Service: Radiology;  Laterality: N/A;     A IV Location/Drains/Wounds Patient Lines/Drains/Airways Status   Active Line/Drains/Airways    Name:   Placement date:   Placement time:   Site:   Days:   Peripheral IV 02/11/20 Left Wrist   02/11/20    0245    Wrist   less than 1   External Urinary Catheter   02/11/20    0121    --   less than 1   Incision (  Closed) 05/19/18 Neck Right   05/19/18    1102     633   Incision (Closed) 05/19/18 Abdomen Right   05/19/18    1103     633          Intake/Output Last 24 hours No intake or output data in the 24 hours ending 02/11/20 0820  Labs/Imaging Results for orders placed or performed during the hospital encounter of 02/10/20 (from the past 48 hour(s))  Lipase, blood     Status: Abnormal   Collection Time: 02/10/20 11:03 PM  Result Value Ref Range   Lipase 69 (H) 11 - 51 U/L    Comment: Performed at [facility], [address] [place], [place] [address]  Comprehensive metabolic panel     Status: Abnormal   Collection Time: 02/10/20 11:03 PM  Result Value Ref Range   Sodium 131  (L) 135 - 145 mmol/L   Potassium 3.2 (L) 3.5 - 5.1 mmol/L   Chloride 94 (L) 98 - 111 mmol/L   CO2 27 22 - 32 mmol/L   Glucose, Bld 103 (H) 70 - 99 mg/dL    Comment: Glucose reference range applies only to samples taken after fasting for at least 8 hours.   BUN 14 6 - 20 mg/dL   Creatinine, Ser 3.81 0.61 - 1.24 mg/dL   Calcium 8.3 (L) 8.9 - 10.3 mg/dL   Total Protein 6.6 6.5 - 8.1 g/dL   Albumin 2.4 (L) 3.5 - 5.0 g/dL   AST 829 (H) 15 - 41 U/L   ALT 73 (H) 0 - 44 U/L   Alkaline Phosphatase 239 (H) 38 - 126 U/L   Total Bilirubin 5.4 (H) 0.3 - 1.2 mg/dL   GFR calc non Af Amer >60 >60 mL/min   GFR calc Af Amer >60 >60 mL/min   Anion gap 10 5 - 15    Comment: Performed at [facility], [address], [place], [place] [address]  CBC     Status: Abnormal   Collection Time: 02/10/20 11:03 PM  Result Value Ref Range   WBC 7.8 4.0 - 10.5 K/uL   RBC 4.29 4.22 - 5.81 MIL/uL   Hemoglobin 13.3 13.0 - 17.0 g/dL   HCT 96.7 89.3 - 81.0 %   MCV 93.0 80.0 - 100.0 fL   MCH 31.0 26.0 - 34.0 pg   MCHC 33.3 30.0 - 36.0 g/dL   RDW 17.5 (H) 10.2 - 58.5 %   Platelets 85 (L) 150 - 400 K/uL    Comment: SPECIMEN CHECKED FOR CLOTS Immature Platelet Fraction may be clinically indicated, consider ordering this additional test [id number] PLATELET COUNT CONFIRMED BY SMEAR REPEATED TO VERIFY    nRBC 0.0 0.0 - 0.2 %    Comment: Performed at [facility], [address], [place], [place] [address]  Hepatic function panel     Status: Abnormal   Collection Time: 02/10/20 11:03 PM  Result Value Ref Range   Total Protein 6.4 (L) 6.5 - 8.1 g/dL   Albumin 2.3 (L) 3.5 - 5.0 g/dL   AST 431 (H) 15 - 41 U/L   ALT 73 (H) 0 - 44 U/L   Alkaline Phosphatase 245 (H) 38 - 126 U/L   Total Bilirubin 5.5 (H) 0.3 - 1.2 mg/dL   Bilirubin, Direct 1.6 (H) 0.0 - 0.2 mg/dL   Indirect Bilirubin 3.9 (H) 0.3 - 0.9 mg/dL    Comment: Performed at [facility], [address] [place], [place] [address]  Urinalysis, Routine w reflex microscopic     Status: Abnormal   Collection Time: 02/11/20 12:47 AM  Result Value Ref Range   Color, Urine YELLOW YELLOW   APPearance CLOUDY (A) CLEAR   Specific Gravity, Urine 1.009 1.005 - 1.030   pH 8.0 5.0 - 8.0   Glucose, UA NEGATIVE NEGATIVE mg/dL   Hgb urine dipstick NEGATIVE NEGATIVE   Bilirubin Urine NEGATIVE NEGATIVE   Ketones, ur 5 (A) NEGATIVE mg/dL   Protein, ur NEGATIVE NEGATIVE mg/dL   Nitrite NEGATIVE NEGATIVE   Leukocytes,Ua LARGE (A) NEGATIVE   RBC / HPF 6-10 0 - 5 RBC/hpf   WBC, UA >50 (H) 0 - 5 WBC/hpf   Bacteria, UA RARE (A) NONE SEEN   WBC Clumps PRESENT     Comment: Performed at [facility], [address], [place], [place] [address]  SARS CORONAVIRUS 2 (TAT 6-24 HRS) Nasopharyngeal Nasopharyngeal Swab     Status: None   Collection Time: 02/11/20 12:59 AM   Specimen: Nasopharyngeal Swab  Result Value Ref Range   SARS Coronavirus 2 NEGATIVE NEGATIVE    Comment: (NOTE) SARS-CoV-2 target nucleic acids are NOT DETECTED. The SARS-CoV-2 RNA is generally detectable in upper and lower respiratory specimens during the acute phase of infection. Negative results do not preclude SARS-CoV-2 infection, do not rule out co-infections with other pathogens, and should not be used as the sole basis for treatment or other patient management decisions. Negative results must be combined with clinical observations, patient history, and epidemiological information. The expected result is Negative. Fact Sheet for Patients: HairSlick.no Fact Sheet for Healthcare Providers: quierodirigir.com This test is not yet approved or cleared by the [place] FDA and  has been authorized for detection and/or diagnosis of SARS-CoV-2 by FDA under an Emergency Use Authorization (EUA). This EUA will remain  in effect (meaning this test can be used) for the  duration of the COVID-19 declaration under Section 56 4(b)(1) of the Act, 21 U.S.C. section 360bbb-3(b)(1), unless the authorization is terminated or revoked sooner. Performed at [facility], [address] [place], [place] [address]   Ammonia     Status: Abnormal   Collection Time: 02/11/20  1:12 AM  Result Value Ref Range   Ammonia 56 (H) 9 - 35 umol/L    Comment: MODERATE HEMOLYSIS Performed at [facility], [address] [place], [place] [address]   Magnesium     Status: Abnormal   Collection Time: 02/11/20  1:12 AM  Result Value Ref Range   Magnesium 1.5 (L) 1.7 - 2.4 mg/dL    Comment: Performed at [facility], [address], [place], [place] [address]  CBC WITH DIFFERENTIAL     Status: Abnormal   Collection Time: 02/11/20  6:25 AM  Result Value Ref Range   WBC 8.1 4.0 - 10.5 K/uL   RBC 3.88 (L) 4.22 - 5.81 MIL/uL   Hemoglobin 11.8 (L) 13.0 - 17.0 g/dL   HCT 54.6 (L) 27.0 - 35.0 %   MCV 91.5 80.0 - 100.0 fL   MCH 30.4 26.0 - 34.0 pg   MCHC 33.2 30.0 - 36.0 g/dL   RDW 09.3 (H) 81.8 - 29.9 %   Platelets 72 (L) 150 - 400 K/uL    Comment: SPECIMEN CHECKED FOR CLOTS Immature Platelet Fraction may be clinically indicated, consider ordering this additional test [id number] PLATELET COUNT CONFIRMED BY SMEAR REPEATED TO VERIFY    nRBC 0.0  0.0 - 0.2 %   Neutrophils Relative % 71 %   Neutro Abs 5.8 1.7 - 7.7 K/uL   Lymphocytes Relative 11 %   Lymphs Abs 0.9 0.7 - 4.0 K/uL   Monocytes Relative 14 %   Monocytes Absolute 1.1 (H) 0.1 - 1.0 K/uL   Eosinophils Relative 2 %   Eosinophils Absolute 0.1 0.0 - 0.5 K/uL   Basophils Relative 1 %   Basophils Absolute 0.0 0.0 - 0.1 K/uL   Immature Granulocytes 1 %   Abs Immature Granulocytes 0.09 (H) 0.00 - 0.07 K/uL    Comment: Performed at [facility], [place] [address] [place], [place] [address]  Comprehensive metabolic panel     Status: Abnormal   Collection Time:  02/11/20  6:25 AM  Result Value Ref Range   Sodium 131 (L) 135 - 145 mmol/L   Potassium 4.0 3.5 - 5.1 mmol/L    Comment: DELTA CHECK NOTED NO VISIBLE HEMOLYSIS    Chloride 98 98 - 111 mmol/L   CO2 24 22 - 32 mmol/L   Glucose, Bld 112 (H) 70 - 99 mg/dL    Comment: Glucose reference range applies only to samples taken after fasting for at least 8 hours.   BUN 13 6 - 20 mg/dL   Creatinine, Ser 0.89 0.61 - 1.24 mg/dL   Calcium 7.7 (L) 8.9 - 10.3 mg/dL   Total Protein 5.3 (L) 6.5 - 8.1 g/dL   Albumin 2.0 (L) 3.5 - 5.0 g/dL   AST 108 (H) 15 - 41 U/L   ALT 57 (H) 0 - 44 U/L   Alkaline Phosphatase 194 (H) 38 - 126 U/L   Total Bilirubin 5.0 (H) 0.3 - 1.2 mg/dL   GFR calc non Af Amer >60 >60 mL/min   GFR calc Af Amer >60 >60 mL/min   Anion gap 9 5 - 15    Comment: Performed at [facility], [place] [address] [place], [place] [address]  Ammonia     Status: None   Collection Time: 02/11/20  6:25 AM  Result Value Ref Range   Ammonia 35 9 - 35 umol/L    Comment: Performed at [facility], [place] [address], [place], [place] [address]   No results found.  Pending Labs Unresulted Labs (From admission, onward)    Start     Ordered   02/11/20 0551  HIV Antibody (routine testing w rflx)  (HIV Antibody (Routine testing w reflex) panel)  Once,   STAT     02/11/20 0558   02/11/20 0214  Urine culture  ONCE - STAT,   STAT     02/11/20 0215          Vitals/Pain Today's Vitals   02/11/20 0545 02/11/20 0554 02/11/20 0600 02/11/20 0819  BP: 122/67  122/72   Pulse: 77 77 77   Resp: 18  17   Temp:      TempSrc:      SpO2:  100% 99%   PainSc:    0-No pain    Isolation Precautions No active isolations  Medications Medications  ondansetron (ZOFRAN) tablet 4 mg (has no administration in time range)    Or  ondansetron (ZOFRAN) injection 4 mg (has no administration in time range)  midodrine (PROAMATINE) tablet 5 mg (has no administration in time range)   aMILoride (MIDAMOR) tablet 20 mg (has no administration in time range)  buPROPion (WELLBUTRIN SR) 12 hr tablet 100 mg (has no administration in time  range)  FLUoxetine (PROZAC) capsule 40 mg (has no administration in time range)  gabapentin (NEURONTIN) capsule 600 mg (has no administration in time range)  lactulose (CHRONULAC) 10 GM/15ML solution 30 g (has no administration in time range)  ondansetron (ZOFRAN) tablet 4 mg (has no administration in time range)  rifaximin (XIFAXAN) tablet 550 mg (has no administration in time range)  sodium chloride 0.9 % bolus 1,000 mL (0 mLs Intravenous Stopped 02/11/20 0246)  ondansetron (ZOFRAN) injection 4 mg (4 mg Intravenous Given 02/11/20 0058)  lactulose (CHRONULAC) 10 GM/15ML solution 20 g (20 g Oral Given 02/11/20 0247)  magnesium sulfate IVPB 2 g 50 mL (0 g Intravenous Stopped 02/11/20 0353)  potassium chloride SA (KLOR-CON) CR tablet 40 mEq (40 mEq Oral Given 02/11/20 0247)  cefTRIAXone (ROCEPHIN) 1 g in sodium chloride 0.9 % 100 mL IVPB (0 g Intravenous Stopped 02/11/20 0331)    Mobility walks Moderate fall risk   Focused Assessments .   R Recommendations: See Admitting Provider Note  Report given to:   Additional Notes: n/a

## 2020-02-11 NOTE — ED Notes (Signed)
Pt. Documented in error see above note in chart. 

## 2020-02-11 NOTE — H&P (Signed)
History and Physical    Kenneth [name]:[id number] DOB: 1961-05-08 DOA: 02/10/2020  PCP: [name], MD  Patient coming from: Home  I have personally briefly reviewed patient's old medical records in [facility]  Chief Complaint: AMS  HPI: Kenneth [name] is a 59 y.o. male with medical history significant of ESLD on hospice from EtOH cirrhosis of liver.  Chronic thrombocytopenia, esophageal varices.  Pt still a full code as of hospice note just a couple of days ago.  Pt still resides at home.  Pt presents to the ED with c/o 6 episodes of NBNB emesis since yesterday morning.  Unable to take home med including lactulose and rifaximin.  Worsening confusion per son throughout day.  He reports that as the patient's confusion has been worsening that he is concerned that the patient needs a higher level of care than what he and his mother are able to provide for him at home. Both he and his mother works full-time. The patient has had an increasing number of falls over the last few weeks. He has also started to wonder more through the house, which include several flights of stairs.  Initially having abd pain apparently (though pain resolved by the time Im seeing him in ED).   ED Course: K 3.2 (replaced), Mg 1.5 (replaced).  Ammonia of 56, T bili 5.5, platelets 85 (chronic).   Review of Systems: Denies abd pain, ROS not really trustworthy due to AMS  Past Medical History:  Diagnosis Date  . Alcohol abuse   . Anxiety   . Arthritis    "never diagnosis" (09/02/2018)  . Ascites   . Blood clot in vein   . Cirrhosis (HCC)   . Depression   . Dyspnea    Due to ascites  . Esophageal varices (HCC) 09/02/2014  . H/O [facility] spotted fever    age 22 or 2  . Hernia, inguinal    right  . Hiatal hernia 09/02/2014  . Liver cirrhosis (HCC)   . Polysubstance abuse (HCC)   . Portal hypertension with esophageal varices (HCC) 09/02/2014  . Portal hypertensive gastropathy (HCC)  09/02/2014  . Thrombocytopenia (HCC) 09/02/2014  . Upper GI bleed 08/31/2014    Past Surgical History:  Procedure Laterality Date  . ESOPHAGOGASTRODUODENOSCOPY Left 09/01/2014   Procedure: ESOPHAGOGASTRODUODENOSCOPY (EGD);  Surgeon: Charna Elizabeth, MD;  Location: [facility] ENDOSCOPY;  Service: Endoscopy;  Laterality: Left;  . EYE MUSCLE SURGERY Left 1967   "lazy eye"  . IR PARACENTESIS  03/16/2018  . IR PARACENTESIS  03/29/2018  . IR PARACENTESIS  04/19/2018  . IR PARACENTESIS  04/27/2018  . IR PARACENTESIS  05/10/2018  . IR PARACENTESIS  05/19/2018  . IR PARACENTESIS  06/11/2018  . IR PARACENTESIS  06/29/2018  . IR PARACENTESIS  07/16/2018  . IR PARACENTESIS  08/03/2018  . IR PARACENTESIS  08/23/2018  . IR PARACENTESIS  09/17/2018  . IR PARACENTESIS  10/19/2018  . IR PARACENTESIS  10/29/2018  . IR PARACENTESIS  11/05/2018  . IR PARACENTESIS  11/12/2018  . IR PARACENTESIS  11/19/2018  . IR PARACENTESIS  12/23/2018  . IR PARACENTESIS  05/20/2019  . IR RADIOLOGIST EVAL & MGMT  05/13/2018  . IR RADIOLOGIST EVAL & MGMT  06/29/2018  . IR RADIOLOGIST EVAL & MGMT  12/28/2018  . IR RADIOLOGIST EVAL & MGMT  08/03/2019  . IR TIPS  05/19/2018  . RADIOLOGY WITH ANESTHESIA N/A 05/19/2018   Procedure: TIPS;  Surgeon: Simonne Come, MD;  Location: [facility]  OR;  Service: Radiology;  Laterality: N/A;     reports that he has been smoking cigarettes. He has a 43.00 pack-year smoking history. He has never used smokeless tobacco. He reports current drug use. Frequency: 5.00 times per week. Drug: Marijuana. He reports that he does not drink alcohol.  Allergies  Allergen Reactions  . Latex Swelling and Rash    Family History  Problem Relation Age of Onset  . Leukemia Father        acute myloid   . Hyperlipidemia Father   . Hypertension Father   . Colon cancer Neg Hx   . Esophageal cancer Neg Hx   . Pancreatic cancer Neg Hx   . Stomach cancer Neg Hx   . Liver disease Neg Hx      Prior to Admission medications   Medication Sig  Start Date End Date Taking? Authorizing Provider  aMILoride (MIDAMOR) 5 MG tablet TAKE 4 TABLETS BY MOUTH EVERY DAY 01/10/20  Yes Danis, Starr Lake III, MD  buPROPion (WELLBUTRIN SR) 100 MG 12 hr tablet TAKE 1 TABLET BY MOUTH EVERY DAY 09/29/19  Yes Newlin, Enobong, MD  FLUoxetine (PROZAC) 40 MG capsule TAKE 1 CAPSULE BY MOUTH EVERY DAY 01/12/20  Yes Newlin, Enobong, MD  furosemide (LASIX) 80 MG tablet TAKE 1 TABLET BY MOUTH EVERY DAY 01/10/20  Yes Danis, Starr Lake III, MD  gabapentin (NEURONTIN) 300 MG capsule TAKE 2 CAPSULES (600 MG TOTAL) BY MOUTH AT BEDTIME. 01/12/20  Yes Newlin, Enobong, MD  lactulose (CHRONULAC) 10 GM/15ML solution Take 45 mLs (30 g total) by mouth 4 (four) times daily. Patient taking differently: Take 30 g by mouth 4 (four) times daily as needed for moderate constipation.  10/04/19  Yes Danis, Andreas Blower, MD  midodrine (PROAMATINE) 5 MG tablet Take 1 tablet (5 mg total) by mouth 3 (three) times daily. 9a, 2p, 9p pt has been taking a whole tablet 3 times a day 08/29/19  Yes Danis, Starr Lake III, MD  ondansetron (ZOFRAN) 4 MG tablet Take 1 tablet (4 mg total) by mouth every 8 (eight) hours as needed for nausea or vomiting. 08/29/19  Yes Newlin, Odette Horns, MD  potassium chloride SA (KLOR-CON M20) 20 MEQ tablet Take 2 tablets (40 mEq total) by mouth daily. 08/29/19  Yes [name], MD  rifaximin (XIFAXAN) 550 MG TABS tablet Take 1 tablet (550 mg total) by mouth 2 (two) times daily. 06/29/19  Yes Sherrilyn Rist, MD    Physical Exam: Vitals:   02/11/20 0400 02/11/20 0545 02/11/20 0554 02/11/20 0600  BP: 120/68 122/67  122/72  Pulse: 75 77 77 77  Resp: 17 18  17   Temp:      TempSrc:      SpO2: 98%  100% 99%    Constitutional: NAD, calm, comfortable Eyes: PERRL, lids and conjunctivae normal ENMT: Mucous membranes are moist. Posterior pharynx clear of any exudate or lesions.Normal dentition.  Neck: normal, supple, no masses, no thyromegaly Respiratory: clear to auscultation  bilaterally, no wheezing, no crackles. Normal respiratory effort. No accessory muscle use.  Cardiovascular: Regular rate and rhythm, no murmurs / rubs / gallops. No extremity edema. 2+ pedal pulses. No carotid bruits.  Abdomen: no tenderness, no masses palpated. No hepatosplenomegaly. Bowel sounds positive.  Musculoskeletal: no clubbing / cyanosis. No joint deformity upper and lower extremities. Good ROM, no contractures. Normal muscle tone.  Skin: no rashes, lesions, ulcers. No induration Neurologic: CN 2-12 grossly intact. Sensation intact, DTR normal. Strength 5/5 in all  4.  Psychiatric: Confused: thinks year is 2018, president is Trump, and month is "12".  Oriented to location and self.   Labs on Admission: I have personally reviewed following labs and imaging studies  CBC: Recent Labs  Lab 02/10/20 2303  WBC 7.8  HGB 13.3  HCT 39.9  MCV 93.0  PLT 85*   Basic Metabolic Panel: Recent Labs  Lab 02/10/20 2303 02/11/20 0112  NA 131*  --   K 3.2*  --   CL 94*  --   CO2 27  --   GLUCOSE 103*  --   BUN 14  --   CREATININE 1.20  --   CALCIUM 8.3*  --   MG  --  1.5*   GFR: CrCl cannot be calculated (Unknown ideal weight.). Liver Function Tests: Recent Labs  Lab 02/10/20 2303  AST 112*  113*  ALT 73*  73*  ALKPHOS 245*  239*  BILITOT 5.5*  5.4*  PROT 6.4*  6.6  ALBUMIN 2.3*  2.4*   Recent Labs  Lab 02/10/20 2303  LIPASE 69*   Recent Labs  Lab 02/11/20 0112  AMMONIA 56*   Coagulation Profile: No results for input(s): INR, PROTIME in the last 168 hours. Cardiac Enzymes: No results for input(s): CKTOTAL, CKMB, CKMBINDEX, TROPONINI in the last 168 hours. BNP (last 3 results) No results for input(s): PROBNP in the last 8760 hours. HbA1C: No results for input(s): HGBA1C in the last 72 hours. CBG: No results for input(s): GLUCAP in the last 168 hours. Lipid Profile: No results for input(s): CHOL, HDL, LDLCALC, TRIG, CHOLHDL, LDLDIRECT in the last 72  hours. Thyroid Function Tests: No results for input(s): TSH, T4TOTAL, FREET4, T3FREE, THYROIDAB in the last 72 hours. Anemia Panel: No results for input(s): VITAMINB12, FOLATE, FERRITIN, TIBC, IRON, RETICCTPCT in the last 72 hours. Urine analysis:    Component Value Date/Time   COLORURINE YELLOW 02/11/2020 0047   APPEARANCEUR CLOUDY (A) 02/11/2020 0047   LABSPEC 1.009 02/11/2020 0047   PHURINE 8.0 02/11/2020 [place] 02/11/2020 0047   HGBUR NEGATIVE 02/11/2020 0047   BILIRUBINUR NEGATIVE 02/11/2020 0047   BILIRUBINUR negative 02/25/2018 1121   KETONESUR 5 (A) 02/11/2020 0047   PROTEINUR NEGATIVE 02/11/2020 0047   UROBILINOGEN 0.2 02/25/2018 1121   UROBILINOGEN 1.0 04/08/2009 1430   NITRITE NEGATIVE 02/11/2020 0047   LEUKOCYTESUR LARGE (A) 02/11/2020 0047    Radiological Exams on Admission: No results found.  EKG: Independently reviewed.  Assessment/Plan Principal Problem:   Hepatic encephalopathy (HCC) Active Problems:   Thrombocytopenia (HCC)   Alcoholic cirrhosis of liver with ascites (HCC)   Nausea & vomiting   Acute hepatic encephalopathy    1. Acute hepatic encephalopathy - 1. Tolerated lactulose in ED, no further N/V at this time nor abd pain. 2. Resume home lactulose and rifaxan 3. Repeat CBC/CMP and ammonia in AM 4. PT/OT eval 5. SW eval to see if he needs home health vs SNF 2. N/V - 1. No further vomiting in ED, tolerated POs thus far 2. No further abd pain in ED 1. Thus will defer CT for the moment 3. Will try him on heart healthy diet 4. Lasix on hold for the moment 5. zofran PRN 3. Thrombocytopenia - 1. Chronic and baseline  DVT prophylaxis: SCDs - thrombocytopenia, esophageal varices, elevated INR Code Status: Full code for the moment Family Communication: No family in room Disposition Plan: TBD Consults called: None Admission status: Place in [place], Emberlin Verner M. DO  [facility]  How to contact the [facility] Attending  or Consulting provider 7A - 7P or covering provider during after hours 7P -7A, for this patient?  1. Check the care team in [facility] and look for a) attending/consulting [facility] provider listed and b) the [facility] team listed 2. Log into [email or web address]  [employer] Physician Scheduling and messaging for groups and whole hospitals  On call and physician scheduling software for group practices, residents, hospitalists and other medical providers for call, clinic, rotation and shift schedules. On[employer] is a hospital-wide system for scheduling doctors and paging doctors on call. EasyPlot is for scientific plotting and data analysis.  [email or web address]  and use [facility] universal password to access. If you do not have the password, please contact the hospital operator.  3. Locate the [facility] provider you are looking for under [facility] and page to a number that you can be directly reached. 4. If you still have difficulty reaching the provider, please page the [facility] (Director on Call) for the Hospitalists listed on amion for assistance.  02/11/2020, 6:08 AM

## 2020-02-11 NOTE — ED Notes (Signed)
Accidentally validated a pulse of 233; pt's pulse was 75 palpated, the SpO2 sensor fell off in bed

## 2020-02-11 NOTE — Progress Notes (Addendum)
I have seen and assessed patient and agree with Dr. [place] Service assessment and plan.  Patient is a 59 year old gentleman history of end-stage liver disease from EtOH on hospice, chronic thrombocytopenia, esophageal varices who was admitted this morning after presenting with 6 episodes of nonbloody nonbilious emesis, inability to keep anything down including lactulose and Xifaxan with worsening confusion.  Patient's nausea and emesis improved.  Patient with some improvement with his confusion however only oriented to self and place.  Patient still with confusion however baseline unknown.  Patient also noted to have urinalysis worrisome for UTI.  Place on IV Rocephin.  It was noted per admitting physician that patient may need placement as family did state unable to take care of patient at home.  PT/OT pending.  Social work consulted.  No charge.

## 2020-02-11 NOTE — Progress Notes (Signed)
PT Cancellation Note  Patient Details Name: Kenneth [name] MRN: [id number] DOB: [birth year]   Cancelled Treatment:     PT order received but eval deferred - pt actively vomiting.  Will follow.Mauro Kaufmann PT Acute Rehabilitation Services Pager [phone] Office [phone]    [facility] 02/11/2020, 2:51 PM

## 2020-02-11 NOTE — ED Provider Notes (Signed)
[facility] DEPT Provider Note   CSN: [id number] Arrival date & time: 02/10/20  2151     History Chief Complaint  Patient presents with  . Nausea  . Abdominal Pain    Kenneth [name] is a 59 y.o. male with a history of alcohol use disorder who has been abstinent for >5 years, liver cirrhosis, portal hypertension with esophageal varices, portal hypertensive gastropathy, rocky mountain spotted fever who is accompanied to the emergency department by his son with a chief complaint of vomiting.  The patient has had approximately 6 NBNB episodes of vomiting since this morning. He has been unable to take his morning medications, including his lactulose and rifaximin.  He is also endorsing worsening pain in his right upper abdomen and right lower chest.  No recent fever or chills.  No cough, shortness of breath, diarrhea, or constipation.  No melena or hematochezia.  The patient's son is at bedside. He reports that as the patient's confusion has been worsening that he is concerned that the patient needs a higher level of care than what he and his mother are able to provide for him at home. Both he and his mother works full-time. The patient has had an increasing number of falls over the last few weeks. He has also started to wonder more through the house, which include several flights of stairs.  He believes that the patient's sister is the POA, but she lives in [place] and has been deferring to him for decision-making capacity. He reports that the patient is currently a full code, but this has waxed and waned on this decision several times in the past.  Patient states the year is 2010. Month is "12". Trump is president. He knows the city and states are [facility]. He is oriented to his first and last name.  Level 5 caveat secondary to altered mental status.   The history is provided by the patient. No language interpreter was used.       Past  Medical History:  Diagnosis Date  . Alcohol abuse   . Anxiety   . Arthritis    "never diagnosis" (09/02/2018)  . Ascites   . Blood clot in vein   . Cirrhosis ([place])   . Depression   . Dyspnea    Due to ascites  . Esophageal varices ([place]) 09/02/2014  . H/O [facility] spotted fever    age 12 or 59  . Hernia, inguinal    right  . Hiatal hernia 09/02/2014  . Liver cirrhosis ([place])   . Polysubstance abuse ([place])   . Portal hypertension with esophageal varices (HCC) 09/02/2014  . Portal hypertensive gastropathy ([place]) 09/02/2014  . Thrombocytopenia ([place]) 09/02/2014  . Upper GI bleed 08/31/2014    Patient Active Problem List   Diagnosis Date Noted  . Nausea & vomiting 02/11/2020  . Acute hepatic encephalopathy 02/11/2020  . Acute liver failure with hepatic coma ([place])   . Malnutrition of moderate degree 08/17/2018  . Accelerated junctional rhythm 08/17/2018  . Hypokalemia 08/16/2018  . Hypomagnesemia 08/16/2018  . Hepatic encephalopathy ([place]) 08/15/2018  . Peripheral neuropathy 08/02/2018  . Ascites due to alcoholic cirrhosis ([place]) 33/29/5188  . Depression 12/18/2016  . Testosterone deficiency in male 08/11/2016  . Inguinal hernia 02/28/2016  . Rectal bleed 01/24/2016  . Umbilical hernia 41/66/0630  . Ascites 01/24/2016  . Tobacco abuse 01/24/2016  . Gynecomastia, male 01/24/2016  . Esophageal varices ([place]) 09/02/2014  . Portal hypertension with esophageal varices (HCC)  09/02/2014  . Hiatal hernia 09/02/2014  . Leukopenia 09/02/2014  . Thrombocytopenia (HCC) 09/02/2014  . Alcoholic cirrhosis of liver with ascites [facility](HCC)     Past Surgical History:  Procedure Laterality Date  . ESOPHAGOGASTRODUODENOSCOPY Left 09/01/2014   Procedure: ESOPHAGOGASTRODUODENOSCOPY (EGD);  Surgeon: Charna ElizabethJyothi Mann, MD;  Location: [facility] ENDOSCOPY;  Service: Endoscopy;  Laterality: Left;  . EYE MUSCLE SURGERY Left 1967   "lazy eye"  . IR PARACENTESIS  03/16/2018  . IR PARACENTESIS  03/29/2018  . IR  PARACENTESIS  04/19/2018  . IR PARACENTESIS  04/27/2018  . IR PARACENTESIS  05/10/2018  . IR PARACENTESIS  05/19/2018  . IR PARACENTESIS  06/11/2018  . IR PARACENTESIS  06/29/2018  . IR PARACENTESIS  07/16/2018  . IR PARACENTESIS  08/03/2018  . IR PARACENTESIS  08/23/2018  . IR PARACENTESIS  09/17/2018  . IR PARACENTESIS  10/19/2018  . IR PARACENTESIS  10/29/2018  . IR PARACENTESIS  11/05/2018  . IR PARACENTESIS  11/12/2018  . IR PARACENTESIS  11/19/2018  . IR PARACENTESIS  12/23/2018  . IR PARACENTESIS  05/20/2019  . IR RADIOLOGIST EVAL & MGMT  05/13/2018  . IR RADIOLOGIST EVAL & MGMT  06/29/2018  . IR RADIOLOGIST EVAL & MGMT  12/28/2018  . IR RADIOLOGIST EVAL & MGMT  08/03/2019  . IR TIPS  05/19/2018  . RADIOLOGY WITH ANESTHESIA N/A 05/19/2018   Procedure: TIPS;  Surgeon: Simonne ComeWatts, John, MD;  Location: [facility] OR;  Service: Radiology;  Laterality: N/A;       Family History  Problem Relation Age of Onset  . Leukemia Father        acute myloid   . Hyperlipidemia Father   . Hypertension Father   . Colon cancer Neg Hx   . Esophageal cancer Neg Hx   . Pancreatic cancer Neg Hx   . Stomach cancer Neg Hx   . Liver disease Neg Hx     Social History   Tobacco Use  . Smoking status: Current Every Day Smoker    Packs/day: 1.00    Years: 43.00    Pack years: 43.00    Types: Cigarettes  . Smokeless tobacco: Never Used  Substance Use Topics  . Alcohol use: No    Comment: 09/02/2018 "no alcohol since 01/23/16"  . Drug use: Yes    Frequency: 5.0 times per week    Types: Marijuana    Comment: occ use     Home Medications Prior to Admission medications   Medication Sig Start Date End Date Taking? Authorizing Provider  aMILoride (MIDAMOR) 5 MG tablet TAKE 4 TABLETS BY MOUTH EVERY DAY 01/10/20  Yes Danis, Starr LakeHenry L III, MD  buPROPion (WELLBUTRIN SR) 100 MG 12 hr tablet TAKE 1 TABLET BY MOUTH EVERY DAY 09/29/19  Yes Newlin, Enobong, MD  FLUoxetine (PROZAC) 40 MG capsule TAKE 1 CAPSULE BY MOUTH EVERY DAY 01/12/20   Yes Newlin, Enobong, MD  furosemide (LASIX) 80 MG tablet TAKE 1 TABLET BY MOUTH EVERY DAY 01/10/20  Yes Danis, Starr LakeHenry L III, MD  gabapentin (NEURONTIN) 300 MG capsule TAKE 2 CAPSULES (600 MG TOTAL) BY MOUTH AT BEDTIME. 01/12/20  Yes Newlin, Enobong, MD  lactulose (CHRONULAC) 10 GM/15ML solution Take 45 mLs (30 g total) by mouth 4 (four) times daily. Patient taking differently: Take 30 g by mouth 4 (four) times daily as needed for moderate constipation.  10/04/19  Yes Danis, Andreas BlowerHenry L III, MD  midodrine (PROAMATINE) 5 MG tablet Take 1 tablet (5 mg total) by mouth 3 (three)  times daily. 9a, 2p, 9p pt has been taking a whole tablet 3 times a day 08/29/19  Yes Danis, Starr [place] III, MD  ondansetron (ZOFRAN) 4 MG tablet Take 1 tablet (4 mg total) by mouth every 8 (eight) hours as needed for nausea or vomiting. 08/29/19  Yes Newlin, Odette Horns, MD  potassium chloride SA (KLOR-CON M20) 20 MEQ tablet Take 2 tablets (40 mEq total) by mouth daily. 08/29/19  Yes [name], MD  rifaximin (XIFAXAN) 550 MG TABS tablet Take 1 tablet (550 mg total) by mouth 2 (two) times daily. 06/29/19  Yes Sherrilyn Rist, MD    Allergies    Latex  Review of Systems   Review of Systems  Unable to perform ROS: Mental status change  Gastrointestinal: Positive for abdominal pain and vomiting.    Physical Exam Updated Vital Signs BP 122/72   Pulse 77   Temp (!) 97.5 F (36.4 C) (Oral)   Resp 17   SpO2 99%   Physical Exam Vitals and nursing note reviewed.  Constitutional:      Appearance: He is well-developed.  HENT:     Head: Normocephalic.     Mouth/Throat:     Mouth: Mucous membranes are moist.  Eyes:     General: Scleral icterus present.     Conjunctiva/sclera: Conjunctivae normal.  Cardiovascular:     Rate and Rhythm: Normal rate and regular rhythm.     Heart sounds: No murmur. No friction rub. No gallop.   Pulmonary:     Effort: Pulmonary effort is normal. No respiratory distress.     Breath sounds: No  stridor. No wheezing, rhonchi or rales.  Chest:     Chest wall: No tenderness.  Abdominal:     General: There is no distension.     Palpations: Abdomen is soft. There is no mass.     Tenderness: There is abdominal tenderness. There is no right CVA tenderness, left CVA tenderness, guarding or rebound.     Hernia: No hernia is present.  Musculoskeletal:     Cervical back: Neck supple.  Skin:    General: Skin is warm and dry.     Capillary Refill: Capillary refill takes less than 2 seconds.     Coloration: Skin is jaundiced. Skin is not pale.     Findings: No bruising, erythema or lesion.  Neurological:     Mental Status: He is alert.  Psychiatric:        Behavior: Behavior normal.     ED Results / Procedures / Treatments   Labs (all labs ordered are listed, but only abnormal results are displayed) Labs Reviewed  LIPASE, BLOOD - Abnormal; Notable for the following components:      Result Value   Lipase 69 (*)    All other components within normal limits  COMPREHENSIVE METABOLIC PANEL - Abnormal; Notable for the following components:   Sodium 131 (*)    Potassium 3.2 (*)    Chloride 94 (*)    Glucose, Bld 103 (*)    Calcium 8.3 (*)    Albumin 2.4 (*)    AST 113 (*)    ALT 73 (*)    Alkaline Phosphatase 239 (*)    Total Bilirubin 5.4 (*)    All other components within normal limits  CBC - Abnormal; Notable for the following components:   RDW 16.2 (*)    Platelets 85 (*)    All other components within normal limits  URINALYSIS, ROUTINE W REFLEX MICROSCOPIC -  Abnormal; Notable for the following components:   APPearance CLOUDY (*)    Ketones, ur 5 (*)    Leukocytes,Ua LARGE (*)    WBC, UA >50 (*)    Bacteria, UA RARE (*)    All other components within normal limits  HEPATIC FUNCTION PANEL - Abnormal; Notable for the following components:   Total Protein 6.4 (*)    Albumin 2.3 (*)    AST 112 (*)    ALT 73 (*)    Alkaline Phosphatase 245 (*)    Total Bilirubin 5.5 (*)     Bilirubin, Direct 1.6 (*)    Indirect Bilirubin 3.9 (*)    All other components within normal limits  AMMONIA - Abnormal; Notable for the following components:   Ammonia 56 (*)    All other components within normal limits  MAGNESIUM - Abnormal; Notable for the following components:   Magnesium 1.5 (*)    All other components within normal limits  CBC WITH DIFFERENTIAL/PLATELET - Abnormal; Notable for the following components:   RBC 3.88 (*)    Hemoglobin 11.8 (*)    HCT 35.5 (*)    RDW 16.2 (*)    Platelets 72 (*)    Monocytes Absolute 1.1 (*)    Abs Immature Granulocytes 0.09 (*)    All other components within normal limits  COMPREHENSIVE METABOLIC PANEL - Abnormal; Notable for the following components:   Sodium 131 (*)    Glucose, Bld 112 (*)    Calcium 7.7 (*)    Total Protein 5.3 (*)    Albumin 2.0 (*)    AST 108 (*)    ALT 57 (*)    Alkaline Phosphatase 194 (*)    Total Bilirubin 5.0 (*)    All other components within normal limits  SARS CORONAVIRUS 2 (TAT 6-24 HRS)  URINE CULTURE  AMMONIA  HIV ANTIBODY (ROUTINE TESTING W REFLEX)    EKG None  Radiology No results found.  Procedures Procedures (including critical care time)  Medications Ordered in ED Medications  ondansetron (ZOFRAN) tablet 4 mg (has no administration in time range)    Or  ondansetron (ZOFRAN) injection 4 mg (has no administration in time range)  midodrine (PROAMATINE) tablet 5 mg (has no administration in time range)  aMILoride (MIDAMOR) tablet 20 mg (has no administration in time range)  buPROPion (WELLBUTRIN SR) 12 hr tablet 100 mg (has no administration in time range)  FLUoxetine (PROZAC) capsule 40 mg (has no administration in time range)  gabapentin (NEURONTIN) capsule 600 mg (has no administration in time range)  lactulose (CHRONULAC) 10 GM/15ML solution 30 g (has no administration in time range)  ondansetron (ZOFRAN) tablet 4 mg (has no administration in time range)  rifaximin  (XIFAXAN) tablet 550 mg (has no administration in time range)  sodium chloride 0.9 % bolus 1,000 mL (0 mLs Intravenous Stopped 02/11/20 0246)  ondansetron (ZOFRAN) injection 4 mg (4 mg Intravenous Given 02/11/20 0058)  lactulose (CHRONULAC) 10 GM/15ML solution 20 g (20 g Oral Given 02/11/20 0247)  magnesium sulfate IVPB 2 g 50 mL (0 g Intravenous Stopped 02/11/20 0353)  potassium chloride SA (KLOR-CON) CR tablet 40 mEq (40 mEq Oral Given 02/11/20 0247)  cefTRIAXone (ROCEPHIN) 1 g in sodium chloride 0.9 % 100 mL IVPB (0 g Intravenous Stopped 02/11/20 0331)    ED Course  I have reviewed the triage vital signs and the nursing notes.  Pertinent labs & imaging results that were available during my care of the patient were  reviewed by me and considered in my medical decision making (see chart for details).    MDM Rules/Calculators/A&P                      59 year old male with a history of alcohol use disorder who has been abstinent for >5 years, liver cirrhosis, portal hypertension with esophageal varices, portal hypertensive gastropathy, rocky mountain spotted fever presenting with nausea, vomiting, and right upper quadrant abdominal pain.  He has had worsening confusion since onset of symptoms this morning because he has not taken his lactulose.   Family also expresses increasing concerns about the patient's safety as his confusion has worsened over the last few weeks and he has been wandering around the house more and has had increasing falls.  No falls for the last few days.  On exam, he had tenderness in the right upper quadrant.  No peritoneal signs.  Transaminases and alkaline phosphatase are up trending from 1 month ago.  Bilirubin is 5.4, up from 3.8.  He is mildly hypokalemic and has hypomagnesemia.  Oral potassium chloride replenishment and IV magnesium replenishment given in the ER.  Ammonia is a 56.  He is very confused on exam.  He has been given Zofran and IV fluids.  Will order his home  dose of lactulose.  Urinalysis is concerning for infection.  Will give a dose of Rocephin in the ER and send his urine for culture.  Given worsening hepatic encephalopathy and more frequent falls in the setting of uptrending labs, he would benefit from admission.  Family also feels that they are now unable to care for the patient at home given increased fall risk.  He would benefit from social work consult placement for safe disposition.  Doubt upper GI bleed, cholecystitis, SBP, choledocholithiasis at this time.  Consult to the hospitalist team and Dr. Julian Reil will admit. The patient appears reasonably stabilized for admission considering the current resources, flow, and capabilities available in the ED at this time, and I doubt any other [facility] requiring further screening and/or treatment in the ED prior to admission.   Final Clinical Impression(s) / ED Diagnoses Final diagnoses:  Hepatic encephalopathy (HCC)  Alcoholic cirrhosis of liver without ascites (HCC)  Frequent falls    Rx / DC Orders ED Discharge Orders    None       Gurshan Settlemire A, PA-C 02/11/20 0744    Zadie Rhine, MD 02/12/20 0200

## 2020-02-12 ENCOUNTER — Inpatient Hospital Stay (HOSPITAL_COMMUNITY): Payer: Medicaid Other

## 2020-02-12 DIAGNOSIS — D6959 Other secondary thrombocytopenia: Secondary | ICD-10-CM | POA: Diagnosis present

## 2020-02-12 DIAGNOSIS — K7030 Alcoholic cirrhosis of liver without ascites: Secondary | ICD-10-CM | POA: Diagnosis not present

## 2020-02-12 DIAGNOSIS — F329 Major depressive disorder, single episode, unspecified: Secondary | ICD-10-CM

## 2020-02-12 DIAGNOSIS — K7040 Alcoholic hepatic failure without coma: Secondary | ICD-10-CM | POA: Diagnosis present

## 2020-02-12 DIAGNOSIS — R296 Repeated falls: Secondary | ICD-10-CM | POA: Diagnosis present

## 2020-02-12 DIAGNOSIS — K766 Portal hypertension: Secondary | ICD-10-CM | POA: Diagnosis present

## 2020-02-12 DIAGNOSIS — E876 Hypokalemia: Secondary | ICD-10-CM

## 2020-02-12 DIAGNOSIS — Z1623 Resistance to quinolones and fluoroquinolones: Secondary | ICD-10-CM | POA: Diagnosis present

## 2020-02-12 DIAGNOSIS — R4182 Altered mental status, unspecified: Secondary | ICD-10-CM | POA: Diagnosis present

## 2020-02-12 DIAGNOSIS — Z20822 Contact with and (suspected) exposure to covid-19: Secondary | ICD-10-CM | POA: Diagnosis present

## 2020-02-12 DIAGNOSIS — F17210 Nicotine dependence, cigarettes, uncomplicated: Secondary | ICD-10-CM | POA: Diagnosis present

## 2020-02-12 DIAGNOSIS — Z79899 Other long term (current) drug therapy: Secondary | ICD-10-CM | POA: Diagnosis not present

## 2020-02-12 DIAGNOSIS — N390 Urinary tract infection, site not specified: Secondary | ICD-10-CM | POA: Diagnosis present

## 2020-02-12 DIAGNOSIS — K7200 Acute and subacute hepatic failure without coma: Secondary | ICD-10-CM | POA: Diagnosis not present

## 2020-02-12 DIAGNOSIS — B957 Other staphylococcus as the cause of diseases classified elsewhere: Secondary | ICD-10-CM | POA: Diagnosis present

## 2020-02-12 DIAGNOSIS — K7031 Alcoholic cirrhosis of liver with ascites: Secondary | ICD-10-CM | POA: Diagnosis present

## 2020-02-12 LAB — PHOSPHORUS: Phosphorus: 4.6 mg/dL (ref 2.5–4.6)

## 2020-02-12 LAB — COMPREHENSIVE METABOLIC PANEL
ALT: 55 U/L — ABNORMAL HIGH (ref 0–44)
AST: 74 U/L — ABNORMAL HIGH (ref 15–41)
Albumin: 2 g/dL — ABNORMAL LOW (ref 3.5–5.0)
Alkaline Phosphatase: 192 U/L — ABNORMAL HIGH (ref 38–126)
Anion gap: 8 (ref 5–15)
BUN: 15 mg/dL (ref 6–20)
CO2: 26 mmol/L (ref 22–32)
Calcium: 8.4 mg/dL — ABNORMAL LOW (ref 8.9–10.3)
Chloride: 100 mmol/L (ref 98–111)
Creatinine, Ser: 1.07 mg/dL (ref 0.61–1.24)
GFR calc Af Amer: >60 mL/min (ref >60)
GFR calc non Af Amer: >60 mL/min (ref >60)
Glucose, Bld: 93 mg/dL (ref 70–99)
Potassium: 3.1 mmol/L — ABNORMAL LOW (ref 3.5–5.1)
Sodium: 134 mmol/L — ABNORMAL LOW (ref 135–145)
Total Bilirubin: 6.2 mg/dL — ABNORMAL HIGH (ref 0.3–1.2)
Total Protein: 5.5 g/dL — ABNORMAL LOW (ref 6.5–8.1)

## 2020-02-12 LAB — CBC WITH DIFFERENTIAL/PLATELET
Abs Immature Granulocytes: 0.06 10*3/uL (ref 0.00–0.07)
Basophils Absolute: 0 10*3/uL (ref 0.0–0.1)
Basophils Relative: 1 %
Eosinophils Absolute: 0.2 10*3/uL (ref 0.0–0.5)
Eosinophils Relative: 3 %
HCT: 33.2 % — ABNORMAL LOW (ref 39.0–52.0)
Hemoglobin: 11.3 g/dL — ABNORMAL LOW (ref 13.0–17.0)
Immature Granulocytes: 1 %
Lymphocytes Relative: 16 %
Lymphs Abs: 1 10*3/uL (ref 0.7–4.0)
MCH: 31.1 pg (ref 26.0–34.0)
MCHC: 34 g/dL (ref 30.0–36.0)
MCV: 91.5 fL (ref 80.0–100.0)
Monocytes Absolute: 1.3 10*3/uL — ABNORMAL HIGH (ref 0.1–1.0)
Monocytes Relative: 20 %
Neutro Abs: 3.8 10*3/uL (ref 1.7–7.7)
Neutrophils Relative %: 59 %
Platelets: 67 10*3/uL — ABNORMAL LOW (ref 150–400)
RBC: 3.63 MIL/uL — ABNORMAL LOW (ref 4.22–5.81)
RDW: 16.1 % — ABNORMAL HIGH (ref 11.5–15.5)
WBC: 6.3 10*3/uL (ref 4.0–10.5)
nRBC: 0 % (ref 0.0–0.2)

## 2020-02-12 LAB — MAGNESIUM: Magnesium: 1.8 mg/dL (ref 1.7–2.4)

## 2020-02-12 MED ORDER — MAGNESIUM SULFATE 4 GM/100ML IV SOLN
4.0000 g | Freq: Once | INTRAVENOUS | Status: AC
  Administered 2020-02-12: 09:00:00 4 g via INTRAVENOUS
  Filled 2020-02-12: qty 100

## 2020-02-12 MED ORDER — POTASSIUM CHLORIDE CRYS ER 20 MEQ PO TBCR
40.0000 meq | EXTENDED_RELEASE_TABLET | ORAL | Status: AC
  Administered 2020-02-12 (×2): 40 meq via ORAL
  Filled 2020-02-12 (×2): qty 2

## 2020-02-12 NOTE — Progress Notes (Signed)
PROGRESS NOTE    Kenneth [name]  JIR:[id number] DOB: [birth year] DOA: 02/10/2020 PCP: Charlott Rakes, MD    Chief Complaint  Patient presents with  . Nausea  . Abdominal Pain    Brief Narrative:  HPI per Dr. Annie Paras is a 59 y.o. male with medical history significant of ESLD on hospice from EtOH cirrhosis of liver.  Chronic thrombocytopenia, esophageal varices.  Pt still a full code as of hospice note just a couple of days ago.  Pt still resides at home.  Pt presents to the ED with c/o 6 episodes of NBNB emesis since yesterday morning.  Unable to take home med including lactulose and rifaximin.  Worsening confusion per son throughout day.  He reports that as the patient's confusion has been worsening that he is concerned that the patient needs a higher level of care than what he and his mother are able to provide for him at home. Both he and his mother works full-time. The patient has had an increasing number of falls over the last few weeks.He has also started to wonder more through the house,which include several flights of stairs.  Initially having abd pain apparently (though pain resolved by the time Im seeing him in ED).   ED Course: K 3.2 (replaced), Mg 1.5 (replaced).  Ammonia of 56, T bili 5.5, platelets 85 (chronic).  Assessment & Plan:   Principal Problem:   Hepatic encephalopathy ([place]) Active Problems:   Thrombocytopenia (HCC)   Alcoholic cirrhosis of liver with ascites (HCC)   Nausea & vomiting   Acute hepatic encephalopathy   Acute lower UTI   Frequent falls  1 acute hepatic encephalopathy Patient noted to have presented with worsening confusion with elevated ammonia levels.  Patient noted to have tolerated lactulose in the ED and had no further nausea or vomiting at that time.  Patient noted to have some episodes of nausea and vomiting yesterday.  Lactulose and Xifaxan resumed.  Patient alert to self and place only.  Patient still somewhat  confused however baseline unknown.  PT/OT.  Social work consulted for possible placement as per admission note family unable to take care of patient.  Supportive care.  2.  Nausea vomiting Questionable etiology.  Patient noted to have some emesis yesterday.  PT note.  Patient with no abdominal pain.  Will check abdominal films.  On clear liquids.  Will follow.  Advance to full liquid diet.  Antiemetics.  Supportive care.  3.  UTI Urine cultures pending.  IV Rocephin day 2.  4.  Chronic thrombocytopenia Likely secondary to cirrhosis.  Patient with no overt bleeding.  Stable.  Follow.  5.  Alcoholic cirrhosis of the liver Patient noted to be in acute hepatic encephalopathy.  Currently on lactulose and Xifaxan.  Lasix on hold.  Follow.  6.  Hypokalemia/hypomagnesemia Patient noted to be on chronic potassium supplementation.  Patient diuretics however on hold.  Potassium at 3.1 this morning.  Magnesium at 1.8.  Magnesium sulfate 4 g IV x1.  K. Dur 40 mEq p.o. every 4 hours x2 doses.  Repeat labs in the morning.  7.  Depression Continue Wellbutrin, Prozac.  8.  Falls PT/OT.   DVT prophylaxis: SCDs Code Status: Full Family Communication: Updated patient.  No family at bedside. Disposition:   Status is: Observation    Dispo: The patient is from: Home              Anticipated d/c is to: To be determined.  Anticipated d/c date is: 02/14/2020              Patient currently still confused, still with emesis noted yesterday, on IV antibiotics for UTI with cultures pending.       Consultants:   None  Procedures:  None  Antimicrobials:  IV Rocephin 02/11/2020   Subjective: Patient in bed sleeping however easily arousable.  Alert to self and place.  Otherwise pleasantly confused.  No chest pain.  Denies any nausea or emesis this morning.  Patient noted to have emesis yesterday per PT note.   Objective: Vitals:   02/11/20 1735 02/11/20 2207 02/12/20 0601 02/12/20  1411  BP: 121/72 130/77 127/77 119/83  Pulse: 77 74 66 69  Resp:  16 16 18   Temp: 98.5 F (36.9 C) 98.3 F (36.8 C) 98 F (36.7 C) 97.9 F (36.6 C)  TempSrc: Oral Oral Oral Oral  SpO2: 100% 100% 99% 100%    Intake/Output Summary (Last 24 hours) at 02/12/2020 1620 Last data filed at 02/12/2020 1400 Gross per 24 hour  Intake 1119.97 ml  Output 650 ml  Net 469.97 ml   There were no vitals filed for this visit.  Examination:  General exam: NAD Respiratory system: Clear to auscultation. Respiratory effort normal. Cardiovascular system: S1 & S2 heard, RRR. No JVD, murmurs, rubs, gallops or clicks. No pedal edema. Gastrointestinal system: Abdomen is nondistended, soft and nontender. No organomegaly or masses felt. Normal bowel sounds heard. Central nervous system: Alert. No focal neurological deficits.  Moving extremities spontaneously. Extremities: Symmetric 5 x 5 power. Skin: No rashes, lesions or ulcers Psychiatry: Judgement and insight appear normal. Mood & affect appropriate.     Data Reviewed: I have personally reviewed following labs and imaging studies  CBC: Recent Labs  Lab 02/10/20 2303 02/11/20 0625 02/12/20 0522  WBC 7.8 8.1 6.3  NEUTROABS  --  5.8 3.8  HGB 13.3 11.8* 11.3*  HCT 39.9 35.5* 33.2*  MCV 93.0 91.5 91.5  PLT 85* 72* 67*    Basic Metabolic Panel: Recent Labs  Lab 02/10/20 2303 02/11/20 0112 02/11/20 0625 02/12/20 0522  NA 131*  --  131* 134*  K 3.2*  --  4.0 3.1*  CL 94*  --  98 100  CO2 27  --  24 26  GLUCOSE 103*  --  112* 93  BUN 14  --  13 15  CREATININE 1.20  --  0.89 1.07  CALCIUM 8.3*  --  7.7* 8.4*  MG  --  1.5*  --  1.8  PHOS  --   --   --  4.6    GFR: CrCl cannot be calculated (Unknown ideal weight.).  Liver Function Tests: Recent Labs  Lab 02/10/20 2303 02/11/20 0625 02/12/20 0522  AST 112*  113* 108* 74*  ALT 73*  73* 57* 55*  ALKPHOS 245*  239* 194* 192*  BILITOT 5.5*  5.4* 5.0* 6.2*  PROT 6.4*  6.6  5.3* 5.5*  ALBUMIN 2.3*  2.4* 2.0* 2.0*    CBG: No results for input(s): GLUCAP in the last 168 hours.   Recent Results (from the past 240 hour(s))  SARS CORONAVIRUS 2 (TAT 6-24 HRS) Nasopharyngeal Nasopharyngeal Swab     Status: None   Collection Time: 02/11/20 12:59 AM   Specimen: Nasopharyngeal Swab  Result Value Ref Range Status   SARS Coronavirus 2 NEGATIVE NEGATIVE Final    Comment: (NOTE) SARS-CoV-2 target nucleic acids are NOT DETECTED. The SARS-CoV-2 RNA is generally detectable in  upper and lower respiratory specimens during the acute phase of infection. Negative results do not preclude SARS-CoV-2 infection, do not rule out co-infections with other pathogens, and should not be used as the sole basis for treatment or other patient management decisions. Negative results must be combined with clinical observations, patient history, and epidemiological information. The expected result is Negative. Fact Sheet for Patients: HairSlick.no Fact Sheet for Healthcare Providers: quierodirigir.com This test is not yet approved or cleared by the [place] FDA and  has been authorized for detection and/or diagnosis of SARS-CoV-2 by FDA under an Emergency Use Authorization (EUA). This EUA will remain  in effect (meaning this test can be used) for the duration of the COVID-19 declaration under Section 56 4(b)(1) of the Act, 21 U.S.C. section 360bbb-3(b)(1), unless the authorization is terminated or revoked sooner. Performed at [facility], [address], [place], [place] [address]   Urine culture     Status: Abnormal (Preliminary result)   Collection Time: 02/11/20  2:14 AM   Specimen: Urine, Random  Result Value Ref Range Status   Specimen Description   Final    URINE, RANDOM Performed at [facility], [address], [place], [place] [address]    Special Requests   Final    NONE Performed at  [facility], [address], [place], [place] [address]    Culture (A)  Final    >=100,000 COLONIES/mL STAPHYLOCOCCUS HAEMOLYTICUS SUSCEPTIBILITIES TO FOLLOW Performed at [facility], [address] [place], [place] [address]    Report Status PENDING  Incomplete         Radiology Studies: No results found.      Scheduled Meds: . aMILoride  20 mg Oral Daily  . buPROPion  100 mg Oral Daily  . FLUoxetine  40 mg Oral Daily  . gabapentin  600 mg Oral QHS  . lactulose  30 g Oral QID  . midodrine  5 mg Oral 3 times per day  . rifaximin  550 mg Oral BID   Continuous Infusions: . cefTRIAXone (ROCEPHIN)  IV 1 g (02/12/20 0549)     LOS: 0 days    Time spent: 35 mins    Ramiro Harvest, MD [facility]   To contact the attending provider between 7A-7P or the covering provider during after hours 7P-7A, please log into the web site [email or web address] and access using universal [place] password for that web site. If you do not have the password, please call the hospital operator.  02/12/2020, 4:20 PM

## 2020-02-12 NOTE — Evaluation (Signed)
Occupational Therapy Evaluation Patient Details Name: Kenneth [name] MRN: [id number] DOB: 27-Mar-1961 Today's Date: 02/12/2020    History of Present Illness Pt admitted with N/V, abdominal pain and AMS and dx with hepatic encephalopathy.  Pt with hx of polysubstance abuse, ETOH Cirrhosis of liver and ESLD   Clinical Impression   Pt admitted with N/V Pt currently with functional limitations due to the deficits listed below (see OT Problem List).  Pt will benefit from skilled OT to increase their safety and independence with ADL and functional mobility for ADL to facilitate discharge to venue listed below.      Follow Up Recommendations  SNF;Home health OT(depending on A at home)    Equipment Recommendations  None recommended by OT    Recommendations for Other Services       Precautions / Restrictions Precautions Precautions: Fall Restrictions Weight Bearing Restrictions: No      Mobility Bed Mobility Overal bed mobility: Needs Assistance Bed Mobility: Supine to Sit     Supine to sit: Min assist;Mod assist     General bed mobility comments: Repeated cueing with increased time and physical assist to bring LEs over EOB and trunk to upright  Transfers Overall transfer level: Needs assistance Equipment used: Rolling walker (2 wheeled) Transfers: Sit to/from [facility] Sit to Stand: Min assist;Mod assist Stand pivot transfers: Min assist;Mod assist       General transfer comment: cues for safe transition position and use of UEs to self assist; physical assist to bring weight up and fwd, to balance in standing and for control of descent to chair    Balance Overall balance assessment: Needs assistance Sitting-balance support: No upper extremity supported Sitting balance-Leahy Scale: Fair     Standing balance support: Bilateral upper extremity supported Standing balance-Leahy Scale: Poor                             ADL either performed or  assessed with clinical judgement   ADL Overall ADL's : Needs assistance/impaired Eating/Feeding: Moderate assistance;Sitting   Grooming: Moderate assistance;Sitting   Upper Body Bathing: Moderate assistance;Sitting   Lower Body Bathing: Maximal assistance;Sit to/from stand;Cueing for safety;Cueing for compensatory techniques   Upper Body Dressing : Moderate assistance;Sitting   Lower Body Dressing: Maximal assistance;Sit to/from stand;Cueing for compensatory techniques;Cueing for safety;Cueing for sequencing   Toilet Transfer: Stand-pivot;Moderate assistance;+2 for safety/equipment   Toileting- Clothing Manipulation and Hygiene: Maximal assistance;Sit to/from stand;Cueing for safety;+2 for safety/equipment         General ADL Comments: pt falling asleep during OT session.     Vision   Vision Assessment?: No apparent visual deficits     Perception     Praxis      Pertinent Vitals/Pain Pain Assessment: No/denies pain     Hand Dominance     Extremity/Trunk Assessment Upper Extremity Assessment Upper Extremity Assessment: Generalized weakness   Lower Extremity Assessment Lower Extremity Assessment: Generalized weakness       Communication Communication Communication: Expressive difficulties(Mumbles/slurs words - often difficult to understand)   Cognition Arousal/Alertness: Lethargic Behavior During Therapy: Flat affect Overall Cognitive Status: No family/caregiver present to determine baseline cognitive functioning                                 General Comments: Pt with delayed responses and difficulty processing cues/questions  Home Living Family/patient expects to be discharged to:: Unsure                                 Additional Comments: Pt is unreliable historian but states he lives with his parents      Prior Functioning/Environment          Comments: Pt states he uses RW        OT Problem  List: Decreased strength;Decreased activity tolerance;Impaired balance (sitting and/or standing);Decreased safety awareness;Decreased knowledge of use of DME or AE      OT Treatment/Interventions: Self-care/ADL training;Patient/family education;DME and/or AE instruction;Therapeutic activities    OT Goals(Current goals can be found in the care plan section) Acute Rehab OT Goals Patient Stated Goal: No goals expressed - pt agreeable to get up to chair OT Goal Formulation: With patient Time For Goal Achievement: 02/19/20 Potential to Achieve Goals: Good  OT Frequency: Min 2X/week   Barriers to D/C:            Co-evaluation   Reason for Co-Treatment: For patient/therapist safety PT goals addressed during session: Mobility/safety with mobility OT goals addressed during session: ADL's and self-care      AM-PAC OT "6 Clicks" Daily Activity     Outcome Measure Help from another person eating meals?: A Little Help from another person taking care of personal grooming?: A Little Help from another person toileting, which includes using toliet, bedpan, or urinal?: A Lot Help from another person bathing (including washing, rinsing, drying)?: A Lot Help from another person to put on and taking off regular upper body clothing?: A Little Help from another person to put on and taking off regular lower body clothing?: A Lot 6 Click Score: 15   End of Session Equipment Utilized During Treatment: Rolling walker Nurse Communication: Mobility status  Activity Tolerance: Patient limited by lethargy Patient left: in chair;with call bell/phone within reach;with chair alarm set  OT Visit Diagnosis: Unsteadiness on feet (R26.81);Other abnormalities of gait and mobility (R26.89);Repeated falls (R29.6);Muscle weakness (generalized) (M62.81);History of falling (Z91.81)                Time: 1140-1159 OT Time Calculation (min): 19 min Charges:  OT General Charges $OT Visit: 1 Visit OT Evaluation $OT  Eval Moderate Complexity: 1 Mod  Lise Auer, OT Acute Rehabilitation Services [phone] Office- [phone]     Analise Glotfelty, Karin Golden D 02/12/2020, 2:59 PM

## 2020-02-12 NOTE — Evaluation (Signed)
Physical Therapy Evaluation Patient Details Name: Kenneth [name] MRN: [id number] DOB: 03/15/1961 Today's Date: 02/12/2020   History of Present Illness  Pt admitted with N/V, abdominal pain and AMS and dx with hepatic encephalopathy.  Pt with hx of polysubstance abuse, ETOH Cirrhosis of liver and ESLD  Clinical Impression  Pt admitted as above and presenting with functional mobility limitations 2* generalized weakness, balance deficits, poor safety awareness, decreased endurance and cognitive deficits including poor problem-solving ability.  Pt would benefit from follow up rehab at SNF level to maximize IND unless family is in a position to provide 24/7 assist.    Follow Up Recommendations SNF    Equipment Recommendations  None recommended by PT    Recommendations for Other Services       Precautions / Restrictions Precautions Precautions: Fall Restrictions Weight Bearing Restrictions: No      Mobility  Bed Mobility Overal bed mobility: Needs Assistance Bed Mobility: Supine to Sit     Supine to sit: Min assist;Mod assist     General bed mobility comments: Repeated cueing with increased time and physical assist to bring LEs over EOB and trunk to upright  Transfers Overall transfer level: Needs assistance Equipment used: Rolling walker (2 wheeled) Transfers: Sit to/from [facility] Sit to Stand: Min assist;Mod assist Stand pivot transfers: Min assist;Mod assist       General transfer comment: cues for safe transition position and use of UEs to self assist; physical assist to bring weight up and fwd, to balance in standing and for control of descent to chair  Ambulation/Gait Ambulation/Gait assistance: Min assist;Mod assist Gait Distance (Feet): 3 Feet Assistive device: Rolling walker (2 wheeled) Gait Pattern/deviations: Step-to pattern;Decreased step length - right;Decreased step length - left;Shuffle;Trunk flexed Gait velocity: decr   General Gait  Details: Step-be-step repeated cues for sequence, position from RW and basic safety awareness  Stairs            Wheelchair Mobility    Modified Rankin (Stroke Patients Only)       Balance Overall balance assessment: Needs assistance Sitting-balance support: No upper extremity supported Sitting balance-Leahy Scale: Fair     Standing balance support: Bilateral upper extremity supported Standing balance-Leahy Scale: Poor                               Pertinent Vitals/Pain Pain Assessment: No/denies pain    Home Living Family/patient expects to be discharged to:: Unsure                 Additional Comments: Pt is unreliable historian but states he lives with his parents    Prior Function           Comments: Pt states he uses RW     Journalist, newspaper        Extremity/Trunk Assessment   Upper Extremity Assessment Upper Extremity Assessment: Generalized weakness    Lower Extremity Assessment Lower Extremity Assessment: Generalized weakness       Communication   Communication: Expressive difficulties(Mumbles/slurs words - often difficult to understand)  Cognition Arousal/Alertness: Lethargic Behavior During Therapy: Flat affect Overall Cognitive Status: No family/caregiver present to determine baseline cognitive functioning                                 General Comments: Pt with delayed responses and difficulty processing cues/questions  General Comments      Exercises     Assessment/Plan    PT Assessment Patient needs continued PT services  PT Problem List Decreased strength;Decreased activity tolerance;Decreased balance;Decreased mobility;Decreased knowledge of use of DME;Decreased cognition       PT Treatment Interventions DME instruction;Gait training;Functional mobility training;Therapeutic activities;Therapeutic exercise;Balance training;Cognitive remediation;Patient/family education    PT Goals  (Current goals can be found in the Care Plan section)  Acute Rehab PT Goals Patient Stated Goal: No goals expressed - pt agreeable to get up to chair PT Goal Formulation: Patient unable to participate in goal setting Time For Goal Achievement: 02/26/20 Potential to Achieve Goals: Fair    Frequency Min 3X/week   Barriers to discharge Decreased caregiver support      Co-evaluation PT/OT/SLP Co-Evaluation/Treatment: Yes Reason for Co-Treatment: For patient/therapist safety PT goals addressed during session: Mobility/safety with mobility OT goals addressed during session: ADL's and self-care       AM-PAC PT "6 Clicks" Mobility  Outcome Measure Help needed turning from your back to your side while in a flat bed without using bedrails?: A Little Help needed moving from lying on your back to sitting on the side of a flat bed without using bedrails?: A Lot Help needed moving to and from a bed to a chair (including a wheelchair)?: A Lot Help needed standing up from a chair using your arms (e.g., wheelchair or bedside chair)?: A Lot Help needed to walk in hospital room?: A Lot Help needed climbing 3-5 steps with a railing? : A Lot 6 Click Score: 13    End of Session Equipment Utilized During Treatment: Gait belt Activity Tolerance: Patient tolerated treatment well;Patient limited by fatigue Patient left: in chair;with call bell/phone within reach;with chair alarm set Nurse Communication: Mobility status PT Visit Diagnosis: Difficulty in walking, not elsewhere classified (R26.2);Unsteadiness on feet (R26.81);Muscle weakness (generalized) (M62.81);History of falling (Z91.81)    Time: 6283-1517 PT Time Calculation (min) (ACUTE ONLY): 16 min   Charges:   PT Evaluation $PT Eval Low Complexity: 1 Low          Mauro Kaufmann PT Acute Rehabilitation Services Pager [phone] Office [phone]   Dashia Caldeira 02/12/2020, 12:46 PM

## 2020-02-13 LAB — CBC WITH DIFFERENTIAL/PLATELET
Abs Immature Granulocytes: 0.03 10*3/uL (ref 0.00–0.07)
Basophils Absolute: 0.1 10*3/uL (ref 0.0–0.1)
Basophils Relative: 1 %
Eosinophils Absolute: 0.2 10*3/uL (ref 0.0–0.5)
Eosinophils Relative: 3 %
HCT: 34.9 % — ABNORMAL LOW (ref 39.0–52.0)
Hemoglobin: 11.7 g/dL — ABNORMAL LOW (ref 13.0–17.0)
Immature Granulocytes: 0 %
Lymphocytes Relative: 15 %
Lymphs Abs: 1 10*3/uL (ref 0.7–4.0)
MCH: 31.2 pg (ref 26.0–34.0)
MCHC: 33.5 g/dL (ref 30.0–36.0)
MCV: 93.1 fL (ref 80.0–100.0)
Monocytes Absolute: 1.1 10*3/uL — ABNORMAL HIGH (ref 0.1–1.0)
Monocytes Relative: 17 %
Neutro Abs: 4.5 10*3/uL (ref 1.7–7.7)
Neutrophils Relative %: 64 %
Platelets: 73 10*3/uL — ABNORMAL LOW (ref 150–400)
RBC: 3.75 MIL/uL — ABNORMAL LOW (ref 4.22–5.81)
RDW: 16 % — ABNORMAL HIGH (ref 11.5–15.5)
WBC: 6.9 10*3/uL (ref 4.0–10.5)
nRBC: 0 % (ref 0.0–0.2)

## 2020-02-13 LAB — URINE CULTURE: Culture: >=100000 — AB

## 2020-02-13 LAB — COMPREHENSIVE METABOLIC PANEL
ALT: 52 U/L — ABNORMAL HIGH (ref 0–44)
AST: 71 U/L — ABNORMAL HIGH (ref 15–41)
Albumin: 2 g/dL — ABNORMAL LOW (ref 3.5–5.0)
Alkaline Phosphatase: 179 U/L — ABNORMAL HIGH (ref 38–126)
Anion gap: 5 (ref 5–15)
BUN: 13 mg/dL (ref 6–20)
CO2: 25 mmol/L (ref 22–32)
Calcium: 9.1 mg/dL (ref 8.9–10.3)
Chloride: 105 mmol/L (ref 98–111)
Creatinine, Ser: 0.94 mg/dL (ref 0.61–1.24)
GFR calc Af Amer: >60 mL/min (ref >60)
GFR calc non Af Amer: >60 mL/min (ref >60)
Glucose, Bld: 107 mg/dL — ABNORMAL HIGH (ref 70–99)
Potassium: 4.2 mmol/L (ref 3.5–5.1)
Sodium: 135 mmol/L (ref 135–145)
Total Bilirubin: 4.9 mg/dL — ABNORMAL HIGH (ref 0.3–1.2)
Total Protein: 5.6 g/dL — ABNORMAL LOW (ref 6.5–8.1)

## 2020-02-13 LAB — MAGNESIUM: Magnesium: 1.9 mg/dL (ref 1.7–2.4)

## 2020-02-13 MED ORDER — NITROFURANTOIN MONOHYD MACRO 100 MG PO CAPS
100.0000 mg | ORAL_CAPSULE | Freq: Two times a day (BID) | ORAL | Status: DC
  Administered 2020-02-13 – 2020-02-14 (×4): 100 mg via ORAL
  Filled 2020-02-13 (×4): qty 1

## 2020-02-13 NOTE — Plan of Care (Signed)
Plan of care reviewed and discussed with the patient. 

## 2020-02-13 NOTE — Progress Notes (Signed)
Physical Therapy Treatment Patient Details Name: Kenneth [name] MRN: [id number] DOB: [birth year] Today's Date: 02/13/2020    History of Present Illness Pt admitted with N/V, abdominal pain and AMS and dx with hepatic encephalopathy.  Pt with hx of polysubstance abuse, ETOH Cirrhosis of liver and ESLD    PT Comments    Pt OOB in recliner more alert/awake.  Still present with poor recall and impaired safety awareness.  Assisted with amb to bathroom.  General transfer comment: 75% VC's on safety with turns and using walker throughout for safety.  Easily distracted.  Impaired cognition.  Required increased time to process and only able to follow one step commands.  Pt replied "Oh yeah, you said that" when instructed to walk to sink and wash hands.  Assisted totoilet pt required increased time for hygiene task.  Unsteady standing balance with both sway and mild tremors.  HIGH FALL RISK.  Impaired environmental safety awareness missing trash can and turning walker into wall. General Gait Details: VERY unsteady, drunken gait with therapisdt correcting walker distance and navigation 50% of time as pt ran into door frame and turns tripping his feet on walker back legs. Assisted back to his room and in recliner.   Pt will need ST Rehab at SNF for gait instability prior to safe D/C back home with Son.   Follow Up Recommendations  SNF     Equipment Recommendations  None recommended by PT    Recommendations for Other Services       Precautions / Restrictions Precautions Precautions: Fall Restrictions Weight Bearing Restrictions: No    Mobility  Bed Mobility Overal bed mobility: Needs Assistance Bed Mobility: Supine to Sit     Supine to sit: Min assist     General bed mobility comments: OOB in recliner  Transfers Overall transfer level: Needs assistance Equipment used: Rolling walker (2 wheeled);None Transfers: Sit to/from [employer] to Stand: Min assist Stand pivot  transfers: Min assist;Mod assist       General transfer comment: 75% VC's on safety with turns and using walker throughout for safety.  Easily distracted.  Impaired cognition.  Required increased time to process and only able to follow one step commands.  Pt replied "Oh yeah, you said that" when instructed to walk to sink and wash hands.  Assisted totoilet pt required increased time for hygiene task.  Unsteady standing balance with both sway and mild tremors.  HIGH FALL RISK.  Impaired environmental safety awareness missing trash can and turning walker into wall.  Ambulation/Gait Ambulation/Gait assistance: Min assist;Mod assist Gait Distance (Feet): 58 Feet Assistive device: Rolling walker (2 wheeled) Gait Pattern/deviations: Step-to pattern;Decreased step length - right;Decreased step length - left;Shuffle;Trunk flexed Gait velocity: decreased   General Gait Details: VERY unsteady, drunken gait with therapisdt correcting walker distance and navigation 50% of time as pt ran into door frame and turns tripping his feet on walker back legs.   Stairs             Wheelchair Mobility    Modified Rankin (Stroke Patients Only)       Balance Overall balance assessment: Needs assistance Sitting-balance support: No upper extremity supported Sitting balance-Leahy Scale: Fair     Standing balance support: Bilateral upper extremity supported Standing balance-Leahy Scale: Poor                              Cognition Arousal/Alertness: Awake/alert Behavior During Therapy: Flat  affect Overall Cognitive Status: Impaired/Different from baseline Area of Impairment: Attention;Memory;Following commands;Safety/judgement;Awareness                       Following Commands: Follows one step commands inconsistently Safety/Judgement: Decreased awareness of safety;Decreased awareness of deficits     General Comments: pt is more alert but inconsistant recall and impaired  judgement      Exercises      General Comments        Pertinent Vitals/Pain Pain Assessment: Faces Faces Pain Scale: Hurts a little bit Pain Location: stated his L hand fingers feel "numb" Pain Descriptors / Indicators: Numbness Pain Intervention(s): Monitored during session    Home Living Family/patient expects to be discharged to:: Skilled nursing facility                    Prior Function            PT Goals (current goals can now be found in the care plan section) Acute Rehab PT Goals Patient Stated Goal: get well Progress towards PT goals: Progressing toward goals    Frequency    Min 3X/week      PT Plan Current plan remains appropriate    Co-evaluation              AM-PAC PT "6 Clicks" Mobility   Outcome Measure  Help needed turning from your back to your side while in a flat bed without using bedrails?: A Little Help needed moving from lying on your back to sitting on the side of a flat bed without using bedrails?: A Lot Help needed moving to and from a bed to a chair (including a wheelchair)?: A Lot Help needed standing up from a chair using your arms (e.g., wheelchair or bedside chair)?: A Lot Help needed to walk in hospital room?: A Lot Help needed climbing 3-5 steps with a railing? : Total 6 Click Score: 12    End of Session Equipment Utilized During Treatment: Gait belt Activity Tolerance: Patient tolerated treatment well Patient left: in chair;with call bell/phone within reach;with chair alarm set Nurse Communication: Mobility status PT Visit Diagnosis: Difficulty in walking, not elsewhere classified (R26.2);Unsteadiness on feet (R26.81);Muscle weakness (generalized) (M62.81);History of falling (Z91.81)     Time: 4944-9675 PT Time Calculation (min) (ACUTE ONLY): 24 min  Charges:  $Gait Training: 8-22 mins $Therapeutic Activity: 8-22 mins                     Felecia Shelling  PTA Acute  Rehabilitation Services Pager       [phone] Office      [phone]

## 2020-02-13 NOTE — Progress Notes (Signed)
PROGRESS NOTE    [name]  HYI:[id number] DOB: 1961-04-30 DOA: 02/10/2020 PCP: [name], MD    Chief Complaint  Patient presents with  . Nausea  . Abdominal Pain    Brief Narrative:  HPI per Dr. Juanda Bond is a 59 y.o. male with medical history significant of ESLD on hospice from EtOH cirrhosis of liver.  Chronic thrombocytopenia, esophageal varices.  Pt still a full code as of hospice note just a couple of days ago.  Pt still resides at home.  Pt presents to the ED with c/o 6 episodes of NBNB emesis since yesterday morning.  Unable to take home med including lactulose and rifaximin.  Worsening confusion per son throughout day.  He reports that as the patient's confusion has been worsening that he is concerned that the patient needs a higher level of care than what he and his mother are able to provide for him at home. Both he and his mother works full-time. The patient has had an increasing number of falls over the last few weeks.He has also started to wonder more through the house,which include several flights of stairs.  Initially having abd pain apparently (though pain resolved by the time Im seeing him in ED).   ED Course: K 3.2 (replaced), Mg 1.5 (replaced).  Ammonia of 56, T bili 5.5, platelets 85 (chronic).  Assessment & Plan:   Principal Problem:   Hepatic encephalopathy (HCC) Active Problems:   Thrombocytopenia (HCC)   Alcoholic cirrhosis of liver with ascites (HCC)   Nausea & vomiting   Acute hepatic encephalopathy   Acute lower UTI   Frequent falls  1 acute hepatic encephalopathy Patient noted to have presented with worsening confusion with elevated ammonia levels.  Patient noted to have tolerated lactulose in the ED and had no further nausea or vomiting at that time.  Patient noted to have some episodes of nausea and vomiting over the past 1 to 2 days which has since improved.  Continue lactulose and Xifaxan.  Patient's confusion  improving as patient today alert to self place and knows it is 2021.  Unsure of the month.  Knows the president is Biden.  PT/OT recommending SNF.  Social work consulted.  2.  Nausea vomiting Questionable etiology.  Patient states no emesis today.  Feeling better.  Denies any abdominal pain.  Abdominal films negative for acute abnormalities.  Diet advanced to solid diet which patient is tolerating.  Continue antiemetics, supportive care.    3.  Staphylococcus hemolyticus UTI Urine cultures with greater than 100,000 colonies of Staphylococcus hemolyticus with intermediate resistance to Cipro, resistant to oxacillin, tetracycline, rifampin.  Discontinue IV Rocephin.  Placed on Macrobid day 1/5.    4.  Chronic thrombocytopenia Likely secondary to cirrhosis.  Patient with no overt bleeding.  Stable.  Follow.  5.  Alcoholic cirrhosis of the liver Patient noted to be in acute hepatic encephalopathy.  Currently on lactulose and Xifaxan.  Lasix on hold.  Follow.  6.  Hypokalemia/hypomagnesemia Patient noted to be on chronic potassium supplementation.  Patient diuretics however on hold.  Potassium at 4.2 this morning.  Magnesium at 1.9.  Follow.   7.  Depression Wellbutrin.  Prozac.   8.  Falls PT/OT.   DVT prophylaxis: SCDs Code Status: Full Family Communication: Updated patient.  No family at bedside. Disposition:   Status is: Observation    Dispo: The patient is from: Home  Anticipated d/c is to: To be determined.              Anticipated d/c date is: 02/14/2020              Patient currently still confused however slowly improving.  On IV antibiotics however urine cultures and sensitivities not sensitive to current antibiotics.  Antibiotics being changed.  Needs SNF.       Consultants:   None  Procedures:  None  Antimicrobials:  IV Rocephin 02/11/2020>>>>> 02/13/2020  Macrobid 02/13/2020   Subjective: Patient sitting up in chair eating his lunch.  Denies  chest pain.  No shortness of breath.  No nausea or vomiting.  Patient more alert oriented to self, place.  Knows it is 2021 however unsure of the month.  Knows [name] is the president.  Objective: Vitals:   02/12/20 1411 02/12/20 2026 02/12/20 2110 02/13/20 0505  BP: 119/83 116/74  104/65  Pulse: 69 69  70  Resp: 18 16  16   Temp: 97.9 F (36.6 C) 97.8 F (36.6 C)  97.9 F (36.6 C)  TempSrc: Oral Oral  Oral  SpO2: 100% 98%  97%  Weight:   69.4 kg   Height:   5\' 7"  (1.702 m)     Intake/Output Summary (Last 24 hours) at 02/13/2020 1229 Last data filed at 02/13/2020 1000 Gross per 24 hour  Intake 1335.34 ml  Output 850 ml  Net 485.34 ml   Filed Weights   02/12/20 2110  Weight: 69.4 kg    Examination:  General exam: NAD Respiratory system: Lungs clear to auscultation bilateral.  No wheezes no crackles no rhonchi. Cardiovascular system: Regular rate rhythm no murmurs rubs or gallops.  No JVD.  No lower extremity edema. Gastrointestinal system: Abdomen is soft, nontender, nondistended, positive bowel sounds.  No rebound.  No guarding.  Central nervous system: Alert. No focal neurological deficits.  Moving extremities spontaneously. Extremities: Symmetric 5 x 5 power. Skin: No rashes, lesions or ulcers Psychiatry: Judgement and insight appear normal. Mood & affect appropriate.     Data Reviewed: I have personally reviewed following labs and imaging studies  CBC: Recent Labs  Lab 02/10/20 2303 02/11/20 0625 02/12/20 0522 02/13/20 0458  WBC 7.8 8.1 6.3 6.9  NEUTROABS  --  5.8 3.8 4.5  HGB 13.3 11.8* 11.3* 11.7*  HCT 39.9 35.5* 33.2* 34.9*  MCV 93.0 91.5 91.5 93.1  PLT 85* 72* 67* 73*    Basic Metabolic Panel: Recent Labs  Lab 02/10/20 2303 02/11/20 0112 02/11/20 0625 02/12/20 0522 02/13/20 0458  NA 131*  --  131* 134* 135  K 3.2*  --  4.0 3.1* 4.2  CL 94*  --  98 100 105  CO2 27  --  24 26 25   GLUCOSE 103*  --  112* 93 107*  BUN 14  --  13 15 13     CREATININE 1.20  --  0.89 1.07 0.94  CALCIUM 8.3*  --  7.7* 8.4* 9.1  MG  --  1.5*  --  1.8 1.9  PHOS  --   --   --  4.6  --     GFR: Estimated Creatinine Clearance: 80.1 mL/min (by C-G formula based on SCr of 0.94 mg/dL).  Liver Function Tests: Recent Labs  Lab 02/10/20 2303 02/11/20 0625 02/12/20 0522 02/13/20 0458  AST 112*  113* 108* 74* 71*  ALT 73*  73* 57* 55* 52*  ALKPHOS 245*  239* 194* 192* 179*  BILITOT 5.5*  5.4*  5.0* 6.2* 4.9*  PROT 6.4*  6.6 5.3* 5.5* 5.6*  ALBUMIN 2.3*  2.4* 2.0* 2.0* 2.0*    CBG: No results for input(s): GLUCAP in the last 168 hours.   Recent Results (from the past 240 hour(s))  SARS CORONAVIRUS 2 (TAT 6-24 HRS) Nasopharyngeal Nasopharyngeal Swab     Status: None   Collection Time: 02/11/20 12:59 AM   Specimen: Nasopharyngeal Swab  Result Value Ref Range Status   SARS Coronavirus 2 NEGATIVE NEGATIVE Final    Comment: (NOTE) SARS-CoV-2 target nucleic acids are NOT DETECTED. The SARS-CoV-2 RNA is generally detectable in upper and lower respiratory specimens during the acute phase of infection. Negative results do not preclude SARS-CoV-2 infection, do not rule out co-infections with other pathogens, and should not be used as the sole basis for treatment or other patient management decisions. Negative results must be combined with clinical observations, patient history, and epidemiological information. The expected result is Negative. Fact Sheet for Patients: HairSlick.no Fact Sheet for Healthcare Providers: quierodirigir.com This test is not yet approved or cleared by the [place] FDA and  has been authorized for detection and/or diagnosis of SARS-CoV-2 by FDA under an Emergency Use Authorization (EUA). This EUA will remain  in effect (meaning this test can be used) for the duration of the COVID-19 declaration under Section 56 4(b)(1) of the Act, 21 U.S.C. section  360bbb-3(b)(1), unless the authorization is terminated or revoked sooner. Performed at [facility], [address], [place], [place] [address]   Urine culture     Status: Abnormal   Collection Time: 02/11/20  2:14 AM   Specimen: Urine, Random  Result Value Ref Range Status   Specimen Description   Final    URINE, RANDOM Performed at [facility], [address] [place], [place] [address]    Special Requests   Final    NONE Performed at [facility], [address], [place], [place] [address]    Culture >=100,000 COLONIES/mL STAPHYLOCOCCUS HAEMOLYTICUS (A)  Final   Report Status 02/13/2020 FINAL  Final   Organism ID, Bacteria STAPHYLOCOCCUS HAEMOLYTICUS (A)  Final      Susceptibility   Staphylococcus haemolyticus - MIC*    CIPROFLOXACIN 2 INTERMEDIATE Intermediate     GENTAMICIN <=0.5 SENSITIVE Sensitive     NITROFURANTOIN <=16 SENSITIVE Sensitive     OXACILLIN 0.5 RESISTANT Resistant     TETRACYCLINE >=16 RESISTANT Resistant     VANCOMYCIN 1 SENSITIVE Sensitive     TRIMETH/SULFA 20 SENSITIVE Sensitive     CLINDAMYCIN <=0.25 SENSITIVE Sensitive     RIFAMPIN >=32 RESISTANT Resistant     Inducible Clindamycin NEGATIVE Sensitive     * >=100,000 COLONIES/mL STAPHYLOCOCCUS HAEMOLYTICUS         Radiology Studies: DG Abd 2 Views  Result Date: 02/12/2020 CLINICAL DATA:  Nausea and vomiting. Altered mental status. EXAM: ABDOMEN - 2 VIEW COMPARISON:  Abdominal CT 04/26/2018 FINDINGS: No evidence of free air. No small bowel dilatation to suggest obstruction. There is slight gaseous distention of colon which is nonspecific. Multiple gallstones in the right upper quadrant. TIPS in the right upper quadrant. Calcifications in the pelvis are typical of phleboliths. No acute osseous abnormalities are seen. IMPRESSION: 1. Slight gaseous distention of colon which is nonspecific. No evidence of obstruction or free air. 2. Multiple gallstones.  TIPS  in the right upper quadrant. Electronically Signed   By: Narda Rutherford M.D.   On: 02/12/2020 18:44  Scheduled Meds: . aMILoride  20 mg Oral Daily  . buPROPion  100 mg Oral Daily  . FLUoxetine  40 mg Oral Daily  . gabapentin  600 mg Oral QHS  . lactulose  30 g Oral QID  . midodrine  5 mg Oral 3 times per day  . nitrofurantoin (macrocrystal-monohydrate)  100 mg Oral Q12H  . rifaximin  550 mg Oral BID   Continuous Infusions:    LOS: 1 day    Time spent: 35 mins    Irine Seal, MD [facility]   To contact the attending provider between 7A-7P or the covering provider during after hours 7P-7A, please log into the web site [email or web address] and access using universal [place] password for that web site. If you do not have the password, please call the hospital operator.  02/13/2020, 12:29 PM

## 2020-02-13 NOTE — Progress Notes (Signed)
Occupational Therapy Treatment Patient Details Name: Kenneth [name] MRN: [id number] DOB: [birth year] Today's Date: 02/13/2020    History of present illness Pt admitted with N/V, abdominal pain and AMS and dx with hepatic encephalopathy.  Pt with hx of polysubstance abuse, ETOH Cirrhosis of liver and ESLD   OT comments  Pt more alert this day!  Pt continues with flat affect but with not falling asleep during OT session  Follow Up Recommendations  SNF(depending on A at home)    Equipment Recommendations  None recommended by OT    Recommendations for Other Services      Precautions / Restrictions Precautions Precautions: Fall Restrictions Weight Bearing Restrictions: No       Mobility Bed Mobility Overal bed mobility: Needs Assistance Bed Mobility: Supine to Sit     Supine to sit: Min assist        Transfers Overall transfer level: Needs assistance Equipment used: Rolling walker (2 wheeled) Transfers: Sit to/from [facility] Sit to Stand: Min assist Stand pivot transfers: Min assist            Balance Overall balance assessment: Needs assistance Sitting-balance support: No upper extremity supported Sitting balance-Leahy Scale: Fair     Standing balance support: Bilateral upper extremity supported Standing balance-Leahy Scale: Poor                             ADL either performed or assessed with clinical judgement   ADL Overall ADL's : Needs assistance/impaired Eating/Feeding: Set up;Sitting   Grooming: Minimal assistance;Sitting   Upper Body Bathing: Minimal assistance;Sitting   Lower Body Bathing: Moderate assistance;Sit to/from stand;Cueing for compensatory techniques;Cueing for safety   Upper Body Dressing : Minimal assistance;Sitting   Lower Body Dressing: Moderate assistance;Sit to/from stand;Cueing for safety;Cueing for compensatory techniques;Cueing for sequencing   Toilet Transfer: Stand-pivot;Moderate  assistance;BSC   Toileting- Clothing Manipulation and Hygiene: Moderate assistance;Sit to/from stand;Cueing for safety;Cueing for compensatory techniques         General ADL Comments: pt much more awake this day.     Vision Patient Visual Report: No change from baseline     Perception     Praxis      Cognition Arousal/Alertness: Awake/alert Behavior During Therapy: Flat affect Overall Cognitive Status: No family/caregiver present to determine baseline cognitive functioning                                 General Comments: pt more conversational this day but continues with flat affect.  Per SW plan is SNF. Will update                   Pertinent Vitals/ Pain       Pain Assessment: No/denies pain  Home Living Family/patient expects to be discharged to:: Skilled nursing facility                                            Frequency  Min 2X/week        Progress Toward Goals  OT Goals(current goals can now be found in the care plan section)     Acute Rehab OT Goals Patient Stated Goal: get well OT Goal Formulation: With patient Time For Goal Achievement: 02/19/20 Potential to Achieve Goals: Good  Plan  Co-evaluation                 AM-PAC OT "6 Clicks" Daily Activity     Outcome Measure   Help from another person eating meals?: A Little Help from another person taking care of personal grooming?: A Little Help from another person toileting, which includes using toliet, bedpan, or urinal?: A Lot Help from another person bathing (including washing, rinsing, drying)?: A Lot Help from another person to put on and taking off regular upper body clothing?: A Little Help from another person to put on and taking off regular lower body clothing?: A Lot 6 Click Score: 15    End of Session Equipment Utilized During Treatment: Rolling walker  OT Visit Diagnosis: Unsteadiness on feet (R26.81);Other abnormalities of gait  and mobility (R26.89);Repeated falls (R29.6);Muscle weakness (generalized) (M62.81);History of falling (Z91.81)   Activity Tolerance Patient limited by lethargy   Patient Left in chair;with call bell/phone within reach;with chair alarm set   Nurse Communication Mobility status        Time: 7116-5790 OT Time Calculation (min): 17 min  Charges: OT General Charges $OT Visit: 1 Visit OT Treatments $Self Care/Home Management : 8-22 mins  Lise Auer, OT Acute Rehabilitation Services [phone] Office- [phone]      Dusty Raczkowski, Karin Golden D 02/13/2020, 1:26 PM

## 2020-02-13 NOTE — NC FL2 (Signed)
[place] LEVEL OF CARE SCREENING TOOL     IDENTIFICATION  Patient Name: Kenneth [name] Birthdate: Nov 23, 1960 Sex: male Admission Date (Current Location): 02/10/2020  [place] and [place] Number:  Kathleen Argue [id number] [place] and Address:  [facility],  [place], [place]      Provider Number: [id number]  Attending Physician Name and Address:  Eugenie Filler, MD  Relative Name and Phone Number:  Criston Chancellor @ [phone]    Current Level of Care: SNF Recommended Level of Care: [place] Prior Approval Number:    Date Approved/Denied:   PASRR Number: [id number]  Discharge Plan:      Current Diagnoses: Patient Active Problem List   Diagnosis Date Noted  . Acute lower UTI 02/12/2020  . Frequent falls   . Nausea & vomiting 02/11/2020  . Acute hepatic encephalopathy 02/11/2020  . Acute liver failure with hepatic coma ([place])   . Malnutrition of moderate degree 08/17/2018  . Accelerated junctional rhythm 08/17/2018  . Hypokalemia 08/16/2018  . Hypomagnesemia 08/16/2018  . Hepatic encephalopathy ([place]) 08/15/2018  . Peripheral neuropathy 08/02/2018  . Ascites due to alcoholic cirrhosis ([place]) 72/06/4708  . Depression 12/18/2016  . Testosterone deficiency in male 08/11/2016  . Inguinal hernia 02/28/2016  . Rectal bleed 01/24/2016  . Umbilical hernia 62/83/6629  . Ascites 01/24/2016  . Tobacco abuse 01/24/2016  . Gynecomastia, male 01/24/2016  . Esophageal varices ([place]) 09/02/2014  . Portal hypertension with esophageal varices (HCC) 09/02/2014  . Hiatal hernia 09/02/2014  . Leukopenia 09/02/2014  . Thrombocytopenia ([place]) 09/02/2014  . Alcoholic cirrhosis of liver with ascites (HCC)     Orientation RESPIRATION BLADDER Height & Weight     Self  Normal External catheter Weight: 153 lb (69.4 kg)(per PC note) Height:  5\' 7"  (170.2 cm)  BEHAVIORAL SYMPTOMS/MOOD NEUROLOGICAL BOWEL NUTRITION STATUS   Incontinent Diet(Regular)  AMBULATORY STATUS COMMUNICATION OF NEEDS Skin   Extensive Assist Verbally Normal                       Personal Care Assistance Level of Assistance  Bathing, Dressing Bathing Assistance: Limited assistance   Dressing Assistance: Limited assistance     Functional Limitations Info             SPECIAL CARE FACTORS FREQUENCY  PT (By licensed PT), OT (By licensed OT)     PT Frequency: 5x/wk OT Frequency: 5x/wk            Contractures Contractures Info: Not present    Additional Factors Info  Code Status, Allergies, Psychotropic Code Status Info: Full Allergies Info: see MAR Psychotropic Info: see MAR         Current Medications (02/13/2020):  This is the current hospital active medication list Current Facility-Administered Medications  Medication Dose Route Frequency Provider Last Rate Last Admin  . aMILoride (MIDAMOR) tablet 20 mg  20 mg Oral Daily Jennette Kettle M, DO   20 mg at 02/13/20 0900  . buPROPion (WELLBUTRIN SR) 12 hr tablet 100 mg  100 mg Oral Daily Jennette Kettle M, DO   100 mg at 02/13/20 0900  . FLUoxetine (PROZAC) capsule 40 mg  40 mg Oral Daily Jennette Kettle M, DO   40 mg at 02/13/20 0900  . gabapentin (NEURONTIN) capsule 600 mg  600 mg Oral QHS Jennette Kettle M, DO   600 mg at 02/12/20 2116  . lactulose (CHRONULAC) 10 GM/15ML solution 30 g  30 g Oral QID  Hillary Bow, DO   30 g at 02/13/20 0900  . midodrine (PROAMATINE) tablet 5 mg  5 mg Oral 3 times per day Hillary Bow, DO   5 mg at 02/13/20 0900  . nitrofurantoin (macrocrystal-monohydrate) (MACROBID) capsule 100 mg  100 mg Oral Q12H Rodolph Bong, MD   100 mg at 02/13/20 0900  . ondansetron (ZOFRAN) tablet 4 mg  4 mg Oral Q6H PRN Hillary Bow, DO   4 mg at 02/11/20 1204   Or  . ondansetron (ZOFRAN) injection 4 mg  4 mg Intravenous Q6H PRN Hillary Bow, DO   4 mg at 02/11/20 2328  . ondansetron (ZOFRAN) tablet 4 mg  4 mg Oral Q8H PRN Hillary Bow, DO      . rifaximin Burman Blacksmith) tablet 550 mg  550 mg Oral BID Lyda Perone M, DO   550 mg at 02/13/20 0900     Discharge Medications: Please see discharge summary for a list of discharge medications.  Relevant Imaging Results:  Relevant Lab Results:   Additional Information SS#: [id number]  Amada Jupiter, LCSW

## 2020-02-13 NOTE — TOC Initial Note (Signed)
Transition of Care [facility]) - Initial/Assessment Note    Patient Details  Name: Kenneth [name] MRN: [id number] Date of Birth: 1961/03/23  Transition of Care (TOC) CM/SW Contact:    Lennart Pall, LCSW Phone Number: 02/13/2020, 9:57 AM  Clinical Narrative:     Met with patient this morning to introduce myself/ role, however, pt is not oriented beyond self.  He is calm and courteous.  He is agreeable to me speaking with his son, Kenneth [name].   Son contacted and very pleasant.  Has been working with [facility] PTA on plans for SNF.  States he still wishes to pursue SNF as family cannot meet his care needs any longer.  Have begun bed search process.              Expected Discharge Plan: Skilled Nursing Facility Barriers to Discharge: Continued Medical Work up   Patient Goals and CMS Choice Patient states their goals for this hospitalization and ongoing recovery are:: pt not oriented CMS Medicare.gov Compare Post Acute Care list provided to:: Patient Represenative (must comment)(son, Kenneth [name]) Choice offered to / list presented to : Adult Children  Expected Discharge Plan and Services Expected Discharge Plan: [place] In-house Referral: Clinical Social Work   Post Acute Care Choice: [place] Living arrangements for the past 2 months: Single Family Home                 DME Arranged: N/A DME Agency: NA       HH Arranged: NA HH Agency: NA        Prior Living Arrangements/Services Living arrangements for the past 2 months: [place] Lives with:: Adult Children Patient language and need for interpreter reviewed:: Yes        Need for Family Participation in Patient Care: No (Comment)(plan for SNF) Care giver support system in place?: No (comment) Current home services: Other (comment)([facility] - Palliative) Criminal Activity/Legal Involvement Pertinent to Current Situation/Hospitalization: No - Comment as needed  Activities of Daily Living Home  Assistive Devices/Equipment: Cane (specify quad or straight), Walker (specify type) ADL Screening (condition at time of admission) Patient's cognitive ability adequate to safely complete daily activities?: Yes Is the patient deaf or have difficulty hearing?: No Does the patient have difficulty seeing, even when wearing glasses/contacts?: No Does the patient have difficulty concentrating, remembering, or making decisions?: Yes Patient able to express need for assistance with ADLs?: Yes Does the patient have difficulty dressing or bathing?: No Independently performs ADLs?: Yes (appropriate for developmental age) Does the patient have difficulty walking or climbing stairs?: Yes Weakness of Legs: Both Weakness of Arms/Hands: None  Permission Sought/Granted Permission sought to share information with : Family Supports, Chartered certified accountant granted to share information with : Yes, Verbal Permission Granted  Share Information with NAME: Kenneth [name]     Permission granted to share info w Relationship: son  Permission granted to share info w Contact Information: [phone]  Emotional Assessment Appearance:: Appears older than stated age Attitude/Demeanor/Rapport: Gracious Affect (typically observed): Calm Orientation: : Oriented to Self Alcohol / Substance Use: Alcohol Use Psych Involvement: No (comment)  Admission diagnosis:  Hepatic encephalopathy ([place]) [K56.25] Alcoholic cirrhosis of liver without ascites ([place]) [K70.30] Frequent falls [R29.6] Acute hepatic encephalopathy [K72.00] Patient Active Problem List   Diagnosis Date Noted  . Acute lower UTI 02/12/2020  . Frequent falls   . Nausea & vomiting 02/11/2020  . Acute hepatic encephalopathy 02/11/2020  . Acute liver failure with hepatic coma ([place])   .  Malnutrition of moderate degree 08/17/2018  . Accelerated junctional rhythm 08/17/2018  . Hypokalemia 08/16/2018  . Hypomagnesemia 08/16/2018  . Hepatic  encephalopathy ([place]) 08/15/2018  . Peripheral neuropathy 08/02/2018  . Ascites due to alcoholic cirrhosis ([place]) 33/00/7622  . Depression 12/18/2016  . Testosterone deficiency in male 08/11/2016  . Inguinal hernia 02/28/2016  . Rectal bleed 01/24/2016  . Umbilical hernia 63/33/5456  . Ascites 01/24/2016  . Tobacco abuse 01/24/2016  . Gynecomastia, male 01/24/2016  . Esophageal varices ([place]) 09/02/2014  . Portal hypertension with esophageal varices (HCC) 09/02/2014  . Hiatal hernia 09/02/2014  . Leukopenia 09/02/2014  . Thrombocytopenia ([place]) 09/02/2014  . Alcoholic cirrhosis of liver with ascites ([place])    PCP:  Charlott Rakes, MD Pharmacy:   [id number] [place] [place] [address]Phone: [phone]: [phone] [facility] [address] [place] [address]Phone: [phone]: [phone]    Social Determinants of Health (SDOH) Interventions    Readmission Risk Interventions Readmission Risk Prevention Plan 02/13/2020  Post Dischage Appt Not Complete  Appt Comments plan for SNF  Medication Screening Complete  Transportation Screening Complete  Some recent data might be hidden

## 2020-02-14 ENCOUNTER — Ambulatory Visit: Payer: Medicaid Other | Admitting: Family Medicine

## 2020-02-14 LAB — BASIC METABOLIC PANEL
Anion gap: 5 (ref 5–15)
BUN: 12 mg/dL (ref 6–20)
CO2: 24 mmol/L (ref 22–32)
Calcium: 8.6 mg/dL — ABNORMAL LOW (ref 8.9–10.3)
Chloride: 101 mmol/L (ref 98–111)
Creatinine, Ser: 0.98 mg/dL (ref 0.61–1.24)
GFR calc Af Amer: >60 mL/min (ref >60)
GFR calc non Af Amer: >60 mL/min (ref >60)
Glucose, Bld: 102 mg/dL — ABNORMAL HIGH (ref 70–99)
Potassium: 4 mmol/L (ref 3.5–5.1)
Sodium: 130 mmol/L — ABNORMAL LOW (ref 135–145)

## 2020-02-14 LAB — CBC
HCT: 34.8 % — ABNORMAL LOW (ref 39.0–52.0)
Hemoglobin: 11.6 g/dL — ABNORMAL LOW (ref 13.0–17.0)
MCH: 31.2 pg (ref 26.0–34.0)
MCHC: 33.3 g/dL (ref 30.0–36.0)
MCV: 93.5 fL (ref 80.0–100.0)
Platelets: 73 10*3/uL — ABNORMAL LOW (ref 150–400)
RBC: 3.72 MIL/uL — ABNORMAL LOW (ref 4.22–5.81)
RDW: 15.7 % — ABNORMAL HIGH (ref 11.5–15.5)
WBC: 8.2 10*3/uL (ref 4.0–10.5)
nRBC: 0 % (ref 0.0–0.2)

## 2020-02-14 MED ORDER — NITROFURANTOIN MONOHYD MACRO 100 MG PO CAPS: 100.0000 mg | ORAL_CAPSULE | Freq: Two times a day (BID) | ORAL | 0 refills | Status: AC

## 2020-02-14 MED ORDER — POTASSIUM CHLORIDE CRYS ER 20 MEQ PO TBCR: 40.0000 meq | EXTENDED_RELEASE_TABLET | Freq: Every day | ORAL | 1 refills | Status: AC

## 2020-02-14 MED ORDER — FUROSEMIDE 80 MG PO TABS: 80.0000 mg | ORAL_TABLET | Freq: Every day | ORAL | 3 refills | Status: AC

## 2020-02-14 NOTE — Progress Notes (Addendum)
Attempted to call report at 1753 to [facility] at phone number [phone].    No answer on nursing hall. Will attempt again in .    [place], Charity fundraiser . . . . . Attempted second time at 1838, with no answer at front desk or nursing station.   Still awaiting [facility] transport at this time.

## 2020-02-14 NOTE — TOC Transition Note (Signed)
Transition of Care [facility]) - CM/SW Discharge Note   Patient Details  Name: Kenneth [name] MRN: [id number] Date of Birth: 05-01-1961  Transition of Care [facility]) CM/SW Contact:  Amada Jupiter, LCSW Phone Number: 02/14/2020, 3:57 PM   Clinical Narrative:  Have received SNF bed offer from [facility] - son has accepted and plan to transfer to facility today.     Final next level of care: Skilled Nursing Facility Barriers to Discharge: Barriers Resolved   Patient Goals and CMS Choice Patient states their goals for this hospitalization and ongoing recovery are:: pt not oriented CMS Medicare.gov Compare Post Acute Care list provided to:: Patient Represenative (must comment)(son, [name]) Choice offered to / list presented to : Adult Children  Discharge Placement              Patient chooses bed at: [facility] Patient to be transferred to facility by: [facility] Name of family member notified: son, Laszlo Ellerby Patient and family notified of of transfer: 02/14/20  Discharge Plan and Services In-house Referral: Clinical Social Work   Post Acute Care Choice: Skilled Nursing Facility          DME Arranged: N/A DME Agency: NA       HH Arranged: NA HH Agency: NA        Social Determinants of Health (SDOH) Interventions     Readmission Risk Interventions Readmission Risk Prevention Plan 02/13/2020  Post Dischage Appt Not Complete  Appt Comments plan for SNF  Medication Screening Complete  Transportation Screening Complete  Some recent data might be hidden

## 2020-02-14 NOTE — Discharge Summary (Signed)
Physician Discharge Summary  Kenneth [name] BTD:[id number] DOB: 11-03-1960 DOA: 02/10/2020  PCP: [name], MD  Admit date: 02/10/2020 Discharge date: 02/14/2020  Time spent: 55 minutes  Recommendations for Outpatient Follow-up:  1. Patient be discharged to a skilled nursing facility.  Follow-up with MD at skilled nursing facility.  Patient will need a basic metabolic profile done in 1 week to follow-up on electrolytes and renal function.   Discharge Diagnoses:  Principal Problem:   Hepatic encephalopathy (HCC) Active Problems:   Thrombocytopenia (HCC)   Alcoholic cirrhosis of liver with ascites (HCC)   Nausea & vomiting   Acute hepatic encephalopathy   Acute lower UTI   Frequent falls   Discharge Condition: Stable and improved  Diet recommendation: Heart healthy  Filed Weights   02/12/20 2110  Weight: 69.4 kg    History of present illness:  HPI per Dr. Juanda Bond is a 59 y.o. male with medical history significant of ESLD on hospice from EtOH cirrhosis of liver.  Chronic thrombocytopenia, esophageal varices.  Pt still a full code as of hospice note just a couple of days ago.  Pt still resides at home.  Pt presented to the ED with c/o 6 episodes of NBNB emesis since yesterday morning.  Unable to take home med including lactulose and rifaximin.  Worsening confusion per son throughout day.  He reported that as the patient's confusion had been worsening that he is concerned that the patient needs a higher level of care than what he and his mother are able to provide for him at home. Both he and his mother works full-time. The patient has had an increasing number of falls over the last few weeks.He has also started to wonder more through the house,which include several flights of stairs.  Initially having abd pain apparently (though pain resolved by the time Im seeing him in ED).   ED Course: K 3.2 (replaced), Mg 1.5 (replaced).  Ammonia of 56, T bili 5.5,  platelets 85 (chronic).  Hospital Course:  1 acute hepatic encephalopathy Patient noted to have presented with worsening confusion with elevated ammonia levels.  Patient noted to have tolerated lactulose in the ED and had no further nausea or vomiting at that time.  Patient noted to have some episodes of nausea and vomiting over the past 1 to 2 days which improved.  Patient initially placed on clear liquids.  Patient maintained on home regimen of lactulose and Xifaxan.  Patient's encephalopathy improved on a daily basis and patient was likely close to baseline by day of discharge.  Patient will be discharged to a skilled nursing facility.  Outpatient follow-up.   2.  Nausea vomiting Questionable etiology.  Patient improved clinically during the hospitalization.  Abdominal films which were done were negative for any acute abnormalities.  Patient was started on clear liquid diet and diet advanced to a solid diet which he tolerated.  Patient's nausea and vomiting had resolved by day of discharge.  3.  Staphylococcus hemolyticus UTI Urine cultures with > 100,000 colonies of Staphylococcus hemolyticus with intermediate resistance to Cipro, resistant to oxacillin, tetracycline, rifampin.    Patient initially placed on IV Rocephin however once cultures and sensitivities had resulted patient was transitioned to oral Macrobid which he tolerated.  Patient be discharged on 4 more days of Macrobid to complete a 5-day course of treatment.    4.  Chronic thrombocytopenia Likely secondary to cirrhosis.  Patient with no overt bleeding.   5.  Alcoholic cirrhosis of  the liver Patient noted to be in acute hepatic encephalopathy.    Patient placed back on home regimen of lactulose and Xifaxan and improved clinically in terms of his hepatic encephalopathy.  Patient's Lasix was held and will be resumed on discharge.  Outpatient follow-up.  6.  Hypokalemia/hypomagnesemia Patient noted to be on chronic potassium  supplementation.  Patient diuretics were held throughout the hospitalization.  Patient's potassium was repleted.  Magnesium repleted.  Patient's diuretics and daily potassium supplementation will be resumed on discharge.  Outpatient follow-up.   7.  Depression Patient maintained on home regimen of Wellbutrin and Prozac.  Outpatient follow-up.   8.  Falls PT/OT assessed patient and recommended SNF.  Patient was discharged to a SNF.Marland Kitchen  Procedures:  None  Consultations:  None  Discharge Exam: Vitals:   02/13/20 2221 02/14/20 0524  BP: 100/75 106/68  Pulse: 68 71  Resp: 20 18  Temp: 98 F (36.7 C) 98 F (36.7 C)  SpO2: 98% 97%    General: NAD Cardiovascular: RRR Respiratory: CTAB  Discharge Instructions   Discharge Instructions    Diet - low sodium heart healthy   Complete by: As directed    Increase activity slowly   Complete by: As directed      Allergies as of 02/14/2020      Reactions   Latex Swelling, Rash      Medication List    TAKE these medications   aMILoride 5 MG tablet Commonly known as: MIDAMOR TAKE 4 TABLETS BY MOUTH EVERY DAY   buPROPion 100 MG 12 hr tablet Commonly known as: WELLBUTRIN SR TAKE 1 TABLET BY MOUTH EVERY DAY   FLUoxetine 40 MG capsule Commonly known as: PROZAC TAKE 1 CAPSULE BY MOUTH EVERY DAY   furosemide 80 MG tablet Commonly known as: LASIX Take 1 tablet (80 mg total) by mouth daily. Start taking on: February 15, 2020   gabapentin 300 MG capsule Commonly known as: NEURONTIN TAKE 2 CAPSULES (600 MG TOTAL) BY MOUTH AT BEDTIME.   lactulose 10 GM/15ML solution Commonly known as: CHRONULAC Take 45 mLs (30 g total) by mouth 4 (four) times daily. What changed:   when to take this  reasons to take this   midodrine 5 MG tablet Commonly known as: PROAMATINE Take 1 tablet (5 mg total) by mouth 3 (three) times daily. 9a, 2p, 9p pt has been taking a whole tablet 3 times a day   nitrofurantoin (macrocrystal-monohydrate)  100 MG capsule Commonly known as: MACROBID Take 1 capsule (100 mg total) by mouth every 12 (twelve) hours for 4 days.   ondansetron 4 MG tablet Commonly known as: ZOFRAN Take 1 tablet (4 mg total) by mouth every 8 (eight) hours as needed for nausea or vomiting.   potassium chloride SA 20 MEQ tablet Commonly known as: Klor-Con M20 Take 2 tablets (40 mEq total) by mouth daily. Start taking on: February 15, 2020   rifaximin 550 MG Tabs tablet Commonly known as: XIFAXAN Take 1 tablet (550 mg total) by mouth 2 (two) times daily.      Allergies  Allergen Reactions  . Latex Swelling and Rash   Follow-up Information    MD AT SNF Follow up.            The results of significant diagnostics from this hospitalization (including imaging, microbiology, ancillary and laboratory) are listed below for reference.    Significant Diagnostic Studies: DG Abd 2 Views  Result Date: 02/12/2020 CLINICAL DATA:  Nausea and vomiting. Altered mental  status. EXAM: ABDOMEN - 2 VIEW COMPARISON:  Abdominal CT 04/26/2018 FINDINGS: No evidence of free air. No small bowel dilatation to suggest obstruction. There is slight gaseous distention of colon which is nonspecific. Multiple gallstones in the right upper quadrant. TIPS in the right upper quadrant. Calcifications in the pelvis are typical of phleboliths. No acute osseous abnormalities are seen. IMPRESSION: 1. Slight gaseous distention of colon which is nonspecific. No evidence of obstruction or free air. 2. Multiple gallstones.  TIPS in the right upper quadrant. Electronically Signed   By: Narda Rutherford M.D.   On: 02/12/2020 18:44    Microbiology: Recent Results (from the past 240 hour(s))  SARS CORONAVIRUS 2 (TAT 6-24 HRS) Nasopharyngeal Nasopharyngeal Swab     Status: None   Collection Time: 02/11/20 12:59 AM   Specimen: Nasopharyngeal Swab  Result Value Ref Range Status   SARS Coronavirus 2 NEGATIVE NEGATIVE Final    Comment: (NOTE) SARS-CoV-2  target nucleic acids are NOT DETECTED. The SARS-CoV-2 RNA is generally detectable in upper and lower respiratory specimens during the acute phase of infection. Negative results do not preclude SARS-CoV-2 infection, do not rule out co-infections with other pathogens, and should not be used as the sole basis for treatment or other patient management decisions. Negative results must be combined with clinical observations, patient history, and epidemiological information. The expected result is Negative. Fact Sheet for Patients: HairSlick.no Fact Sheet for Healthcare Providers: quierodirigir.com This test is not yet approved or cleared by the [place] FDA and  has been authorized for detection and/or diagnosis of SARS-CoV-2 by FDA under an Emergency Use Authorization (EUA). This EUA will remain  in effect (meaning this test can be used) for the duration of the COVID-19 declaration under Section 56 4(b)(1) of the Act, 21 U.S.C. section 360bbb-3(b)(1), unless the authorization is terminated or revoked sooner. Performed at [facility], [address], [place], [place] [address]   Urine culture     Status: Abnormal   Collection Time: 02/11/20  2:14 AM   Specimen: Urine, Random  Result Value Ref Range Status   Specimen Description   Final    URINE, RANDOM Performed at [facility], [address] [place], [place] [address]    Special Requests   Final    NONE Performed at [facility], [address] [place], [place] [address]    Culture >=100,000 COLONIES/mL STAPHYLOCOCCUS HAEMOLYTICUS (A)  Final   Report Status 02/13/2020 FINAL  Final   Organism ID, Bacteria STAPHYLOCOCCUS HAEMOLYTICUS (A)  Final      Susceptibility   Staphylococcus haemolyticus - MIC*    CIPROFLOXACIN 2 INTERMEDIATE Intermediate     GENTAMICIN <=0.5 SENSITIVE Sensitive     NITROFURANTOIN <=16 SENSITIVE Sensitive      OXACILLIN 0.5 RESISTANT Resistant     TETRACYCLINE >=16 RESISTANT Resistant     VANCOMYCIN 1 SENSITIVE Sensitive     TRIMETH/SULFA 20 SENSITIVE Sensitive     CLINDAMYCIN <=0.25 SENSITIVE Sensitive     RIFAMPIN >=32 RESISTANT Resistant     Inducible Clindamycin NEGATIVE Sensitive     * >=100,000 COLONIES/mL STAPHYLOCOCCUS HAEMOLYTICUS     Labs: Basic Metabolic Panel: Recent Labs  Lab 02/10/20 2303 02/11/20 0112 02/11/20 0625 02/12/20 0522 02/13/20 0458 02/14/20 0445  NA 131*  --  131* 134* 135 130*  K 3.2*  --  4.0 3.1* 4.2 4.0  CL 94*  --  98 100 105 101  CO2 27  --  24  26 25 24   GLUCOSE 103*  --  112* 93 107* 102*  BUN 14  --  13 15 13 12   CREATININE 1.20  --  0.89 1.07 0.94 0.98  CALCIUM 8.3*  --  7.7* 8.4* 9.1 8.6*  MG  --  1.5*  --  1.8 1.9  --   PHOS  --   --   --  4.6  --   --    Liver Function Tests: Recent Labs  Lab 02/10/20 2303 02/11/20 0625 02/12/20 0522 02/13/20 0458  AST 112*  113* 108* 74* 71*  ALT 73*  73* 57* 55* 52*  ALKPHOS 245*  239* 194* 192* 179*  BILITOT 5.5*  5.4* 5.0* 6.2* 4.9*  PROT 6.4*  6.6 5.3* 5.5* 5.6*  ALBUMIN 2.3*  2.4* 2.0* 2.0* 2.0*   Recent Labs  Lab 02/10/20 2303  LIPASE 69*   Recent Labs  Lab 02/11/20 0112 02/11/20 0625  AMMONIA 56* 35   CBC: Recent Labs  Lab 02/10/20 2303 02/11/20 0625 02/12/20 0522 02/13/20 0458 02/14/20 0445  WBC 7.8 8.1 6.3 6.9 8.2  NEUTROABS  --  5.8 3.8 4.5  --   HGB 13.3 11.8* 11.3* 11.7* 11.6*  HCT 39.9 35.5* 33.2* 34.9* 34.8*  MCV 93.0 91.5 91.5 93.1 93.5  PLT 85* 72* 67* 73* 73*   Cardiac Enzymes: No results for input(s): CKTOTAL, CKMB, CKMBINDEX, TROPONINI in the last 168 hours. BNP: BNP (last 3 results) No results for input(s): BNP in the last 8760 hours.  ProBNP (last 3 results) No results for input(s): PROBNP in the last 8760 hours.  CBG: No results for input(s): GLUCAP in the last 168 hours.     Signed:  02/15/20 MD.  [facility] 02/14/2020, 12:54 PM

## 2020-02-14 NOTE — Progress Notes (Signed)
Attempted to call report at [facility] . No answer from the nusring hall but was able to leave a voice message and a call back number. We will try to call again later.

## 2020-02-24 ENCOUNTER — Emergency Department (HOSPITAL_COMMUNITY): Payer: Medicaid Other

## 2020-02-24 ENCOUNTER — Other Ambulatory Visit: Payer: Self-pay

## 2020-02-24 ENCOUNTER — Encounter (HOSPITAL_COMMUNITY): Payer: Self-pay

## 2020-02-24 ENCOUNTER — Emergency Department (HOSPITAL_COMMUNITY)
Admission: EM | Admit: 2020-02-24 | Discharge: 2020-02-24 | Disposition: A | Discharge status: Home / Self Care | Payer: Medicaid Other | Attending: Emergency Medicine | Admitting: Emergency Medicine

## 2020-02-24 DIAGNOSIS — Z91040 Latex allergy status: Secondary | ICD-10-CM | POA: Diagnosis not present

## 2020-02-24 DIAGNOSIS — K7290 Hepatic failure, unspecified without coma: Secondary | ICD-10-CM

## 2020-02-24 DIAGNOSIS — K7682 Hepatic encephalopathy: Secondary | ICD-10-CM

## 2020-02-24 DIAGNOSIS — Z79899 Other long term (current) drug therapy: Secondary | ICD-10-CM | POA: Insufficient documentation

## 2020-02-24 DIAGNOSIS — Z20822 Contact with and (suspected) exposure to covid-19: Secondary | ICD-10-CM | POA: Insufficient documentation

## 2020-02-24 DIAGNOSIS — R410 Disorientation, unspecified: Secondary | ICD-10-CM

## 2020-02-24 DIAGNOSIS — F17210 Nicotine dependence, cigarettes, uncomplicated: Secondary | ICD-10-CM | POA: Diagnosis not present

## 2020-02-24 DIAGNOSIS — R4182 Altered mental status, unspecified: Secondary | ICD-10-CM | POA: Diagnosis present

## 2020-02-24 LAB — PROTIME-INR
INR: 1.7 — ABNORMAL HIGH (ref 0.8–1.2)
Prothrombin Time: 19.7 seconds — ABNORMAL HIGH (ref 11.4–15.2)

## 2020-02-24 LAB — COMPREHENSIVE METABOLIC PANEL
ALT: 62 U/L — ABNORMAL HIGH (ref 0–44)
AST: 105 U/L — ABNORMAL HIGH (ref 15–41)
Albumin: 2.2 g/dL — ABNORMAL LOW (ref 3.5–5.0)
Alkaline Phosphatase: 203 U/L — ABNORMAL HIGH (ref 38–126)
Anion gap: 10 (ref 5–15)
BUN: 19 mg/dL (ref 6–20)
CO2: 26 mmol/L (ref 22–32)
Calcium: 9 mg/dL (ref 8.9–10.3)
Chloride: 97 mmol/L — ABNORMAL LOW (ref 98–111)
Creatinine, Ser: 1.24 mg/dL (ref 0.61–1.24)
GFR calc Af Amer: >60 mL/min (ref >60)
GFR calc non Af Amer: >60 mL/min (ref >60)
Glucose, Bld: 106 mg/dL — ABNORMAL HIGH (ref 70–99)
Potassium: 4.4 mmol/L (ref 3.5–5.1)
Sodium: 133 mmol/L — ABNORMAL LOW (ref 135–145)
Total Bilirubin: 5.5 mg/dL — ABNORMAL HIGH (ref 0.3–1.2)
Total Protein: 6.2 g/dL — ABNORMAL LOW (ref 6.5–8.1)

## 2020-02-24 LAB — URINALYSIS, COMPLETE (UACMP) WITH MICROSCOPIC
Bacteria, UA: NONE SEEN
Bilirubin Urine: NEGATIVE
Glucose, UA: NEGATIVE mg/dL
Hgb urine dipstick: NEGATIVE
Ketones, ur: 5 mg/dL — AB
Leukocytes,Ua: NEGATIVE
Nitrite: NEGATIVE
Protein, ur: NEGATIVE mg/dL
Specific Gravity, Urine: 1.01 (ref 1.005–1.030)
pH: 7 (ref 5.0–8.0)

## 2020-02-24 LAB — CBC WITH DIFFERENTIAL/PLATELET
Abs Immature Granulocytes: 0.08 10*3/uL — ABNORMAL HIGH (ref 0.00–0.07)
Basophils Absolute: 0 10*3/uL (ref 0.0–0.1)
Basophils Relative: 0 %
Eosinophils Absolute: 0 10*3/uL (ref 0.0–0.5)
Eosinophils Relative: 0 %
HCT: 37 % — ABNORMAL LOW (ref 39.0–52.0)
Hemoglobin: 12.6 g/dL — ABNORMAL LOW (ref 13.0–17.0)
Immature Granulocytes: 1 %
Lymphocytes Relative: 5 %
Lymphs Abs: 0.6 10*3/uL — ABNORMAL LOW (ref 0.7–4.0)
MCH: 32.4 pg (ref 26.0–34.0)
MCHC: 34.1 g/dL (ref 30.0–36.0)
MCV: 95.1 fL (ref 80.0–100.0)
Monocytes Absolute: 1.2 10*3/uL — ABNORMAL HIGH (ref 0.1–1.0)
Monocytes Relative: 10 %
Neutro Abs: 10.3 10*3/uL — ABNORMAL HIGH (ref 1.7–7.7)
Neutrophils Relative %: 84 %
Platelets: 87 10*3/uL — ABNORMAL LOW (ref 150–400)
RBC: 3.89 MIL/uL — ABNORMAL LOW (ref 4.22–5.81)
RDW: 18 % — ABNORMAL HIGH (ref 11.5–15.5)
WBC: 12.1 10*3/uL — ABNORMAL HIGH (ref 4.0–10.5)
nRBC: 0 % (ref 0.0–0.2)

## 2020-02-24 LAB — AMMONIA: Ammonia: 56 umol/L — ABNORMAL HIGH (ref 9–35)

## 2020-02-24 LAB — RAPID URINE DRUG SCREEN, HOSP PERFORMED
Amphetamines: NOT DETECTED
Barbiturates: NOT DETECTED
Benzodiazepines: NOT DETECTED
Cocaine: NOT DETECTED
Opiates: NOT DETECTED
Tetrahydrocannabinol: NOT DETECTED

## 2020-02-24 LAB — ETHANOL: Alcohol, Ethyl (B): <10 mg/dL (ref <10)

## 2020-02-24 LAB — CBG MONITORING, ED: Glucose-Capillary: 94 mg/dL (ref 70–99)

## 2020-02-24 LAB — RESPIRATORY PANEL BY RT PCR (FLU A&B, COVID)
Influenza A by PCR: NEGATIVE
Influenza B by PCR: NEGATIVE
SARS Coronavirus 2 by RT PCR: NEGATIVE

## 2020-02-24 MED ORDER — LACTULOSE 10 GM/15ML PO SOLN: 20.0000 g | Freq: Once | ORAL | Status: DC

## 2020-02-24 MED ORDER — LACTULOSE 10 GM/15ML PO SOLN
45.0000 g | Freq: Once | ORAL | Status: AC
  Administered 2020-02-24: 18:00:00 45 g via ORAL
  Filled 2020-02-24: qty 90

## 2020-02-24 MED ORDER — SODIUM CHLORIDE 0.9 % IV SOLN: INTRAVENOUS | Status: DC

## 2020-02-24 MED ORDER — LACTULOSE 10 GM/15ML PO SOLN: 40.0000 g | Freq: Four times a day (QID) | ORAL | 6 refills | Status: AC

## 2020-02-24 NOTE — ED Notes (Signed)
Pt cleaned and provided with peri care due to being found soiled in urine. New linens provided. Call bell within reach.

## 2020-02-24 NOTE — ED Provider Notes (Signed)
[facility]-EMERGENCY DEPT Provider Note   CSN: [id number] Arrival date & time: 02/24/20  1422     History Chief Complaint  Patient presents with  . Abnormal Lab    Kenneth [name] is a 59 y.o. male.  HPI   Patient presented to the emergency room for evaluation of altered mental status.  Patient has a history of alcohol abuse and chronic liver disease.  He also has a history of chronic encephalopathy.  Patient is a resident of [facility].  According to the EMS report the patient has been more confused recently.  Apparently they noted increased ammonia levels. In the ED, the patient is confused but he will answer questions.  He denies having any specific pain.  He is not aware of why he is in the emergency room or circumstances.  Past Medical History:  Diagnosis Date  . Alcohol abuse   . Anxiety   . Arthritis    "never diagnosis" (09/02/2018)  . Ascites   . Blood clot in vein   . Cirrhosis (HCC)   . Depression   . Dyspnea    Due to ascites  . Esophageal varices (HCC) 09/02/2014  . H/O [facility] spotted fever    age 31 or 75  . Hernia, inguinal    right  . Hiatal hernia 09/02/2014  . Liver cirrhosis (HCC)   . Polysubstance abuse (HCC)   . Portal hypertension with esophageal varices (HCC) 09/02/2014  . Portal hypertensive gastropathy (HCC) 09/02/2014  . Thrombocytopenia (HCC) 09/02/2014  . Upper GI bleed 08/31/2014    Patient Active Problem List   Diagnosis Date Noted  . Acute lower UTI 02/12/2020  . Frequent falls   . Nausea & vomiting 02/11/2020  . Acute hepatic encephalopathy 02/11/2020  . Acute liver failure with hepatic coma (HCC)   . Malnutrition of moderate degree 08/17/2018  . Accelerated junctional rhythm 08/17/2018  . Hypokalemia 08/16/2018  . Hypomagnesemia 08/16/2018  . Hepatic encephalopathy (HCC) 08/15/2018  . Peripheral neuropathy 08/02/2018  . Ascites due to alcoholic cirrhosis (HCC) 05/19/2018  . Depression  12/18/2016  . Testosterone deficiency in male 08/11/2016  . Inguinal hernia 02/28/2016  . Rectal bleed 01/24/2016  . Umbilical hernia 01/24/2016  . Ascites 01/24/2016  . Tobacco abuse 01/24/2016  . Gynecomastia, male 01/24/2016  . Esophageal varices (HCC) 09/02/2014  . Portal hypertension with esophageal varices (HCC) 09/02/2014  . Hiatal hernia 09/02/2014  . Leukopenia 09/02/2014  . Thrombocytopenia (HCC) 09/02/2014  . Alcoholic cirrhosis of liver without ascites [facility])     Past Surgical History:  Procedure Laterality Date  . ESOPHAGOGASTRODUODENOSCOPY Left 09/01/2014   Procedure: ESOPHAGOGASTRODUODENOSCOPY (EGD);  Surgeon: Charna Elizabeth, MD;  Location: [facility] ENDOSCOPY;  Service: Endoscopy;  Laterality: Left;  . EYE MUSCLE SURGERY Left 1967   "lazy eye"  . IR PARACENTESIS  03/16/2018  . IR PARACENTESIS  03/29/2018  . IR PARACENTESIS  04/19/2018  . IR PARACENTESIS  04/27/2018  . IR PARACENTESIS  05/10/2018  . IR PARACENTESIS  05/19/2018  . IR PARACENTESIS  06/11/2018  . IR PARACENTESIS  06/29/2018  . IR PARACENTESIS  07/16/2018  . IR PARACENTESIS  08/03/2018  . IR PARACENTESIS  08/23/2018  . IR PARACENTESIS  09/17/2018  . IR PARACENTESIS  10/19/2018  . IR PARACENTESIS  10/29/2018  . IR PARACENTESIS  11/05/2018  . IR PARACENTESIS  11/12/2018  . IR PARACENTESIS  11/19/2018  . IR PARACENTESIS  12/23/2018  . IR PARACENTESIS  05/20/2019  . IR RADIOLOGIST  EVAL & MGMT  05/13/2018  . IR RADIOLOGIST EVAL & MGMT  06/29/2018  . IR RADIOLOGIST EVAL & MGMT  12/28/2018  . IR RADIOLOGIST EVAL & MGMT  08/03/2019  . IR TIPS  05/19/2018  . RADIOLOGY WITH ANESTHESIA N/A 05/19/2018   Procedure: TIPS;  Surgeon: Simonne Come, MD;  Location[facility] OR;  Service: Radiology;  Laterality: N/A;       Family History  Problem Relation Age of Onset  . Leukemia Father        acute myloid   . Hyperlipidemia Father   . Hypertension Father   . Colon cancer Neg Hx   . Esophageal cancer Neg Hx   . Pancreatic cancer Neg Hx   .  Stomach cancer Neg Hx   . Liver disease Neg Hx     Social History   Tobacco Use  . Smoking status: Current Every Day Smoker    Packs/day: 1.00    Years: 43.00    Pack years: 43.00    Types: Cigarettes  . Smokeless tobacco: Never Used  Substance Use Topics  . Alcohol use: No    Comment: 09/02/2018 "no alcohol since 01/23/16"  . Drug use: Yes    Frequency: 5.0 times per week    Types: Marijuana    Comment: occ use     Home Medications Prior to Admission medications   Medication Sig Start Date End Date Taking? Authorizing Provider  aMILoride (MIDAMOR) 5 MG tablet TAKE 4 TABLETS BY MOUTH EVERY DAY Patient taking differently: Take 20 mg by mouth daily.  01/10/20  Yes Danis, Andreas Blower, MD  buPROPion (WELLBUTRIN SR) 100 MG 12 hr tablet TAKE 1 TABLET BY MOUTH EVERY DAY Patient taking differently: Take 100 mg by mouth daily.  09/29/19  Yes Newlin, Odette Horns, MD  FLUoxetine (PROZAC) 40 MG capsule TAKE 1 CAPSULE BY MOUTH EVERY DAY Patient taking differently: Take 40 mg by mouth daily.  01/12/20  Yes [name], MD  furosemide (LASIX) 80 MG tablet Take 1 tablet (80 mg total) by mouth daily. 02/15/20  Yes Rodolph Bong, MD  gabapentin (NEURONTIN) 300 MG capsule TAKE 2 CAPSULES (600 MG TOTAL) BY MOUTH AT BEDTIME. 01/12/20  Yes Newlin, Enobong, MD  lactulose (CHRONULAC) 10 GM/15ML solution Take 45 mLs (30 g total) by mouth 4 (four) times daily. Patient taking differently: Take 30 g by mouth in the morning, at noon, in the evening, and at bedtime.  10/04/19  Yes Danis, Andreas Blower, MD  midodrine (PROAMATINE) 5 MG tablet Take 1 tablet (5 mg total) by mouth 3 (three) times daily. 9a, 2p, 9p pt has been taking a whole tablet 3 times a day Patient taking differently: Take 5 mg by mouth 3 (three) times daily.  08/29/19  Yes Danis, Andreas Blower, MD  ondansetron (ZOFRAN) 4 MG tablet Take 1 tablet (4 mg total) by mouth every 8 (eight) hours as needed for nausea or vomiting. 08/29/19  Yes [name],  MD  oxyCODONE (OXY IR/ROXICODONE) 5 MG immediate release tablet Take 5 mg by mouth every 6 (six) hours as needed for severe pain.   Yes [provider]  potassium chloride SA (KLOR-CON M20) 20 MEQ tablet Take 2 tablets (40 mEq total) by mouth daily. 02/15/20  Yes Rodolph Bong, MD  rifaximin (XIFAXAN) 550 MG TABS tablet Take 1 tablet (550 mg total) by mouth 2 (two) times daily. 06/29/19  Yes Danis, Andreas Blower, MD    Allergies    Latex  Review of Systems   Review of Systems  All other systems reviewed and are negative.   Physical Exam Updated Vital Signs BP 118/64 (BP Location: Right Arm)   Pulse 81   Temp 98.1 F (36.7 C) (Oral)   Resp 19   SpO2 95%   Physical Exam Vitals and nursing note reviewed.  Constitutional:      Appearance: He is well-developed. He is not toxic-appearing or diaphoretic.  HENT:     Head: Normocephalic and atraumatic.     Right Ear: External ear normal.     Left Ear: External ear normal.  Eyes:     General: No scleral icterus.       Right eye: No discharge.        Left eye: No discharge.     Conjunctiva/sclera: Conjunctivae normal.  Neck:     Trachea: No tracheal deviation.  Cardiovascular:     Rate and Rhythm: Normal rate and regular rhythm.  Pulmonary:     Effort: Pulmonary effort is normal. No respiratory distress.     Breath sounds: Normal breath sounds. No stridor. No wheezing or rales.     Comments: occsnl cough Abdominal:     General: Bowel sounds are normal. There is no distension.     Palpations: Abdomen is soft.     Tenderness: There is no abdominal tenderness. There is no guarding or rebound.     Comments: No fluid wave  Musculoskeletal:        General: No tenderness.     Cervical back: Neck supple.  Skin:    General: Skin is warm and dry.     Findings: No rash.     Comments: Bruising on skin  Neurological:     Cranial Nerves: No cranial nerve deficit (no facial droop, extraocular movements intact, ).     Sensory:  No sensory deficit.     Motor: Tremor present. No abnormal muscle tone or seizure activity.     Comments: Patient will answer questions but not consistently, he does follow some commands but his responses are slowed     ED Results / Procedures / Treatments   Labs (all labs ordered are listed, but only abnormal results are displayed) Labs Reviewed  COMPREHENSIVE METABOLIC PANEL - Abnormal; Notable for the following components:      Result Value   Sodium 133 (*)    Chloride 97 (*)    Glucose, Bld 106 (*)    Total Protein 6.2 (*)    Albumin 2.2 (*)    AST 105 (*)    ALT 62 (*)    Alkaline Phosphatase 203 (*)    Total Bilirubin 5.5 (*)    All other components within normal limits  PROTIME-INR - Abnormal; Notable for the following components:   Prothrombin Time 19.7 (*)    INR 1.7 (*)    All other components within normal limits  AMMONIA - Abnormal; Notable for the following components:   Ammonia 56 (*)    All other components within normal limits  URINE CULTURE  RESPIRATORY PANEL BY RT PCR (FLU A&B, COVID)  ETHANOL  RAPID URINE DRUG SCREEN, HOSP PERFORMED  CBC WITH DIFFERENTIAL/PLATELET  URINALYSIS, COMPLETE (UACMP) WITH MICROSCOPIC  CBG MONITORING, ED    EKG None  Radiology CT HEAD WO CONTRAST  Result Date: 02/24/2020 CLINICAL DATA:  Altered mental status. EXAM: CT HEAD WITHOUT CONTRAST TECHNIQUE: Contiguous axial images were obtained from the base of the skull through the vertex without intravenous contrast. COMPARISON:  None. FINDINGS: Brain: No evidence of acute infarction, hemorrhage, hydrocephalus, extra-axial collection or mass lesion/mass effect. Vascular: No hyperdense vessel or unexpected calcification. Skull: Normal. Negative for fracture or focal lesion. Sinuses/Orbits: No acute finding. Other: None. IMPRESSION: No acute findings. Electronically Signed   By: Marin Olp M.D.   On: 02/24/2020 15:48   DG Chest Portable 1 View  Result Date: 02/24/2020 CLINICAL  DATA:  Cough, high ammonia levels EXAM: PORTABLE CHEST 1 VIEW COMPARISON:  Radiograph 09/02/2018 FINDINGS: There are bilateral streaky basilar opacities, with some more patchy opacities seen in the medial right lung base as well. No pneumothorax or visible effusion. No convincing features of edema. The cardiomediastinal contours are unremarkable. No acute osseous or soft tissue abnormality. Telemetry leads overlie the chest. IMPRESSION: Bibasilar streaky opacities favor atelectasis. More patchy opacity in the right lung base could reflect further volume loss, early infectious consolidation or sequela of aspiration. Electronically Signed   By: Lovena Le M.D.   On: 02/24/2020 15:28    Procedures Procedures (including critical care time)  Medications Ordered in ED Medications  lactulose (CHRONULAC) 10 GM/15ML solution 45 g (has no administration in time range)    ED Course  I have reviewed the triage vital signs and the nursing notes.  Pertinent labs & imaging results that were available during my care of the patient were reviewed by me and considered in my medical decision making (see chart for details).  Clinical Course as of Feb 23 1658  Fri Feb 24, 2020  1654 Labs reviewed.  Ammonia level elevated compared to recent   [JK]  1655 LFTs and bilirubin elevated.  Similar to previous values   [JK]    Clinical Course User Index [JK] Dorie Rank, MD   MDM Rules/Calculators/A&P                      Patient presented to the ED for evaluation of altered mental status.  Patient does have history of hepatic encephalopathy.  Patient is currently in a nursing home because of his alcoholic cirrhosis.  Patient does appear confused.  Chest x-ray shows the possibility of atelectasis versus pneumonia.  Head CT does not show any acute findings.  Laboratory tests are still pending.  We will give patient a dose of lactulose  Care turned over to Dr Wilson Singer to follow up on labs, dispo Final Clinical  Impression(s) / ED Diagnoses Final diagnoses:  Hepatic encephalopathy [facility])      Dorie Rank, MD 02/24/20 1659

## 2020-02-24 NOTE — ED Triage Notes (Signed)
Pt brought in by EMS from [facility] with high ammonia levels per facility. Facility reports chronic encephalopathy.

## 2020-02-24 NOTE — ED Notes (Signed)
Posey pad placed

## 2020-02-24 NOTE — ED Notes (Signed)
Pt had medium, soft BM. Pt assisted with urinal. Pt cleaned and provided with peri care. New linens provided. Call bell within reach.

## 2020-02-26 LAB — URINE CULTURE: Culture: NO GROWTH

## 2020-02-28 ENCOUNTER — Encounter: Payer: Self-pay | Admitting: Internal Medicine

## 2020-02-28 ENCOUNTER — Non-Acute Institutional Stay: Payer: Medicaid Other | Admitting: Internal Medicine

## 2020-02-28 ENCOUNTER — Other Ambulatory Visit: Payer: Self-pay

## 2020-02-28 DIAGNOSIS — Z7189 Other specified counseling: Secondary | ICD-10-CM

## 2020-02-28 DIAGNOSIS — Z515 Encounter for palliative care: Secondary | ICD-10-CM

## 2020-02-28 NOTE — Progress Notes (Signed)
April 13th, 2021 [facility] Palliative Care Consult Note Telephone: [phone]  Fax: [phone]   PATIENT NAME: Kenneth [name] DOB: 07/22/1961 MRN: [id number] [address], [place] [phone] [facility] [address] call to make sure next script.  [facility]    PRIMARY CARE PROVIDER:   [name], MD [address] [place],  [place] [address]  In person visit 4/20. Give him a "head up" [name]. Enjoys yogert. Apples/oranges, protein sandwich. Mostly emesis.    REFERRING PROVIDER:  Dr. Charlie Pitter ([facility])   RESPONSIBLE PARTY: (son) Kenneth [name] [phone]. (ex-wife) Kenneth [name] [facility] [phone]   ASSESSMENT / RECOMMENDATIONS:  1. Advance Care Planning: A. Directives:  Discussed with only son Kenneth [name]. Kenneth [name] desires no cardiopulmonary resuscitation in the event of a cardiopulmonary arrest. I completed a DNR form and placed in patient's facility record. Uploaded into [facility] EMR. B. Goals of Care: Spoke with son Kenneth [name], who desires continued medical care/intervention until patient's siblings have an opportunity to visit this weekend. Kenneth [name]'s hope is that his dad will survive to Kenneth [name]'s birthday on the 31st of this month, and maybe even to patient's own birthday end of June but realizes this might not be realistic. Kenneth [name] would be amenable to initiation of Hospice services after siblings visit this weekend, if marked decline.   2. Symptom Management: A. Continues confusion 2/2/Hepatic Encephalopathy. Progressive inability to process his thoughts. He can follow simple commands and transiently maintain eye contact. Very poor short-term memory but able to tell his sibling's names. Oriented to self only. Staff report patient pockets his food and needs encouragement and reminders to swallow. He is taking his meds if crushed and drinking his Lactulose okay. He needs stand by guarding assist when standing. Too unsteady on his feet to ambulate  though he surprised nsg staff yesterday by self-transferring and walking across the hallway. Otherwise confined to wheelchair but can still foot push himself about a little. Last fall about a week ago. He is dependent for all ADLs. Incontinent of bowel and bladder. No evidence of ascites (past ascites resolved after TIPS mid early 2020). Resolution of LE edema  Most recent weight was 143.4lbs, down 10 lbs over the last 2 months. At a height of 5'7" his BMI was 24kg/m2.    3. Family Supports: Transferred to [facility] mis April. One son Kenneth [name]. Patient's ex-wife Kenneth [name] (who works at [employer]) on good terms.    4. Palliative Care F/U: Kenneth [name] will reach out to me next week after patient's siblings visit.   I spent 60 minutes providing this consultation from 2pm to 3pm. More than 50% of the time in this consultation was spent coordinating communication.    HISTORY OF PRESENT ILLNESS:  Kenneth [name] is a 59 y.o. male with alcoholic cirrhosis of the liver with ascites, portal HTN (ascites resolved s/p TIPS last year), hepatic encephalopathy, esophageal varices (without bleed), hypotension (midodrine) and thrombocytopenia.  02/24/2020: ER Mental status changes d/t hepatic encephalopathy. Na 133, alb 2.2, bili: 5.5, PT 19.7, INR 1.7, ammonia 56 4/16-4/20/2021: Hospitalized hepatic encephalopathy, emesis unable to take lactulose and rifaximin. UTI, thrombocytopenia 2/2 cirrhosis. Discharged to SNF.   This is a f/u Palliative Care visit from 01/20/2020.   CODE STATUS: Full Code   HOSPICE ELIGIBILITY/DIAGNOSIS: Yes/end stage liver disease  PAST MEDICAL HISTORY:  Past Medical History:  Diagnosis Date  . Alcohol abuse   . Anxiety   . Arthritis    "  never diagnosis" (09/02/2018)  . Ascites   . Blood clot in vein   . Cirrhosis ([place])   . Depression   . Dyspnea    Due to ascites  . Esophageal varices ([place]) 09/02/2014  . H/O [facility] spotted fever    age 31 or 2  .  Hernia, inguinal    right  . Hiatal hernia 09/02/2014  . Liver cirrhosis ([place])   . Polysubstance abuse ([place])   . Portal hypertension with esophageal varices (HCC) 09/02/2014  . Portal hypertensive gastropathy ([place]) 09/02/2014  . Thrombocytopenia ([place]) 09/02/2014  . Upper GI bleed 08/31/2014    SOCIAL HX:  Social History   Tobacco Use  . Smoking status: Current Every Day Smoker    Packs/day: 1.00    Years: 43.00    Pack years: 43.00    Types: Cigarettes  . Smokeless tobacco: Never Used  Substance Use Topics  . Alcohol use: No    Comment: 09/02/2018 "no alcohol since 01/23/16"    ALLERGIES:  Allergies  Allergen Reactions  . Latex Swelling and Rash     PERTINENT MEDICATIONS:  Outpatient Encounter Medications as of 02/28/2020  Medication Sig  . aMILoride (MIDAMOR) 5 MG tablet TAKE 4 TABLETS BY MOUTH EVERY DAY (Patient taking differently: Take 20 mg by mouth daily. )  . buPROPion (WELLBUTRIN SR) 100 MG 12 hr tablet TAKE 1 TABLET BY MOUTH EVERY DAY (Patient taking differently: Take 100 mg by mouth daily. )  . FLUoxetine (PROZAC) 40 MG capsule TAKE 1 CAPSULE BY MOUTH EVERY DAY (Patient taking differently: Take 40 mg by mouth daily. )  . furosemide (LASIX) 80 MG tablet Take 1 tablet (80 mg total) by mouth daily.  Marland Kitchen gabapentin (NEURONTIN) 300 MG capsule TAKE 2 CAPSULES (600 MG TOTAL) BY MOUTH AT BEDTIME.  Marland Kitchen lactulose (CHRONULAC) 10 GM/15ML solution Take 60 mLs (40 g total) by mouth 4 (four) times daily.  . midodrine (PROAMATINE) 5 MG tablet Take 1 tablet (5 mg total) by mouth 3 (three) times daily. 9a, 2p, 9p pt has been taking a whole tablet 3 times a day (Patient taking differently: Take 5 mg by mouth 3 (three) times daily. )  . ondansetron (ZOFRAN) 4 MG tablet Take 1 tablet (4 mg total) by mouth every 8 (eight) hours as needed for nausea or vomiting.  Marland Kitchen oxyCODONE (OXY IR/ROXICODONE) 5 MG immediate release tablet Take 5 mg by mouth every 6 (six) hours as needed for severe pain.  . potassium  chloride SA (KLOR-CON M20) 20 MEQ tablet Take 2 tablets (40 mEq total) by mouth daily.  . rifaximin (XIFAXAN) 550 MG TABS tablet Take 1 tablet (550 mg total) by mouth 2 (two) times daily.   No facility-administered encounter medications on file as of 02/28/2020.    PHYSICAL EXAM:   General: NAD, frail appearing, thin. Slow thought process. Pleasant Cardiovascular: regular rate and rhythm Pulmonary: clear ant fields Abdomen: soft, nontender, soft Extremities: no pedal edema. Asterixis bilateral UEs Generalized weakness.  Very poor short-term memory  Julianne Handler, NP

## 2020-03-27 DEATH — deceased

## 2020-04-12 ENCOUNTER — Ambulatory Visit: Payer: Medicaid Other | Admitting: Gastroenterology
